# Patient Record
Sex: Female | Born: 1965 | Race: White | Hispanic: No | Marital: Married | Smoking: Former smoker
Health system: Southern US, Community
[De-identification: ages and names within clinical notes are randomized; demographics above are authoritative.]

## PROBLEM LIST (undated history)

## (undated) DIAGNOSIS — R0602 Shortness of breath: Secondary | ICD-10-CM

## (undated) DIAGNOSIS — T7840XA Allergy, unspecified, initial encounter: Secondary | ICD-10-CM

## (undated) DIAGNOSIS — K859 Acute pancreatitis without necrosis or infection, unspecified: Secondary | ICD-10-CM

## (undated) DIAGNOSIS — F329 Major depressive disorder, single episode, unspecified: Secondary | ICD-10-CM

## (undated) DIAGNOSIS — M549 Dorsalgia, unspecified: Secondary | ICD-10-CM

## (undated) DIAGNOSIS — R000 Tachycardia, unspecified: Secondary | ICD-10-CM

## (undated) DIAGNOSIS — F419 Anxiety disorder, unspecified: Secondary | ICD-10-CM

## (undated) DIAGNOSIS — J45909 Unspecified asthma, uncomplicated: Secondary | ICD-10-CM

## (undated) DIAGNOSIS — M1990 Unspecified osteoarthritis, unspecified site: Secondary | ICD-10-CM

## (undated) DIAGNOSIS — M2550 Pain in unspecified joint: Secondary | ICD-10-CM

## (undated) DIAGNOSIS — R0600 Dyspnea, unspecified: Secondary | ICD-10-CM

## (undated) DIAGNOSIS — E785 Hyperlipidemia, unspecified: Secondary | ICD-10-CM

## (undated) DIAGNOSIS — N83209 Unspecified ovarian cyst, unspecified side: Secondary | ICD-10-CM

## (undated) DIAGNOSIS — H269 Unspecified cataract: Secondary | ICD-10-CM

## (undated) DIAGNOSIS — Z87442 Personal history of urinary calculi: Secondary | ICD-10-CM

## (undated) DIAGNOSIS — R002 Palpitations: Secondary | ICD-10-CM

## (undated) DIAGNOSIS — Z9889 Other specified postprocedural states: Secondary | ICD-10-CM

## (undated) DIAGNOSIS — L409 Psoriasis, unspecified: Secondary | ICD-10-CM

## (undated) DIAGNOSIS — I10 Essential (primary) hypertension: Secondary | ICD-10-CM

## (undated) DIAGNOSIS — K589 Irritable bowel syndrome without diarrhea: Secondary | ICD-10-CM

## (undated) DIAGNOSIS — N6019 Diffuse cystic mastopathy of unspecified breast: Secondary | ICD-10-CM

## (undated) DIAGNOSIS — K219 Gastro-esophageal reflux disease without esophagitis: Secondary | ICD-10-CM

## (undated) DIAGNOSIS — J449 Chronic obstructive pulmonary disease, unspecified: Secondary | ICD-10-CM

## (undated) DIAGNOSIS — R112 Nausea with vomiting, unspecified: Secondary | ICD-10-CM

## (undated) DIAGNOSIS — Z8719 Personal history of other diseases of the digestive system: Secondary | ICD-10-CM

## (undated) DIAGNOSIS — L4050 Arthropathic psoriasis, unspecified: Principal | ICD-10-CM

## (undated) DIAGNOSIS — E039 Hypothyroidism, unspecified: Secondary | ICD-10-CM

## (undated) DIAGNOSIS — F32A Depression, unspecified: Secondary | ICD-10-CM

## (undated) HISTORY — DX: Chronic obstructive pulmonary disease, unspecified: J44.9

## (undated) HISTORY — DX: Unspecified osteoarthritis, unspecified site: M19.90

## (undated) HISTORY — DX: Hypothyroidism, unspecified: E03.9

## (undated) HISTORY — PX: TUBAL LIGATION: SHX77

## (undated) HISTORY — PX: EYE SURGERY: SHX253

## (undated) HISTORY — DX: Allergy, unspecified, initial encounter: T78.40XA

## (undated) HISTORY — DX: Essential (primary) hypertension: I10

## (undated) HISTORY — DX: Shortness of breath: R06.02

## (undated) HISTORY — PX: PARTIAL HYSTERECTOMY: SHX80

## (undated) HISTORY — DX: Gastro-esophageal reflux disease without esophagitis: K21.9

## (undated) HISTORY — DX: Tachycardia, unspecified: R00.0

## (undated) HISTORY — DX: Diffuse cystic mastopathy of unspecified breast: N60.19

## (undated) HISTORY — PX: ANKLE RECONSTRUCTION: SHX1151

## (undated) HISTORY — DX: Palpitations: R00.2

## (undated) HISTORY — DX: Unspecified ovarian cyst, unspecified side: N83.209

## (undated) HISTORY — PX: COLONOSCOPY: SHX174

## (undated) HISTORY — DX: Dorsalgia, unspecified: M54.9

## (undated) HISTORY — PX: POLYPECTOMY: SHX149

## (undated) HISTORY — DX: Acute pancreatitis without necrosis or infection, unspecified: K85.90

## (undated) HISTORY — DX: Hyperlipidemia, unspecified: E78.5

## (undated) HISTORY — DX: Anxiety disorder, unspecified: F41.9

## (undated) HISTORY — PX: KNEE SURGERY: SHX244

## (undated) HISTORY — DX: Arthropathic psoriasis, unspecified: L40.50

## (undated) HISTORY — PX: NASAL SEPTUM SURGERY: SHX37

## (undated) HISTORY — DX: Pain in unspecified joint: M25.50

## (undated) HISTORY — PX: ABDOMINAL HYSTERECTOMY: SHX81

## (undated) HISTORY — PX: FRACTURE SURGERY: SHX138

## (undated) HISTORY — DX: Psoriasis, unspecified: L40.9

## (undated) HISTORY — PX: NISSEN FUNDOPLICATION: SHX2091

## (undated) HISTORY — PX: CARPAL TUNNEL RELEASE: SHX101

## (undated) HISTORY — PX: OTHER SURGICAL HISTORY: SHX169

## (undated) HISTORY — PX: UPPER GASTROINTESTINAL ENDOSCOPY: SHX188

## (undated) HISTORY — DX: Unspecified cataract: H26.9

## (undated) HISTORY — DX: Irritable bowel syndrome, unspecified: K58.9

---

## 1998-01-25 ENCOUNTER — Encounter: Admission: RE | Admit: 1998-01-25 | Discharge: 1998-04-25 | Payer: Self-pay | Admitting: General Surgery

## 2000-05-30 ENCOUNTER — Observation Stay (HOSPITAL_COMMUNITY): Admission: EM | Admit: 2000-05-30 | Discharge: 2000-05-31 | Payer: Self-pay | Admitting: Emergency Medicine

## 2000-05-30 ENCOUNTER — Encounter: Payer: Self-pay | Admitting: Family Medicine

## 2000-05-31 ENCOUNTER — Encounter: Payer: Self-pay | Admitting: *Deleted

## 2000-10-11 ENCOUNTER — Encounter: Payer: Self-pay | Admitting: Gastroenterology

## 2000-10-11 ENCOUNTER — Encounter: Admission: RE | Admit: 2000-10-11 | Discharge: 2000-10-11 | Payer: Self-pay | Admitting: Gastroenterology

## 2001-03-04 ENCOUNTER — Encounter: Admission: RE | Admit: 2001-03-04 | Discharge: 2001-03-04 | Payer: Self-pay | Admitting: Family Medicine

## 2001-03-04 ENCOUNTER — Encounter: Payer: Self-pay | Admitting: Family Medicine

## 2001-04-24 ENCOUNTER — Encounter (INDEPENDENT_AMBULATORY_CARE_PROVIDER_SITE_OTHER): Payer: Self-pay | Admitting: *Deleted

## 2001-04-24 ENCOUNTER — Ambulatory Visit (HOSPITAL_COMMUNITY): Admission: RE | Admit: 2001-04-24 | Discharge: 2001-04-24 | Payer: Self-pay | Admitting: Gastroenterology

## 2001-04-29 ENCOUNTER — Encounter: Admission: RE | Admit: 2001-04-29 | Discharge: 2001-04-29 | Payer: Self-pay | Admitting: Gastroenterology

## 2001-04-29 ENCOUNTER — Encounter: Payer: Self-pay | Admitting: Gastroenterology

## 2001-05-08 ENCOUNTER — Encounter: Payer: Self-pay | Admitting: Family Medicine

## 2001-05-08 ENCOUNTER — Encounter: Admission: RE | Admit: 2001-05-08 | Discharge: 2001-05-08 | Payer: Self-pay | Admitting: Family Medicine

## 2001-05-13 ENCOUNTER — Encounter: Admission: RE | Admit: 2001-05-13 | Discharge: 2001-05-13 | Payer: Self-pay | Admitting: Family Medicine

## 2001-05-13 ENCOUNTER — Encounter: Payer: Self-pay | Admitting: Family Medicine

## 2001-06-10 ENCOUNTER — Encounter (INDEPENDENT_AMBULATORY_CARE_PROVIDER_SITE_OTHER): Payer: Self-pay | Admitting: *Deleted

## 2001-06-10 ENCOUNTER — Ambulatory Visit (HOSPITAL_COMMUNITY): Admission: RE | Admit: 2001-06-10 | Discharge: 2001-06-10 | Payer: Self-pay | Admitting: Gastroenterology

## 2001-07-24 ENCOUNTER — Encounter (INDEPENDENT_AMBULATORY_CARE_PROVIDER_SITE_OTHER): Payer: Self-pay | Admitting: *Deleted

## 2001-07-24 ENCOUNTER — Inpatient Hospital Stay (HOSPITAL_COMMUNITY): Admission: RE | Admit: 2001-07-24 | Discharge: 2001-07-26 | Payer: Self-pay | Admitting: *Deleted

## 2001-10-30 ENCOUNTER — Encounter: Admission: RE | Admit: 2001-10-30 | Discharge: 2001-10-30 | Payer: Self-pay | Admitting: Cardiology

## 2001-11-05 ENCOUNTER — Encounter: Admission: RE | Admit: 2001-11-05 | Discharge: 2001-11-05 | Payer: Self-pay

## 2002-03-28 ENCOUNTER — Encounter: Admission: RE | Admit: 2002-03-28 | Discharge: 2002-03-28 | Payer: Self-pay

## 2002-05-20 ENCOUNTER — Encounter: Admission: RE | Admit: 2002-05-20 | Discharge: 2002-05-20 | Payer: Self-pay | Admitting: Nephrology

## 2002-05-20 ENCOUNTER — Encounter: Payer: Self-pay | Admitting: Nephrology

## 2002-06-11 ENCOUNTER — Inpatient Hospital Stay (HOSPITAL_COMMUNITY): Admission: AD | Admit: 2002-06-11 | Discharge: 2002-06-12 | Payer: Self-pay | Admitting: Gastroenterology

## 2002-06-12 ENCOUNTER — Encounter (INDEPENDENT_AMBULATORY_CARE_PROVIDER_SITE_OTHER): Payer: Self-pay | Admitting: *Deleted

## 2002-06-12 ENCOUNTER — Encounter: Payer: Self-pay | Admitting: Gastroenterology

## 2002-06-23 ENCOUNTER — Inpatient Hospital Stay (HOSPITAL_COMMUNITY): Admission: AD | Admit: 2002-06-23 | Discharge: 2002-06-26 | Payer: Self-pay | Admitting: Gastroenterology

## 2002-06-24 ENCOUNTER — Encounter: Payer: Self-pay | Admitting: Gastroenterology

## 2002-06-26 ENCOUNTER — Encounter (INDEPENDENT_AMBULATORY_CARE_PROVIDER_SITE_OTHER): Payer: Self-pay | Admitting: *Deleted

## 2002-07-03 ENCOUNTER — Inpatient Hospital Stay (HOSPITAL_COMMUNITY): Admission: AD | Admit: 2002-07-03 | Discharge: 2002-07-08 | Payer: Self-pay | Admitting: Gastroenterology

## 2002-07-04 ENCOUNTER — Encounter: Payer: Self-pay | Admitting: Gastroenterology

## 2002-08-08 ENCOUNTER — Encounter (INDEPENDENT_AMBULATORY_CARE_PROVIDER_SITE_OTHER): Payer: Self-pay | Admitting: *Deleted

## 2002-09-10 ENCOUNTER — Ambulatory Visit (HOSPITAL_COMMUNITY): Admission: RE | Admit: 2002-09-10 | Discharge: 2002-09-10 | Payer: Self-pay | Admitting: Internal Medicine

## 2002-09-10 ENCOUNTER — Encounter: Payer: Self-pay | Admitting: Internal Medicine

## 2002-10-29 ENCOUNTER — Encounter: Payer: Self-pay | Admitting: Nephrology

## 2002-10-29 ENCOUNTER — Encounter: Admission: RE | Admit: 2002-10-29 | Discharge: 2002-10-29 | Payer: Self-pay | Admitting: Nephrology

## 2003-01-23 ENCOUNTER — Encounter: Payer: Self-pay | Admitting: Internal Medicine

## 2003-01-23 ENCOUNTER — Ambulatory Visit (HOSPITAL_COMMUNITY): Admission: RE | Admit: 2003-01-23 | Discharge: 2003-01-23 | Payer: Self-pay | Admitting: Internal Medicine

## 2003-03-05 ENCOUNTER — Encounter: Admission: RE | Admit: 2003-03-05 | Discharge: 2003-03-05 | Payer: Self-pay | Admitting: Gastroenterology

## 2003-03-05 ENCOUNTER — Encounter: Payer: Self-pay | Admitting: Gastroenterology

## 2003-03-13 ENCOUNTER — Encounter: Payer: Self-pay | Admitting: Gastroenterology

## 2003-03-13 ENCOUNTER — Encounter: Admission: RE | Admit: 2003-03-13 | Discharge: 2003-03-13 | Payer: Self-pay | Admitting: Gastroenterology

## 2003-03-20 ENCOUNTER — Encounter: Admission: RE | Admit: 2003-03-20 | Discharge: 2003-03-20 | Payer: Self-pay | Admitting: Nephrology

## 2003-03-20 ENCOUNTER — Encounter: Payer: Self-pay | Admitting: Nephrology

## 2003-05-25 ENCOUNTER — Emergency Department (HOSPITAL_COMMUNITY): Admission: EM | Admit: 2003-05-25 | Discharge: 2003-05-25 | Payer: Self-pay

## 2003-06-03 ENCOUNTER — Other Ambulatory Visit: Admission: RE | Admit: 2003-06-03 | Discharge: 2003-06-03 | Payer: Self-pay | Admitting: Obstetrics and Gynecology

## 2003-07-16 ENCOUNTER — Encounter (INDEPENDENT_AMBULATORY_CARE_PROVIDER_SITE_OTHER): Payer: Self-pay | Admitting: Specialist

## 2003-07-16 ENCOUNTER — Observation Stay (HOSPITAL_COMMUNITY): Admission: RE | Admit: 2003-07-16 | Discharge: 2003-07-17 | Payer: Self-pay | Admitting: Obstetrics and Gynecology

## 2003-11-02 ENCOUNTER — Encounter: Admission: RE | Admit: 2003-11-02 | Discharge: 2003-11-02 | Payer: Self-pay | Admitting: Nephrology

## 2003-12-10 ENCOUNTER — Encounter: Admission: RE | Admit: 2003-12-10 | Discharge: 2003-12-10 | Payer: Self-pay | Admitting: Gastroenterology

## 2004-04-01 ENCOUNTER — Encounter (INDEPENDENT_AMBULATORY_CARE_PROVIDER_SITE_OTHER): Payer: Self-pay | Admitting: *Deleted

## 2004-04-01 ENCOUNTER — Encounter (INDEPENDENT_AMBULATORY_CARE_PROVIDER_SITE_OTHER): Payer: Self-pay | Admitting: Specialist

## 2004-04-01 ENCOUNTER — Inpatient Hospital Stay (HOSPITAL_COMMUNITY): Admission: EM | Admit: 2004-04-01 | Discharge: 2004-04-05 | Payer: Self-pay | Admitting: Emergency Medicine

## 2004-04-05 ENCOUNTER — Encounter (INDEPENDENT_AMBULATORY_CARE_PROVIDER_SITE_OTHER): Payer: Self-pay | Admitting: *Deleted

## 2004-04-06 ENCOUNTER — Ambulatory Visit (HOSPITAL_COMMUNITY): Admission: RE | Admit: 2004-04-06 | Discharge: 2004-04-06 | Payer: Self-pay | Admitting: Pediatrics

## 2004-04-06 ENCOUNTER — Encounter (INDEPENDENT_AMBULATORY_CARE_PROVIDER_SITE_OTHER): Payer: Self-pay | Admitting: *Deleted

## 2004-04-13 ENCOUNTER — Encounter (INDEPENDENT_AMBULATORY_CARE_PROVIDER_SITE_OTHER): Payer: Self-pay | Admitting: Specialist

## 2004-04-13 ENCOUNTER — Encounter (INDEPENDENT_AMBULATORY_CARE_PROVIDER_SITE_OTHER): Payer: Self-pay | Admitting: *Deleted

## 2004-04-13 ENCOUNTER — Ambulatory Visit (HOSPITAL_COMMUNITY): Admission: RE | Admit: 2004-04-13 | Discharge: 2004-04-13 | Payer: Self-pay | Admitting: Gastroenterology

## 2004-04-18 ENCOUNTER — Encounter (INDEPENDENT_AMBULATORY_CARE_PROVIDER_SITE_OTHER): Payer: Self-pay | Admitting: *Deleted

## 2004-04-18 ENCOUNTER — Ambulatory Visit (HOSPITAL_COMMUNITY): Admission: RE | Admit: 2004-04-18 | Discharge: 2004-04-18 | Payer: Self-pay | Admitting: Gastroenterology

## 2004-06-03 ENCOUNTER — Other Ambulatory Visit: Admission: RE | Admit: 2004-06-03 | Discharge: 2004-06-03 | Payer: Self-pay | Admitting: Obstetrics and Gynecology

## 2004-07-04 ENCOUNTER — Encounter: Admission: RE | Admit: 2004-07-04 | Discharge: 2004-07-04 | Payer: Self-pay | Admitting: Nephrology

## 2004-08-11 ENCOUNTER — Encounter: Admission: RE | Admit: 2004-08-11 | Discharge: 2004-08-11 | Payer: Self-pay | Admitting: Nephrology

## 2004-11-16 ENCOUNTER — Encounter: Admission: RE | Admit: 2004-11-16 | Discharge: 2004-11-16 | Payer: Self-pay | Admitting: Gastroenterology

## 2004-11-16 ENCOUNTER — Encounter (INDEPENDENT_AMBULATORY_CARE_PROVIDER_SITE_OTHER): Payer: Self-pay | Admitting: *Deleted

## 2004-12-16 ENCOUNTER — Encounter (INDEPENDENT_AMBULATORY_CARE_PROVIDER_SITE_OTHER): Payer: Self-pay | Admitting: *Deleted

## 2004-12-16 ENCOUNTER — Encounter: Admission: RE | Admit: 2004-12-16 | Discharge: 2004-12-16 | Payer: Self-pay | Admitting: Nephrology

## 2004-12-30 ENCOUNTER — Encounter: Admission: RE | Admit: 2004-12-30 | Discharge: 2004-12-30 | Payer: Self-pay

## 2005-02-13 ENCOUNTER — Encounter: Admission: RE | Admit: 2005-02-13 | Discharge: 2005-02-13 | Payer: Self-pay | Admitting: Nephrology

## 2005-05-06 ENCOUNTER — Emergency Department: Payer: Self-pay | Admitting: Emergency Medicine

## 2005-05-06 ENCOUNTER — Other Ambulatory Visit: Payer: Self-pay

## 2005-09-05 ENCOUNTER — Other Ambulatory Visit: Admission: RE | Admit: 2005-09-05 | Discharge: 2005-09-05 | Payer: Self-pay | Admitting: Obstetrics and Gynecology

## 2005-10-26 ENCOUNTER — Encounter: Admission: RE | Admit: 2005-10-26 | Discharge: 2005-10-26 | Payer: Self-pay | Admitting: Nephrology

## 2005-10-26 ENCOUNTER — Encounter (INDEPENDENT_AMBULATORY_CARE_PROVIDER_SITE_OTHER): Payer: Self-pay | Admitting: *Deleted

## 2005-10-30 ENCOUNTER — Encounter: Admission: RE | Admit: 2005-10-30 | Discharge: 2005-10-30 | Payer: Self-pay | Admitting: Gastroenterology

## 2005-10-30 ENCOUNTER — Encounter (INDEPENDENT_AMBULATORY_CARE_PROVIDER_SITE_OTHER): Payer: Self-pay | Admitting: *Deleted

## 2005-11-02 ENCOUNTER — Ambulatory Visit (HOSPITAL_COMMUNITY): Admission: RE | Admit: 2005-11-02 | Discharge: 2005-11-02 | Payer: Self-pay | Admitting: Gastroenterology

## 2005-11-02 ENCOUNTER — Encounter (INDEPENDENT_AMBULATORY_CARE_PROVIDER_SITE_OTHER): Payer: Self-pay | Admitting: *Deleted

## 2005-11-06 ENCOUNTER — Encounter: Admission: RE | Admit: 2005-11-06 | Discharge: 2005-11-06 | Payer: Self-pay | Admitting: Gastroenterology

## 2005-11-06 ENCOUNTER — Encounter (INDEPENDENT_AMBULATORY_CARE_PROVIDER_SITE_OTHER): Payer: Self-pay | Admitting: *Deleted

## 2005-11-09 ENCOUNTER — Encounter: Admission: RE | Admit: 2005-11-09 | Discharge: 2005-11-09 | Payer: Self-pay | Admitting: Nephrology

## 2006-07-05 ENCOUNTER — Encounter: Admission: RE | Admit: 2006-07-05 | Discharge: 2006-07-05 | Payer: Self-pay | Admitting: Orthopedic Surgery

## 2006-09-18 ENCOUNTER — Encounter: Admission: RE | Admit: 2006-09-18 | Discharge: 2006-09-18 | Payer: Self-pay | Admitting: Nephrology

## 2006-10-02 ENCOUNTER — Encounter: Admission: RE | Admit: 2006-10-02 | Discharge: 2006-10-02 | Payer: Self-pay | Admitting: Gastroenterology

## 2006-10-02 ENCOUNTER — Encounter (INDEPENDENT_AMBULATORY_CARE_PROVIDER_SITE_OTHER): Payer: Self-pay | Admitting: *Deleted

## 2006-10-10 ENCOUNTER — Other Ambulatory Visit: Admission: RE | Admit: 2006-10-10 | Discharge: 2006-10-10 | Payer: Self-pay | Admitting: Obstetrics and Gynecology

## 2006-11-12 ENCOUNTER — Encounter: Admission: RE | Admit: 2006-11-12 | Discharge: 2006-11-12 | Payer: Self-pay | Admitting: Nephrology

## 2007-01-08 ENCOUNTER — Inpatient Hospital Stay (HOSPITAL_COMMUNITY): Admission: AD | Admit: 2007-01-08 | Discharge: 2007-01-10 | Payer: Self-pay | Admitting: Gastroenterology

## 2007-01-08 ENCOUNTER — Encounter: Admission: RE | Admit: 2007-01-08 | Discharge: 2007-01-08 | Payer: Self-pay | Admitting: Nephrology

## 2007-01-08 ENCOUNTER — Encounter (INDEPENDENT_AMBULATORY_CARE_PROVIDER_SITE_OTHER): Payer: Self-pay | Admitting: *Deleted

## 2007-01-10 ENCOUNTER — Encounter (INDEPENDENT_AMBULATORY_CARE_PROVIDER_SITE_OTHER): Payer: Self-pay | Admitting: *Deleted

## 2007-10-14 ENCOUNTER — Other Ambulatory Visit: Admission: RE | Admit: 2007-10-14 | Discharge: 2007-10-14 | Payer: Self-pay | Admitting: Obstetrics and Gynecology

## 2007-11-13 ENCOUNTER — Encounter: Admission: RE | Admit: 2007-11-13 | Discharge: 2007-11-13 | Payer: Self-pay | Admitting: Obstetrics and Gynecology

## 2008-06-05 ENCOUNTER — Ambulatory Visit: Payer: Self-pay | Admitting: Women's Health

## 2008-06-10 ENCOUNTER — Encounter: Admission: RE | Admit: 2008-06-10 | Discharge: 2008-06-10 | Payer: Self-pay | Admitting: Obstetrics and Gynecology

## 2008-10-22 ENCOUNTER — Encounter: Payer: Self-pay | Admitting: Obstetrics and Gynecology

## 2008-10-22 ENCOUNTER — Other Ambulatory Visit: Admission: RE | Admit: 2008-10-22 | Discharge: 2008-10-22 | Payer: Self-pay | Admitting: Obstetrics and Gynecology

## 2008-10-22 ENCOUNTER — Ambulatory Visit: Payer: Self-pay | Admitting: Obstetrics and Gynecology

## 2008-11-16 ENCOUNTER — Encounter: Admission: RE | Admit: 2008-11-16 | Discharge: 2008-11-16 | Payer: Self-pay | Admitting: Obstetrics and Gynecology

## 2009-06-21 ENCOUNTER — Encounter (INDEPENDENT_AMBULATORY_CARE_PROVIDER_SITE_OTHER): Payer: Self-pay | Admitting: *Deleted

## 2009-06-21 ENCOUNTER — Ambulatory Visit (HOSPITAL_COMMUNITY): Admission: RE | Admit: 2009-06-21 | Discharge: 2009-06-21 | Payer: Self-pay | Admitting: Nephrology

## 2009-08-23 ENCOUNTER — Encounter (INDEPENDENT_AMBULATORY_CARE_PROVIDER_SITE_OTHER): Payer: Self-pay | Admitting: *Deleted

## 2009-08-24 DIAGNOSIS — E039 Hypothyroidism, unspecified: Secondary | ICD-10-CM | POA: Insufficient documentation

## 2009-08-24 DIAGNOSIS — Z8719 Personal history of other diseases of the digestive system: Secondary | ICD-10-CM | POA: Insufficient documentation

## 2009-08-24 DIAGNOSIS — Z8679 Personal history of other diseases of the circulatory system: Secondary | ICD-10-CM | POA: Insufficient documentation

## 2009-08-24 DIAGNOSIS — E785 Hyperlipidemia, unspecified: Secondary | ICD-10-CM | POA: Insufficient documentation

## 2009-08-24 DIAGNOSIS — I10 Essential (primary) hypertension: Secondary | ICD-10-CM | POA: Insufficient documentation

## 2009-08-24 DIAGNOSIS — K219 Gastro-esophageal reflux disease without esophagitis: Secondary | ICD-10-CM | POA: Insufficient documentation

## 2009-08-24 DIAGNOSIS — N83209 Unspecified ovarian cyst, unspecified side: Secondary | ICD-10-CM | POA: Insufficient documentation

## 2009-08-24 DIAGNOSIS — K589 Irritable bowel syndrome without diarrhea: Secondary | ICD-10-CM | POA: Insufficient documentation

## 2009-08-24 DIAGNOSIS — M129 Arthropathy, unspecified: Secondary | ICD-10-CM | POA: Insufficient documentation

## 2009-08-24 DIAGNOSIS — K5190 Ulcerative colitis, unspecified, without complications: Secondary | ICD-10-CM | POA: Insufficient documentation

## 2009-08-24 DIAGNOSIS — Z87898 Personal history of other specified conditions: Secondary | ICD-10-CM | POA: Insufficient documentation

## 2009-08-24 DIAGNOSIS — E7849 Other hyperlipidemia: Secondary | ICD-10-CM | POA: Insufficient documentation

## 2009-08-24 DIAGNOSIS — L408 Other psoriasis: Secondary | ICD-10-CM | POA: Insufficient documentation

## 2009-08-30 ENCOUNTER — Ambulatory Visit: Payer: Self-pay | Admitting: Internal Medicine

## 2009-09-09 ENCOUNTER — Ambulatory Visit: Payer: Self-pay | Admitting: Internal Medicine

## 2009-09-14 ENCOUNTER — Encounter: Payer: Self-pay | Admitting: Internal Medicine

## 2009-09-15 ENCOUNTER — Telehealth: Payer: Self-pay | Admitting: Internal Medicine

## 2009-09-23 ENCOUNTER — Encounter: Payer: Self-pay | Admitting: Internal Medicine

## 2009-10-14 ENCOUNTER — Ambulatory Visit: Payer: Self-pay | Admitting: Internal Medicine

## 2009-10-21 ENCOUNTER — Encounter: Payer: Self-pay | Admitting: Internal Medicine

## 2009-10-27 ENCOUNTER — Other Ambulatory Visit: Admission: RE | Admit: 2009-10-27 | Discharge: 2009-10-27 | Payer: Self-pay | Admitting: Obstetrics and Gynecology

## 2009-10-27 ENCOUNTER — Ambulatory Visit: Payer: Self-pay | Admitting: Obstetrics and Gynecology

## 2009-11-03 ENCOUNTER — Encounter: Admission: RE | Admit: 2009-11-03 | Discharge: 2009-11-03 | Payer: Self-pay | Admitting: Rheumatology

## 2009-11-25 ENCOUNTER — Ambulatory Visit: Payer: Self-pay | Admitting: Internal Medicine

## 2009-11-29 ENCOUNTER — Encounter: Payer: Self-pay | Admitting: Internal Medicine

## 2009-12-06 ENCOUNTER — Encounter (HOSPITAL_COMMUNITY): Admission: RE | Admit: 2009-12-06 | Discharge: 2010-03-06 | Payer: Self-pay | Admitting: Rheumatology

## 2009-12-21 ENCOUNTER — Encounter: Payer: Self-pay | Admitting: Internal Medicine

## 2009-12-27 ENCOUNTER — Encounter: Admission: RE | Admit: 2009-12-27 | Discharge: 2009-12-27 | Payer: Self-pay | Admitting: Obstetrics and Gynecology

## 2010-01-24 ENCOUNTER — Telehealth: Payer: Self-pay | Admitting: Internal Medicine

## 2010-02-28 ENCOUNTER — Ambulatory Visit: Payer: Self-pay | Admitting: Internal Medicine

## 2010-04-06 ENCOUNTER — Encounter: Payer: Self-pay | Admitting: Internal Medicine

## 2010-04-12 ENCOUNTER — Encounter: Payer: Self-pay | Admitting: Internal Medicine

## 2010-04-14 ENCOUNTER — Encounter (HOSPITAL_COMMUNITY): Admission: RE | Admit: 2010-04-14 | Discharge: 2010-06-16 | Payer: Self-pay | Admitting: Rheumatology

## 2010-05-22 ENCOUNTER — Observation Stay (HOSPITAL_COMMUNITY): Admission: EM | Admit: 2010-05-22 | Discharge: 2010-05-22 | Payer: Self-pay | Admitting: Emergency Medicine

## 2010-06-17 ENCOUNTER — Encounter (HOSPITAL_COMMUNITY)
Admission: RE | Admit: 2010-06-17 | Discharge: 2010-08-16 | Payer: Self-pay | Source: Home / Self Care | Attending: Rheumatology | Admitting: Rheumatology

## 2010-07-07 ENCOUNTER — Encounter: Payer: Self-pay | Admitting: Internal Medicine

## 2010-08-03 ENCOUNTER — Ambulatory Visit: Admit: 2010-08-03 | Payer: Self-pay | Admitting: Internal Medicine

## 2010-08-06 ENCOUNTER — Encounter: Payer: Self-pay | Admitting: Nephrology

## 2010-08-16 NOTE — Discharge Summary (Signed)
Summary: Relapsing Pancreatitis     NAME:  [name], [name]                      ACCOUNT NO.:  [id number]   MEDICAL RECORD NO.:  [id number]                   PATIENT TYPE:  INP   LOCATION:  5522                                 FACILITY:  [facility]   PHYSICIAN:  James L. Rolla Flatten., M.D.          DATE OF BIRTH:  January 16, 1966   DATE OF ADMISSION:  07/03/2002  DATE OF DISCHARGE:  07/08/2002                                 DISCHARGE SUMMARY   ADMISSION DIAGNOSES:  1. Acute relapsing pancreatitis secondary to Imuran.  2. Ulcerative colitis with multiple extracolonic manifestations.   DISCHARGE DIAGNOSES:  1. Acute relapsing pancreatitis secondary to Imuran.  2. Ulcerative colitis with multiple extracolonic manifestations.   PERTINENT HISTORY:  A 45 year old with ulcerative colitis diagnosed in 2003.  Had been on steroids and was started on Imuran after she failed to improve  with steroid taper.  Within seven to eight days of starting the  Imuran, she  developed acute pancreatitis.  Workup including CT and ultrasound failed to  show any other obvious cause.  She was hospitalized for this and got better  and went home on clear liquids with slow increase of her diet.  She took  some bouillon and became sick and had abdominal  pain and vomiting.  Amylase  and lipase bumped back up.   MEDICATIONS ON ADMISSION:  1. Colazal.  2. Vicodin.  3. Ultram.  4. Prednisone 5 mg alternating with 7.5.  5. Levothyroxine 25 mcg daily.   PHYSICAL EXAMINATION:  Remarkable for normal heart and lungs.  Abdomen was  nondistended.  Bowel sounds were active.  There  was marked epigastric  tenderness.  For more details, please see dictated admission history and  physical.   HOSPITAL COURSE:  The patient was admitted to medical floor and kept n.p.o.  Started on IV fluids.  We went ahead and made arrangements to place a PIC  line which was placed in radiology.  She was seen by pharmacy and  arrangements were  made to start TNA.  She was followed by pharmacy and the  formula was adjusted.  TNA began to be cycled at night with the patient  allowed to have sips of clear liquids with no bouillon or any pat  whatsoever.  Arrangements were made with home health care service of the  case manager to have the TNA delivered to the house.  It would be cycled at  night.  The patient was marked improved.   DISPOSITION:  The patient will be discharged home on TNA arranged by  [facility].  She will be on clear liquids, fat free.   MEDICATIONS:  1. Prednisone daily 5 mg.  On 12/29, she will decrease to 2.5 mg on odd days     and 5 mg on even days.  2. She will take Vicodin for pain.  3.     Phenergan for nausea.  4. Protonix 40 mg daily.   See will see Dr. Oletta Lamas in the office on 1/6.  Condition is improved.                                               James L. Rolla Flatten., M.D.    [name]  D:  08/12/2002  T:  08/12/2002  Job:  [id number]   cc:   Jeneen Rinks L. Deterding, M.D.  [place] [address]  Fax: [phone]

## 2010-08-16 NOTE — Miscellaneous (Signed)
Summary: Asacol Refills  Clinical Lists Changes  Medications: Changed medication from ASACOL 400 MG TBEC (MESALAMINE) Take 2 tablets by mouth three times a day x 4 days, then increase to 3 tablets by mouth three times a day to ASACOL 400 MG TBEC (MESALAMINE) Take 3 tablets by mouth three times a day - Signed Rx of ASACOL 400 MG TBEC (MESALAMINE) Take 3 tablets by mouth three times a day;  #270 x 3;  Signed;  Entered by: [name] CMA (AAMA);  Authorized by: Lafayette Dragon MD;  Method used: Electronically to [facility] [address] [place], [place], [place]  [address], Ph: [phone], Fax: [phone]    Prescriptions: ASACOL 400 MG TBEC (MESALAMINE) Take 3 tablets by mouth three times a day  #270 x 3   Entered by:   [name] CMA ([place])   Authorized by:   Lafayette Dragon MD   Signed by:   [name] CMA ([place]) on 09/23/2009   Method used:   Electronically to        [facility] (retail)       [address]       [place], [place]  [address]       Ph: [phone]       Fax: [phone]   RxID:   [id number]

## 2010-08-16 NOTE — Assessment & Plan Note (Signed)
Summary: ulcerative colitis flare...as.   History of Present Illness Visit Type: consult Primary GI MD: [name] MD Primary Provider: n/a Requesting Provider: Mauricia Area, MD Chief Complaint: abdominal pain, intermittent diarrhea, blood and mucus in BM History of Present Illness:   [name] with ulcerative colitis diagnosed 2002 with a colonoscopy showing left-sided ulcerative colitis. She has since then had several hospitalizations and was in a remission for several years until recently when she developed crampy abdominal pain and almost daily rectal bleeding as well as frequent stools. Her last colonoscopy in September 2005 was essentially normal. Her sister has had Crohn's disease since age 86 and her mother has a history of colon polyps. This is a significant history of gastroesophageal reflux and esophageal ulcers necessitating a Nissen fundoplication in January 5456. Patient is unable to vomit as a result of the procedure. This became quite difficult when she was admitted with pancreatitis in November 2003. She had 3 subsequent hospitalizations for pancreatitis secondary to Imuran. In November 2003, a CT scan showed inflammatory changes in the tail of the pancreas. She was hospitalized for ulcerative colitis in September 2005 and started to cyclosporin for 2 years but because of renal insufficiency, the medication was discontinued. She was also tried on Humira 40 mg weekly, primarily for psoriasis, without much effect. The small bowel follow through in September 2005 was normal. Numerous CT scans of the abdomen for abdominal pain in May 2006, April 2007 and in June 2008 were all essentially negative. An MRCP in April 2007 was also negative. Patient denies using alcohol. Her gallbladder ultrasounds have been normal and her HIDA scan in December 2010 shows a 91% ejection fraction. She is currently on no medication for ulcerative colitis. She used to be on prednisone for  several years and was briefly on Imuran until she developed pancreatitis. Her weight has been stable. 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   GI Review of Systems    Reports abdominal pain, acid reflux, belching, bloating, and  heartburn.     Location of  Abdominal pain: generalized.    Denies chest pain, dysphagia with liquids, dysphagia with solids, loss of appetite, nausea, vomiting, vomiting blood, weight loss, and  weight gain.      Reports constipation, diarrhea, heme positive stool, hemorrhoids, irritable bowel syndrome, and  rectal bleeding.     Denies anal fissure, black tarry stools, change in bowel habit, diverticulosis, fecal incontinence, jaundice, light color stool, liver problems, and  rectal pain.    Current Medications (verified): 1)  Levothroid 100 Mcg Tabs (Levothyroxine Sodium) .... Take 1 Tablet By Mouth Once A Day 2)  Pravachol 40 Mg Tabs (Pravastatin Sodium) .... Take 1 Tablet By Mouth Once A Day 3)  Prozac 20 Mg Caps (Fluoxetine Hcl) .... Take 1 Tablet By Mouth Once A Day  Allergies: 1)  ! Erythromycin 2)  ! Demerol 3)  ! * Avelox 4)  ! * Imuran 5)  ! * Latex 6)  Doxycycline  Past History:  Past Medical History: Reviewed history from 08/24/2009 and no changes required. Current Problems:  IRRITABLE BOWEL SYNDROME (ICD-564.1) ARTHRITIS (ICD-716.90) TACHYCARDIA, HX OF (ICD-V12.50) FIBROCYSTIC BREAST DISEASE, HX OF (ICD-V13.9) GERD (ICD-530.81) PANCREATITIS, HX OF (ICD-V12.70) Hx of OVARIAN CYST (ICD-620.2) UNSPECIFIED HYPOTHYROIDISM (ICD-244.9) HYPERLIPIDEMIA (ICD-272.4) HYPERTENSION (ICD-401.9) PSORIASIS (ICD-696.1) ULCERATIVE COLITIS (ICD-556.9)    Past Surgical History: Reviewed history from 08/24/2009 and no changes required. 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 -quit in 2007 Alcohol Use - yes-rare Illicit Drug Use - no Daily Caffeine Use 3/day  Review of Systems       The patient complains of fatigue, night sweats, and skin rash.  The patient denies allergy/sinus, anemia, anxiety-new, arthritis/joint pain, back pain, blood in urine, breast changes/lumps, confusion, cough, coughing up blood, depression-new, fainting, fever, headaches-new, hearing problems, heart murmur, heart rhythm changes, itching, menstrual pain, muscle pains/cramps, nosebleeds, pregnancy symptoms, shortness of breath, sleeping problems, sore throat, swelling of feet/legs, swollen lymph glands, thirst - excessive, urination - excessive, urination changes/pain, urine leakage, vision changes, and voice change.         Pertinent positive and negative review of systems were noted in the above HPI. All other ROS was otherwise negative.   Vital Signs:  Patient profile:   45 year old female Height:      66 inches Weight:      182 pounds BMI:     29.48 Pulse rate:   84 / minute Pulse rhythm:   regular BP sitting:   120 / 76  (left arm) Cuff size:   regular  Vitals Entered By: Abelino Derrick CMA Deborra Medina) (August 30, 2009 1:35 PM)  Physical Exam  General:  alert, oriented and in no distress, appeared healthy and slightly cushingoid. Eyes:  PERRLA, no icterus. Mouth:  normal oral mucosa. Neck:  normal thyroid. Chest Wall:  Symmetrical;  no deformities or tenderness. Lungs:  Clear throughout to auscultation. Heart:  Regular rate and rhythm; no murmurs, rubs,  or bruits. Abdomen:  soft abdomen with some mild tenderness in the left lower quadrant and right lower  quadrant. There is no distention and there are normal bowel sounds. There is no fullness or rebound. There is no tympany. Liver edge at costal margin. Rectal:  rectal and anoscopic exam reveals friable bleeding mucosa of the rectum. There was no visible blood but stool was strongly Hemoccult positive. There were no significant hemorrhoids. Extremities:  No clubbing, cyanosis, edema or deformities noted. Skin:  psoriasis of the palms of her hands as well as of her feet. Psych:  Alert and cooperative. Normal mood and affect.   Impression & Recommendations:  Problem # 1:  ULCERATIVE COLITIS (ICD-556.9) Patient has ulcerative colitis of almost 10 years duration and is currently on no medications. She is having a  flareup presenting with bleeding, crampy abdominal pain and diarrhea. We will start her on Asacol 400 mg and slowly increase her to 3.6 g a day. We will also give her dicyclomine 20 mg 3 times a day for cramps. We may need to consider biologicals or possibly methotrexate or a short course of prednisone. We will proceed with a colonoscopy to assess the activity and the extent of the disease. Orders: Colon/Endo (Colon/Endo)  Problem # 2:  GERD (ICD-530.81) Patient is status post Nissen fundoplication with a  wrap around 50 [place]  unable to vomit.R/o gas/bloar syndrome. She is to continue on antireflux measures. Depending on the endoscopy, she may need PPIs. Orders: Colon/Endo (Colon/Endo)  Problem # 3:  ARTHRITIS (ICD-716.90) Patient has mild arthralgias possibly  due to psoriasis or due to IBD related arthropathy. She is on topical medications but currently is on no systemic treatment.  Problem # 4:  PANCREATITIS, HX OF (ICD-V12.70) Patient has a  history of pancreatitis due to Imuran requiring 3 separate hospitalizations in 2005. This is not currently active.  Problem # 5:  UNSPECIFIED HYPOTHYROIDISM (ICD-244.9) history of autoimmune thyroiditis.  Patient Instructions: 1)  low  residue diet. 2)  Dicyclomine 20 mg p.o. t.i.d. 3)  Asacol 400 mg increased to 9 tablets a day. 4)  Schedule colonoscopy with biopsies. 5)  Consider alternative treatment for ulcerative colitis such as methotrexate, Remicade or prednisone. 6)  Copy sent to : Dr Deterding 7)  The medication list was reviewed and reconciled.  All changed / newly prescribed medications were explained.  A complete medication list was provided to the patient / caregiver. Prescriptions: ASACOL 400 MG TBEC (MESALAMINE) Take 2 tablets by mouth three times a day x 4 days, then increase to 3 tablets by mouth three times a day  #120 x 1   Entered by:   [name] CMA ([place])   Authorized by:   Lafayette Dragon MD   Signed by:   [name] CMA ([place]) on 08/30/2009   Method used:   Electronically to        [facility] ([facility]ail)       [place], [place]  [address]       Ph: [phone]       Fax: [phone]   RxID:   [id number] DICYCLOMINE HCL 20 MG TABS (DICYCLOMINE HCL) Take 1 tablet by mouth three times a day  #90 x 3   Entered by:   [name] CMA ([place])   Authorized by:   Lafayette Dragon MD   Signed by:   [name] CMA ([place]) on 08/30/2009   Method used:   Electronically to        [facility] ([facility]ail)       [place], [place]  [address]       Ph: [phone]       Fax: [phone]   RxID:   [id number] DULCOLAX 5 MG  TBEC (BISACODYL) Day before procedure take 2 at 3pm and 2 at 8pm.  #4 x 0   Entered by:   [name] CMA ([place])   Authorized by:   Lafayette Dragon MD   Signed by:   [name] CMA ([place]) on 08/30/2009   Method used:   Electronically to        [facility] ([facility])       [place], [place]  [address]       Ph: [phone]       Fax: [phone]   RxID:   [id number] REGLAN 10 MG   TABS (METOCLOPRAMIDE HCL) As per prep instructions.  #2 x 0   Entered by:   [name] CMA (AAMA)   Authorized by:   Lafayette Dragon MD   Signed by:   [name] CMA ([place]) on 08/30/2009   Method used:   Electronically to        [facility] ([facility]ail)       [phone]  [place], [place]  [address]       Ph: [phone]       Fax: [phone]   RxID:   [id number] MIRALAX   POWD (POLYETHYLENE GLYCOL 3350) As per prep  instructions.  #255gm x 0   Entered by:   [name] CMA ([place])   Authorized by:   Lafayette Dragon MD   Signed by:   [name] CMA ([place]) on 08/30/2009   Method used:   Electronically to        [facility] ([facility])       [place], [place]  [address]       Ph: [phone]       Fax: [phone]   RxID:   [id number]

## 2010-08-16 NOTE — Op Note (Signed)
Summary: EGD (Dr Oletta Lamas)  NAME:  [name], [name]               ACCOUNT NO.:  [id number]   MEDICAL RECORD NO.:  [id number]          PATIENT TYPE:  AMB   LOCATION:  ENDO                         FACILITY:  [facility]   PHYSICIAN:  James L. Rolla Flatten., M.D.DATE OF BIRTH:  01-28-1966   DATE OF PROCEDURE:  DATE OF DISCHARGE:                                 OPERATIVE REPORT   PROCEDURE PERFORMED:  Esophagogastroduodenoscopy.   MEDICATIONS:  Fentanyl 100 mcg, Versed 10 mg IV, Cetacaine spray.   ENDOSCOPIST:  Joyice Faster. Oletta Lamas, M.D.   INDICATIONS FOR PROCEDURE:  Abdominal pain, mild pancreatitis, negative CT,  ultrasound.  The patient has had previous Nissen, has a multitude of  extracolonic manifestations of her ulcerative colitis.  This is done to rule  out any type of ulceration, slippage of Nissen, etc.   DESCRIPTION OF PROCEDURE:  The procedure had been explained to the patient  and consent obtained.  With the patient in left lateral decubitus position,  the Olympus scope was inserted and advanced.  The stomach was entered.  The  pylorus identified and passed.  The duodenum including the bulb and second  portion were seen well and were unremarkable.  No ulceration or  inflammation.  The pyloric channel, antrum and body were normal.  Fundus and  cardia seen well in the retroflex view.  The Nissen was seen and appeared to  be intact.  The distal proximal esophagus was seen well and was normal.  The  scope was withdrawn.  The patient tolerated the procedure well.   ASSESSMENT:  Epigastric pain with essentially negative endoscopy.  No  evidence of perforating duodenal ulcer etc.   PLAN:  Will continue supportive treatment for pancreatitis.  She will follow-  up with labs in Dr. Deterding's office next week.  We will arrange an MRCP  to look for signs of sclerosing cholangitis.           ______________________________  Joyice Faster. Rolla Flatten., M.D.     [name]  D:  11/02/2005  T:   11/03/2005  Job:  [id number]   cc:   Jeneen Rinks L. Deterding, M.D.  Fax: [phone]

## 2010-08-16 NOTE — Assessment & Plan Note (Signed)
Summary: ECL F-UP/YF   History of Present Illness Visit Type: Follow-up Visit Primary GI MD: [name] MD Primary Provider: Mauricia Area, MD Requesting Provider: n/a Chief Complaint: f/u ECL, pt still having occasional rectal bleeding, bloating. Pt has a increase in weight but pt is on prednisone.  History of Present Illness:   [name] with long-standing ulcerative colitis who is status post upper endoscopy and colonoscopy on 09/10/09 confirming the presence of moderately active ulcerative colitis throughout the entire colon. Random biopsies showed active inflammation. There were no granulomas. She has been on Asacol 400 mg 12 tablets per day and a prednisone taper starting at 30 mg daily and decreasing by 5 mg every 2 weeks. She is about 75% improved. Her upper endoscopy showed reflux esophagitis.   GI Review of Systems    Reports abdominal pain and  bloating.      Denies acid reflux, belching, chest pain, dysphagia with liquids, dysphagia with solids, heartburn, loss of appetite, nausea, vomiting, vomiting blood, weight loss, and  weight gain.        Denies anal fissure, black tarry stools, change in bowel habit, constipation, diarrhea, diverticulosis, fecal incontinence, heme positive stool, hemorrhoids, irritable bowel syndrome, jaundice, light color stool, liver problems, rectal bleeding, and  rectal pain.    Current Medications (verified): 1)  Levothroid 100 Mcg Tabs (Levothyroxine Sodium) .... Take 1 Tablet By Mouth Once A Day 2)  Pravachol 40 Mg Tabs (Pravastatin Sodium) .... Take 1 Tablet By Mouth Once A Day 3)  Prozac 20 Mg Caps (Fluoxetine Hcl) .... Take 1 Tablet By Mouth Once A Day 4)  Dicyclomine Hcl 20 Mg Tabs (Dicyclomine Hcl) .... Take 1 Tablet By Mouth Three Times A Day 5)  Asacol 400 Mg Tbec (Mesalamine) .... Take 3 Tablets By Mouth Three Times A Day 6)  Prednisone 10 Mg  Tabs (Prednisone) .... 2 Tablets By Mouth Once Daily 7)  Alprazolam  0.25 Mg Tabs (Alprazolam) .... As Needed 8)  Vivelle-Dot 0.0375 Mg/24hr Pttw (Estradiol) .... 2 X Week  Allergies (verified): 1)  ! Erythromycin 2)  ! Demerol 3)  ! * Avelox 4)  ! * Imuran 5)  ! * Latex 6)  Doxycycline  Past History:  Past Medical History: Reviewed history from 08/24/2009 and no changes required. Current Problems:  IRRITABLE BOWEL SYNDROME (ICD-564.1) ARTHRITIS (ICD-716.90) TACHYCARDIA, HX OF (ICD-V12.50) FIBROCYSTIC BREAST DISEASE, HX OF (ICD-V13.9) GERD (ICD-530.81) PANCREATITIS, HX OF (ICD-V12.70) Hx of OVARIAN CYST (ICD-620.2) UNSPECIFIED HYPOTHYROIDISM (ICD-244.9) HYPERLIPIDEMIA (ICD-272.4) HYPERTENSION (ICD-401.9) PSORIASIS (ICD-696.1) ULCERATIVE COLITIS (ICD-556.9)    Past Surgical History: Reviewed history from 08/24/2009 and no changes required. Tubal Ligation Hysterectomy Repair of cystocele and rectocele Nissen Fundoplication Thyroid Ablation Left Ankle Reconstruction Bladder Tuck Back  Family History: Reviewed history from 08/30/2009 and no changes required. Family History of Crohn's: Sister Family History of Breast Cancer: Mother Family History of Heart Disease: Father (MI in his 29's), Mother Family History of Colon Polyps: Mother Family History of Irritable Bowel Syndrome: No FH of Colon Cancer:  Social History: Reviewed history from 08/30/2009 and no changes required. 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 -quit in 2007 Alcohol Use - yes-rare Illicit Drug Use - no Daily Caffeine Use 3/day  Review of Systems  The patient denies allergy/sinus, anemia, anxiety-new, arthritis/joint pain, back pain, blood in urine, breast changes/lumps, change in vision, confusion, cough, coughing up blood, depression-new, fainting, fatigue, fever, headaches-new, hearing problems, heart murmur, heart rhythm changes, itching,  menstrual pain, muscle pains/cramps, night sweats, nosebleeds, pregnancy symptoms,  shortness of breath, skin rash, sleeping problems, sore throat, swelling of feet/legs, swollen lymph glands, thirst - excessive , urination - excessive , urination changes/pain, urine leakage, vision changes, and voice change.         Pertinent positive and negative review of systems were noted in the above HPI. All other ROS was otherwise negative.   Vital Signs:  Patient profile:   45 year old female Height:      66 inches Weight:      198 pounds BMI:     32.07 Pulse rate:   100 / minute Pulse rhythm:   regular BP sitting:   128 / 68  (right arm) Cuff size:   regular  Vitals Entered By: Marlon Pel CMA Deborra Medina) (October 14, 2009 3:40 PM)  Physical Exam  General:  cushingoid, overweight, alert and oriented. Eyes:  PERRLA, no icterus. Mouth:  No deformity or lesions, dentition normal. Neck:  Supple; no masses or thyromegaly. Lungs:  Clear throughout to auscultation. Heart:  Regular rate and rhythm; no murmurs, rubs,  or bruits. Abdomen:  protuberant abdomen with normoactive bowel sounds. No focal tenderness. Liver edge at costal margin with the left lobe of the liver or topical in the epigastrium. Splenic tip palpable. Extremities:  No clubbing, cyanosis, edema or deformities noted. Skin:  Intact without significant lesions or rashes. Psych:  Alert and cooperative. Normal mood and affect.   Impression & Recommendations:  Problem # 1:  ULCERATIVE COLITIS (ICD-556.9) Patient has ulcerative pan colitis documented on a recent colonoscopy. She has responded to a prednisone taper and mesalamine total of 4.8 g daily. She will continue the taper by 5 mg every 2 weeks and by 2.5 mg every 2 weeks after she gets to 10 mg. We have discussed the possibility of using biologicals. She was unable to tolerate Humira because her psoriasis flared up. So, we could consider Remicade infusions. She is intolerant to 6-MP because she developed pancreatitis. I would also consider methotrexate which would  cross cover with her psoriasis. She is to see her rheumatologist in the next couple weeks then we may communicate as to the use of Remicade for both psoriasis and ulcerative colitis.  Problem # 2:  PANCREATITIS, HX OF (ICD-V12.70) Assessment: Comment Only  Patient Instructions: 1)  continue Asacol at 4.8 g daily. 2)  Continue prednisone taper as delineated for the patient by 5 mg every 2 weeks.  3)  Office visit in 6 weeks.  4)  Consider Remicade infusions as per Dr.Deveshwar. 5)  Copy sent to : Dr Deterding. Dr Estanislado Pandy Prescriptions: ASACOL 400 MG TBEC (MESALAMINE) Take 4 tablets by mouth three times a day (please d/c prescription for 3 tabs three times a day)  #360 x 3   Entered by:   [name] CMA ([place])   Authorized by:   Lafayette Dragon MD   Signed by:   [name] CMA ([place]) on 10/14/2009   Method used:   Electronically to        [facility] (retail)       [place], [place]  [address]       Ph: [phone]       Fax: [phone]   RxID:   [id number]

## 2010-08-16 NOTE — Letter (Signed)
Summary: Patient Notice- Colon Biospy Results  [facility]  [address] [place], [place] [address]   Phone: [phone]  Fax: [phone]        September 14, 2009 MRN: [id number]    [name] [place], [place]  [address]    Dear Ms. Sanabia,  I am pleased to inform you that the biopsies taken during your recent colonoscopy did not show any evidence of cancer upon pathologic examination.The colon biopsies confirm moderately active ulcerative colitis,I have talked to You about it on the phone and recommended prednisone.  Additional information/recommendations:  __No further action is needed at this time.  Please follow-up with      your primary care physician for your other healthcare needs.  _x_Please call [phone] to schedule a return visit to review      your condition.  _x_Continue with the treatment plan as outlined on the day of your      exam.Please pick up Prednisone in Your Pharmacy, and, also, increase Your Asaca to 12 tablets/day.  __You should have a repeat colonoscopy examination for this problem           in _ years.  Please call us if you are having persistent problems or have questions about your condition that have not been fully answered at this time.  Sincerely,  Lafayette Dragon MD   This letter has been electronically signed by your physician.  Appended Document: Patient Notice- Colon Biospy Results letter mailed 3.3.11

## 2010-08-16 NOTE — Op Note (Signed)
Summary: Esophageal Manometry                    [facility]  Patient:    [name], [name] Visit Number: [id number] MRN: [id number]          Service Type: END Location: ENDO Attending Physician:  Sherrin Daisy Dictated by:   Joyice Faster. Oletta Lamas, M.D. Proc. Date: 06/10/01 Admit Date:  06/10/2001 Discharge Date: 06/10/2001   CC:         Darrelyn Hillock, M.D.  [name], M.D.   Procedure Report  PROCEDURE:  Esophageal manometry.  INDICATION:  This is being done preoperatively in consideration of an antireflux procedure.  DESCRIPTION OF PROCEDURE:  The procedure was performed in the usual manner. No provocative studies were performed, and there was no 24-hour pH probe performed.  Results were as follows:  1. Upper esophageal sphincter relaxed completely with swallowing.  Pharyngeal    spikes were present. 2. Esophageal body:  Normal peristalsis in all wet swallows seen.  Mean    pressure 98, mean duration 3.9 seconds. 3. Lower esophageal sphincter mean pressure 35 mm, within the normal range.    Appears to relax with swallowing.  ASSESSMENT:  Essentially normal manometry.  PLAN:  The patient will see Dr. Truitt Leep for consideration for antireflux surgery. Dictated by:   Joyice Faster. Oletta Lamas, M.D. Attending Physician:  Sherrin Daisy DD:  06/22/01 TD:  06/22/01 Job: [id number]

## 2010-08-16 NOTE — Discharge Summary (Signed)
Summary: Pancreatitis    NAME:  [name], [name]                      ACCOUNT NO.:  [id number]   MEDICAL RECORD NO.:  [id number]                   PATIENT TYPE:  INP   LOCATION:  5533                                 FACILITY:  [facility]   PHYSICIAN:  James L. Rolla Flatten., M.D.          DATE OF BIRTH:  10-31-1965   DATE OF ADMISSION:  06/23/2002  DATE OF DISCHARGE:  06/26/2002                                 DISCHARGE SUMMARY   ADMISSION DIAGNOSIS:  Recurrent pancreatitis.   FINAL DIAGNOSIS:  Recurrent pancreatitis improved felt due to Imuran.   HISTORY:  The patient had previously been admitted with pancreatitis, which  started after she began Imuran. She does have ulcerative colitis and had  been on prednisone, Asacol, and Imuran, but the Imuran was the only  medication that was new. She had also been on Lipitor. All of these  medications have been shown to cause drug-induced pancreatitis. She was  hospitalized briefly in November, was improved, started eating and became  sick again with nausea and vomiting.   PHYSICAL EXAMINATION:  VITAL SIGNS:  She was afebrile. Vital signs were  normal.  ABDOMEN:  Soft with localized epigastric tenderness. For more details please  see the dictated admission history and physical.   HOSPITAL COURSE:  The patient was admitted to the medical floor, placed on  IV fluids and made NPO. Liver tests were normal. White count was 12, amylase  was 257 and lipase was 497. She has remained NPO. Felt better the next day.  CT scan was obtained, which showed mild pancreatitis particularly of the  tail. It was thought that she was having a relapse of her drug-induced  pancreatitis. She still had some epigastric pain, but was tolerating clear  liquids. By the 11th she was tolerating clear liquids without pain and we  felt that she could be discharged.    DISPOSITION:  The patient is discharged home on prednisone and Colazal with  a tapering dose of  prednisone. We will hold the Imuran, Asacol, and Lipitor.  She will be on a fat-free clear liquid diet and slowly add potatoes and  pasta. Will see Dr. Oletta Lamas back in the office in early January. If her  pancreatitis relapses again it may well be that she will need to be on home  T&A.                                               James L. Rolla Flatten., M.D.    [name]  D:  07/29/2002  T:  07/29/2002  Job:  [id number]   cc:   Jeneen Rinks L. Deterding, M.D.  [place]  [place] [address]  Fax: [phone]   Osvaldo Human, M.D.  [address]  [place] [address]  Fax: [phone]

## 2010-08-16 NOTE — Discharge Summary (Signed)
Summary: Abdominal Pain   NAME:  [name], [name]               ACCOUNT NO.:  [id number]   MEDICAL RECORD NO.:  [id number]          PATIENT TYPE:  INP   LOCATION:  5511                         FACILITY:  [facility]   PHYSICIAN:  James L. Rolla Flatten., M.D.DATE OF BIRTH:  [birth year]   DATE OF ADMISSION:  04/01/2004  DATE OF DISCHARGE:  04/05/2004                                 DISCHARGE SUMMARY   ADMITTING DIAGNOSES:  Abdominal pain, cramps, and diarrhea in a woman with  history of ulcerative colitis.   FINAL DIAGNOSES:  1.  Abdominal pain, cramps, and diarrhea, improved.  Etiology undetermined.  2.  History of severe pan colitis carrying a diagnosis of ulcerative colitis      with sigmoidoscopy on this admission showing the disease to be in      remission in the left colon.  3.  Multiple arthralgias presented to be extraintestinal manifestations of      ulcerative colitis.  4.  History of pancreatitis secondary to Imuran.  5.  History of gastroesophageal reflux disease with esophagitis by endoscopy      despite previous Nissen fundoplication.  6.  Status post hysterectomy, right oophorectomy, left ovarian cystectomy      with rectocele/cystocele repair in the past.  7.  Cholecystectomy in 2004 due to adenomatosis of the gallbladder.  8.  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  9.  History of recent increased creatinine secondary to cyclosporin use.  10. History of autoimmune disorder with autoimmune thyroiditis followed by      Dr. Justine Null.  11. History of hyperlipidemia.  12. Hypertension.  13. History of eczema presumed secondary to inflammatory bowel disease.   MEDICATIONS ON ADMISSION:  1.  Cipro.  2.  Synthroid.  3.  Toprol.  4.  Flagyl.  5.  Protonix.  6.  Prednisone.  7.  Hydrocodone.  8.  Lasix every other day.  9.  She had been on cyclosporin but that had been on hold due to a rising      creatinine.   ALLERGIES:  IMURAN, DOXYCYCLINE, ERYTHROMYCIN,  DEMEROL.   PROCEDURE:  1.  Flexible sigmoidoscopy.  2.  CT scan of the abdomen and pelvis.   CONSULTS:  1.  Dr. Jeneen Rinks Deterding of nephrology  2.  Dr. Thomos Lemons of rheumatology   HISTORY:  A 45 year old female with ulcerative colitis with a colonoscopy  about a year and a half ago showing the disease to be in remission who has  been bothered with rashes and extraintestinal manifestations, primarily  arthralgias.  She had been treated with Imuran, developed Imuran-induced  pancreatitis actually requiring home TNA.  This finally resolved.  She was  placed on cyclosporin.  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  Her creatinine began to rise and her  cyclosporin dose had to be lowered from 200 mg to 50 mg a day and she came  in with abdominal cramps  and diarrhea with associated increased hip and  joint pain which began several days prior to her admission.  She also had  low grade fevers and had been started on antibiotics.  She had had no blood  in her stool, but had been on some Augmentin a month earlier for a  respiratory tract infection.   PHYSICAL EXAMINATION:  VITAL SIGNS:  Temperature 98.9, respirations 18,  heart rate 110, blood pressure 136/70.  GENERAL:  She is a well-developed, well-nourished female in mild distress.  HEENT:  Normal.  NECK:  Normal.  HEART:  Normal.  LUNGS:  Normal.  ABDOMEN:  Some diminished bowel sounds.  Mildly tender.  No peritoneal  signs.   For more details, please see dictated admission history and physical.   HOSPITAL COURSE:  Patient was admitted to medical floor.  Routine  laboratories were obtained.  IV fluids were started.  She was maintained on  her outpatient medications.  Her initial laboratories revealed a white count  of 8.8, hemoglobin of 12.2, sedimentation rate of 25, normal electrolytes  with an elevated  glucose of 116, albumin of 3.9.  Phosphorous was slightly  low at 1.7.  Amylase and lipase were normal.  TSH was in the normal range at  0.38.  Urinalysis was normal.  Blood/urine cultures were obtained and are  negative at the time of this dictation.  The patient underwent a flexible  sigmoidoscopy by Dr. Cristina Gong the splenic flexure with a normal mucosa, no  signs of active colitis.  Biopsies subsequently returned showing normal  mucosa with no evidence of ulcerative colitis.  Patient continued to  improve, still had somewhat loose stools and joint pains.  Had been started  on Solu-Medrol initially.  This was changed to oral prednisone.  Felt much  better.  She continued to intermittently have fevers and arthralgias but  these gradually improved with the steroids.  She was tolerating a bland diet  without problems.  We went ahead with a CT of the abdomen and pelvis which  showed no thickening of the bowel at all, no evidence of colitis or Crohn's  disease.  She was changed over to completely oral medications.  She was seen  in consultation by Dr. Kristen Loader who had been following her for some time.  It was not clear what was causing her severe arthralgias and weakness, most  likely some sort of autoimmune condition probably connected with  inflammatory bowel disease.  It was felt she could be treated with  prednisone, followed up in the office with considerations for other  medications.  The patient had been on __________ preparations but these were  stopped at some point in the past.  Have discussed with her the need to stay  on these since her disease was in remission in hopes of maintaining at least  a colonic remission.  Her condition was improved.  She was feeling better.  It was felt she could continue to be followed closely as an outpatient.   DISPOSITION:  The patient is discharged home on her regular diet.   DISCHARGE MEDICATIONS:  1.  Prednisone 20 mg daily.  2.  Asacol 400 mg  two t.i.d. 3.  Toprol 100 mg daily.  4.  Synthroid 100 mcg daily.  5.  Protonix 40 mg daily.  6.  Ambien 5 mg at night q.h.s.  7.  She will remain off the cyclosporin or any other antimetabolites for      now.   A small-bowel  series will be arranged as an outpatient to rule out Crohn's  disease.  We will continue to pursue an evaluation at the [facility] at [facility].  She will see Dr. Oletta Lamas in the office in  two weeks, will arrange an appointment, and Dr. Justine Null in one to two weeks.       JLE/MEDQ  D:  04/05/2004  T:  04/05/2004  Job:  [id number]   cc:   Michael Litter, M.D.  [place]  [place] [address]  Fax: Aiea Deterding, M.D.  [place]  [place] [address]  Fax: [phone]

## 2010-08-16 NOTE — Procedures (Signed)
Summary: Upper Endoscopy  Patient: [name] Note: All result statuses are Final unless otherwise noted.  Tests: (1) Upper Endoscopy (EGD)   EGD Upper Endoscopy       [facility]     [place], [place]  [address]           ENDOSCOPY PROCEDURE REPORT           PATIENT:  [name], [name]  MR#:  [id number]     BIRTHDATE:  1966-05-01, 44 yrs. old  GENDER:  female           ENDOSCOPIST:  Lowella Bandy. Olevia Perches, MD     Referred by:  Mauricia Area, M.D.           PROCEDURE DATE:  09/09/2009     PROCEDURE:  EGD with biopsy, Maloney Dilation of Esophagus     ASA CLASS:  Class I     INDICATIONS:  GERD hx of esophagitis, s/p Nissen fundoplication in     2248     hx of Imuran induced pancreatitis, normal HIDA scan 91%EF           MEDICATIONS:   Versed 10 mg, Fentanyl 75 mcg     TOPICAL ANESTHETIC:  Exactacain Spray           DESCRIPTION OF PROCEDURE:   After the risks benefits and     alternatives of the procedure were thoroughly explained, informed     consent was obtained.  The [facility] GIF-H180 [id number] endoscope was     introduced through the mouth and advanced to the second portion of     the duodenum, without limitations.  The instrument was slowly     withdrawn as the mucosa was fully examined.     <<PROCEDUREIMAGES>>           Duodenitis was found. patchy erythema of the bulb and of the     descending duodenum With standard forceps, a biopsy was obtained     and sent to pathology (see image5).  Post-operative change was     noted (see image7 and image6). s/p Nissen fundoplication maloney     dilator 18F maloney passed without difficulty  irregular Z-line.     With standard forceps, a biopsy was obtained and sent to pathology     (see image8, image2, and image1).    Retroflexed views revealed no     abnormalities.    The scope was then withdrawn from the patient     and the procedure completed.           COMPLICATIONS:  None           ENDOSCOPIC  IMPRESSION:     1) Duodenitis     2) Post-operative change     3) Irregular Z-line     s/p Biopsies to r/o H.pylori     s/p Nissen Fundoplication, s/p passage of 18F maloney dil., r/o     barrett's esophagus     RECOMMENDATIONS:     colonoscopy           REPEAT EXAM:  In 0 year(s) for.           ______________________________     Lowella Bandy. Olevia Perches, MD           CC:           n.     eSIGNED:   Lowella Bandy. Ronnika Collett at 09/09/2009 04:18 PM  [place], [place], [phone]  Note: An exclamation mark (!) indicates a result that was not dispersed into the flowsheet. Document Creation Date: 09/09/2009 4:19 PM _______________________________________________________________________  (1) Order result status: Final Collection or observation date-time: 09/09/2009 15:44 Requested date-time:  Receipt date-time:  Reported date-time:  Referring Physician:   Ordering Physician: [name] [phone]) Specimen Source:  Source: [name] Cooler Order Number: [id number] Lab site:

## 2010-08-16 NOTE — Progress Notes (Signed)
Summary: Asacol refill  Phone Note Call from Patient   Caller: Patient Call For: Dr. Olevia Perches Reason for Call: Refill Medication Summary of Call: Asacol refill send to [facility] Initial call taken by: Webb Laws,  January 24, 2010 11:55 AM  Follow-up for Phone Call        Left message that I am unable to send a 3 month supply of medication to [facility] as patient is due for a follow up office visit. She was scheduled to see [place] today but rescheduled to 02/23/10. I have, however, sent a 1 month supply of Asacol until her next office visit to her local pharmacy. Patient to call back with any questiosns. Follow-up by: Madlyn Frankel CMA ([place]),  January 24, 2010 12:22 PM    New/Updated Medications: ASACOL 400 MG TBEC (MESALAMINE) Take 4 tablets by mouth three times a day MUST HAVE OFFICE VISIT FOR FURTHER REFILLS! Prescriptions: ASACOL 400 MG TBEC (MESALAMINE) Take 4 tablets by mouth three times a day MUST HAVE OFFICE VISIT FOR FURTHER REFILLS!  #360 x 0   Entered by:   Madlyn Frankel CMA (AAMA)   Authorized by:   Lafayette Dragon MD   Signed by:   Madlyn Frankel CMA ([place]) on 01/24/2010   Method used:   Electronically to        [facility] (retail)       [address]       [place], [place]  [address]       Ph: [phone]       Fax: [phone]   RxID:   [id number]

## 2010-08-16 NOTE — Letter (Signed)
Summary: [facility]   Imported By: Phillis Knack 11/11/2009 13:31:55  _____________________________________________________________________  External Attachment:    Type:   Image     Comment:   External Document

## 2010-08-16 NOTE — Discharge Summary (Signed)
Summary: Ulcerative Colitis  [name], [name]               ACCOUNT NO.:  [id number]      MEDICAL RECORD NO.:  [id number]          PATIENT TYPE:  INP      LOCATION:  5734                         FACILITY:  [facility]      PHYSICIAN:  Lear Ng, MDDATE OF BIRTH:  Jul 22, 1965      DATE OF ADMISSION:  01/08/2007   DATE OF DISCHARGE:                                  DISCHARGE SUMMARY      DISCHARGE DIAGNOSES:   1. Ileus.   2. Ulcerative colitis.   3. History of chronic pancreatitis.   4. History of psoriasis.      DISCHARGE MEDICINES:   1. Metoprolol 50 mg b.i.d.   2. Synthroid 100 mcg daily.   3. Protonix 40 mg daily.   4. Cyclosporin 125 mg p.o. b.i.d.   5. Vytorin 10/40 mg p.o. daily.      DISCHARGE CONDITION:  Improved.      HISTORY OF PRESENT ILLNESS:  Ms. Swetz was directly admitted from Dr.   Oletta Lamas' office on January 08, 2007 due to abdominal pain and having a   leukocytosis with white blood count of 13; that white blood count was   drawn at her nephrologist's office, Dr. Jimmy Footman, and due to her severe   epigastric and left upper quadrant abdominal pain, she was admitted to   the hospital for further management.  She did well in the hospital with   clear liquid diet and began moving her bowels on January 10, 2007.  During   her hospitalization, she initially had abdominal x-ray done which showed   mild jejunal ileus, but no evidence of obstruction.  A CAT scan was then   done which showed no acute abnormality.  It did show mild sigmoid   diverticulosis, 2 left ovarian cysts and evidence of a previous Nissen   fundoplication.  Ms. Vath did well with a quick recovery from her   abdominal pain, which resolved.  Her elevated white blood count also   resolved within 24 hours of hospitalization.  She was able to tolerate   clear liquids and this was advanced to a regular diet, which she   tolerated as well.  She was moving her bowels well and the pain and   resolved.   She was stable for discharge on January 10, 2007.  On day of   discharge, her cyclosporin level, which was ordered, is pending and she   will follow up with Dr. Jimmy Footman on this as an outpatient.      DISCHARGE FOLLOWUP:  Follow up with Dr. Laurence Spates on July 24 at   10:45 a.m.  Follow up with Dr. Jimmy Footman in 1 week and she should call   his office to arrange that.      DISCHARGE ACTIVITY:  No restrictions.      DISCHARGE DIET:  No restrictions.               Lear Ng, MD   Electronically Signed  VCS/MEDQ  D:  01/10/2007  T:  01/11/2007  Job:  [id number]      cc:   Jeneen Rinks L. Deterding, M.D.   James L. Rolla Flatten., M.D.   Michael Litter, M.D.

## 2010-08-16 NOTE — Letter (Signed)
Summary: [facility]   Imported By: Bubba Hales 01/20/2010 09:42:45  _____________________________________________________________________  External Attachment:    Type:   Image     Comment:   External Document

## 2010-08-16 NOTE — Op Note (Signed)
Summary: EGD                    [place]  Patient:    Carla, Chavez Visit Number: [id number] MRN: [id number]          Service Type: END Location: ENDO Attending Physician:  Sherrin Daisy Proc. Date: 04/24/01 Admit Date:  04/24/2001   CC:         [name], M.D.   Procedure Report  PROCEDURE PERFORMED:  Esophagogastroduodenoscopy.  ENDOSCOPIST:  Joyice Faster. Edwards, M.D.  MEDICATIONS:  Fentanyl 50 mcg and Versed 7 mg IV.  INDICATION:  Persistent epigastric pain and worsening reflux symptoms despite the use of aggressive therapy.  DESCRIPTION OF PROCEDURE:  The patient has been explained to the patient and consent obtained.  With the patient in the left lateral decubitus position, the Olympus video endoscope was inserted blindly in the esophagus under direct visualization.  The stomach was entered and the scope passed.  The duodenum including the bulb and second portion were seen well and was unremarkable. The scope was withdrawn back in the stomach and the pyloric channel, antrum, and body were normal.  The fundus and cardia were seen well under direct visualization and were normal.  There was a 2-3 cm hiatal hernia with a widely patent GE junction with free reflux with the stomach bulging up into the chest.  The distal esophagus was red and there were several areas that appeared to be healing ulcerations.  They were linear right at the GE junction.  There was no active bleeding.  The scope was withdrawn and the proximal esophagus was normal.  ASSESSMENT:  Small esophageal ulcers, almost certainly secondary to gastroesophageal reflux disease with free reflux seen at the time of the procedure.  PLAN: 1. Will continue Aciphex b.i.d. for reflux. 2. Will check the gallbladder ultrasound and see her back in the    office in 6-8 weeks. Attending Physician:  Sherrin Daisy DD:  04/24/01 TD:  04/24/01 Job: [id number]

## 2010-08-16 NOTE — Discharge Summary (Signed)
Summary: Pancreatitis    NAME:  [name], [name]                      ACCOUNT NO.:  [id number]   MEDICAL RECORD NO.:  [id number]                   PATIENT TYPE:  INP   LOCATION:  3036                                 FACILITY:  [facility]   PHYSICIAN:  James L. Rolla Flatten., M.D.          DATE OF BIRTH:  28-Oct-1965   DATE OF ADMISSION:  06/11/2002  DATE OF DISCHARGE:  06/12/2002                                 DISCHARGE SUMMARY   FINAL DIAGNOSIS:  Pancreatitis, felt due to Imuran.   PERTINENT HISTORY:  The patient, a 45 year old with ulcerative colitis was  started on prednisone in the past.  As soon as she tapered off it, her  colitis flared.  Recently we saw her, and Imuran was started around the 1st  of November, and she developed abdominal pain suddenly.  Amylase and lipase  were elevated.  Ultrasound in the past had shown no gallstones.  The patient  had epigastric pain, radiating to the back and on exam was remarkable for  mild tenderness in the left upper quadrant.   HOSPITAL COURSE:  The patient was admitted to a medical floor with the  diagnosis of pancreatitis.  A  gallbladder ultrasound was obtained which  showed the pancreatic tail was obscured; the body and head were within  normal limits.  There were no gallstones in the gallbladder, and common bile  duct was normal.  The day following her admission, her amylase and lipase  had improved, amylase down to 65.  She was tolerating clear liquids.  It was  felt this was drug-induced pancreatitis, probably due to the initiation of  Imuran.   DISPOSITION:  The patient was discharged home to continue all of her home  medications, withholding Imuran.  Will see Jeneen Rinks L. Oletta Lamas, M.D. in the  office next week.  She will call if she has any further problems.                                               James L. Rolla Flatten., M.D.    [name]  D:  07/03/2002  T:  07/05/2002  Job:  [id number]   cc:   Osvaldo Human, M.D.  [place]  [place]  [place] [address]  Fax: Meadow Deterding, M.D.  [place]  [place] [address]  Fax: [phone]

## 2010-08-16 NOTE — Procedures (Signed)
Summary: Colonoscopy  Patient: [name] Note: All result statuses are Final unless otherwise noted.  Tests: (1) Colonoscopy (COL)   COL Colonoscopy           [facility]     [place], [place]  [address]           COLONOSCOPY PROCEDURE REPORT           PATIENT:  [name], [name]  MR#:  [id number]     BIRTHDATE:  1966-04-12, 44 yrs. old  GENDER:  female           ENDOSCOPIST:  Lowella Bandy. Olevia Perches, MD     Referred by:  Mauricia Area, M.D.           PROCEDURE DATE:  09/09/2009     PROCEDURE:  Colonoscopy 55374     ASA CLASS:  Class I     INDICATIONS:  unexplained diarrhea, hematochezia hx U.C 2002,,     sister with Crohn's disease     was on Imuran, Prednisone, Cyclosporine           MEDICATIONS:   Fentanyl 25 mcg           DESCRIPTION OF PROCEDURE:   After the risks benefits and     alternatives of the procedure were thoroughly explained, informed     consent was obtained.  Digital rectal exam was performed and     revealed no rectal masses.   The LB PCF-[id number] endoscope     was introduced through the anus and advanced to the cecum, which     was identified by both the appendix and ileocecal valve, without     limitations.  The quality of the prep was good, using MoviPrep.     The instrument was then slowly withdrawn as the colon was fully     examined.     <<PROCEDUREIMAGES>>           FINDINGS:  Abnormal appearing mucosa in the rectum. fine     nodularity of the rectum, early polyp vs chronic colitis With     standard forceps, biopsy was obtained and sent to pathology (see     image7, image6, and image5).  This was otherwise a normal     examination of the colon. r/o microscopic colitis Random biopsies     were obtained and sent to pathology (see image4, image3, and     image1).   Retroflexed views in the rectum revealed no     abnormalities.    The scope was then withdrawn from the patient     and the procedure completed.       COMPLICATIONS:  None           ENDOSCOPIC IMPRESSION:     1) Abnormal mucosa in the rectum     2) Otherwise normal examination     fine nodularity of the rectum, s/p biopsies, r/o chronic colitis     vs adenomatous transformation     RECOMMENDATIONS:     1) Await biopsy results     continue Asacal and Bentyl till biopsies are available           REPEAT EXAM:  In 3 year(s) for.           ______________________________     Lowella Bandy. Olevia Perches, MD           CC:  n.     eSIGNED:   Lowella Bandy. Nahuel Wilbert at 09/09/2009 04:26 PM           Myer Haff, [id number]  Note: An exclamation mark (!) indicates a result that was not dispersed into the flowsheet. Document Creation Date: 09/09/2009 4:27 PM _______________________________________________________________________  (1) Order result status: Final Collection or observation date-time: 09/09/2009 16:06 Requested date-time:  Receipt date-time:  Reported date-time:  Referring Physician:   Ordering Physician: [name] [phone]) Specimen Source:  Source: [name] Cooler Order Number: [id number] Lab site:   Appended Document: Colonoscopy     Procedures Next Due Date:    Colonoscopy: 08/2014

## 2010-08-16 NOTE — Letter (Signed)
Summary: New Patient letter  [facility]  [address] [place], [place] [address]   Phone: [phone]  Fax: [phone]       08/23/2009 MRN: [id number]  [address] [place], [place]  [address]  Dear [name],  Welcome to the Gastroenterology Division at [facility].    You are scheduled to see Dr. Olevia Perches on 08/30/2009 at 1:30PM on the 3rd floor at [facility], [place] [employer].  We ask that you try to arrive at our office 15 minutes prior to your appointment time to allow for check-in.  We would like you to complete the enclosed self-administered evaluation form prior to your visit and bring it with you on the day of your appointment.  We will review it with you.  Also, please bring a complete list of all your medications or, if you prefer, bring the medication bottles and we will list them.  Please bring your insurance card so that we may make a copy of it.  If your insurance requires a referral to see a specialist, please bring your referral form from your primary care physician.  Co-payments are due at the time of your visit and may be paid by cash, check or credit card.     Your office visit will consist of a consult with your physician (includes a physical exam), any laboratory testing he/she may order, scheduling of any necessary diagnostic testing (e.g. x-ray, ultrasound, CT-scan), and scheduling of a procedure (e.g. Endoscopy, Colonoscopy) if required.  Please allow enough time on your schedule to allow for any/all of these possibilities.    If you cannot keep your appointment, please call [phone] to cancel or reschedule prior to your appointment date.  This allows [place] the opportunity to schedule an appointment for another patient in need of care.  If you do not cancel or reschedule by 5 p.m. the business day prior to your appointment date, you will be charged a $50.00 late cancellation/no-show fee.    Thank you for choosing [facility] for your medical needs.  We appreciate the opportunity to care for you.  Please visit [place] at our website  to learn more about our practice.                     Sincerely,                                                             The Gastroenterology Division

## 2010-08-16 NOTE — Op Note (Signed)
Summary: Nissen Fundoplication                         [facility]  Patient:    [name], [name] Visit Number: [id number] MRN: [id number]          Service Type: SUR Location: 3W Trainer 01 Attending Physician:  Darrelyn Hillock Dictated by:   Darrelyn Hillock, M.D. Proc. Date: 07/24/01 Admit Date:  07/24/2001   CC:         Jeneen Rinks L. Oletta Lamas, M.D.   Operative Report  PREOPERATIVE DIAGNOSIS:  Gastroesophageal reflux disease and hiatal hernia.  POSTOPERATIVE DIAGNOSIS:  Gastroesophageal reflux disease and hiatal hernia.  PROCEDURE:  Laparoscopic Nissen fundoplication.  SURGEON:  Darrelyn Hillock, M.D.  ASSISTANT:  Marland Kitchen T. Hoxworth, M.D.  ANESTHESIA:  General.  DESCRIPTION OF PROCEDURE:  The patient was taken to the operating room, placed in the supine position. After adequate general anesthesia was induced using endotracheal tube, the abdomen was prepped and draped in the normal sterile fashion. A small supraumbilical vertical incision was made. I dissected down to the fascia, which was opened vertically. The peritoneum was entered. Hasson trocar was placed in the abdomen and I had difficulty insufflating but finally obtained pneumoperitoneum to a pressure of 15 mmHg. However, on delivering the camera through the Hasson, it appeared that it was through the falciform ligament. Under direct visualization, a 10 mm port was placed in the left upper quadrant. The camera was then placed through that port and indeed the Hasson trocar was through a very broad, thick and long falciform ligament. There was no evidence of bleeding. Under direct visualization, two additional 10 mm ports were placed in the right upper quadrant. Again, this was difficulty because of the large size of the falciform. This was accomplished under direct visualization. A small 5 mm port was placed in the left upper quadrant. A left lateral segment in the liver was retracted  anteriorly. I began dissection in the right paraesophageal space until the right crus was identified. There was significant inflammation around the right crus of the diaphragm. It _______ pretty low on the esophagus as well. It was dissected off the esophagus and the peritoneum was incised anterior to the esophagus. Left crus was identified. The esophagus was then retracted anteriorly and a window was created posterior to the esophagus. The hiatal hernia defect was not exceptionally large.  I then took down a number of short gastrics on the greater curvature of the stomach proximally toward the spleen. This mobilized an adequate amount of fundus which was then passed behind the esophagus and laid without any tension. The hiatal defect was closed with two 0 Tycron sutures and the fundoplication was accomplished with three 0 Tycron sutures approximating the fundus, esophagus, and stomach. The wrap measured about 3 cm and it was accomplished over 48 bougie. I was very satisfied with wrap, the bougie was removed, and adequate hemostasis was insured. The trocars were all removed. The abdomen was allowed to deflate. The fascial defect was closed with the 0 Vicryl pursestring suture. The skin incisions were closed with staples. All incisions were injected using 0.5% Marcaine. The patient tolerated the procedure well and went to PACU in good condition. Dictated by:   Darrelyn Hillock, M.D. Attending Physician:  Janeece Agee R. DD:  07/24/01 TD:  07/24/01 Job: [id number]

## 2010-08-16 NOTE — Progress Notes (Signed)
Summary: Biopsy report  Phone Note Call from Patient Call back at Home Phone [phone]   Call For: Dr Olevia Perches Summary of Call: Wonders if you can fax her a copy of her biopsy to fax# [phone] Initial call taken by: Irwin Brakeman [facility],  September 15, 2009 9:36 AM  Follow-up for Phone Call        done. Follow-up by: [name] CMA Deborra Medina),  September 15, 2009 9:43 AM

## 2010-08-16 NOTE — Letter (Signed)
Summary: [facility]  [facility]   Imported By: Phillis Knack 12/30/2009 07:47:56  _____________________________________________________________________  External Attachment:    Type:   Image     Comment:   External Document

## 2010-08-16 NOTE — Op Note (Signed)
Summary: FLEXIBLE SIGMOIDOSCOPY (Dr Cristina Gong)   NAME:  [name], [name]                         ACCOUNT NO.:  [id number]   MEDICAL RECORD NO.:  [id number]                   PATIENT TYPE:  INP   LOCATION:  1828                                 FACILITY:  [facility]   PHYSICIAN:  Ronald Lobo, M.D.                DATE OF BIRTH:  [birth year]   DATE OF PROCEDURE:  04/01/2004  DATE OF DISCHARGE:                                 OPERATIVE REPORT   PROCEDURE PERFORMED:  Flexible sigmoidoscopy with biopsies.   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   FINDINGS:  Normal exam to the splenic flexure.   DESCRIPTION OF PROCEDURE:  The nature, purpose and risks of the procedure  have been reviewed with the patient and she provided written consent.  She  was brought from the emergency room up to the endoscopy unit and given light  sedation with Versed 10 mg IV to a level of mild sedation.  No fentanyl was  used (history of hives to Demerol and I just wanted to do a limited exam, so  I did not feel that I wanted to take the risk of a cross reaction with  another opiate).  The Olympus adjustable tension pediatric video colonoscope  was advanced to the splenic flexure without much difficulty and pullback was  then performed.  At the maximal insertion of the scope, it appeared that I  was looking down the tunnel of the transverse colon with typical triangular  haustrations.  There was a moderate amount of formed stool in the more  proximal regions of the exam and scant formed stool more distally.  No  liquid stool was seen.  This exam, incidentally, was done unprepped.   This was a remarkably normal examination.  There was a  good vascular mucosal  pattern throughout the exam, including in the rectum.  There was smooth  glistening mucosa.  I did not appreciate any erythema, friability,  granularity, exudate, loss of vascularity, pseudopolyps, diverticular  disease, polyps, cancer or vascular ectasia.  Retroflexion in the rectum was  normal.  There was actually a tiny diminutive sessile hyperplastic-appearing  polyp which I excised with the biopsy forceps.  No larger polyps were seen,  however.  Random mucosal biopsies were taken in the sigmoid and rectum.   IMPRESSION:  Essentially normal examination in a patient with a several year  history of ulcerative colitis, and current symptoms of diarrhea and cramps  (5560).  The observed findings do not suggest active colitis.  Nor do they  explain the patient's abdominal pain or diarrhea.  There was no evidence of  pseudomembranes, incidentally, which  is pertinent since the patient had been  exposed to Augmentin about a month ago.   PLAN:  Await pathology results.  Supportive care while in the hospital  including empiric steroids because of the severe arthralgias and history of  autoimmune disorder.      RB/MEDQ  D:  04/01/2004  T:  04/04/2004  Job:  [id number]   cc:   Jeneen Rinks L. Rolla Flatten., M.D.  [address], [place]  [place]  [place] [address]  Fax: [place] Deterding, M.D.  [address]  [place]  [place] [address]  Fax: [phone]   Jeanann Lewandowsky, M.D.  [address] [place] [address]  Fax: [place]. Cherylann Banas, M.D.  [address], [place]  [place] [address]  Fax: [phone]   Fabio Asa, M.D.  [address]  [place]  [place] [address]  Fax: [phone]   Osvaldo Human, M.D.  [place]. [place]  [place] [address]  Fax: [phone]   Michael Litter, M.D.  [address]  [place]  [place] [address]  Fax: [phone]

## 2010-08-16 NOTE — Assessment & Plan Note (Signed)
Summary: F/U APPT...LSW.   History of Present Illness Visit Type: Follow-up Visit Primary GI MD: [name] MD Primary Provider: Mauricia Area, MD Requesting Provider: n/a Chief Complaint: F/u for rectal bleeding and bloating. Pt states that she is better and denies any GI complaints  History of Present Illness:   [name] with ulcerative colitis who has failed Humira and Enbrel as well as methotrexate. She is now awaiting Remicade approval from Dr Estanislado Pandy. She has been on a prednisone taper since her flareup in 2010. She is currently on prednisone 10 mg a day and is about 70% improved. The crampy abdominal pain, diarrhea and rectal bleeding has markedly improved. She has been able to taper down her prednisone from 30 mg down by 5 mg every 2 weeks. She had a positive PPD, however her chest x-rays were normal. She has a history of a Nissen fundoplication for which she takes Prilosec p.r.n. Patient's sister has Crohn's disease. 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 She is tolerating Asacol 4.8 g daily. Her last endoscopy and colonoscopy in February 2011 showed pancolitis which was moderately severe and reflux esophagitis.   GI Review of Systems      Denies abdominal pain, acid reflux, belching, bloating, chest pain, dysphagia with liquids, dysphagia with solids, heartburn, loss of appetite, nausea, vomiting, vomiting blood, weight loss, and  weight gain.        Denies anal fissure, black tarry stools, change in bowel habit, constipation, diarrhea, diverticulosis, fecal incontinence, heme positive stool, hemorrhoids, irritable bowel syndrome, jaundice, light color stool, liver problems, rectal bleeding, and  rectal pain.    Current Medications (verified): 1)  Levothroid 100 Mcg Tabs (Levothyroxine Sodium) .... Take 1 Tablet By Mouth Once A Day 2)  Pravachol 40 Mg Tabs (Pravastatin Sodium) .... Take 1 Tablet By Mouth Once A Day 3)  Prozac 20 Mg Caps  (Fluoxetine Hcl) .... Take 1 Tablet By Mouth Once A Day 4)  Dicyclomine Hcl 20 Mg Tabs (Dicyclomine Hcl) .... Take 1 Tablet By Mouth Three Times A Day As Needed 5)  Asacol 400 Mg Tbec (Mesalamine) .... Take 4 Tablets By Mouth Three Times A Day (Please D/c Prescription For 3 Tabs Three Times A Day) 6)  Prednisone 10 Mg  Tabs (Prednisone) .... 2 Tablets By Mouth Once Daily 7)  Alprazolam 0.25 Mg Tabs (Alprazolam) .... As Needed 8)  Vivelle-Dot 0.075 Mg/24hr Pttw (Estradiol) .... 2 X A Week  Allergies (verified): 1)  ! Erythromycin 2)  ! Demerol 3)  ! * Avelox 4)  ! * Imuran 5)  ! * Latex 6)  Doxycycline  Past History:  Past Medical History: Reviewed history from 08/24/2009 and no changes required. Current Problems:  IRRITABLE BOWEL SYNDROME (ICD-564.1) ARTHRITIS (ICD-716.90) TACHYCARDIA, HX OF (ICD-V12.50) FIBROCYSTIC BREAST DISEASE, HX OF (ICD-V13.9) GERD (ICD-530.81) PANCREATITIS, HX OF (ICD-V12.70) Hx of OVARIAN CYST (ICD-620.2) UNSPECIFIED HYPOTHYROIDISM (ICD-244.9) HYPERLIPIDEMIA (ICD-272.4) HYPERTENSION (ICD-401.9) PSORIASIS (ICD-696.1) ULCERATIVE COLITIS (ICD-556.9)    Past Surgical History: Reviewed history from 08/24/2009 and no changes required. Tubal Ligation Hysterectomy Repair of cystocele and rectocele Nissen Fundoplication Thyroid Ablation Left Ankle Reconstruction Bladder Tuck Back  Family History: Reviewed history from 08/30/2009 and no changes required. Family History of Crohn's: Sister Family History of Breast Cancer: Mother Family History of Heart Disease: Father (MI in his 54's), Mother Family History of Colon Polyps: Mother Family History of Irritable Bowel Syndrome: No FH of Colon Cancer:  Social History: Reviewed history from 08/30/2009 and no changes  required. 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 -quit in 2007 Alcohol Use - yes-rare Illicit Drug Use - no Daily Caffeine Use 3/day  Review of  Systems  The patient denies allergy/sinus, anemia, anxiety-new, arthritis/joint pain, back pain, blood in urine, breast changes/lumps, change in vision, confusion, cough, coughing up blood, depression-new, fainting, fatigue, fever, headaches-new, hearing problems, heart murmur, heart rhythm changes, itching, menstrual pain, muscle pains/cramps, night sweats, nosebleeds, pregnancy symptoms, shortness of breath, skin rash, sleeping problems, sore throat, swelling of feet/legs, swollen lymph glands, thirst - excessive , urination - excessive , urination changes/pain, urine leakage, vision changes, and voice change.         Pertinent positive and negative review of systems were noted in the above HPI. All other ROS was otherwise negative.   Vital Signs:  Patient profile:   45 year old female Height:      66 inches Weight:      199 pounds BMI:     32.24 BSA:     2.00 Pulse rate:   96 / minute Pulse rhythm:   regular BP sitting:   124 / 72  (left arm) Cuff size:   regular  Vitals Entered By: Hope Pigeon CMA (Nov 25, 2009 1:40 PM)   Impression & Recommendations:  Problem # 1:  ULCERATIVE COLITIS (ICD-556.9) Patient has ulcerative pancolitis as per her colonoscopy completed in February of this year. She has been  much improved on a prednisone taper, and Asacol 4.8 g a day. She is awaiting approval for Remicade. We will apply for Remicade as well since I am reluctant to cut back on her prednisone until she starts her Remicade. She will stay on 7.5 mg for 2 weeks and decrease by 2.5 mg every 2 weeks.  Problem # 2:  ARTHRITIS (ICD-716.90) Patient has psoriasis and is awaiting Remicade approval by Dr. Estanislado Pandy.  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 2)  Continue prednisone taper by 2.5 mg every 2 weeks. She is currently at 10 mg. 3)  Remicade request 5 mg/kg every 8 weeks with induction  regimen. 4)  Dicyclomine 20 mg p.r.n. 5)  Copy sent to : Dr Deterding 6)  OV 8 weeks 7)  The medication list was reviewed and reconciled.  All changed / newly prescribed medications were explained.  A complete medication list was provided to the patient / caregiver. Prescriptions: PREDNISONE 5 MG TABS (PREDNISONE) Take as directed  #100 x 0   Entered by:   Madlyn Frankel CMA (AAMA)   Authorized by:   Lafayette Dragon MD   Signed by:   [place] ([place]) on 11/25/2009   Method used:   Electronically to        [facility]       [address]       [place], [place]  [address]       Ph: [phone]       Fax: [phone]   RxID:   [id number]

## 2010-08-16 NOTE — Letter (Signed)
Summary: [facility] Instructions  [facility]  [place], [place] [address]   Phone: [phone]  Fax: [phone]       [name]    11/16/1965    MRN: [id number]       Procedure Day /Date: 09/09/09 Thursday     Arrival Time: 2:00 pm     Procedure Time: 3:00 pm     Location of Procedure:                    _ x_  [facility] (4th Floor)  [place]  Starting 5 days prior to your procedure 09/04/09 do not eat nuts, seeds, popcorn, corn, beans, peas,  salads, or any raw vegetables.  Do not take any fiber supplements (e.g. Metamucil, Citrucel, and Benefiber). ____________________________________________________________________________________________________   THE DAY BEFORE YOUR PROCEDURE         DATE: 09/08/09 DAY: Wednesday  1   Drink clear liquids the entire day-NO SOLID FOOD  2   Do not drink anything colored red or purple.  Avoid juices with pulp.  No orange juice.  3   Drink at least 64 oz. (8 glasses) of fluid/clear liquids during the day to prevent dehydration and help the prep work efficiently.  CLEAR LIQUIDS INCLUDE: Water Jello Ice Popsicles Tea (sugar ok, no milk/cream) Powdered fruit flavored drinks Coffee (sugar ok, no milk/cream) Gatorade Juice: apple, white grape, white cranberry  Lemonade Clear bullion, consomm, broth Carbonated beverages (any kind) Strained chicken noodle soup Hard Candy  4   Mix the entire bottle of Miralax with 64 oz. of Gatorade/Powerade in the morning and put in the refrigerator to chill.  5   At 3:00 pm take 2 Dulcolax/Bisacodyl tablets.  6   At 4:30 pm take one Reglan/Metoclopramide tablet.  7  Starting at 5:00 pm drink one 8 oz glass of the Miralax mixture every 15-20 minutes until you have finished drinking the entire 64 oz.  You should finish drinking prep around 7:30 or 8:00 pm.  8   If you are nauseated, you may take the 2nd Reglan/Metoclopramide  tablet at 6:30 pm.        9    At 8:00 pm take 2 more DULCOLAX/Bisacodyl tablets.        THE DAY OF YOUR PROCEDURE      DATE:  09/09/09 DAY: Thursday  You may drink clear liquids until 1:00 pm (2 HOURS BEFORE PROCEDURE).   MEDICATION INSTRUCTIONS  Unless otherwise instructed, you should take regular prescription medications with a small sip of water as early as possible the morning of your procedure.         OTHER INSTRUCTIONS  You will need a responsible adult at least 45 years of age to accompany you and drive you home.   This person must remain in the waiting room during your procedure.  Wear loose fitting clothing that is easily removed.  Leave jewelry and other valuables at home.  However, you may wish to bring a book to read or an iPod/MP3 player to listen to music as you wait for your procedure to start.  Remove all body piercing jewelry and leave at home.  Total time from sign-in until discharge is approximately 2-3 hours.  You should go home directly after your procedure and rest.  You can resume normal activities the day after your procedure.  The day of your procedure you should not:   Drive   Make  legal decisions   Operate machinery   Drink alcohol   Return to work  You will receive specific instructions about eating, activities and medications before you leave.   The above instructions have been reviewed and explained to me by   [name], CMA    I fully understand and can verbalize these instructions _____________________________ Date 08/30/09

## 2010-08-16 NOTE — Letter (Signed)
Summary: [facility]   Imported By: Bubba Hales 05/16/2010 09:05:00  _____________________________________________________________________  External Attachment:    Type:   Image     Comment:   External Document

## 2010-08-16 NOTE — Op Note (Signed)
Summary: COLON (Dr Oletta Lamas)   NAME:  ROSSELYN, MARTHA               ACCOUNT NO.:  [id number]   MEDICAL RECORD NO.:  [id number]          PATIENT TYPE:  AMB   LOCATION:  ENDO                         FACILITY:  [facility]   PHYSICIAN:  James L. Rolla Flatten., M.D.DATE OF BIRTH:  13-Feb-1966   DATE OF PROCEDURE:  04/13/2004  DATE OF DISCHARGE:                                 OPERATIVE REPORT   PROCEDURE:  Colonoscopy and biopsy.   MEDICATIONS:  Fentanyl 100 mcg, Versed 10 mg IV.   SCOPE:  Olympus pediatric colonoscope.   INDICATIONS:  An unfortunate 45 year old woman with documented ulcerative  colitis with multiple extra-intestinal manifestations complicated by some  sort of autoimmune disorder including autoimmune thyroiditis. She was in the  hospital recently with abdominal pain, weakness, tremendous aching.  Sigmoidoscopy showed no active colitis. CT scan of the abdomen and pelvis  was negative. Subsequent upper GI and small-bowel follow through was  negative for ulceration, Crohn's disease, or any signs of bowel obstruction.  The patient has continued to have vague right sided pain, has been on  steroids for her extra-intestinal manifestations of inflammatory bowel  disease versus rheumatologic symptoms, and has had a slightly elevated white  count and low grade fever in spite of this. Due to the fact that she is  continuing to have right sided abdominal pain and the fact that her right  colon has not been evaluated recently, and the fact that she has had  increased bloating, a colonoscopy is performed.   DESCRIPTION OF PROCEDURE:  Procedure had been explained to the patient and  consent obtained. Placed in the left lateral decubitus position, and Olympus  scope was inserted and advanced. Prep was adequate. She had a fairly quick  prep due to her bloated abdomen yesterday and had not been on extensive  clear liquids, so there was some solid pieces of stool but really 95% of the  mucosa  was seen well. The mucosa throughout the colon was completely  endoscopically normal and easily reached the cecum. I, in particle, examined  the appendiceal orifice. There was concern over right sided abdominal pain  and possible appendicitis. The appendiceal orifice was totally normal, and  no inflammation or any signs of acute appendicitis. The ileocecal valve was  identified and entered. I entered the terminal ileum for 15 cm. This was  endoscopically normal. Several random biopsies were taken for microscopic  signs of inflammatory bowel disease _______________. These were placed in  jar #1. The scope was withdrawn, and the mucosa of the ascending, transverse  colon, descending, and sigmoid colon was completely endoscopically normal as  in the rectum, with a good vascular pattern, no edema, no signs of active  ulcerative colitis or Crohn's. Multiple biopsies were taken upon removal  throughout the colon and placed in jar #2. Scope was withdrawn, and the  patient tolerated the procedure well.   ASSESSMENT:  1.  History of ulcerative colitis, currently in remission by all      radiographic and endoscopic criteria, 556.9.  2.  Right lower quadrant pain of unclear cause, 789.03.  PLAN:  Will check pathology. Will start on MiraLax 1 glass daily. See back  in the office in 3 to 4 weeks. [facility] will be [facility] an  opinion from the GI department there regarding her inflammatory bowel  disease, and I will go ahead and decrease her prednisone 10 mg a day.      JLE/MEDQ  D:  04/13/2004  T:  04/13/2004  Job:  [id number]   cc:   Osvaldo Human, M.D.  [place] [place]  [place] [address]  Fax: Sterling Deterding, M.D.  [address]  [place]  [place] [address]  Fax: [phone]   Michael Litter, M.D.  [place]  [place] [address]  Fax: [phone]   Jeanann Lewandowsky, M.D.  [address], [place] [address]  Fax:  [place]. Cherylann Banas, M.D.  [address] [place]  [place] [address]  Fax: [phone]   Fabio Asa, M.D.  [address] [place]  [place] [address]  Fax: [phone]

## 2010-08-16 NOTE — Miscellaneous (Signed)
Summary: rx for canesa  Clinical Lists Changes  Medications: Added new medication of CANASA 1000 MG  SUPP (MESALAMINE) daily at bedtime - Signed Rx of CANASA 1000 MG  SUPP (MESALAMINE) daily at bedtime;  #30 x 1;  Signed;  Entered by: Hanley Seamen;  Authorized by: Lafayette Dragon MD;  Method used: Electronically to [facility] [address] [place], [place], [place]  [address], Ph: [phone], Fax: [phone]    Prescriptions: CANASA 1000 MG  SUPP (MESALAMINE) daily at bedtime  #30 x 1   Entered by:   Hanley Seamen   Authorized by:   Lafayette Dragon MD   Signed by:   Hanley Seamen on 09/09/2009   Method used:   Electronically to        [facility]       [address]       [place], [place]  [address]       Ph: [phone]       Fax: [phone]   RxID:   [id number]

## 2010-08-16 NOTE — Assessment & Plan Note (Signed)
Summary: F/U APPT...LSW.   History of Present Illness Visit Type: Follow-up Visit Primary GI MD: [name] MD Primary Provider: Mauricia Area, MD Requesting Provider: n/a Chief Complaint: F/u for ulcerative colitis. Pt c/o pain on left side upper and lower History of Present Illness:   [name] with ulcerative colitis who has failed Humira, Enbrel and methotrexate. Her last office visit was on 11/25/09. 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 An upper endoscopy and colonoscopy in February 2011 showed pancolitis and mild reflux esophagitis. She has tapered her prednisone from 10 mg to no Prednisone.. She has been off steroids for the past 4 weeks. She is doing well. She denies diarrhea or rectal bleeding. She is having some left lower and middle quadrant abdominal pain.   GI Review of Systems    Reports abdominal pain.     Location of  Abdominal pain: left side.    Denies acid reflux, belching, bloating, chest pain, dysphagia with liquids, dysphagia with solids, heartburn, loss of appetite, nausea, vomiting, vomiting blood, weight loss, and  weight gain.        Denies anal fissure, black tarry stools, change in bowel habit, constipation, diarrhea, diverticulosis, fecal incontinence, heme positive stool, hemorrhoids, irritable bowel syndrome, jaundice, light color stool, liver problems, rectal bleeding, and  rectal pain.    Current Medications (verified): 1)  Levothroid 100 Mcg Tabs (Levothyroxine Sodium) .... Take 1 Tablet By Mouth Once A Day 2)  Pravachol 40 Mg Tabs (Pravastatin Sodium) .... Take 1 Tablet By Mouth Once A Day 3)  Asacol 400 Mg Tbec (Mesalamine) .... Take 4 Tablets By Mouth Three Times A Day Must Have Office Visit For Further Refills! 4)  Alprazolam 0.25 Mg  Tabs (Alprazolam) .... As Needed 5)  Vivelle-Dot 0.075 Mg/24hr Pttw (Estradiol) .... 2 X A Week  Allergies (verified): 1)  ! Erythromycin 2)  ! Demerol 3)  ! * Avelox 4)  ! * Imuran 5)  ! * Latex 6)  Doxycycline  Past History:  Past Medical History: Reviewed history from 08/24/2009 and no changes required. Current Problems:  IRRITABLE BOWEL SYNDROME (ICD-564.1) ARTHRITIS (ICD-716.90) TACHYCARDIA, HX OF (ICD-V12.50) FIBROCYSTIC BREAST DISEASE, HX OF (ICD-V13.9) GERD (ICD-530.81) PANCREATITIS, HX OF (ICD-V12.70) Hx of OVARIAN CYST (ICD-620.2) UNSPECIFIED HYPOTHYROIDISM (ICD-244.9) HYPERLIPIDEMIA (ICD-272.4) HYPERTENSION (ICD-401.9) PSORIASIS (ICD-696.1) ULCERATIVE COLITIS (ICD-556.9)    Past Surgical History: Reviewed history from 08/24/2009 and no changes required. Tubal Ligation Hysterectomy Repair of cystocele and rectocele Nissen Fundoplication Thyroid Ablation Left Ankle Reconstruction Bladder Tuck Back  Family History: Reviewed history from 08/30/2009 and no changes required. Family History of Crohn's: Sister Family History of Breast Cancer: Mother Family History of Heart Disease: Father (MI in his 371's, Mother Family History of Colon Polyps: Mother Family History of Irritable Bowel Syndrome: No FH of Colon Cancer:  Social History: Reviewed history from 08/30/2009 and no changes required. 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 -quit in 2007 Alcohol Use - yes-rare Illicit Drug Use - no Daily Caffeine Use 3/day  Review of Systems       The patient complains of allergy/sinus.  The patient denies anemia, anxiety-new, arthritis/joint pain, back pain, blood in urine, breast changes/lumps, change in vision, confusion, cough, coughing up blood, depression-new, fainting, fatigue, fever, headaches-new, hearing problems,  heart murmur, heart rhythm changes, itching, menstrual pain, muscle pains/cramps, night sweats, nosebleeds,  pregnancy symptoms, shortness of breath, skin rash, sleeping problems, sore throat, swelling of feet/legs, swollen lymph glands, thirst - excessive , urination - excessive , urination changes/pain, urine leakage, vision changes, and voice change.         Pertinent positive and negative review of systems were noted in the above HPI. All other ROS was otherwise negative.   Vital Signs:  Patient profile:   45 year old female Height:      66 inches Weight:      201 pounds BMI:     32.56 BSA:     2.01 Pulse rate:   88 / minute Pulse rhythm:   regular BP sitting:   126 / 74  (left arm) Cuff size:   regular  Vitals Entered By: Hope Pigeon CMA (February 28, 2010 8:12 AM)  Physical Exam  General:  appears depressed. Eyes:  PERRLA, no icterus. Mouth:  No deformity or lesions, dentition normal. Neck:  Supple; no masses or thyromegaly. Lungs:  Clear throughout to auscultation. Heart:  Regular rate and rhythm; no murmurs, rubs,  or bruits. Abdomen:  soft abdomen mildly obese. Normoactive bowel sounds. Diffusely tender moreso in the left lower and left middle quadrant. No palpable mass. Rectal:  normal rectal tone with formed Hemoccult-negative stool. Extremities:  no edema. Skin:  scaly psoriatic rash on the palms of her hands and feet. Psych:  appears depressed.   Impression & Recommendations:  Problem # 1:  ULCERATIVE COLITIS (ICD-556.9) Patient has ulcerative colitis in remission and off prednisone. She is on Remicade and Asacol. She may decrease her Asacol to 9 tablets a day to a total of 3.6 g daily. I will see her in 3 months.  Problem # 2:  IRRITABLE BOWEL SYNDROME (ICD-564.1) Patient takes Bentyl 20 mg p.r.n.  Problem # 3:  GERD (ICD-530.81) Patient is status post Nissen fundoplication. No symptoms.  Patient Instructions: 1)  reduce Asacol to 3.6 g daily. 2)  Continue Remicade 5 mg/kg every 6 weeks as per Dr.Deveshwar. 3)  Office visit 3 months. 4)  Copy sent to : Mauricia Area, MD 5)  The medication list was reviewed and reconciled.  All changed / newly prescribed medications were explained.  A complete medication list was provided to the patient / caregiver.

## 2010-08-16 NOTE — Progress Notes (Signed)
Summary: Prednisone  ---- Converted from flag ---- ---- 09/14/2009 8:19 PM, Lafayette Dragon MD wrote: Hilda Blades, please  call in Prednisone 38m, #100, 3 by mouth once daily (30 mg) x 2 weeks then 25 mg by mouth once daily, I told her to make an appointment ------------------------------  Phone Note Outgoing Call   Call placed by: DVivia EwingLPN,  March  2, 251838:13 AM Call placed to: Patient Summary of Call: Message left for pt. to let her know the script for Prednisone was sent to her pharmacy and I asked her to call to schedule an OV w/Dr.Josimar Corning. Pt. instructed to call back as needed.  Initial call taken by: DVivia EwingLPN,  March  2, 235828:16 AM    New/Updated Medications: PREDNISONE 10 MG  TABS (PREDNISONE) Take 3 (324m by mouth once daily for 2 weeks, then decrease to 2578maily Prescriptions: PREDNISONE 10 MG  TABS (PREDNISONE) Take 3 (58m42my mouth once daily for 2 weeks, then decrease to 25mg54mly  #100 x 1   Entered by:   DeborVivia Ewing  Authorized by:   Jessica Checketts Lafayette Dragon Signed by:   DeborVivia Ewingon 09/249/89/8421thod used:   Electronically to        [facility]       [address]  [place] [id number]  Ph: [phone]  Fax: [phone]:   [id number]

## 2010-08-18 NOTE — Letter (Signed)
Summary: Joyice Faster Deterding MD/[facility]  Joyice Faster Deterding MD/[facility]   Imported By: Bubba Hales 08/02/2010 07:34:20  _____________________________________________________________________  External Attachment:    Type:   Image     Comment:   External Document

## 2010-11-29 NOTE — Discharge Summary (Signed)
NAMESEHAM, GARDENHIRE               ACCOUNT NO.:  [id number]   MEDICAL RECORD NO.:  [id number]          PATIENT TYPE:  INP   LOCATION:  5734                         FACILITY:  [facility]   PHYSICIAN:  Lear Ng, MDDATE OF BIRTH:  06/14/1966   DATE OF ADMISSION:  01/08/2007  DATE OF DISCHARGE:                               DISCHARGE SUMMARY   DISCHARGE DIAGNOSES:  1. Ileus.  2. Ulcerative colitis.  3. History of chronic pancreatitis.  4. History of psoriasis.   DISCHARGE MEDICINES:  1. Metoprolol 50 mg b.i.d.  2. Synthroid 100 mcg daily.  3. Protonix 40 mg daily.  4. Cyclosporin 125 mg p.o. b.i.d.  5. Vytorin 10/40 mg p.o. daily.   DISCHARGE CONDITION:  Improved.   HISTORY OF PRESENT ILLNESS:  Ms. Burgen was directly admitted from Dr.  Oletta Lamas' office on January 08, 2007 due to abdominal pain and having a  leukocytosis with white blood count of 13; that white blood count was  drawn at her nephrologist's office, Dr. Jimmy Footman, and due to her severe  epigastric and left upper quadrant abdominal pain, she was admitted to  the hospital for further management.  She did well in the hospital with  clear liquid diet and began moving her bowels on January 10, 2007.  During  her hospitalization, she initially had abdominal x-ray done which showed  mild jejunal ileus, but no evidence of obstruction.  A CAT scan was then  done which showed no acute abnormality.  It did show mild sigmoid  diverticulosis, 2 left ovarian cysts and evidence of a previous Nissen  fundoplication.  Ms. Larsen did well with a quick recovery from her  abdominal pain, which resolved.  Her elevated white blood count also  resolved within 24 hours of hospitalization.  She was able to tolerate  clear liquids and this was advanced to a regular diet, which she  tolerated as well.  She was moving her bowels well and the pain and  resolved.  She was stable for discharge on January 10, 2007.  On day of  discharge, her  cyclosporin level, which was ordered, is pending and she  will follow up with Dr. Jimmy Footman on this as an outpatient.   DISCHARGE FOLLOWUP:  Follow up with Dr. Laurence Spates on July 24 at  10:45 a.m.  Follow up with Dr. Jimmy Footman in 1 week and she should call  his office to arrange that.   DISCHARGE ACTIVITY:  No restrictions.   DISCHARGE DIET:  No restrictions.      Lear Ng, MD  Electronically Signed     VCS/MEDQ  D:  01/10/2007  T:  01/11/2007  Job:  [id number]   cc:   Jeneen Rinks L. Deterding, M.D.  James L. Rolla Flatten., M.D.  Michael Litter, M.D.

## 2010-11-29 NOTE — H&P (Signed)
[name], [name]               ACCOUNT NO.:  [id number]   MEDICAL RECORD NO.:  [id number]          PATIENT TYPE:  INP   LOCATION:  5734                         FACILITY:  [facility]   PHYSICIAN:  James L. Rolla Flatten., M.D.DATE OF BIRTH:  12/15/1965   DATE OF ADMISSION:  01/08/2007  DATE OF DISCHARGE:                              HISTORY & PHYSICAL   REASON FOR ADMISSION:  Abdominal pain.   HISTORY:  A 45 year old woman with a long history of ulcerative colitis.  She has been on chronic cyclosporin and her disease has been fairly well-  controlled on that.  She comes in with 1 day of severe upper abdominal  pain; has not passed air, had only a small bowel movement yesterday.  She has felt nauseous and crampy, and feels like she is going to throw  up.  She had an acute abdominal series today, which showed a possible  slight ileus, no clear obstruction, no free air.  Her white count on  labs done from the nephrology office revealed a white count of 13.  The  patient feels that she is unable to keep down medications and feels  quite sick   Other pertinent labs reveal a normal urinalysis.  Normal amylase and  lipase, and a normal CMET.   CURRENT MEDICATIONS:  1. Metoprolol 50 mg b.i.d.  2. Synthroid 100 mcg q.a.m.  3. Protonix 40 mg q.a.m.  4. Cyclosporin 125 mg b.i.d.  5. Vytorin 10/40 one q.a.m.   ALLERGIES:  DOXYCYCLINE, E-MYCIN, DEMEROL, AVELOX., LATEX, IMURAN.   MEDICAL HISTORY:  History of ulcerative colitis.  This has been  relatively asymptomatic since she has been on Imuran.  She has chronic  psoriasis joint pains and sees Dr. Oval Linsey, the question is if  the joint pains are due to inflammatory bowel disease, psoriasis, lupus  or some other combination of all these things.  She has hypertension;  has reflux and has undergone previous Nissen fundoplication.  She has a  history of chronic pancreatitis, with multiple relapses; initially were  felt to be due to  Imuran, but she has not been on Imuran for a number of  years and still had mild bouts of pancreatitis.   Workup has included multiple gallbladder tests, which were negative.  She has had a workup for autoimmune pancreatitis, which was negative.   PAST SURGICAL HISTORY:  The only previous surgeries include Nissen  fundoplication, hysterectomy, cystocele and rectocele repair.   FAMILY HISTORY:  Negative for gallstones.  Positive for colon polyps.   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  She used to smoke; quite in 2007.  Drinks rarely, if at all.  She is  married.   REVIEW OF SYSTEMS:  Lack of diarrhea, bleeding, etc.   PHYSICAL EXAMINATION:  VITAL SIGNS:  Temperature 98, pulse 104, blood  pressure 106/70, weight 176.  GENERAL: Pleasant white female, nonicteric.  She is in obvious abdominal  pain.  HEENT:  Eyes: Sclerae nonicteric.  Extraocular movements intact.  Throat: Mucous membranes were dry.  HEART:  Regular rate  and rhythm without murmurs or gallops.  LUNGS: Clear.  ABDOMEN:  Soft and quiet, with tenderness in the epigastrium and left  upper quadrant.  The stool was brown and heme-negative.   ASSESSMENT:  Vague abdominal pain, low-grade fever, elevated white count  -- questionable cause.   PLAN:  Will place the patient on an evaluation status.  Check labs again  with possible cyclosporin level.  Will go ahead with a CT scan of the  abdomen, with an oral and IV contrast.           ______________________________  Joyice Faster. Rolla Flatten., M.D.     [name]  D:  01/08/2007  T:  01/09/2007  Job:  [id number]   cc:   Jeneen Rinks L. Deterding, M.D.

## 2010-12-02 NOTE — H&P (Signed)
NAME:  [name], [name]                      ACCOUNT NO.:  [id number]   MEDICAL RECORD NO.:  [id number]                   PATIENT TYPE:  INP   LOCATION:  5533                                 FACILITY:  [facility]   PHYSICIAN:  James L. Rolla Flatten., M.D.          DATE OF BIRTH:  1965/08/27   DATE OF ADMISSION:  06/23/2002  DATE OF DISCHARGE:                                HISTORY & PHYSICAL   REASON FOR ADMISSION:  Recurrent pancreatitis.   HISTORY OF PRESENT ILLNESS:  A 45 year old white female who has ulcerative  colitis diagnosed in August 2003 by colonoscopy with fairly severe  inflammation of the left colon.  The right colon was endoscopically normal.  Pathology, however, showed active inflammation.  This was treated initially  with prednisone 5 and ASA.  As soon as we tapered the prednisone, she had a  flare.  She has been on Asacol since approximately August.  We had to place  her back on a higher dose of prednisone after it became obvious we would be  unable to easily taper her.  She was started on Imuran on November 20.  Six  days later, she had left upper quadrant pain, nausea that got progressively  worse, was worsened with eating.  Labs obtained in Dr. Deterding's office  revealed elevated amylase and lipase.  The patient was diagnosed with  pancreatitis and was admitted to the hospital.  She rapidly improved.  Ultrasound was done showing no gallstones and no gross abnormalities of the  pancreas.  She got better, was tolerating clear liquids, and went home for  Thanksgiving.  I saw her back in the office on Thursday, three days ago.  She was tolerating liquids well and was doing well without any further pain.  She has been off Imuran since her discharge.  Amylase and lipase had been  check and were normal, and we, therefore, started her back on a complex  carbohydrate diet which she just started Saturday.  Within several hours  after eating pasta and pasta alone, she began  to experience epigastric pain  and nausea.  It went through to her back, and she did not really feel like  eating.  She has continued to be nauseated, had severe pain yesterday, but  it did get a bit better as long as she did not eat.  She came into the renal  office today, had labs drawn at the renal office with finding of white count  slightly elevated at 12.0, totally normal liver function tests with normal  alkaline phosphatase, SGOT, SGPT.  Her amylase was 257 and lipase 497  whereas these had previously been normal since her last hospitalization.  The patient is still continuing to feel terrible and is admitted with a  diagnosis of recurrent pancreatitis.   CURRENT MEDICATIONS:  1. Lipitor 20 mg q.d.  2. Levothyroxine 25 mcg q.d.  3. Prednisone 15 mg q.d.  4. Valium 5 mg  q.h.s.  5. Asacol 800 mg t.i.d.  6. Tramadol 50 mg p.r.n.  7. Tetracycline and mycins.   PAST MEDICAL HISTORY:  1. Ulcerative colitis diagnosed by biopsy August 2003.  2. Chronic esophageal reflux status post laparoscopic Nissen fundoplication     in January 2003.  3. History of cardiac chest pain felt to be due to reflux.  This has been     worked up in the past including catheterization by Dr. Jaci Standard.  4. History of chronic joint pains and aches which were felt to have been     secondary to inflammatory bowel disease.  5. Status post bilateral tubal ligation, hysterectomy, oophorectomy.   FAMILY HISTORY:  The patient has a sister in her 35s who has Crohn's  disease.  There is heart disease in the family, hypertension, breast cancer.   SOCIAL HISTORY:  She has two children who are healthy .  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  She is married, does not smoke except  occasionally and does not drink.   REVIEW OF SYSTEMS:  Primarily as mentioned above.  She has been cushingoid,  gaining weight.  She had been doing better until recently when she started  on the pasta, and that caused her to develop  symptoms again.   PHYSICAL EXAMINATION:  VITAL SIGNS:  Not currently available.  GENERAL:  Obese, cushingoid white female in no acute distress.  HEENT:  Eyes: Sclerae nonicteric.  NECK:  Supple with no lymphadenopathy.  LUNGS:  Clear.  ABDOMEN:  Nondistended with few bowel sounds present.  Generally soft with  localized tenderness in the epigastrium.  EXTREMITIES:  Without edema.   ASSESSMENT:  Recurrent pancreatitis.  She has been off Imuran for 10 days  now.  I am concerned that she may have developed a pseudocyst, initial  pancreatitis, or that one or another drug she is taking could be  contributing.  Steroids are well known to be associated with a drug-induced  pancreatitis.  We had pharmacy do a check, and other mediations including  Asacol and Lipitor have both been associated with a drug-induced  pancreatitis.   PLAN:  Will admit her to the hospital and keep her off all of these drugs.  We will need to give her some IV steroids.  We will obtain a CT of the  abdomen and pelvis  and go from there.                                               James L. Rolla Flatten., M.D.    [name]  D:  06/23/2002  T:  06/23/2002  Job:  [id number]   cc:   Jeneen Rinks L. Deterding, M.D.  [place] [address]  Fax: [phone]   Osvaldo Human, M.D.  [place] [place]  [place] [address]  Fax: [phone]

## 2010-12-02 NOTE — Discharge Summary (Signed)
[name], [name]               ACCOUNT NO.:  [id number]   MEDICAL RECORD NO.:  [id number]          PATIENT TYPE:  INP   LOCATION:  5511                         FACILITY:  [facility]   PHYSICIAN:  James L. Rolla Flatten., M.D.DATE OF BIRTH:  30-Aug-1965   DATE OF ADMISSION:  04/01/2004  DATE OF DISCHARGE:  04/05/2004                                 DISCHARGE SUMMARY   ADMITTING DIAGNOSES:  Abdominal pain, cramps, and diarrhea in a woman with  history of ulcerative colitis.   FINAL DIAGNOSES:  1.  Abdominal pain, cramps, and diarrhea, improved.  Etiology undetermined.  2.  History of severe pan colitis carrying a diagnosis of ulcerative colitis      with sigmoidoscopy on this admission showing the disease to be in      remission in the left colon.  3.  Multiple arthralgias presented to be extraintestinal manifestations of      ulcerative colitis.  4.  History of pancreatitis secondary to Imuran.  5.  History of gastroesophageal reflux disease with esophagitis by endoscopy      despite previous Nissen fundoplication.  6.  Status post hysterectomy, right oophorectomy, left ovarian cystectomy      with rectocele/cystocele repair in the past.  7.  Cholecystectomy in 2004 due to adenomatosis of the gallbladder.  8.  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  9.  History of recent increased creatinine secondary to cyclosporin use.  10. History of autoimmune disorder with autoimmune thyroiditis followed by      Dr. Justine Null.  11. History of hyperlipidemia.  12. Hypertension.  13. History of eczema presumed secondary to inflammatory bowel disease.   MEDICATIONS ON ADMISSION:  1.  Cipro.  2.  Synthroid.  3.  Toprol.  4.  Flagyl.  5.  Protonix.  6.  Prednisone.  7.  Hydrocodone.  8.  Lasix every other day.  9.  She had been on cyclosporin but that had been on hold due to a rising      creatinine.   ALLERGIES:  IMURAN, DOXYCYCLINE, ERYTHROMYCIN, DEMEROL.   PROCEDURE:  1.  Flexible sigmoidoscopy.  2.  CT scan of the abdomen and pelvis.   CONSULTS:  1.  Dr. Jeneen Rinks Deterding of nephrology  2.  Dr. Thomos Lemons of rheumatology   HISTORY:  A 45 year old female with ulcerative colitis with a colonoscopy  about a year and a half ago showing the disease to be in remission who has  been bothered with rashes and extraintestinal manifestations, primarily  arthralgias.  She had been treated with Imuran, developed Imuran-induced  pancreatitis actually requiring home TNA.  This finally resolved.  She was  placed on cyclosporin.  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  Her creatinine began to rise and her  cyclosporin dose had to be lowered from 200 mg to 50 mg a day and she came  in with abdominal cramps and diarrhea with associated increased hip  and  joint pain which began several days prior to her admission.  She also had  low grade fevers and had been started on antibiotics.  She had had no blood  in her stool, but had been on some Augmentin a month earlier for a  respiratory tract infection.   PHYSICAL EXAMINATION:  VITAL SIGNS:  Temperature 98.9, respirations 18,  heart rate 110, blood pressure 136/70.  GENERAL:  She is a well-developed, well-nourished female in mild distress.  HEENT:  Normal.  NECK:  Normal.  HEART:  Normal.  LUNGS:  Normal.  ABDOMEN:  Some diminished bowel sounds.  Mildly tender.  No peritoneal  signs.   For more details, please see dictated admission history and physical.   HOSPITAL COURSE:  Patient was admitted to medical floor.  Routine  laboratories were obtained.  IV fluids were started.  She was maintained on  her outpatient medications.  Her initial laboratories revealed a white count  of 8.8, hemoglobin of 12.2, sedimentation rate of 25, normal electrolytes  with an elevated glucose of 116, albumin of  3.9.  Phosphorous was slightly  low at 1.7.  Amylase and lipase were normal.  TSH was in the normal range at  0.38.  Urinalysis was normal.  Blood/urine cultures were obtained and are  negative at the time of this dictation.  The patient underwent a flexible  sigmoidoscopy by Dr. Cristina Gong the splenic flexure with a normal mucosa, no  signs of active colitis.  Biopsies subsequently returned showing normal  mucosa with no evidence of ulcerative colitis.  Patient continued to  improve, still had somewhat loose stools and joint pains.  Had been started  on Solu-Medrol initially.  This was changed to oral prednisone.  Felt much  better.  She continued to intermittently have fevers and arthralgias but  these gradually improved with the steroids.  She was tolerating a bland diet  without problems.  We went ahead with a CT of the abdomen and pelvis which  showed no thickening of the bowel at all, no evidence of colitis or Crohn's  disease.  She was changed over to completely oral medications.  She was seen  in consultation by Dr. Kristen Loader who had been following her for some time.  It was not clear what was causing her severe arthralgias and weakness, most  likely some sort of autoimmune condition probably connected with  inflammatory bowel disease.  It was felt she could be treated with  prednisone, followed up in the office with considerations for other  medications.  The patient had been on __________ preparations but these were  stopped at some point in the past.  Have discussed with her the need to stay  on these since her disease was in remission in hopes of maintaining at least  a colonic remission.  Her condition was improved.  She was feeling better.  It was felt she could continue to be followed closely as an outpatient.   DISPOSITION:  The patient is discharged home on her regular diet.   DISCHARGE MEDICATIONS:  1.  Prednisone 20 mg daily.  2.  Asacol 400 mg two t.i.d. 3.  Toprol 100  mg daily.  4.  Synthroid 100 mcg daily.  5.  Protonix 40 mg daily.  6.  Ambien 5 mg at night q.h.s.  7.  She will remain off the cyclosporin or any other antimetabolites for      now.   A small-bowel series will be arranged as an  outpatient to rule out Crohn's  disease.  We will continue to pursue an evaluation at the [facility] at [facility].  She will see Dr. Oletta Lamas in the office in  two weeks, will arrange an appointment, and Dr. Justine Null in one to two weeks.       JLE/MEDQ  D:  04/05/2004  T:  04/05/2004  Job:  [id number]   cc:   Michael Litter, M.D.  [place]  [place] [address]  Fax: Slovan Deterding, M.D.  [place]  [place] [address]  Fax: [phone]

## 2010-12-02 NOTE — Discharge Summary (Signed)
[facility]  Patient:    [name], [name] Visit Number: [id number] MRN: [id number]          Service Type: SUR Location: [address] Attending Physician:  Darrelyn Hillock Dictated by:   Darrelyn Hillock, M.D. Admit Date:  07/24/2001 Discharge Date: 07/26/2001                             Discharge Summary  ADMISSION DIAGNOSIS:  Gastroesophageal reflux disease.  DISCHARGE DIAGNOSIS:  Gastroesophageal reflux disease.  CONDITION ON DISCHARGE:  Good and improved.  CONSULTATION:  Dr. Leonie Douglas.  PROCEDURE:  Laparoscopic Nissen fundoplication.  HISTORY OF PRESENT ILLNESS:  For H&P, please see the chart.  HOSPITAL COURSE:  The patient was admitted and underwent laparoscopic Nissen fundoplication.  She did very well.  She was left NPO the day of surgery. Postoperatively, she underwent Gastrografin swallow which showed no leaks. She was advanced to a clear liquid diet.  She tolerated this well.  She was ambulating and anxious to go home on postop day #2.  She was discharged to home.  DISCHARGE MEDICATIONS: 1. Vicodin. 2. Aciphex. 3. Synthroid. 4. Cardizem, as she was on preoperatively.  SPECIAL INSTRUCTIONS:  I have stopped the Reglan.  FOLLOWUP:  Follow up with Dr. Truitt Leep in seven days for staple removal. Dictated by:   Darrelyn Hillock, M.D. Attending Physician:  Janeece Agee R. DD:  07/26/01 TD:  07/27/01 Job: [id number]

## 2010-12-02 NOTE — H&P (Signed)
NAME:  [name], [name]                      ACCOUNT NO.:  [id number]   MEDICAL RECORD NO.:  [id number]                   PATIENT TYPE:  INP   LOCATION:  3036                                 FACILITY:  [facility]   PHYSICIAN:  James L. Deterding, M.D.            DATE OF BIRTH:  [birth year]   DATE OF ADMISSION:  06/11/2002  DATE OF DISCHARGE:                                HISTORY & PHYSICAL   REASON FOR ADMISSION:  Acute pancreatitis.   HISTORY:  This is a 45 year old woman who has ulcerative colitis.  This was  diagnosed in August of 2003. Se had a colonoscopy at that time, had obvious,  fairly severe inflammation in the left colon, but the right colon appeared  endoscopically normal.  However, path of the right and transverse colon did  show active inflammation in the right transverse colon.  She was initially  treated with Prednisone and 5ASA preparation, and we tapered this, and  unfortunately, as soon as we tapered it she had a flare and had to be placed  back on higher dose Prednisone.  It was obvious we would not be able to  taper her from the Predinsone easily so I started her on Imuran 50 mg daily.  This was started on October 30.  She had routine labs for screening purposes  for follow up for any reaction to Imuran and on November 20, it revealed a  normal CBC and CMET.  I saw her in the office four days ago and at that time  she was feeling fine, having no diarrhea, no bleeding, no abdominal pain.  We tapered her Prednisone down from 20 to 15 mg daily.  The patient felt  fine following that visit until yesterday morning when she was awakened with  vague abdominal queasiness that got progressively worse.  She developed  epigastric and left upper quadrant pain, and Dr. Jimmy Footman in the office  yesterday.  Labs were obtained which revealed an amylase of 201 and a lipase  of 177.  Pain has gotten progressively worse.  She has only been taking  small amounts of liquids.  She has  not had any vomiting, but really cannot  vomit due to her previous laparoscopic Nissen.  She has not had any diarrhea  or bleeding, no fever or chills.  She really has not been able to eat  anything.  She presented to the office with this history of worsening pain.  It is notable that she had a gallbladder ultrasound in 2002 showing no  gallstones or any other abnormalities.   CURRENT MEDICATIONS:  1. Lipitor 2 mg q. day.  2. Levothyroxine 25 mcg q. day.  3. Prednisone 50 mg q. day.  4. Valium 5 mg q. h.s.  5. Asacol 800 mg t.i.d.  6. Imuran 50 mg q.d.  7. Tramadol 50 mg p.r.n.   ALLERGIES:  She is allergic to tetracycline and mycins.   PAST MEDICAL  HISTORY:  1. Chronic esophageal reflux, had a laparoscopic Nissen in January of 2003.  2. Had a history of chest pain that apparently was non-cardiac although she     did undergo a catheterization in the past by Dr. Jaci Standard.  3. History of chronic joint pains and rash which at this point in time have     been felt to be due to inflammatory bowel disease and has improved with     treatment of the inflammatory bowel disease.  4. Status post bilateral tubal ligation, hysterectomy and oophorectomy.   FAMILY HISTORY:  Patient has a sister in her 63s who has Crohn's disease.  Heart disease in the family.  Hypertension.  Breast cancer in the family but  no GI cancer.  Her two children are healthy.   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  She does not drink, she smokes occasionally.   REVIEW OF SYSTEMS:  Remarkable primarily for issues regarding her  Prednisone, weight gain, cushingoid features.  Her chest pain has not  returned.  She is not having any further reflux.  No other specific  symptoms.   PHYSICAL EXAMINATION:  VITAL SIGNS:  Temperature 98.1, pulse 100, blood  pressure 124/78.  GENERAL:  Non-icteric white female.  The patient is obviously cushingoid.  EYES:  Sclerae  non-icteric, extraocular movements intact.  EARS/NOSE/THROAT:  Grossly normal.  LUNGS:  Clear.  HEART:  Regular rate and rhythm, without murmurs or gallops.  ABDOMEN:  Not distended and soft, with bowel sounds present.  Mild  tenderness in the epigastrium and more so in the left upper quadrant.   ASSESSMENT:  Epigastric and left upper quadrant pain with elevated amylase  and lipase, most likely due to pancreatitis.  Etiology is unclear, it could  be gallstones, but I think in this scenario, the more likely cause is Imuran-  induced pancreatitis.  Plan; will admit to the hospital and give IV Solu-  Medrol 20 mg daily; hold all of her other medications; control her pain with  Demerol and Phenergan and obtain a gallbladder ultrasound. Will obtain 6MP  metabolites, will definitely needed to hold her Imuran for now.                                               James L. Deterding, M.D.    JLD/MEDQ  D:  06/11/2002  T:  06/11/2002  Job:  [id number]   cc:   Osvaldo Human, M.D.  [place] [place]  [place] [address]  Fax: Rosalie Deterding, M.D.  [place]  [place] [address]  Fax: [phone]   Michael Litter, M.D.  [place]  [place] [address]  Fax: Wanamie Ronnald Ramp, M.D.  [address] [place]  [place] [address]  Fax: [phone]

## 2010-12-02 NOTE — Discharge Summary (Signed)
   NAME:  [name], [name]                      ACCOUNT NO.:  [id number]   MEDICAL RECORD NO.:  [id number]                   PATIENT TYPE:  INP   LOCATION:  5533                                 FACILITY:  [facility]   PHYSICIAN:  James L. Rolla Flatten., M.D.          DATE OF BIRTH:  Nov 29, 1965   DATE OF ADMISSION:  06/23/2002  DATE OF DISCHARGE:  06/26/2002                                 DISCHARGE SUMMARY   ADMISSION DIAGNOSIS:  Recurrent pancreatitis.   FINAL DIAGNOSIS:  Recurrent pancreatitis improved felt due to Imuran.   HISTORY:  The patient had previously been admitted with pancreatitis, which  started after she began Imuran. She does have ulcerative colitis and had  been on prednisone, Asacol, and Imuran, but the Imuran was the only  medication that was new. She had also been on Lipitor. All of these  medications have been shown to cause drug-induced pancreatitis. She was  hospitalized briefly in November, was improved, started eating and became  sick again with nausea and vomiting.   PHYSICAL EXAMINATION:  VITAL SIGNS:  She was afebrile. Vital signs were  normal.  ABDOMEN:  Soft with localized epigastric tenderness. For more details please  see the dictated admission history and physical.   HOSPITAL COURSE:  The patient was admitted to the medical floor, placed on  IV fluids and made NPO. Liver tests were normal. White count was 12, amylase  was 257 and lipase was 497. She has remained NPO. Felt better the next day.  CT scan was obtained, which showed mild pancreatitis particularly of the  tail. It was thought that she was having a relapse of her drug-induced  pancreatitis. She still had some epigastric pain, but was tolerating clear  liquids. By the 11th she was tolerating clear liquids without pain and we  felt that she could be discharged.    DISPOSITION:  The patient is discharged home on prednisone and Colazal with  a tapering dose of prednisone. We will hold the  Imuran, Asacol, and Lipitor.  She will be on a fat-free clear liquid diet and slowly add potatoes and  pasta. Will see Dr. Oletta Lamas back in the office in early January. If her  pancreatitis relapses again it may well be that she will need to be on home  T&A.                                               James L. Rolla Flatten., M.D.    [name]  D:  07/29/2002  T:  07/29/2002  Job:  [id number]   cc:   Jeneen Rinks L. Deterding, M.D.  [place]  [place] [address]  Fax: [phone]   Osvaldo Human, M.D.  [address]  [place] [address]  Fax: [phone]

## 2010-12-02 NOTE — Op Note (Signed)
[name], [name]               ACCOUNT NO.:  [id number]   MEDICAL RECORD NO.:  [id number]          PATIENT TYPE:  AMB   LOCATION:  ENDO                         FACILITY:  [facility]   PHYSICIAN:  James L. Rolla Flatten., M.D.DATE OF BIRTH:  1965/08/30   DATE OF PROCEDURE:  DATE OF DISCHARGE:                                 OPERATIVE REPORT   PROCEDURE PERFORMED:  Esophagogastroduodenoscopy.   MEDICATIONS:  Fentanyl 100 mcg, Versed 10 mg IV, Cetacaine spray.   ENDOSCOPIST:  Joyice Faster. Oletta Lamas, M.D.   INDICATIONS FOR PROCEDURE:  Abdominal pain, mild pancreatitis, negative CT,  ultrasound.  The patient has had previous Nissen, has a multitude of  extracolonic manifestations of her ulcerative colitis.  This is done to rule  out any type of ulceration, slippage of Nissen, etc.   DESCRIPTION OF PROCEDURE:  The procedure had been explained to the patient  and consent obtained.  With the patient in left lateral decubitus position,  the Olympus scope was inserted and advanced.  The stomach was entered.  The  pylorus identified and passed.  The duodenum including the bulb and second  portion were seen well and were unremarkable.  No ulceration or  inflammation.  The pyloric channel, antrum and body were normal.  Fundus and  cardia seen well in the retroflex view.  The Nissen was seen and appeared to  be intact.  The distal proximal esophagus was seen well and was normal.  The  scope was withdrawn.  The patient tolerated the procedure well.   ASSESSMENT:  Epigastric pain with essentially negative endoscopy.  No  evidence of perforating duodenal ulcer etc.   PLAN:  Will continue supportive treatment for pancreatitis.  She will follow-  up with labs in Dr. Deterding's office next week.  We will arrange an MRCP  to look for signs of sclerosing cholangitis.           ______________________________  Joyice Faster. Rolla Flatten., M.D.     [name]  D:  11/02/2005  T:  11/03/2005  Job:  [id number]   cc:    Jeneen Rinks L. Deterding, M.D.  Fax: [phone]

## 2010-12-02 NOTE — H&P (Signed)
NAME:  [name], [name]                      ACCOUNT NO.:  [id number]   MEDICAL RECORD NO.:  [id number]                   PATIENT TYPE:  INP   LOCATION:  5522                                 FACILITY:  [facility]   PHYSICIAN:  James L. Rolla Flatten., M.D.          DATE OF BIRTH:  October 30, 1965   DATE OF ADMISSION:  07/03/2002  DATE OF DISCHARGE:                                HISTORY & PHYSICAL   REASON FOR ADMISSION:  Recurrent episodic pancreatitis.   HISTORY:  This is a very nice 45 year old, white female who had ulcerative  colitis diagnosed in August of this year by colonoscopy with very severe  inflammation of the left colon with the right colon being endoscopically  normal but pathologically involved in ulcerative colitis.  There were no  signs of Crohn's ad appeared to clear of the ulcerative colitis.  She was  treated with an ASA preparation and Prednisone, and got much better.  As  soon as we tapered her down from the Prednisone on two occasions, she flared  right up back up and we had to bump her Prednisone back up again.  She had  become cushingoid and had become quite symptomatic from the Prednisone.  After her last flare and bump up, we elected to begin her on Imuran.  She  started on Imuran on November 20, this is the first time she had been on  Imuran.  Within approximately seven to eight days, she was admitted with  acute pancreatitis, ultrasound showed no gallstones and the feeling was that  this was most likely due to Imuran-induced pancreatitis.  She was sent back  home on clear liquids with plans to slowly advance her diet.  As soon as she  advanced back to complex carbohydrates, pancreatitis promptly returned and  she had to be readmitted on December 8.  At that time, in conjunction with  pharmacy, we studied all of her medications and in addition to Imuran which  is known to cause pancreatitis, Asacol, Lipitor and the steroids had all  been noted to cause drug-induced  pancreatitis.  She had been on all of these  above medicines for some time, and it was still felt that the Imuran was the  culprit since the pancreatitis began temporally related to the initiation of  Imuran therapy.  The patient was discharged back home again and has been  seen in the office, clinically felt better as long as she is on clear  liquids, but was losing weight and feeling fatigued.  She apparently, last  week, took some chicken bouillon and promptly became sick again.  We saw her  in the office and wanted to try her on some pancreatic enzyme supplements  and Resource, however, she has continued to have diarrhea and pain and had  labs checked today with amylase rise to 218 and lipase to 358, much  increased from previous values.  During her previous admission, a CT scan  of  the abdomen was obtained with the pancreas showing distal pancreatitis with  no obvious pseudo cyst and the head of the pancreas did not seem to be  actively inflamed.   CURRENT MEDICATIONS:  Colazal 750 mg, one tablet t.i.d.; Vicodin and Ultram  p.r.n. for pain; Prednisone 5 mg alternating with 7.5 mg daily;  levothyroxine 25 mcg daily.   ALLERGIES:  The patient is allergic to Imuran, doxycycline and mycins.   PAST MEDICAL HISTORY:  1. Ulcerative colitis diagnosed in August of 2003.  2. Hypothyroidism.  3. Chronic esophageal reflux, status post laparoscopic fundoplication.  4. Chronic joint pain and skin lesions which have been thought to be due to     inflammatory bowel disease.  5. Status post tubal ligation, hysterectomy and oophorectomy.   FAMILY HISTORY:  The patient has a sister who was diagnosed with Crohn's  disease in her 78s.  There is heart disease, hypertension, and breast cancer  in the family.   SOCIAL HISTORY:  She has two children who are healthy.  She is married.  She  does not smoke.  Does not drink.  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   REVIEW OF SYSTEMS:   Primarily as mentioned above, she has gained weight and  become increasingly cushingoid, tolerated the Prednisone poorly.   PHYSICAL EXAMINATION:  VITAL SIGNS:  No currently available.  GENERAL:  Obese, cushingoid, white female in no acute distress.  EYES:  Sclerae non-icteric.  Extraocular movements intact.  LUNGS:  Clear.  HEART:  Regular rate and rhythm, without murmurs or gallops.  ABDOMEN:  Not distended.  Bowel sounds are present and active.  There is  marked tenderness in the epigastrium to deep palpation.   ASSESSMENT:  1. Acute, relapsing pancreatitis, etiology of this is not entirely clear;     however, I suspect it is due to the Imuran.  This is suggested by the     temporal relationship to the Imuran and the fact that she has been on     steroids, Lipitor and Asacol for some time without pancreatitis although     each of these drugs has been reported to cause pancreatitis and may be     contributing.  I think it is best that we try to get her off of all of     them.  It is not clear why the pancreatitis is not resolved with the     Imuran being discontinued.  The patient does have a distal pancreatitis     rather than proximal pancreatitis by CT and it would certainly make one     wonder if she has some sort of stricture or congenital abnormality of the     pancreas that might need to be addressed.  2. Ulcerative colitis seems to be reasonably quiescent currently due to the     patient basically being on a liquid diet.  This will ultimately be a     problem we will have to deal with.  Currently, she seems to be having     more difficulty from the side effects of the medicine to treat the     ulcerative colitis than the colitis itself.   I have had a long time with she and her mother, we have talked about the  fact that ulcerative colitis can likely be cured with surgery.  We have talked about Remicade as option for therapy and some of the complications  that can go  along  with that.  I have told them that this is more of a long  term thing and right now we need to gt the pancreatitis under control.  At  this point in time, the T&A to place the pancreas at rest for extended  period of time I think will be the best choice, and therefore, will place a  PICC line.  I have discussed this with the patient and she is agreeable.   Will go ahead and obtain an MRCP.  I have talked with Dr. Ardeen Garland, they can  safely do this with a PICC line so we will go ahead and place PICC line  today and obtain an MRCP tomorrow.                                               James L. Rolla Flatten., M.D.    [name]  D:  07/03/2002  T:  07/04/2002  Job:  [id number]   cc:   Jeneen Rinks L. Deterding, M.D.  [place] [address]  Fax: [phone]   Osvaldo Human, M.D.  [address]  [place] [address]  Fax: [phone]

## 2010-12-02 NOTE — Op Note (Signed)
[name]               ACCOUNT NO.:  [id number]   MEDICAL RECORD NO.:  [id number]          PATIENT TYPE:  AMB   LOCATION:  ENDO                         FACILITY:  [facility]   PHYSICIAN:  James L. Rolla Flatten., M.D.DATE OF BIRTH:  [birth year]   DATE OF PROCEDURE:  04/13/2004  DATE OF DISCHARGE:                                 OPERATIVE REPORT   PROCEDURE:  Colonoscopy and biopsy.   MEDICATIONS:  Fentanyl 100 mcg, Versed 10 mg IV.   SCOPE:  Olympus pediatric colonoscope.   INDICATIONS:  An unfortunate 45 year old woman with documented ulcerative  colitis with multiple extra-intestinal manifestations complicated by some  sort of autoimmune disorder including autoimmune thyroiditis. She was in the  hospital recently with abdominal pain, weakness, tremendous aching.  Sigmoidoscopy showed no active colitis. CT scan of the abdomen and pelvis  was negative. Subsequent upper GI and small-bowel follow through was  negative for ulceration, Crohn's disease, or any signs of bowel obstruction.  The patient has continued to have vague right sided pain, has been on  steroids for her extra-intestinal manifestations of inflammatory bowel  disease versus rheumatologic symptoms, and has had a slightly elevated white  count and low grade fever in spite of this. Due to the fact that she is  continuing to have right sided abdominal pain and the fact that her right  colon has not been evaluated recently, and the fact that she has had  increased bloating, a colonoscopy is performed.   DESCRIPTION OF PROCEDURE:  Procedure had been explained to the patient and  consent obtained. Placed in the left lateral decubitus position, and Olympus  scope was inserted and advanced. Prep was adequate. She had a fairly quick  prep due to her bloated abdomen yesterday and had not been on extensive  clear liquids, so there was some solid pieces of stool but really 95% of the  mucosa was seen well. The mucosa  throughout the colon was completely  endoscopically normal and easily reached the cecum. I, in particle, examined  the appendiceal orifice. There was concern over right sided abdominal pain  and possible appendicitis. The appendiceal orifice was totally normal, and  no inflammation or any signs of acute appendicitis. The ileocecal valve was  identified and entered. I entered the terminal ileum for 15 cm. This was  endoscopically normal. Several random biopsies were taken for microscopic  signs of inflammatory bowel disease _______________. These were placed in  jar #1. The scope was withdrawn, and the mucosa of the ascending, transverse  colon, descending, and sigmoid colon was completely endoscopically normal as  in the rectum, with a good vascular pattern, no edema, no signs of active  ulcerative colitis or Crohn's. Multiple biopsies were taken upon removal  throughout the colon and placed in jar #2. Scope was withdrawn, and the  patient tolerated the procedure well.   ASSESSMENT:  1.  History of ulcerative colitis, currently in remission by all      radiographic and endoscopic criteria, 556.9.  2.  Right lower quadrant pain of unclear cause, 789.03.   PLAN:  Will check  pathology. Will start on MiraLax 1 glass daily. See back  in the office in 3 to 4 weeks. She will be going to [facility] for an  opinion from the GI department there regarding her inflammatory bowel  disease, and I will go ahead and decrease her prednisone 10 mg a day.      JLE/MEDQ  D:  04/13/2004  T:  04/13/2004  Job:  [id number]   cc:   Osvaldo Human, M.D.  [place] [place]  [place] [address]  Fax: Wiseman Deterding, M.D.  [address]  [place]  [place] [address]  Fax: [phone]   Michael Litter, M.D.  [place]  [place] [address]  Fax: [phone]   Jeanann Lewandowsky, M.D.  [address] [place] [address]  Fax: [place]. Cherylann Banas,  M.D.  [address], [place]  [place] [address]  Fax: [phone]   Fabio Asa, M.D.  [address]  [place] [address]  Fax: [phone]

## 2010-12-02 NOTE — Discharge Summary (Signed)
[name]. [facility]  Patient:    [name], [name]                      MRN: [id number] Adm. Date:  [id number] Disc. Date: [id number] Attending:  Fabio Asa A Dictator:   Julianne Handler, N.P. CC:         [name], M.D.   Discharge Summary  DATE OF BIRTH:  [birth year]  DISCHARGE DIAGNOSIS: 1. Chest pain:  [name] is a pleasant 45 year old female with a positive    family history of coronary artery disease, long history of tobacco use and    recently diagnosed hypertension. She presented after noting early on the    morning of admission, left anterior chest discomfort with radiation to her    left shoulder. She was evaluated at her primary care doctors office with    total relief of discomfort after one sublingual nitrate. She was triaged to    the emergency room. She had a resting Cardiolite image that afternoon with    a stress Cardiolite on the first hospital day, which was significant for    recurrence of the left anterior and epigastric chest pressure in the    recovery phase. Her stress was significant for marked shortness of breath    and fatigue after a total of 3 minutes and 15 seconds into stage 1 of the    Bruce protocol reflecting poor physical deconditioning, consistent with    tobacco use and obesity. The Cardiolite was significant for a fixed    anterior wall defect, although the left ventricular function was normal.    Because of the Cardiolite effects and ongoing chest discomfort the patient    was counseled to undergo, and accepted, plans for coronary angiography with    the following results:    A. Left ventricular gram:  Ejection fraction 65% with normal wall motion.       No mitral regurgitation.    B. Coronary angiography:       a. Left vein:  Bifurcates. No significant disease.       b. Left anterior descending:  Small diagonal 1, small diagonal 2,          moderate size diagonal 3. Left anterior descending tapers  quickly. No          significant disease.       c. Circumflex:  Small obtuse marginal, large obtuse marginal #2 ends          as AVG vessel.       d. Right circumflex coronary artery:  Dominant.  At present the patient had no significant CAD to account for her symptoms. However, in view of the mid and distal LAD tapering quickly it was the opinion of Dr. Jaci Standard that this may account for the chest discomfort, if there had been evidence of significant spasm. With that in mind she will be continued on her Toprol 50 mg (in view of relative tachycardia upon admission) with the initiation of Cardizem CD 120 mg to the medical regimen. 2. Hypothyroidism by TSH:  TSH decreased at 0.284 with evidence of sinus    tachycardia on admission with a resting heart rate of 111. The patient will    continue on her beta blocker and will leave follow up of further    hyperthyroidism to the primary doctor. 3. Admission hyperglycemia:  Admission glucose elevated at 138. Follow up    fasting glucose  okay at 59. Hemoglobin A1C was okay at 5.1. 4. Ongoing tobacco use. The patient was strongly counseled for smoking    cessation. Methods of quitting, importance of quitting as relative to her    health status were discussed in detail. She seems committed to total    smoking cessation, at this time. 5. Hypokalemia:  Admission serum potassium 3.2; supplemented with 40 mEq with    repeat potassium of 3.4. She was given an additional 40 mEq. Followup BMET    will be done by her primary doctor. 6. History of hypertension:  The patient is on Toprol by her primary doctor.    She did have a hypertensive response to exercise with peak blood pressure    of 198/90. 7. Deconditioning:  Initiation of walking/exercise regimen was discussed.  CONDITION ON DISCHARGE:  The patient was discharged home in stable condition.  DISCHARGE MEDICATIONS: 1. Toprol 50 mg one p.o. q.d. 2. (New) Cardizem CD 120 mg p.o. q.d.  DISCHARGE  DIET:  Diet; low fat, low cholesterol recommended. Low salt diet.  WOUND CARE:  SPECIAL INSTRUCTIONS: The patient is to call our clinic if she develops a large amount of swelling or bruising in the groin area.  FOLLOWUP:  The patient will make an appointment to see Dr. Osvaldo Human when next available. It is anticipated that he will followup on her low TSH, monitoring her blood pressure with addition of Cardizem to her medical regimen, and following up on a BMET to ensure the serum potassium remains in good range.  LABORATORY DATA:  The first CK 80 with an MB fraction 0.4 seconds, CK 65 with MB fraction of 0.3. Current CK 59 with an MB fraction of 0.5. Troponin I was less than 0.01 x 3. TSH was decreased at 0.284 with reference range of 0.350 to 5.1. Hemoglobin A1C within normal range at 5.1. Hemoglobin 15, hematocrit 43.5, WBC 10.6, platelets 430. Serum sodium 141, potassium 3.2 on admission; supplemented with subsequent potassium of 3.4. Chloride 108, CO2 24, BUN 11, creatinine 0.9, glucose 87. LFTs within normal range.  EKG revealed normal sinus rhythm without ischemic changes.  Chest x-ray revealed no active disease. DD:  05/31/00 TD:  05/31/00 Job: [id number]

## 2010-12-02 NOTE — Procedures (Signed)
[place]. [facility]  Patient:    [name], [name] Visit Number: [id number] MRN: [id number]          Service Type: END Location: ENDO Attending Physician:  Sherrin Daisy Dictated by:   Joyice Faster. Oletta Lamas, M.D. Proc. Date: 06/10/01 Admit Date:  06/10/2001 Discharge Date: 06/10/2001   CC:         Darrelyn Hillock, M.D.  [name], M.D.   Procedure Report  PROCEDURE:  Esophageal manometry.  INDICATION:  This is being done preoperatively in consideration of an antireflux procedure.  DESCRIPTION OF PROCEDURE:  The procedure was performed in the usual manner. No provocative studies were performed, and there was no 24-hour pH probe performed.  Results were as follows:  1. Upper esophageal sphincter relaxed completely with swallowing.  Pharyngeal    spikes were present. 2. Esophageal body:  Normal peristalsis in all wet swallows seen.  Mean    pressure 98, mean duration 3.9 seconds. 3. Lower esophageal sphincter mean pressure 35 mm, within the normal range.    Appears to relax with swallowing.  ASSESSMENT:  Essentially normal manometry.  PLAN:  The patient will see Dr. Truitt Leep for consideration for antireflux surgery. Dictated by:   Joyice Faster. Oletta Lamas, M.D. Attending Physician:  Sherrin Daisy DD:  06/22/01 TD:  06/22/01 Job: [id number]

## 2010-12-02 NOTE — Discharge Summary (Signed)
NAME:  [name], [name]                      ACCOUNT NO.:  [id number]   MEDICAL RECORD NO.:  [id number]                   PATIENT TYPE:  INP   LOCATION:  5522                                 FACILITY:  [facility]   PHYSICIAN:  James L. Rolla Flatten., M.D.          DATE OF BIRTH:  [birth year]   DATE OF ADMISSION:  07/03/2002  DATE OF DISCHARGE:  07/08/2002                                 DISCHARGE SUMMARY   ADMISSION DIAGNOSES:  1. Acute relapsing pancreatitis secondary to Imuran.  2. Ulcerative colitis with multiple extracolonic manifestations.   DISCHARGE DIAGNOSES:  1. Acute relapsing pancreatitis secondary to Imuran.  2. Ulcerative colitis with multiple extracolonic manifestations.   PERTINENT HISTORY:  A 45 year old with ulcerative colitis diagnosed in 2003.  Had been on steroids and was started on Imuran after she failed to improve  with steroid taper.  Within seven to eight days of starting the  Imuran, she  developed acute pancreatitis.  Workup including CT and ultrasound failed to  show any other obvious cause.  She was hospitalized for this and got better  and went home on clear liquids with slow increase of her diet.  She took  some bouillon and became sick and had abdominal  pain and vomiting.  Amylase  and lipase bumped back up.   MEDICATIONS ON ADMISSION:  1. Colazal.  2. Vicodin.  3. Ultram.  4. Prednisone 5 mg alternating with 7.5.  5. Levothyroxine 25 mcg daily.   PHYSICAL EXAMINATION:  Remarkable for normal heart and lungs.  Abdomen was  nondistended.  Bowel sounds were active.  There  was marked epigastric  tenderness.  For more details, please see dictated admission history and  physical.   HOSPITAL COURSE:  The patient was admitted to medical floor and kept n.p.o.  Started on IV fluids.  We went ahead and made arrangements to place a PIC  line which was placed in radiology.  She was seen by pharmacy and  arrangements were made to start TNA.  She was followed  by pharmacy and the  formula was adjusted.  TNA began to be cycled at night with the patient  allowed to have sips of clear liquids with no bouillon or any pat  whatsoever.  Arrangements were made with home health care service of the  case manager to have the TNA delivered to the house.  It would be cycled at  night.  The patient was marked improved.   DISPOSITION:  The patient will be discharged home on TNA arranged by  [place].  She will be on clear liquids, fat free.   MEDICATIONS:  1. Prednisone daily 5 mg.  On 12/29, she will decrease to 2.5 mg on odd days     and 5 mg on even days.  2. She will take Vicodin for pain.  3.     Phenergan for nausea.  4. Protonix 40 mg daily.  See will see Dr. Oletta Lamas in the office on 1/6.  Condition is improved.                                               James L. Rolla Flatten., M.D.    [name]  D:  08/12/2002  T:  08/12/2002  Job:  [id number]   cc:   Jeneen Rinks L. Deterding, M.D.  [place]  [place] [address]  Fax: [phone]

## 2010-12-02 NOTE — Procedures (Signed)
[place]  Patient:    [name], [name] Visit Number: [id number] MRN: [id number]          Service Type: END Location: ENDO Attending Physician:  Sherrin Daisy Proc. Date: 04/24/01 Admit Date:  04/24/2001   CC:         [name], M.D.   Procedure Report  PROCEDURE PERFORMED:  Esophagogastroduodenoscopy.  ENDOSCOPIST:  Joyice Faster. Edwards, M.D.  MEDICATIONS:  Fentanyl 50 mcg and Versed 7 mg IV.  INDICATION:  Persistent epigastric pain and worsening reflux symptoms despite the use of aggressive therapy.  DESCRIPTION OF PROCEDURE:  The patient has been explained to the patient and consent obtained.  With the patient in the left lateral decubitus position, the Olympus video endoscope was inserted blindly in the esophagus under direct visualization.  The stomach was entered and the scope passed.  The duodenum including the bulb and second portion were seen well and was unremarkable. The scope was withdrawn back in the stomach and the pyloric channel, antrum, and body were normal.  The fundus and cardia were seen well under direct visualization and were normal.  There was a 2-3 cm hiatal hernia with a widely patent GE junction with free reflux with the stomach bulging up into the chest.  The distal esophagus was red and there were several areas that appeared to be healing ulcerations.  They were linear right at the GE junction.  There was no active bleeding.  The scope was withdrawn and the proximal esophagus was normal.  ASSESSMENT:  Small esophageal ulcers, almost certainly secondary to gastroesophageal reflux disease with free reflux seen at the time of the procedure.  PLAN: 1. Will continue Aciphex b.i.d. for reflux. 2. Will check the gallbladder ultrasound and see her back in the    office in 6-8 weeks. Attending Physician:  Sherrin Daisy DD:  04/24/01 TD:  04/24/01 Job: [id number]

## 2010-12-02 NOTE — Op Note (Signed)
NAME:  [name], [name]                         ACCOUNT NO.:  [id number]   MEDICAL RECORD NO.:  [id number]                   PATIENT TYPE:  AMB   LOCATION:  DAY                                  FACILITY:  [facility]   PHYSICIAN:  Daniel L. Cherylann Banas, M.D.           DATE OF BIRTH:  [birth year]   DATE OF PROCEDURE:  07/16/2003  DATE OF DISCHARGE:                                 OPERATIVE REPORT   PREOPERATIVE DIAGNOSES:  Left lower quadrant pain, left adnexal masses,  symptomatic rectocele.   POSTOPERATIVE DIAGNOSES:  Left lower quadrant pain, multiple left ovarian  cysts, symptomatic rectocele.   OPERATION:  Diagnostic laparoscopy with multiple left ovarian cystectomies.   SURGEON:  Daniel L. Cherylann Banas, M.D.   FIRST ASSISTANT:  Timothy P. Fontaine, M.D.   ANESTHESIA:  General endotracheal.   FINDINGS:  At the time of laparoscopy there was no evidence of any  endometriosis in her pelvis, her left adnexa was completely free without any  adhesive disease.  There were three to four enlargements of the ovary, one  appeared to be consistent with a fibroma, one appeared to be more consistent  with a hemorrhagic cyst, and two appeared to be lesions that could easily  have been nonpigmented endometriosis, total areas involved were two  2-cm  lesions and two 1.5-cm lesions.  At the time of posterior repair, the  patient had a well-supported vault, a well-supported anterior wall, but had  a second degree rectocele.   DESCRIPTION OF PROCEDURE:  After adequate general endotracheal anesthesia,  the patient was placed in the dorsal lithotomy position and prepped and  draped in the usual sterile manner.  A Foley catheter was inserted into the  patient's bladder.  A pneumoperitoneum was created subumbilically with the  Veress needle and 3.5 liters of carbon dioxide was utilized and then a 10-mm  trocar was placed.  The operating laparoscope was placed and it was obvious  that the peritoneal cavity  had been entered without trauma.  Two other  probes were placed in the right and left lower quadrants, these were 5-mm  ports and through these a variety of instrumentation was placed.  The whole  pelvis could be adequately evaluated, there was no endometriosis, there was  no adhesive disease but she had these multiple ovarian cysts that possibly  could have been the source of her pain.  As a result of this, they were all  removed and the laser was utilized to remove them, it was used with a G6 tip  over a bare fiber, 12 watts of power were utilized, all cysts were removed  intact without rupturing.  There was a little bit of oozing with the cyst at  the ovarian capsule and this was controlled with bipolar coagulation.  All  the cysts were removed, labelled separately and sent to pathology for tissue  diagnosis.  Copious irrigation was done with Ringer's lactate, all cul-de-  sac fluid was removed, pneumoperitoneum was evacuated and the subumbilical  fascial incision was closed with 0 Vicryl and the other incisions were  closed with Steri-Strips.   The patient was repositioned, the surgeons regloved, a posterior repair was  done.  Next, the vaginal mucosa was undermined to the top of the cuff, the  perirectal fascia and the rectocele was dissected free.  At one point the  surgeon put his hand in the rectum to be sure that the rectum was not  injured and the rectocele was completely dissected free.  Once this had been  done, the rectocele was reduced with nine interrupted sutures of 2-0 Vicryl.  Redundant vaginal mucosa was trimmed away, perirectal fascia as well as  mucosa was brought together in the midline with a running, locking 0 Vicryl,  reinforcing the above sutures and also incorporating the fascia that had  already been  reapproximated when the rectocele was reduced.  The vagina was then packed  with 1-inch Iodoform pack.  Estimated blood loss was 150 cc with none  replaced.  The  patient tolerated the procedure well and left the operating  room in satisfactory condition, draining clear urine from the Foley  catheter.                                               Daniel L. Cherylann Banas, M.D.    Lind Guest  D:  07/16/2003  T:  07/16/2003  Job:  [id number]

## 2010-12-02 NOTE — Cardiovascular Report (Signed)
[place]  Patient:    [name], [name]                      MRN: [id number] Adm. Date:  [id number] Attending:  Fabio Asa A CC:         [name], M.D.                        Cardiac Catheterization  PROCEDURE:  Left heart catheterization, coronary angiography, single plane ventriculogram.  IMPRESSION:  Chest pain with typical symptoms and fixed defect noted on the anterior wall.  DESCRIPTION OF PROCEDURE:  After obtaining written informed consent, the patient was brought to the cardiac catheterization in the postabsorptive state.  Preoperative sedation was achieved using IV Versed.  The right femoral head was identified using radiographic technique.  Selective coronary angiography was performed using a JL4 and JR4 Judkins catheter.  All catheter exchanges were made over a guide wire.  The hemostasis sheath was flushed following each injection.  Multiple views were obtained.  Single plane ventriculogram was performed in an RAO position using a 6 French pigtail curved catheter.  Following the procedure, there was no identifiable lesion amenable to angioplasty.  The patient was transferred to the holding area. The hemostasis sheath was removed.  Hemostasis was achieved using digital pressure.  FINDINGS:  Aortic pressure was 133/87, LV pressure 134/19.  Single plane ventriculogram revealed normal wall motion and ejection fraction of 65%. No mitral regurgitation was noted.  CORONARY ANGIOGRAPHY:  The left main coronary artery bifurcated into the left anterior descending and circumflex vessel.  There was no significant disease in the left man coronary artery.  Left anterior descending.  The left anterior descending gave rise to a small D1, small D2, and moderate size D3, and ended as a very small LAD.  The left anterior descending had a rapid tapering at the midportion.  Circumflex.  The circumflex vessel gave rise to a small OM1,  large OM2, and ended as an AV groove vessel.  There was no significant disease in the circumflex or its branches.  Right coronary artery.  The right coronary artery was a large dominant artery and gave rise to a large PDA and large PL branch.  IMPRESSION:  No significant lesions identified, however, the mid to distal LAD tapered quickly to less than 1 mm in size.  It is possible that if the patient had any spasm within this part of the vessel it could account for chest pain and definite could attribute to the fixed defect noted on the Cardiolite.  RECOMMENDATIONS:  Continue with Toprol 50 mg over relative tachycardia noted throughout this hospitalization.  In addition the patient should be started on Cardizem CD 120 mg p.o. q.d. for possible spasm.  A repeat potassium should be obtained in view of the patients relative hypokalemia.  The patient has been strongly urged to discontinue all tobacco products. DD:  05/31/00 TD:  05/31/00 Job: [id number]

## 2010-12-02 NOTE — Op Note (Signed)
[name], [name]                         ACCOUNT NO.:  [id number]   MEDICAL RECORD NO.:  [id number]                   PATIENT TYPE:  INP   LOCATION:  1828                                 FACILITY:  [facility]   PHYSICIAN:  Ronald Lobo, M.D.                DATE OF BIRTH:  1965/08/18   DATE OF PROCEDURE:  04/01/2004  DATE OF DISCHARGE:                                 OPERATIVE REPORT   PROCEDURE PERFORMED:  Flexible sigmoidoscopy with biopsies.   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   FINDINGS:  Normal exam to the splenic flexure.   DESCRIPTION OF PROCEDURE:  The nature, purpose and risks of the procedure  have been reviewed with the patient and she provided written consent.  She  was brought from the emergency room up to the endoscopy unit and given light  sedation with Versed 10 mg IV to a level of mild sedation.  No fentanyl was  used (history of hives to Demerol and I just wanted to do a limited exam, so  I did not feel that I wanted to take the risk of a cross reaction with  another opiate).  The Olympus adjustable tension pediatric video colonoscope  was advanced to the splenic flexure without much difficulty and pullback was  then performed.  At the maximal insertion of the scope, it appeared that I  was looking down the tunnel of the transverse colon with typical triangular  haustrations.  There was a moderate amount of formed stool in the more  proximal regions of the exam and scant formed stool more distally.  No  liquid stool was seen.  This exam, incidentally, was done unprepped.   This was a remarkably normal examination.  There was a good vascular mucosal  pattern throughout the  exam, including in the rectum.  There was smooth  glistening mucosa.  I did not appreciate any erythema, friability,  granularity, exudate, loss of vascularity, pseudopolyps, diverticular  disease, polyps, cancer or vascular ectasia.  Retroflexion in the rectum was  normal.  There was actually a tiny diminutive sessile hyperplastic-appearing  polyp which I excised with the biopsy forceps.  No larger polyps were seen,  however.  Random mucosal biopsies were taken in the sigmoid and rectum.   IMPRESSION:  Essentially normal examination in a patient with a several year  history of ulcerative colitis, and current symptoms of diarrhea and cramps  (5560).  The observed findings do not suggest active colitis.  Nor do they  explain the patient's abdominal pain or diarrhea.  There was no evidence of  pseudomembranes, incidentally, which is pertinent since the patient had been  exposed to Augmentin about a month ago.   PLAN:  Await pathology results.  Supportive care while in the hospital  including empiric steroids because of the severe arthralgias and history of  autoimmune disorder.      RB/MEDQ  D:  04/01/2004  T:  04/04/2004  Job:  [id number]   cc:   Jeneen Rinks L. Rolla Flatten., M.D.  [address], [place]  [place]  [place] [address]  Fax: [place] Deterding, M.D.  [address]  [place]  [place] [address]  Fax: [phone]   Jeanann Lewandowsky, M.D.  [address], [place] [address]  Fax: [place]. Cherylann Banas, M.D.  [address] [place]  [place] [address]  Fax: [phone]   Fabio Asa, M.D.  [address]  [place]  [place] [address]  Fax: [phone]   Osvaldo Human, M.D.  [place]. [place]  [place] [address]  Fax: [phone]   Michael Litter, M.D.  [address]  [place]  [place] [address]  Fax: [phone]

## 2010-12-02 NOTE — Discharge Summary (Signed)
   NAME:  [name], [name]                      ACCOUNT NO.:  [id number]   MEDICAL RECORD NO.:  [id number]                   PATIENT TYPE:  INP   LOCATION:  3036                                 FACILITY:  [facility]   PHYSICIAN:  James L. Rolla Flatten., M.D.          DATE OF BIRTH:  02-Sep-1965   DATE OF ADMISSION:  06/11/2002  DATE OF DISCHARGE:  06/12/2002                                 DISCHARGE SUMMARY   FINAL DIAGNOSIS:  Pancreatitis, felt due to Imuran.   PERTINENT HISTORY:  The patient, a 45 year old with ulcerative colitis was  started on prednisone in the past.  As soon as she tapered off it, her  colitis flared.  Recently we saw her, and Imuran was started around the 1st  of November, and she developed abdominal pain suddenly.  Amylase and lipase  were elevated.  Ultrasound in the past had shown no gallstones.  The patient  had epigastric pain, radiating to the back and on exam was remarkable for  mild tenderness in the left upper quadrant.   HOSPITAL COURSE:  The patient was admitted to a medical floor with the  diagnosis of pancreatitis.  A  gallbladder ultrasound was obtained which  showed the pancreatic tail was obscured; the body and head were within  normal limits.  There were no gallstones in the gallbladder, and common bile  duct was normal.  The day following her admission, her amylase and lipase  had improved, amylase down to 65.  She was tolerating clear liquids.  It was  felt this was drug-induced pancreatitis, probably due to the initiation of  Imuran.   DISPOSITION:  The patient was discharged home to continue all of her home  medications, withholding Imuran.  Will see Jeneen Rinks L. Oletta Lamas, M.D. in the  office next week.  She will call if she has any further problems.                                               James L. Rolla Flatten., M.D.    [name]  D:  07/03/2002  T:  07/05/2002  Job:  [id number]   cc:   Osvaldo Human, M.D.  [place]. [place]  [place] [address]  Fax: [place] Deterding, M.D.  [place]  [place] [address]  Fax: [phone]

## 2010-12-02 NOTE — H&P (Signed)
NAME:  [name], [name]                         ACCOUNT NO.:  [id number]   MEDICAL RECORD NO.:  [id number]                   PATIENT TYPE:  INP   LOCATION:  1828                                 FACILITY:  [facility]   PHYSICIAN:  Ronald Lobo, M.D.                DATE OF BIRTH:  28-May-1966   DATE OF ADMISSION:  04/01/2004  DATE OF DISCHARGE:                                HISTORY & PHYSICAL   This 45 year old female with ulcerative colitis is being admitted to the  hospital because of increased symptoms of abdominal pain, diarrhea, and  arthralgias.   The patient is well-known to my partner, Dr. Laurence Spates, for ulcerative  colitis which was diagnosed several years ago.  Apparently it is pancolitis.  The main clinical manifestation, however, has been extraintestinal  manifestations, primarily severe arthralgias.  The patient was previously  treated with Imuran, but this led to pancreatitis of moderate severity  approximately a year and a half ago, even to the point of requiring  hospitalization and subsequent home TNA.  More recently, therefore, the  patient has been managed with cyclosporin under the care of Dr. Jeneen Rinks  Deterding due to his familiarity with the dosing of that medication.  Unfortunately, the patient developed renal insufficiency and a couple of  weeks ago her cyclosporin dose had to be decreased from 200 mg to 50 mg  daily, and she went off it entirely two days ago.   The current chief complaint is low abdominal cramps with associated  diarrhea, preceded by hip and other joint pains, which began several days  ago, roughly the time the cyclosporin was stopped.  She had fever to 101  degrees and was started empirically on Cipro 500 mg b.i.d. by Dr. Jimmy Footman.  She has not had blood in her stool.  She has had nausea but is unable to  vomit due to prior antireflux surgery.  Note that the patient was on  Augmentin 1000 mg b.i.d. for an upper respiratory infection about a  month  ago, through Dr. Jamal Maes.   Because of the accelerated symptoms and the inability to use cyclosporin,  the patient was directed to the emergency room and inpatient care was felt  to be needed.   PAST MEDICAL HISTORY:  Allergy to multiple medications, including IMURAN  (severe pancreatitis), DOXYCYCLINE AND ERYTHROMYCIN (rash), and DEMEROL  (hives).   Operations include a previous hysterectomy with right oophorectomy, a left  ovarian cystectomy with rectocele repair, and a Nissen fundoplication (Dr.  Truitt Leep) approximately 2000.  She has also had a left ankle  reconstruction and left knee arthroscopy.   Medical illnesses include nonspecific autoimmune disease followed by Dr.  Justine Null, a history of autoimmune thyroiditis, status post radioactive iodine  therapy through Dr. 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  Oletta Lamas showed erosive esophagitis despite an intact Nissen fundoplication),  and a history of hyperlipidemia.  Of note, the patient also has a history of  a positive PPD, treated with INH about seven years ago.   OUTPATIENT MEDICATIONS:  Certain medications are currently being held.  These include cyclosporin, Lasix, Toprol, and Sular.  The previous doses  were:   1.  Lasix 40 mg every other day.  2.  Cyclosporin 200 mg daily and then 50 mg daily.  3.  Toprol XL 50 mg daily.  4.  Sular 10 mg q.h.s.   Prednisone 40 mg was started about 10 days ago, and the dose was dropped to  20 mg three days ago.  The patient also has been on amitriptyline at h.s.  and more recently on Cipro 500 mg b.i.d. for the past two days for an  elevated white count and fever.   HABITS:  The patient smokes up to a pack a day and is a nondrinker.   FAMILY HISTORY:  Crohn's disease in her sister.  Negative for colon cancer,  ulcers,  or gallbladder trouble.   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   REVIEW OF SYSTEMS:  Various arthralgias especially in the hip, nausea as  stated above, a sense of her heart racing recently since the Toprol XL was  stopped, skin rash on her hands (suggestive to me of dyshidrotic eczema but  previously biopsied apparently without specific diagnosis).  The skin rash  gets better on steroids.  No dysuria.  I believe no problem with chest pain  or trouble breathing.   PHYSICAL EXAMINATION:  VITAL SIGNS:  Vital signs in the emergency room  included blood pressure 136/78, heart rate approximately 110, temperature  98.9, respirations 18 and unlabored.  GENERAL:  The patient is a well-developed, well-nourished Caucasian female  curled up in the bed on her side, not quite in a fetal position, in mild  acute distress.  HEENT:  Anicteric.  No overt facial pallor.  Oropharynx moist and benign  without obvious ulcerations or glossitis.  NECK:  No thyromegaly appreciated.  CHEST:  Clear to auscultation anteriorly.  CARDIAC:  Slightly rapid rate.  No gallops, rubs, murmurs, or clicks  appreciated.  ABDOMEN:  Diminished bowel sounds, somewhat adipose.  Diffusely subjectively  somewhat tender but nonrigid, no peritoneal findings, no mass effect, no  exquisite tenderness.   LABORATORY DATA:  These have been ordered but are pending at this time.  X-  rays:  These have been ordered but are pending at this time.  EKG:  Sinus  tachycardia with a heart rate of 134 and nonspecific ST-T wave changes.   IMPRESSION:  Probable exacerbation of ulcerative colitis characterized by  cramps and diarrhea, although Clostridium difficile could conceivably  present in this fashion and the patient has recently been on antibiotics.   PROBLEM LIST:  1.  Abdominal cramps and diarrhea.  2.  Several-year history of ulcerative pancolitis with predominantly     extraintestinal manifestations  (arthralgias).  3.  History of pancreatitis secondary to Imuran.  4.  Recent antibiotic exposure (risk of Clostridium difficile colitis).  5.  History of gastroesophageal reflux disease, with recent erosive      esophagus by endoscopy even despite Nissen fundoplication surgery      several years ago.  6.  Status post hysterectomy, right oophorectomy, and left ovarian      cystectomy with rectocele repair.  7.  History of positive pure protein derivative, status post INH therapy.      This might relatively contraindicate Remicade therapy.  8.  Renal insufficiency secondary to cyclosporin (recent creatinine 1.5).  9.  History of autoimmune thyroiditis, status post radioactive iodine.  10. History of nonspecific autoimmune disorder, followed by Dr. Justine Null.  11. History of tachycardia, hypertension, and hyperlipidemia.   PLAN:  The patient will be admitted for bowel rest, symptom control, and  diagnostic evaluation to include sigmoidoscopic evaluation to help rule out  C. difficile.  For now, her antihypertensive and cardiac medications will be  held per Dr. Jimmy Footman.  We will use intravenous steroids to try to control  presumptive ulcerative colitis inflammatory changes.  Further management  will depend on the patient's clinical evolution.                                                Ronald Lobo, M.D.    RB/MEDQ  D:  04/01/2004  T:  04/02/2004  Job:  [id number]   cc:   Jeneen Rinks L. Rolla Flatten., M.D.  [address] [place]  [place]  [place] [address]  Fax: [place] Deterding, M.D.  [place]  [place] [address]  Fax: [phone]   Fabio Asa, M.D.  [address]  [place]  [place] [address]  Fax: [phone]   Jeanann Lewandowsky, M.D.  [address], [place] [address]  Fax: [phone]   Osvaldo Human, M.D.  [address] [place]  [place] [address]  Fax: Jal Cherylann Banas, M.D.  [address]  [place]  [place] [address]  Fax: [phone]   Michael Litter, M.D.  [address]  [place]  [place] [address]  Fax: [phone]

## 2010-12-02 NOTE — H&P (Signed)
[place]  Patient:    [name], HELDT                      MRN: [id number] Proc. Date: 05/30/00 Adm. Date:  [id number] Attending:  Molpus, John L Dictator:   Julianne Handler, N.P. CC:         [name], M.D.   History and Physical  DATE OF BIRTH:  Apr 10, 1966  SUBJECTIVE:  Ms. Garima Chronis is a pleasant, obese 45 year old female with recently diagnosed hypertension.  No prior history of coronary artery disease known.  The patient noted at approximately 6:30 a.m. onset of left anterior chest discomfort with radiation into her left shoulder.  The pain crescendoed so that at its most severe it was rated a 5/10 and lasted about 45 minutes. It gradually eased off until, when she was evaluated at Dr. Colvin Caroli office, it was relieved totally after one sublingual nitroglycerin.  Her EKG at primary doctors office was nonischemic.  She had had no associated shortness of breath, nausea, no diaphoresis.  She had noted slight exacerbation of discomfort with inspiration.  Currently she is pain free and is in no distress.  CARDIAC RISK FACTORS:  Positive family history for CAD, long history of tobacco use, recently diagnosed hypertension.  PAST MEDICAL HISTORY: 1. Hypertension diagnosed one month earlier for which she is on Toprol. 2. Small goiter by exam but a prior TSH was okay per patient report. 3. History of cystitis; history of bladder tuck pack. 4. History of left ankle reconstruction secondary to weak ligaments. 5. History of of tubal ligation. 6. History of hysterectomy. 7. History of BSO secondary to endometriosis.  The patient denies history of diabetes mellitus, cancer, nor asthma.  Does have episodic bronchitis.  ALLERGIES:  DOXYCYCLINE, ERYTHROMYCIN causing rash.  MEDICATIONS:  Toprol XL 50 mg p.o. q.a.m.  Took two baby aspirin this morning but usually does not.  PERSONAL HISTORY AND HABITS:  Tobacco use:  3/4  pack-per-day for 20 years. ETOH:  Negative.  CAFFEINE:  Four caffeinated beverages a day.  The patient has been married for 15 years and has two daughters aged 35 and 60, alive and well.  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  FAMILY HISTORY:  Her dad is age 20 and had a heart attack in his late 27s.  He has had a cardiac valve repair and is on systemic anticoagulation.  He also has a history of hypertension and has a pacemaker in place.  Her mother is age 38 and by EKG has had prior myocardial infarction.  She has problems with tachyarrhythmias.  The patient has two brothers and one sister who are alive and well.  REVIEW OF SYSTEMS:  As in HPI and past medical history.  Otherwise has episodic complaint of dizziness but not frequent.  She denies problems with her hearing or vision.  Negative dysphagia to food or fluid.  Episodic headaches which may be a tension type and can last as long as two weeks.  She also has a tendency towards constipation.  Negative melena or bright red blood per rectum.  History of cystitis but has been "cured" with interventions by Dr. Gaynelle Arabian as in previous medical history.  Negative current dysuria.  No arthritic type complaints.  Denies pedal edema, orthopnea, nor PND.  Has not had any recent long trips and denies problems with shortness of breath nor DOE.  PHYSICAL  EXAMINATION:  VITAL SIGNS:  Blood pressure is 113/69, temperature 99.2, pulse 114, respiratory rate 14, room air O2 saturations 100%.  GENERAL:  She is an overweight young female in no acute distress.  HEENT:  Paraspinal carotid upstrokes, no bruit.  NECK:  No JVD nor thyromegaly.  CARDIAC:  Regular rate and rhythm without murmur, rub or gallop.  Normal S1, S2.  CHEST:  Lung sounds clear with equal ______ excursion  ABDOMEN:  Soft, obese, normal bowel sounds.  Negative abdomen aortic, no femoral bruit.  Nontender to applied pressure.  No  masses nor organomegaly appreciated though difficult exam secondary to obesity.  EXTREMITIES:  Distal pulses intact; Negative pedal edema.  NEUROLOGIC:  Cranial 2/12 grossly intact; alert and oriented x 3.  GENITORECTAL:  Deferred.  X-RAYS/TESTS/DATA:  CK 80, CK MB fraction pending.  Troponin I pending. Sodium 137, potassium 3.2, chloride 105. CO2 25, glucose 138, BUN 10, creatinine 0.8, calcium 8.9.  LFTs within normal range.  WBC slightly elevated at 10.6 with hemoglobin of 15, hematocrit 43.5, platelets 430.  EKG reveals sinus tachycardia at 11 beats per minute without ischemic changes.  Chest x-ray revealed no active disease.  IMPRESSION: 1. Chest discomfort in this 45 year old female with long history of tobacco    use, positive family history for coronary artery disease and recently    diagnosed hypertension.  Currently pain free after sublingual nitroglycerin    in primary M.D.s office.  EKG nonischemic and a first CK is okay though MB    fraction and troponin I are pending. 2. Elevated blood glucose on random check; 138. 3. Hypokalemia with serum potassium 3.2. 4. Resting sinus tachycardia of uncertain etiology.  This despite her being on    chronic beta-blocker therapy. 5. History of palpitations. 6. Ongoing tobacco use.  PLAN: 1. Will admit to 23 hour observation to rule out MI.  Protocol for serial    cardiac enzymes daily, EKG.  Will obtain two day stress Cardiolite with    resting images today and stress images tomorrow morning. 2. Supplement potassium. 3. Will increase beta-blocker to 100 mg at time of discharge.  Will give    additional 50 mg a day, as she already had a 50 mg dose this morning.  Will    initiate proton pump inhibitor for possible gastritis. 4. Initiated discussion of benefits of smoking cessation with increase    predisposing risk to CAD and lung cancer.  Ongoing smoking cessation    counseling will continue during the course of this  admission. 5. Will obtain a hemoglobin A1C as well as TSH.  DD:  05/30/00 TD:  05/30/00 Job: [id number]

## 2010-12-02 NOTE — Op Note (Signed)
[facility]  Patient:    [name], [name] Visit Number: [id number] MRN: [id number]          Service Type: SUR Location: [address] [place] 01 Attending Physician:  Darrelyn Hillock Dictated by:   Darrelyn Hillock, M.D. Proc. Date: 07/24/01 Admit Date:  07/24/2001   CC:         Jeneen Rinks L. Oletta Lamas, M.D.   Operative Report  PREOPERATIVE DIAGNOSIS:  Gastroesophageal reflux disease and hiatal hernia.  POSTOPERATIVE DIAGNOSIS:  Gastroesophageal reflux disease and hiatal hernia.  PROCEDURE:  Laparoscopic Nissen fundoplication.  SURGEON:  Darrelyn Hillock, M.D.  ASSISTANT:  Marland Kitchen T. Hoxworth, M.D.  ANESTHESIA:  General.  DESCRIPTION OF PROCEDURE:  The patient was taken to the operating room, placed in the supine position. After adequate general anesthesia was induced using endotracheal tube, the abdomen was prepped and draped in the normal sterile fashion. A small supraumbilical vertical incision was made. I dissected down to the fascia, which was opened vertically. The peritoneum was entered. Hasson trocar was placed in the abdomen and I had difficulty insufflating but finally obtained pneumoperitoneum to a pressure of 15 mmHg. However, on delivering the camera through the Hasson, it appeared that it was through the falciform ligament. Under direct visualization, a 10 mm port was placed in the left upper quadrant. The camera was then placed through that port and indeed the Hasson trocar was through a very broad, thick and long falciform ligament. There was no evidence of bleeding. Under direct visualization, two additional 10 mm ports were placed in the right upper quadrant. Again, this was difficulty because of the large size of the falciform. This was accomplished under direct visualization. A small 5 mm port was placed in the left upper quadrant. A left lateral segment in the liver was retracted anteriorly. I began dissection in the right  paraesophageal space until the right crus was identified. There was significant inflammation around the right crus of the diaphragm. It _______ pretty low on the esophagus as well. It was dissected off the esophagus and the peritoneum was incised anterior to the esophagus. Left crus was identified. The esophagus was then retracted anteriorly and a window was created posterior to the esophagus. The hiatal hernia defect was not exceptionally large.  I then took down a number of short gastrics on the greater curvature of the stomach proximally toward the spleen. This mobilized an adequate amount of fundus which was then passed behind the esophagus and laid without any tension. The hiatal defect was closed with two 0 Tycron sutures and the fundoplication was accomplished with three 0 Tycron sutures approximating the fundus, esophagus, and stomach. The wrap measured about 3 cm and it was accomplished over 48 bougie. I was very satisfied with wrap, the bougie was removed, and adequate hemostasis was insured. The trocars were all removed. The abdomen was allowed to deflate. The fascial defect was closed with the 0 Vicryl pursestring suture. The skin incisions were closed with staples. All incisions were injected using 0.5% Marcaine. The patient tolerated the procedure well and went to PACU in good condition. Dictated by:   Darrelyn Hillock, M.D. Attending Physician:  Janeece Agee R. DD:  07/24/01 TD:  07/24/01 Job: [id number]

## 2011-01-10 ENCOUNTER — Other Ambulatory Visit: Payer: Self-pay | Admitting: Family Medicine

## 2011-01-10 DIAGNOSIS — Z1231 Encounter for screening mammogram for malignant neoplasm of breast: Secondary | ICD-10-CM

## 2011-01-25 ENCOUNTER — Encounter: Payer: Self-pay | Admitting: *Deleted

## 2011-01-25 ENCOUNTER — Telehealth: Payer: Self-pay | Admitting: Internal Medicine

## 2011-01-25 NOTE — Telephone Encounter (Signed)
Patient calling to report she is having an ulcerative colitis flare. Stools with blood and mucous x 1 week. Sometimes the stool is diarrhea. Urgency with stools.  Denies pain or fever. Scheduled patient to see Dr. Olevia Perches on 01/26/11 at 2:30 PM.

## 2011-01-26 ENCOUNTER — Ambulatory Visit (INDEPENDENT_AMBULATORY_CARE_PROVIDER_SITE_OTHER): Payer: 59 | Admitting: Internal Medicine

## 2011-01-26 ENCOUNTER — Other Ambulatory Visit (INDEPENDENT_AMBULATORY_CARE_PROVIDER_SITE_OTHER): Payer: 59

## 2011-01-26 ENCOUNTER — Encounter: Payer: Self-pay | Admitting: Internal Medicine

## 2011-01-26 DIAGNOSIS — K5190 Ulcerative colitis, unspecified, without complications: Secondary | ICD-10-CM

## 2011-01-26 DIAGNOSIS — K625 Hemorrhage of anus and rectum: Secondary | ICD-10-CM

## 2011-01-26 DIAGNOSIS — K5100 Ulcerative (chronic) pancolitis without complications: Secondary | ICD-10-CM

## 2011-01-26 LAB — CBC WITH DIFFERENTIAL/PLATELET
Basophils Absolute: 0 10*3/uL (ref 0.0–0.1)
Basophils Relative: 0.5 % (ref 0.0–3.0)
Eosinophils Absolute: 0.3 10*3/uL (ref 0.0–0.7)
Eosinophils Relative: 4 % (ref 0.0–5.0)
HCT: 40 % (ref 36.0–46.0)
Hemoglobin: 13.8 g/dL (ref 12.0–15.0)
Lymphocytes Relative: 30.2 % (ref 12.0–46.0)
Lymphs Abs: 2.2 10*3/uL (ref 0.7–4.0)
MCHC: 34.5 g/dL (ref 30.0–36.0)
MCV: 96.9 fl (ref 78.0–100.0)
Monocytes Absolute: 0.7 10*3/uL (ref 0.1–1.0)
Monocytes Relative: 9.8 % (ref 3.0–12.0)
Neutro Abs: 4 10*3/uL (ref 1.4–7.7)
Neutrophils Relative %: 55.5 % (ref 43.0–77.0)
Platelets: 309 10*3/uL (ref 150.0–400.0)
RBC: 4.13 Mil/uL (ref 3.87–5.11)
RDW: 13.6 % (ref 11.5–14.6)
WBC: 7.2 10*3/uL (ref 4.5–10.5)

## 2011-01-26 LAB — SEDIMENTATION RATE: Sed Rate: 31 mm/hr — ABNORMAL HIGH (ref 0–22)

## 2011-01-26 MED ORDER — MESALAMINE 400 MG PO TBEC: DELAYED_RELEASE_TABLET | ORAL | Status: DC

## 2011-01-26 MED ORDER — DICYCLOMINE HCL 10 MG PO CAPS: 10.0000 mg | ORAL_CAPSULE | Freq: Three times a day (TID) | ORAL | Status: DC

## 2011-01-26 MED ORDER — HYDROCORTISONE ACETATE 25 MG RE SUPP: 25.0000 mg | Freq: Every day | RECTAL | Status: AC

## 2011-01-26 NOTE — Progress Notes (Signed)
[name] [birth year] MRN [id number]    History of Present Illness:  [name] with left-sided ulcerative colitis diagnosed in 2002. Her most recent colonoscopy in February 2011 showed pancolitis. Her last office visit was in August 2011. She was in remission at the time prednisone was tapered off in June 2011. She discontinued all her medications in the Fall. She is having a recurrence of bleeding, abdominal pain, diarrhea and mucus in her stool which started for 3-4 weeks. She in the past took cyclosporin, Humira and Enbrel. She was also on methotrexate at one time. An upper endoscopy in February 2011 showed a functioning Nissan fundoplication which was dilated with a 52 [place] Maloney dilator. A HIDA scan in December 2010 showed a 91% ejection fraction. There is a history of pancreatitis in 2003 possibly related to Imuran. Her sister has Crohn's disease.    Past Medical History  Diagnosis Date  . Irritable bowel syndrome   . Arthritis   . Tachycardia   . Fibrocystic breast disease   . GERD (gastroesophageal reflux disease)   . Pancreatitis   . Ovarian cyst   . Hypothyroidism   . Hyperlipidemia   . Hypertension   . Psoriasis   . Ulcerative colitis    Past Surgical History  Procedure Date  . Tubal ligation   . Repair cystocele and rectocele   . Nissen fundoplication   . Thyroid ablation   . Ankle reconstruction     left  . Bladder tuck     reports that she quit smoking about 5 years ago. She has never used smokeless tobacco. She reports that she drinks alcohol. She reports that she does not use illicit drugs. family history includes Breast cancer in her mother; Colon polyps in her mother; Crohn's disease in her sister; Heart disease in her father and mother; and Irritable bowel syndrome in an unspecified family member.  There is no history of Colon cancer. Allergies  Allergen Reactions  . Azathioprine     REACTION: pancreatitis  . Doxycycline     REACTION: rash  . Erythromycin   . Latex   . Meperidine Hcl   . Moxifloxacin   . Remicade (Infliximab)     Hives and redness         Review of Systems: Denies dysphagia or odynophagia. Complains of not being able to belch  The remainder of the 10  point ROS is negative except as outlined in H&P   Physical Exam: General appearance  Well developed, in no distress, appears depressed. Eyes- non icteric. HEENT nontraumatic, normocephalic. Mouth no lesions, tongue papillated, no cheilosis. Neck supple without adenopathy, thyroid not enlarged, no carotid bruits, no JVD. Lungs Clear to auscultation bilaterally. Cor normal S1 normal S2, regular rhythm , no murmur,  quiet precordium. Abdomen soft but diffusely tender, mostly in the left lower quadrant. Normoactive bowel sounds. No distention. Rectal: Normal perianal area. Normal rectal sphincter tone. No stool in the rectal ampulla mucus is heme-negative. Extremities no pedal edema. Skin no lesions. Neurological alert and oriented x 3. Psychological normal mood and affect.  Assessment and Plan:  Problem #1 Exacerbation of ulcerative colitis. It was initially left-sided but on the last colonoscopy, random biopsies showed involvement of the entire colon. She discontinued her medications thinking that her disease went away. We will put her back on Asacol 4.8 g daily, Bentyl 10 mg by mouth 3 times a day and hydrocortisone suppositories. If symptoms don't improve in the next 2  weeks, we will have to restart steroids although she is very reluctant to do that. We will check her CBC and sed rate today as well.   01/26/2011 [name]

## 2011-01-26 NOTE — Patient Instructions (Signed)
Your physician has requested that you go to the basement for the following lab work before leaving today: CBC, Sedimentation Rate We have sent the following medications to your pharmacy for you to pick up at your convenience: Asacol 400 mg. Take 3 tablets 3 times daily Bentyl 10 mg. Take 1 tablet by mouth three times daily. Hydrocortisone at night. Please follow up in 6 weeks. CC: Dr Mayra Neer

## 2011-01-27 ENCOUNTER — Telehealth: Payer: Self-pay | Admitting: *Deleted

## 2011-01-27 NOTE — Telephone Encounter (Signed)
Message copied by Hulan Saas on Fri Jan 27, 2011  9:46 AM ------      Message from: [name]      Created: Thu Jan 26, 2011  5:49 PM       Please call pt with normal CBC but elevated sed.rate indicating colitis flare up. Continue meds as discussed.

## 2011-01-27 NOTE — Telephone Encounter (Signed)
Patient given results and recommendations by Dr. Olevia Perches.

## 2011-02-03 ENCOUNTER — Ambulatory Visit: Payer: Self-pay

## 2011-02-06 ENCOUNTER — Ambulatory Visit
Admission: RE | Admit: 2011-02-06 | Discharge: 2011-02-06 | Disposition: A | Discharge status: Home / Self Care | Payer: 59 | Source: Ambulatory Visit | Attending: Family Medicine | Admitting: Family Medicine

## 2011-02-06 DIAGNOSIS — Z1231 Encounter for screening mammogram for malignant neoplasm of breast: Secondary | ICD-10-CM

## 2011-02-27 ENCOUNTER — Encounter: Payer: Self-pay | Admitting: Internal Medicine

## 2011-02-27 ENCOUNTER — Ambulatory Visit (INDEPENDENT_AMBULATORY_CARE_PROVIDER_SITE_OTHER): Payer: 59 | Admitting: Internal Medicine

## 2011-02-27 VITALS — BP 124/62 | HR 80 | Ht 66.0 in | Wt 184.0 lb

## 2011-02-27 DIAGNOSIS — K625 Hemorrhage of anus and rectum: Secondary | ICD-10-CM

## 2011-02-27 DIAGNOSIS — K5100 Ulcerative (chronic) pancolitis without complications: Secondary | ICD-10-CM

## 2011-02-27 MED ORDER — PREDNISONE 10 MG PO TABS: ORAL_TABLET | ORAL | Status: DC

## 2011-02-27 MED ORDER — MESALAMINE 800 MG PO TBEC: DELAYED_RELEASE_TABLET | ORAL | Status: DC

## 2011-02-27 NOTE — Patient Instructions (Addendum)
We have sent the following medications to your pharmacy for you to pick up at your convenience: Prednisone 10 mg. Take as follows: 4 tablets (40 mg) by mouth once daily x 2 weeks 3 tablets (30 mg) by mouth once daily x 2 weeks 2.5 tablets (25 mg) by mouth once daily x 2 weeks 2 tablets (20 mg) by mouth once daily x 2 weeks 1.5 tablet (15 mg) by mouth once daily x 2 weeks 1 tablet (10 mg) by mouth once daily x 2 weeks 0.5 tablet (5 mg) by mouth once daily x 2 weeks Then, Discontinue. Asacol HD 800 mg. Takes 3 tablet by mouth twice daily. Dr Derrill Memo

## 2011-02-27 NOTE — Progress Notes (Signed)
[name] [birth year] MRN [id number]     History of Present Illness:  [name] with ulcerative colitis since 2002. Her last colonoscopy in February 2011 showed pancolitis. Her last office visit was on 01/27/2011. Her sedimentation rate was elevated at 31. Her hemoglobin 13.8. She was in remission in August 2011 until a flareup in January of 2012. She has bleeding, mucousy stools, diarrhea and cramping abdominal pain. She has tried various medications including cyclosporin, Humira, Remicade, Enbrel and methotrexate. She had Imuran-induced pancreatitis in 2003. An upper endoscopy in February 2011 showed a functioning Nissen fundoplication. Her HIDA scan in December 2010 showed a 91% ejection fraction. Her sister has Crohn's disease. She has been on Asacol 3.6 g a day with only 20-30% improvement. She is still having 10-15 bloody bowel movements a day.   Past Medical History  Diagnosis Date  . Irritable bowel syndrome   . Arthritis   . Tachycardia   . Fibrocystic breast disease   . GERD (gastroesophageal reflux disease)   . Pancreatitis   . Ovarian cyst   . Hypothyroidism   . Hyperlipidemia   . Hypertension   . Psoriasis   . Ulcerative colitis    Past Surgical History  Procedure Date  . Tubal ligation   . Repair cystocele and rectocele   . Nissen fundoplication   . Thyroid ablation   . Ankle reconstruction     left  . Bladder tuck     reports that she quit smoking about 5 years ago. She has never used smokeless tobacco. She reports that she drinks alcohol. She reports that she does not use illicit drugs. family history includes Breast cancer in her mother; Colon polyps in her mother; Crohn's disease in her sister; Heart disease in her father and mother; and Irritable bowel syndrome in an unspecified family member.  There is no history of Colon cancer. Allergies  Allergen Reactions  . Azathioprine     REACTION: pancreatitis  . Doxycycline    REACTION: rash  . Erythromycin   . Latex   . Meperidine Hcl   . Moxifloxacin   . Remicade (Infliximab)     Hives and redness         Review of Systems: Denies dysphagia, odynophagia. Shortness of breath or chest pain  The remainder of the 10  point ROS is negative except as outlined in H&P   Physical Exam: General appearance  Well developed in no distress. Eyes- non icteric. HEENT nontraumatic, normocephalic. Mouth no lesions, tongue papillated, no cheilosis. Neck supple without adenopathy, thyroid not enlarged, no carotid bruits, no JVD. Lungs Clear to auscultation bilaterally. Cor normal S1 normal S2, regular rhythm , no murmur,  quiet precordium. Abdomen soft diffusely tender abdomen with normal active bowel sounds. No distention. Liver edge at costal margin. Rectal: Not done. Extremities no pedal edema. Skin no lesions. Neurological alert and oriented x 3. Psychological normal mood and affect.  Assessment and Plan:  Problem #1 Flareup of ulcerative colitis and pancolitis by biopsies during colonoscopy. She has had the disease now for 10 years. She is intolerant to a lot of medications. She was not initially willing to start on prednisone but she is now agreeable to start a prednisone taper. She will take 40 mg for 2 weeks then down by 10 mg in 2 weeks then down by 5 mg every 2 weeks. I will see her in 8 weeks. She will also increase her Asacol to 4.8 g daily. We  have discussed possibly adding methotrexate or Entocort. We have also discussed her having  increased risks for colon cancer after having the disease for 10 years. She will be due for a recall colonoscopy in 2014.   02/27/2011 [name]

## 2011-05-03 LAB — CBC
HCT: 35.7 — ABNORMAL LOW
HCT: 37.4
Hemoglobin: 12.1
Hemoglobin: 12.6
MCHC: 33.7
MCHC: 34
MCV: 98.6
MCV: 98.6
Platelets: 322
Platelets: 346
RBC: 3.62 — ABNORMAL LOW
RBC: 3.8 — ABNORMAL LOW
RDW: 13
RDW: 13.2
WBC: 8
WBC: 8.4

## 2011-05-03 LAB — BASIC METABOLIC PANEL
BUN: 7
CO2: 26
Calcium: 8.6
Chloride: 109
Creatinine, Ser: 0.77
GFR calc Af Amer: >60
GFR calc non Af Amer: >60
Glucose, Bld: 99
Potassium: 3.7
Sodium: 139

## 2011-05-03 LAB — DIFFERENTIAL
Basophils Absolute: 0
Basophils Relative: 1
Eosinophils Absolute: 0.1
Eosinophils Relative: 1
Lymphocytes Relative: 33
Lymphs Abs: 2.8
Monocytes Absolute: 0.8 — ABNORMAL HIGH
Monocytes Relative: 9
Neutro Abs: 4.7
Neutrophils Relative %: 56

## 2011-05-03 LAB — COMPREHENSIVE METABOLIC PANEL
ALT: 14
AST: 17
Albumin: 3.3 — ABNORMAL LOW
Alkaline Phosphatase: 71
BUN: 11
CO2: 27
Calcium: 8.6
Chloride: 104
Creatinine, Ser: 0.81
GFR calc Af Amer: >60
GFR calc non Af Amer: >60
Glucose, Bld: 115 — ABNORMAL HIGH
Potassium: 3.9
Sodium: 137
Total Bilirubin: 1
Total Protein: 6.3

## 2011-05-03 LAB — CYCLOSPORINE LEVEL: Cyclosporine: 49 — ABNORMAL LOW

## 2011-05-03 LAB — TSH: TSH: 1.811

## 2011-05-08 ENCOUNTER — Ambulatory Visit (INDEPENDENT_AMBULATORY_CARE_PROVIDER_SITE_OTHER): Payer: 59 | Admitting: Internal Medicine

## 2011-05-08 ENCOUNTER — Encounter: Payer: Self-pay | Admitting: Internal Medicine

## 2011-05-08 VITALS — BP 124/72 | HR 80 | Ht 66.0 in | Wt 187.0 lb

## 2011-05-08 DIAGNOSIS — K219 Gastro-esophageal reflux disease without esophagitis: Secondary | ICD-10-CM

## 2011-05-08 DIAGNOSIS — L409 Psoriasis, unspecified: Secondary | ICD-10-CM

## 2011-05-08 DIAGNOSIS — K5100 Ulcerative (chronic) pancolitis without complications: Secondary | ICD-10-CM

## 2011-05-08 DIAGNOSIS — L408 Other psoriasis: Secondary | ICD-10-CM

## 2011-05-08 NOTE — Progress Notes (Signed)
[name] [birth year] MRN [id number]   History of Present Illness:  [name] with ulcerative colitis since 2002. Her last office visit was 02/27/2011. Her last colonoscopy in February 2011 showed pancolitis. Recently, her sedimentation rate was elevated at 31. She had a flareup of colitis starting in January 2012, having diarrhea and bloody bowel movements. She responded to a prednisone taper which has been completed for 1 week and she is down to 2 bowel movements a day. She is symptomatically improved. Her medications include Asacol 4.8 g daily and a biological given by Dr. Estanislado Pandy on a monthly basis for psoriasis. She has a history of gastroesophageal reflux and Nissen fundoplication. Her last upper endoscopy in February 2011 showed a functioning Nissen fundoplication. Her HIDA scan in December 2010 showed a 91% ejection fraction. There is a family history of Crohn's disease in her sister. Patient has a history of Imuran-induced pancreatitis in 2003. She has in the past tried Humira, Remicade, Enbrel, and methotrexate.   Past Medical History  Diagnosis Date  . Irritable bowel syndrome   . Arthritis   . Tachycardia   . Fibrocystic breast disease   . GERD (gastroesophageal reflux disease)   . Pancreatitis   . Ovarian cyst   . Hypothyroidism   . Hyperlipidemia   . Hypertension   . Psoriasis   . Ulcerative colitis    Past Surgical History  Procedure Date  . Tubal ligation   . Repair cystocele and rectocele   . Nissen fundoplication   . Thyroid ablation   . Ankle reconstruction     left  . Bladder tuck     reports that she quit smoking about 5 years ago. She has never used smokeless tobacco. She reports that she drinks alcohol. She reports that she does not use illicit drugs. family history includes Breast cancer in her mother; Colon polyps in her mother; Crohn's disease in her sister; Heart disease in her father and mother; and Irritable bowel syndrome  in an unspecified family member.  There is no history of Colon cancer. Allergies  Allergen Reactions  . Azathioprine     REACTION: pancreatitis  . Doxycycline     REACTION: rash  . Erythromycin   . Latex   . Meperidine Hcl   . Moxifloxacin   . Remicade (Infliximab)     Hives and redness         Review of Systems: Occasional dysphagia. Denies chest pain shortness of breath. Decreased level of energy.  The remainder of the 10 point ROS is negative except as outlined in H&P   Physical Exam: General appearance: Well developed, in no distress. She appears cushingoid Skin: psoriatic lesions on her hands. Psychological: alert and oriented.  Assessment and Plan:  Problem #1 ulcerative colitis. Patient has difficult to manage ulcerative colitis with intolerance to many medications which have been tried in the past. She is currently doing reasonably well on Asacol. She just finished her prednisone taper. If her disease flares again we may consider adding methotrexate. She has lab work done by Dr Estanislado Pandy. If she continues to do well, we will see her in 6 months. I support her application for disability because of difficulty managing her disease and side effects from medications.  Problem #2 gastroesophageal reflux. Patient is status post Nissan fundoplication in the past which is now functioning well. She does still have occasional dysphagia.  Problem #3 psoriasis. This is managed by Dr. Estanislado Pandy.   05/08/2011 [name]

## 2011-05-08 NOTE — Patient Instructions (Addendum)
Have blood tests from Dr Estanislado Pandy' office mailed to me. OV 6 months if all well. Recall colonoscopy 08/2012. CC: Dr Estanislado Pandy, Patient

## 2011-07-31 ENCOUNTER — Telehealth: Payer: Self-pay | Admitting: Internal Medicine

## 2011-07-31 NOTE — Telephone Encounter (Signed)
Please schedule anOV

## 2011-07-31 NOTE — Telephone Encounter (Signed)
Spoke with patient and she states she has been having problems since before Christmas. She just wanted to get through the holidays. She is having left side abdominal pain that is always sore to touch but intensity comes and goes. Bowels are different. She states sometimes she has diarrhea, sometimes mucous/blood, sometimes stool with purple color blood and sometimes stool with bright, red blood. Stools are pencil shaped. Stomach gets "full and hard" and she has gas that sounds like a fog horn. She is taking Asacol HD 3 tabs TID. Please, advise.

## 2011-08-01 NOTE — Telephone Encounter (Signed)
Scheduled patient on 08/04/11 at 8:45 AM with Dr. Olevia Perches.

## 2011-08-04 ENCOUNTER — Other Ambulatory Visit (INDEPENDENT_AMBULATORY_CARE_PROVIDER_SITE_OTHER): Payer: 59

## 2011-08-04 ENCOUNTER — Ambulatory Visit (INDEPENDENT_AMBULATORY_CARE_PROVIDER_SITE_OTHER): Payer: 59 | Admitting: Internal Medicine

## 2011-08-04 ENCOUNTER — Encounter: Payer: Self-pay | Admitting: Internal Medicine

## 2011-08-04 DIAGNOSIS — K5190 Ulcerative colitis, unspecified, without complications: Secondary | ICD-10-CM

## 2011-08-04 DIAGNOSIS — R195 Other fecal abnormalities: Secondary | ICD-10-CM

## 2011-08-04 DIAGNOSIS — R1013 Epigastric pain: Secondary | ICD-10-CM

## 2011-08-04 LAB — COMPREHENSIVE METABOLIC PANEL
ALT: 18 U/L (ref 0–35)
AST: 20 U/L (ref 0–37)
Albumin: 3.9 g/dL (ref 3.5–5.2)
Alkaline Phosphatase: 77 U/L (ref 39–117)
BUN: 22 mg/dL (ref 6–23)
CO2: 26 mEq/L (ref 19–32)
Calcium: 9 mg/dL (ref 8.4–10.5)
Chloride: 104 mEq/L (ref 96–112)
Creatinine, Ser: 1.3 mg/dL — ABNORMAL HIGH (ref 0.4–1.2)
GFR: 48.17 mL/min — ABNORMAL LOW (ref >60.00)
Glucose, Bld: 103 mg/dL — ABNORMAL HIGH (ref 70–99)
Potassium: 4.4 mEq/L (ref 3.5–5.1)
Sodium: 142 mEq/L (ref 135–145)
Total Bilirubin: 0.5 mg/dL (ref 0.3–1.2)
Total Protein: 7.2 g/dL (ref 6.0–8.3)

## 2011-08-04 LAB — CBC WITH DIFFERENTIAL/PLATELET
Basophils Absolute: 0 10*3/uL (ref 0.0–0.1)
Basophils Relative: 0.6 % (ref 0.0–3.0)
Eosinophils Absolute: 0.3 10*3/uL (ref 0.0–0.7)
Eosinophils Relative: 4.7 % (ref 0.0–5.0)
HCT: 37.7 % (ref 36.0–46.0)
Hemoglobin: 12.8 g/dL (ref 12.0–15.0)
Lymphocytes Relative: 35 % (ref 12.0–46.0)
Lymphs Abs: 2.5 10*3/uL (ref 0.7–4.0)
MCHC: 34.1 g/dL (ref 30.0–36.0)
MCV: 96.7 fl (ref 78.0–100.0)
Monocytes Absolute: 0.6 10*3/uL (ref 0.1–1.0)
Monocytes Relative: 8.5 % (ref 3.0–12.0)
Neutro Abs: 3.6 10*3/uL (ref 1.4–7.7)
Neutrophils Relative %: 51.2 % (ref 43.0–77.0)
Platelets: 329 10*3/uL (ref 150.0–400.0)
RBC: 3.9 Mil/uL (ref 3.87–5.11)
RDW: 13.1 % (ref 11.5–14.6)
WBC: 7 10*3/uL (ref 4.5–10.5)

## 2011-08-04 LAB — SEDIMENTATION RATE: Sed Rate: 29 mm/hr — ABNORMAL HIGH (ref 0–22)

## 2011-08-04 MED ORDER — MESALAMINE 1.2 G PO TBEC: DELAYED_RELEASE_TABLET | ORAL | Status: DC

## 2011-08-04 MED ORDER — HYDROCORTISONE 100 MG/60ML RE ENEM: 100.0000 mg | ENEMA | RECTAL | Status: AC

## 2011-08-04 MED ORDER — ESOMEPRAZOLE MAGNESIUM 40 MG PO CPDR: 40.0000 mg | DELAYED_RELEASE_CAPSULE | Freq: Every day | ORAL | Status: DC

## 2011-08-04 MED ORDER — METHOTREXATE 2.5 MG PO TABS: ORAL_TABLET | ORAL | Status: DC

## 2011-08-04 MED ORDER — ONDANSETRON HCL 4 MG PO TABS: 4.0000 mg | ORAL_TABLET | Freq: Three times a day (TID) | ORAL | Status: DC | PRN

## 2011-08-04 NOTE — Patient Instructions (Addendum)
Your physician has requested that you go to the basement for the following lab work before leaving today: CBC, CMET, Sedimentation Rate. We have sent the following medications to your pharmacy for you to pick up at your convenience: Cort Enema (every other day) Nexium (1 capsule daily... Samples) Zofran (every 8 hours as needed for nausea) Methotrexate (4 tablets once weekly) Lialda (4 tablets once daily) Please schedule a follow up appointment with Dr Olevia Perches in 8-12 weeks. CC:Dr Mayra Neer, Dr Estanislado Pandy

## 2011-08-04 NOTE — Progress Notes (Signed)
[name] [birth year] MRN [id number]        History of Present Illness:  This is a 46 year old white female with severe ulcerative colitis refractory to medical therapy. Last  appointment was in  October 2012. She was doing well after a prednisone taper. Unfortunately her colitis has started to flare up again and she is passing blood and complaints  stool with incontinence and upper abdominal pain. Also for allergies and intolerance to various medications we have had limited choice of treatment. She developed pancreatitis in 2003 following treatment with Imuran. She also failed Remicade and Humira. Her last colonoscopy in February 2011 showed pancolitis by random biopsies. She has been on mesalamine 3.6 g a day but has had financial problems in obtaining the medication because she is unable to work and has been denied for disability twice. She is awaiting for a court date. She borrows  Asacol from her sister who has Crohn's disease. She is complaining of nausea and bloating. There is a history of Nissen fundoplication for chronic gastroesophageal reflux. Upper endoscopy in February 2011 confirmed presence of functioning fundoplication. She is having perimenopausal symptoms consisting of hot flashes. She is also followed by Dr. Estanislado Pandy for psoriasis and has been on Simponi 50 mg IM monthly.   Past Medical History  Diagnosis Date  . Irritable bowel syndrome   . Arthritis   . Tachycardia   . Fibrocystic breast disease   . GERD (gastroesophageal reflux disease)   . Pancreatitis   . Ovarian cyst   . Hypothyroidism   . Hyperlipidemia   . Hypertension   . Psoriasis   . Ulcerative colitis    Past Surgical History  Procedure Date  . Tubal ligation   . Repair cystocele and rectocele   . Nissen fundoplication   . Thyroid ablation   . Ankle reconstruction     left  . Bladder tuck     reports that she quit smoking about 6 years ago. She has never used smokeless tobacco. She reports  that she drinks alcohol. She reports that she does not use illicit drugs. family history includes Breast cancer in her mother; Colon polyps in her mother; Crohn's disease in her sister; Heart disease in her father and mother; and Irritable bowel syndrome in an unspecified family member.  There is no history of Colon cancer. Allergies  Allergen Reactions  . Azathioprine     REACTION: pancreatitis  . Doxycycline     REACTION: rash  . Erythromycin   . Latex   . Meperidine Hcl   . Moxifloxacin   . Remicade (Infliximab)     Hives and redness   . Sulfa Drugs Cross Reactors     hives        Review of Systems: Denies dysphagia. Positive for bloating diarrhea hematochezia  The remainder of the 10 point ROS is negative except as outlined in H&P   Physical Exam: General appearance  Well developed, in no distress. Somewhat depressed Eyes- non icteric. HEENT nontraumatic, normocephalic. Mouth no lesions, tongue papillated, no cheilosis. Neck supple without adenopathy, thyroid not enlarged, no carotid bruits, no JVD. Lungs Clear to auscultation bilaterally. Cor normal S1, normal S2, regular rhythm, no murmur,  quiet precordium. Abdomen: Soft protuberant abdomen very tender in epigastrium left upper quadrant and along the left colon. Bowel sounds are normoactive. There is no fluid wave Rectal: Maroon colored Hemoccult positive stool Extremities no pedal edema. Skin no lesions. Neurological alert and oriented x 3. Psychological normal mood and  affect.  Assessment and Plan:  Chronic ulcerative colitis of 10 year duration difficult to control medically. Starting to flare up again after short period of remission. We are  limited in medications she can tolerate. We will increase her mesalamine to 4.8 g a day m, I gave her samples. She will also start methotrexate 2 and half milligrams 4 tablets weekly. We will start cort enemas every other night and she will try to retain it, We will also send  her Zofran 4 mg to take when necessary nausea. I gave her samples of Nexium to take for dyspepsia and what I think may be gastritis as reflected by epigastric tenderness. The will obtain lab tests today including amylase and lipase   08/04/2011 [name]

## 2011-08-07 ENCOUNTER — Telehealth: Payer: Self-pay | Admitting: *Deleted

## 2011-08-07 NOTE — Telephone Encounter (Signed)
Spoke with patient and gave her results as per Dr. Olevia Perches.

## 2011-08-07 NOTE — Telephone Encounter (Signed)
Message copied by Hulan Saas on Mon Aug 07, 2011  4:40 PM ------      Message from: [place], [place] M      Created: Fri Aug 04, 2011  4:50 PM       Please call pt with normal results except for the sed. Rate which is 29 due to her colitis. Liver tests are normal.

## 2011-10-03 ENCOUNTER — Ambulatory Visit (INDEPENDENT_AMBULATORY_CARE_PROVIDER_SITE_OTHER): Payer: 59 | Admitting: Internal Medicine

## 2011-10-03 ENCOUNTER — Encounter: Payer: Self-pay | Admitting: Internal Medicine

## 2011-10-03 VITALS — BP 126/78 | HR 93 | Ht 66.0 in | Wt 189.0 lb

## 2011-10-03 DIAGNOSIS — Z79899 Other long term (current) drug therapy: Secondary | ICD-10-CM

## 2011-10-03 DIAGNOSIS — K5100 Ulcerative (chronic) pancolitis without complications: Secondary | ICD-10-CM

## 2011-10-03 DIAGNOSIS — D849 Immunodeficiency, unspecified: Secondary | ICD-10-CM

## 2011-10-03 MED ORDER — HYDROCORTISONE 100 MG/60ML RE ENEM: 100.0000 mg | ENEMA | RECTAL | Status: AC

## 2011-10-03 MED ORDER — ONDANSETRON HCL 4 MG PO TABS: 4.0000 mg | ORAL_TABLET | Freq: Three times a day (TID) | ORAL | Status: AC | PRN

## 2011-10-03 NOTE — Progress Notes (Signed)
[name] Sep 28, 1965 MRN [id number]    History of Present Illness:  This is a 46 year old white female with severe ulcerative colitis of greater than 10 years duration, refractory to medical treatment. She responded to a prednisone taper in the fall of 2012 but flared up again in January 2013 passing bloody stool, having tenesmus and abdominal pain. We started her on methotrexate 10 mg per week and cort enemas with excellent results. She has already been on Simponi 50 mg IM monthly for psoriasis from Dr [name]. She is on Asacol HD 800 mg 6 tablets a day. The stools have solid consistency. She has not seen any blood. She is averaging 2-5 bowel movements per 24 hours.   Past Medical History  Diagnosis Date  . Irritable bowel syndrome   . Arthritis   . Tachycardia   . Fibrocystic breast disease   . GERD (gastroesophageal reflux disease)   . Pancreatitis   . Ovarian cyst   . Hypothyroidism   . Hyperlipidemia   . Hypertension   . Psoriasis   . Ulcerative colitis    Past Surgical History  Procedure Date  . Tubal ligation   . Repair cystocele and rectocele   . Nissen fundoplication   . Thyroid ablation   . Ankle reconstruction     left  . Bladder tuck     reports that she quit smoking about 6 years ago. She has never used smokeless tobacco. She reports that she drinks alcohol. She reports that she does not use illicit drugs. family history includes Breast cancer in her mother; Colon polyps in her mother; Crohn's disease in her sister; Heart disease in her father and mother; and Irritable bowel syndrome in an unspecified family member.  There is no history of Colon cancer. Allergies  Allergen Reactions  . Azathioprine     REACTION: pancreatitis  . Doxycycline     REACTION: rash  . Erythromycin   . Latex   . Meperidine Hcl   . Moxifloxacin   . Remicade (Infliximab)     Hives and redness   . Sulfa Drugs Cross Reactors     hives        Review of Systems: He denies  abdominal pain. Dysphagia chest pain shortness of breath  The remainder of the 10 point ROS is negative except as outlined in H&P   Physical Exam: General appearance  Well developed, in no distress mildly cushingoid. Eyes- non icteric. HEENT nontraumatic, normocephalic. Mouth no lesions, tongue papillated, no cheilosis. Neck supple without adenopathy, thyroid not enlarged, no carotid bruits, no JVD. Lungs Clear to auscultation bilaterally. Cor normal S1, normal S2, regular rhythm, no murmur,  quiet precordium. Abdomen: Obese. Soft. Diffuse mild tenderness. Normoactive bowel sounds, psoriasis like dermatitis around the umbilicus. Rectal: Not done Extremities no pedal edema. Skin no lesions. Neurological alert and oriented x 3. Psychological normal mood and affect.  Assessment and Plan:  Problem #1 Ulcerative colitis improved on a complex regimen of methotrexate, mesalamine, biologicals and topical steroids. She is still awaiting a disability hearing. She will continue the same regimen. I will see her in 3 months. She has recently had lab work which included normal LFTs, a normal blood count and slightly elevated TSH. She is up to date on her colonoscopy.  Problem #2 Gastroesophageal reflux. She is status post remote Nissen fundoplication. She is taking Nexium with no symptoms. His last endoscopy was in February 2011.  Problem #3 Psoriasis. She has follow up by Dr.Deveshwar.  Problem #4 -induced pancreatitis  Problem #5 Perimenopausal.   10/03/2011 [name]

## 2011-10-03 NOTE — Patient Instructions (Addendum)
Decrease your Cort Enema's to twice weekly. We will continue to give you a larger quantity than you currently need just in case we have to increase it again. We have sent the following medications to your pharmacy for you to pick up at your convenience: Zofran Please follow up with Dr Olevia Perches in 3 months. CC: Dr Nathen May Shaw,Dr Deterding, Dr Estanislado Pandy

## 2011-11-29 ENCOUNTER — Encounter: Payer: Self-pay | Admitting: Internal Medicine

## 2011-12-14 ENCOUNTER — Other Ambulatory Visit: Payer: Self-pay | Admitting: Internal Medicine

## 2012-01-23 ENCOUNTER — Ambulatory Visit: Payer: 59 | Admitting: Internal Medicine

## 2012-01-30 ENCOUNTER — Ambulatory Visit: Payer: 59 | Admitting: Internal Medicine

## 2012-03-05 ENCOUNTER — Other Ambulatory Visit (INDEPENDENT_AMBULATORY_CARE_PROVIDER_SITE_OTHER): Payer: 59

## 2012-03-05 ENCOUNTER — Encounter: Payer: Self-pay | Admitting: Internal Medicine

## 2012-03-05 ENCOUNTER — Ambulatory Visit (INDEPENDENT_AMBULATORY_CARE_PROVIDER_SITE_OTHER): Payer: 59 | Admitting: Internal Medicine

## 2012-03-05 VITALS — BP 104/78 | HR 76 | Ht 66.0 in | Wt 187.4 lb

## 2012-03-05 DIAGNOSIS — K5190 Ulcerative colitis, unspecified, without complications: Secondary | ICD-10-CM

## 2012-03-05 DIAGNOSIS — Z79899 Other long term (current) drug therapy: Secondary | ICD-10-CM

## 2012-03-05 DIAGNOSIS — D849 Immunodeficiency, unspecified: Secondary | ICD-10-CM

## 2012-03-05 LAB — SEDIMENTATION RATE: Sed Rate: 21 mm/hr (ref 0–22)

## 2012-03-05 MED ORDER — MESALAMINE 1.2 G PO TBEC: DELAYED_RELEASE_TABLET | ORAL | Status: DC

## 2012-03-05 NOTE — Patient Instructions (Addendum)
We have sent the following medications to your pharmacy for you to pick up at your convenience: [facility] has requested that you go to the basement for the following lab work before leaving today: Sed Rate CC: Dr Mayra Neer

## 2012-03-05 NOTE — Progress Notes (Signed)
[name] [birth year] MRN [id number]   History of Present Illness:  This is a 46 year old white female with ulcerative colitis since 2002 refractory to medical treatment. She has been intolerant to Imuran ( pancreatitis)  and biologicals ( Remicade ). She was able to taper off her prednisone several months ago and remains on methotrexate 10 mg daily and mesalamine 4.8 g daily. She is  doing well having 5-6 bowel movements a day without seeing blood. She denies abdominal pain. She has been granted disability and will start getting her benefits. She will be able to afford her medications now. She is being treated for psoriasis by Dr. Estanislado Pandy and is on Simponi monthly. Her last colonoscopy in February 2011 showed moderately active pancolitis. An upper endoscopy at that time showed her to be status post Nissen fundoplication which was done in 2003. Biopsies were negative for Barrett's esophagus. She has occasional dysphagia.  Past Medical History  Diagnosis Date  . Irritable bowel syndrome   . Arthritis   . Tachycardia   . Fibrocystic breast disease   . GERD (gastroesophageal reflux disease)   . Pancreatitis   . Ovarian cyst   . Hypothyroidism   . Hyperlipidemia   . Hypertension   . Psoriasis   . Ulcerative colitis    Past Surgical History  Procedure Date  . Tubal ligation   . Repair cystocele and rectocele   . Nissen fundoplication   . Thyroid ablation   . Ankle reconstruction     left  . Bladder tuck     reports that she quit smoking about 6 years ago. She has never used smokeless tobacco. She reports that she drinks alcohol. She reports that she does not use illicit drugs. family history includes Breast cancer in her mother; Colon polyps in her mother; Crohn's disease in her sister; Heart disease in her father and mother; and Irritable bowel syndrome in an unspecified family member.  There is no history of Colon cancer. Allergies  Allergen Reactions  . Azathioprine    REACTION: pancreatitis  . Doxycycline     REACTION: rash  . Erythromycin   . Latex   . Meperidine Hcl   . Moxifloxacin   . Remicade (Infliximab)     Hives and redness   . Sulfa Drugs Cross Reactors     hives        Review of Systems: Positive for occasional dysphagia. No odynophagia shortness of breath or chest pain The remainder of the 10 point ROS is negative except as outlined in H&P   Physical Exam: General appearance  Well developed, in no distress. Eyes- non icteric. HEENT nontraumatic, normocephalic. Mouth no lesions, tongue papillated, no cheilosis. Neck supple without adenopathy, thyroid not enlarged, no carotid bruits, no JVD. Lungs Clear to auscultation bilaterally. Cor normal S1, normal S2, regular rhythm, no murmur,  quiet precordium. Abdomen: Diffusely tender. More so right upper quadrant and epigastrium. Liver edge at costal margin. Normoactive bowel sounds. Rectal: Soft Hemoccult negative stool. Extremities no pedal edema. Skin extensive psoriatic lesions on her feet and hands Neurological alert and oriented x 3. Psychological normal mood and affect.  Assessment and Plan:  Problem #1 Ulcerative colitis of greater than 10 years duration since 2002. She is currently doing reasonably well on a combination of methotrexate and mesalamine. We are planning to continue the same regimen. Her next colonoscopy will be due around 2015. Her next appointment with me should be in 6 months. We will recheck her sedimentation rate today  as it has been mildly elevated. Problem#2 immunosupressive therapy- cont Mtx and Simponi Problem #3 Psoriasis- followed by Dr Estanislado Pandy   03/05/2012 [name]

## 2012-04-16 ENCOUNTER — Other Ambulatory Visit: Payer: Self-pay | Admitting: Family Medicine

## 2012-04-16 DIAGNOSIS — Z1231 Encounter for screening mammogram for malignant neoplasm of breast: Secondary | ICD-10-CM

## 2012-04-18 ENCOUNTER — Other Ambulatory Visit: Payer: Self-pay | Admitting: Orthopaedic Surgery

## 2012-04-18 DIAGNOSIS — M542 Cervicalgia: Secondary | ICD-10-CM

## 2012-04-18 DIAGNOSIS — M5410 Radiculopathy, site unspecified: Secondary | ICD-10-CM

## 2012-04-18 DIAGNOSIS — M25512 Pain in left shoulder: Secondary | ICD-10-CM

## 2012-04-18 DIAGNOSIS — R531 Weakness: Secondary | ICD-10-CM

## 2012-04-25 ENCOUNTER — Ambulatory Visit
Admission: RE | Admit: 2012-04-25 | Discharge: 2012-04-25 | Disposition: A | Discharge status: Home / Self Care | Payer: 59 | Source: Ambulatory Visit | Attending: Orthopaedic Surgery | Admitting: Orthopaedic Surgery

## 2012-04-25 DIAGNOSIS — M25512 Pain in left shoulder: Secondary | ICD-10-CM

## 2012-04-25 DIAGNOSIS — R531 Weakness: Secondary | ICD-10-CM

## 2012-04-25 DIAGNOSIS — M5410 Radiculopathy, site unspecified: Secondary | ICD-10-CM

## 2012-04-25 DIAGNOSIS — M542 Cervicalgia: Secondary | ICD-10-CM

## 2012-05-02 ENCOUNTER — Other Ambulatory Visit: Payer: Self-pay | Admitting: Internal Medicine

## 2012-05-06 ENCOUNTER — Ambulatory Visit
Admission: RE | Admit: 2012-05-06 | Discharge: 2012-05-06 | Disposition: A | Discharge status: Home / Self Care | Payer: 59 | Source: Ambulatory Visit | Attending: Family Medicine | Admitting: Family Medicine

## 2012-05-06 DIAGNOSIS — Z1231 Encounter for screening mammogram for malignant neoplasm of breast: Secondary | ICD-10-CM

## 2012-05-13 ENCOUNTER — Other Ambulatory Visit: Payer: Self-pay | Admitting: Family Medicine

## 2012-05-13 DIAGNOSIS — R928 Other abnormal and inconclusive findings on diagnostic imaging of breast: Secondary | ICD-10-CM

## 2012-05-20 ENCOUNTER — Ambulatory Visit
Admission: RE | Admit: 2012-05-20 | Discharge: 2012-05-20 | Disposition: A | Discharge status: Home / Self Care | Payer: 59 | Source: Ambulatory Visit | Attending: Family Medicine | Admitting: Family Medicine

## 2012-05-20 DIAGNOSIS — R928 Other abnormal and inconclusive findings on diagnostic imaging of breast: Secondary | ICD-10-CM

## 2012-06-05 ENCOUNTER — Other Ambulatory Visit: Payer: Self-pay | Admitting: *Deleted

## 2012-06-05 MED ORDER — MESALAMINE 1.2 G PO TBEC: DELAYED_RELEASE_TABLET | ORAL | Status: DC

## 2012-06-05 MED ORDER — METHOTREXATE 2.5 MG PO TABS: ORAL_TABLET | ORAL | Status: DC

## 2012-06-05 NOTE — Telephone Encounter (Signed)
Rx's were sent to [facility] several months ago for patient. Apparently, she now wants rx to go to [facility]. I have spoken to [place] at [facility] pharmacy to d/c any remaining prescription for both Lialda and Methotrexate and I will send new rx's to [facility].

## 2012-07-22 ENCOUNTER — Other Ambulatory Visit: Payer: Self-pay | Admitting: Rheumatology

## 2012-07-22 ENCOUNTER — Ambulatory Visit
Admission: RE | Admit: 2012-07-22 | Discharge: 2012-07-22 | Disposition: A | Discharge status: Home / Self Care | Payer: 59 | Source: Ambulatory Visit | Attending: Rheumatology | Admitting: Rheumatology

## 2012-07-22 DIAGNOSIS — M1990 Unspecified osteoarthritis, unspecified site: Secondary | ICD-10-CM

## 2012-12-13 ENCOUNTER — Ambulatory Visit: Payer: 59 | Admitting: Internal Medicine

## 2013-01-28 ENCOUNTER — Encounter: Payer: Self-pay | Admitting: Internal Medicine

## 2013-01-28 ENCOUNTER — Ambulatory Visit (INDEPENDENT_AMBULATORY_CARE_PROVIDER_SITE_OTHER): Payer: Medicare Other | Admitting: Internal Medicine

## 2013-01-28 VITALS — BP 120/76 | HR 70 | Ht 66.0 in | Wt 196.8 lb

## 2013-01-28 DIAGNOSIS — Z79899 Other long term (current) drug therapy: Secondary | ICD-10-CM

## 2013-01-28 DIAGNOSIS — R1013 Epigastric pain: Secondary | ICD-10-CM

## 2013-01-28 DIAGNOSIS — L409 Psoriasis, unspecified: Secondary | ICD-10-CM

## 2013-01-28 DIAGNOSIS — L408 Other psoriasis: Secondary | ICD-10-CM

## 2013-01-28 DIAGNOSIS — IMO0002 Reserved for concepts with insufficient information to code with codable children: Secondary | ICD-10-CM

## 2013-01-28 NOTE — Progress Notes (Signed)
[name] Sep 08, 1965 MRN [id number]   History of Present Illness:  This is a 47 year old white female with ulcerative colitis since 2002 refractory to medical treatment. She is now in symptomatic remission for a year since she has been granted medical disability. She is intolerant to Imuran and biologicals such as Remicade. Her last appointment was one year ago. She at that time was still on methotrexate for psoriasis and on Simponi from Dr Estanislado Pandy. Both medications have been discontinued. She is only on Lialda 2.4 g daily. She averages 4-5 bowel movements a day. She denies rectal bleeding. Her last colonoscopy was in February 2011. She has occasional crampy abdominal pain. She has a history of prolonged exposure to steroids. Her last bone density was in April 2009. She was also tried on cyclosporin and Humira in 2005-2006.   Past Medical History  Diagnosis Date  . Irritable bowel syndrome   . Arthritis   . Tachycardia   . Fibrocystic breast disease   . GERD (gastroesophageal reflux disease)   . Pancreatitis   . Ovarian cyst   . Hypothyroidism   . Hyperlipidemia   . Hypertension   . Psoriasis   . Ulcerative colitis    Past Surgical History  Procedure Laterality Date  . Tubal ligation    . Repair cystocele and rectocele    . Nissen fundoplication    . Thyroid ablation    . Ankle reconstruction      left  . Bladder tuck      reports that she quit smoking about 7 years ago. She has never used smokeless tobacco. She reports that  drinks alcohol. She reports that she does not use illicit drugs. family history includes Breast cancer in her mother; Colon polyps in her mother; Crohn's disease in her sister; Heart disease in her father and mother; Irritable bowel syndrome in an unspecified family member; and Other in her mother.  There is no history of Colon cancer. Allergies  Allergen Reactions  . Azathioprine     REACTION: pancreatitis  . Doxycycline     REACTION: rash  .  Erythromycin   . Latex   . Meperidine Hcl   . Moxifloxacin   . Remicade (Infliximab)     Hives and redness   . Sulfa Drugs Cross Reactors     hives        Review of Systems: Occasional abdominal pain. Denies diarrhea or rectal bleeding  The remainder of the 10 point ROS is negative except as outlined in H&P   Physical Exam: General appearance  Well developed, in no distress. Cushingoid features Eyes- non icteric. HEENT nontraumatic, normocephalic. Mouth no lesions, tongue papillated, no cheilosis. Neck supple without adenopathy, thyroid not enlarged, no carotid bruits, no JVD. Lungs Clear to auscultation bilaterally. Cor normal S1, normal S2, regular rhythm, no murmur,  quiet precordium. Abdomen: Protuberant and soft with minimal tenderness in epigastrium. Normoactive bowel sounds. No ascites. No palpable mass. Rectal: Soft Hemoccult negative stool. Extremities no pedal edema. Skin no lesions. Neurological alert and oriented x 3. Psychological normal mood and affect.  Assessment and Plan:  Problem #74 47 year old white female with severe inflammatory bowel disease which was in the past refractory to standard medications. She is currently on medical treatment using mesalamine 2.4 g daily with normal bowel movements. She is due for a repeat colonoscopy in the next 6 months. She will continue on Creon at 2.4 g daily.  Problem #2 Gastroesophageal reflux. Patient has a history of a Nissen  fundoplication and is complaining of epigastric distress. She has not been taking her Nexium or Prilosec. I have given her samples. She will take it daily. If symptoms continue, we will consider an upper endoscopy. Her last endoscopy was in February 2011. Biopsies were negative for Barrett's esophagus.  Problem #3 Prolonged exposure to steroids. Patient's last bone density was in April 2009. We will schedule an updated bone density.   01/28/2013 [name]

## 2013-01-28 NOTE — Patient Instructions (Addendum)
You have been scheduled for a bone density test on Friday, 01/31/13 @ 10:30 am on the basement floor of [facility] (Radiology).  You will be due for a recall colonoscopy in 06/2013. We will send you a reminder in the mail when it gets closer to that time.  CC: Dr Mayra Neer

## 2013-01-31 ENCOUNTER — Ambulatory Visit (INDEPENDENT_AMBULATORY_CARE_PROVIDER_SITE_OTHER)
Admission: RE | Admit: 2013-01-31 | Discharge: 2013-01-31 | Disposition: A | Discharge status: Home / Self Care | Payer: Medicare Other | Source: Ambulatory Visit | Attending: Internal Medicine | Admitting: Internal Medicine

## 2013-01-31 DIAGNOSIS — IMO0002 Reserved for concepts with insufficient information to code with codable children: Secondary | ICD-10-CM

## 2013-04-07 ENCOUNTER — Encounter: Payer: Self-pay | Admitting: Internal Medicine

## 2013-05-01 ENCOUNTER — Other Ambulatory Visit: Payer: Self-pay

## 2013-05-01 ENCOUNTER — Encounter: Payer: Self-pay | Admitting: Internal Medicine

## 2013-05-01 DIAGNOSIS — Z1231 Encounter for screening mammogram for malignant neoplasm of breast: Secondary | ICD-10-CM

## 2013-05-22 ENCOUNTER — Other Ambulatory Visit: Payer: Self-pay

## 2013-06-06 ENCOUNTER — Ambulatory Visit
Admission: RE | Admit: 2013-06-06 | Discharge: 2013-06-06 | Disposition: A | Discharge status: Home / Self Care | Payer: Self-pay | Source: Ambulatory Visit

## 2013-06-06 DIAGNOSIS — Z1231 Encounter for screening mammogram for malignant neoplasm of breast: Secondary | ICD-10-CM

## 2013-06-23 ENCOUNTER — Ambulatory Visit (AMBULATORY_SURGERY_CENTER): Payer: Medicare Other | Admitting: *Deleted

## 2013-06-23 ENCOUNTER — Encounter: Payer: Self-pay | Admitting: Internal Medicine

## 2013-06-23 VITALS — Ht 66.0 in | Wt 192.0 lb

## 2013-06-23 DIAGNOSIS — K5190 Ulcerative colitis, unspecified, without complications: Secondary | ICD-10-CM

## 2013-06-23 MED ORDER — MOVIPREP 100 G PO SOLR: ORAL | Status: DC

## 2013-06-23 NOTE — Progress Notes (Signed)
Patient denies any allergies to eggs or soy. Patient denies any problems with anesthesia.

## 2013-07-07 ENCOUNTER — Ambulatory Visit (AMBULATORY_SURGERY_CENTER): Payer: Medicare Other | Admitting: Internal Medicine

## 2013-07-07 ENCOUNTER — Encounter: Payer: Self-pay | Admitting: Internal Medicine

## 2013-07-07 VITALS — BP 135/72 | HR 74 | Temp 98.1°F | Resp 16 | Ht 66.0 in | Wt 192.0 lb

## 2013-07-07 DIAGNOSIS — K5190 Ulcerative colitis, unspecified, without complications: Secondary | ICD-10-CM

## 2013-07-07 DIAGNOSIS — D126 Benign neoplasm of colon, unspecified: Secondary | ICD-10-CM

## 2013-07-07 DIAGNOSIS — K5180 Other ulcerative colitis without complications: Secondary | ICD-10-CM

## 2013-07-07 MED ORDER — SODIUM CHLORIDE 0.9 % IV SOLN: 500.0000 mL | INTRAVENOUS | Status: DC

## 2013-07-07 NOTE — Patient Instructions (Signed)
YOU HAD AN ENDOSCOPIC PROCEDURE TODAY AT [facility]: Refer to the procedure report that was given to you for any specific questions about what was found during the examination.  If the procedure report does not answer your questions, please call your gastroenterologist to clarify.  If you requested that your care partner not be given the details of your procedure findings, then the procedure report has been included in a sealed envelope for you to review at your convenience later.  YOU SHOULD EXPECT: Some feelings of bloating in the abdomen. Passage of more gas than usual.  Walking can help get rid of the air that was put into your GI tract during the procedure and reduce the bloating. If you had a lower endoscopy (such as a colonoscopy or flexible sigmoidoscopy) you may notice spotting of blood in your stool or on the toilet paper. If you underwent a bowel prep for your procedure, then you may not have a normal bowel movement for a few days.  DIET: Your first meal following the procedure should be a light meal and then it is ok to progress to your normal diet.  A half-sandwich or bowl of soup is an example of a good first meal.  Heavy or fried foods are harder to digest and may make you feel nauseous or bloated.  Likewise meals heavy in dairy and vegetables can cause extra gas to form and this can also increase the bloating.  Drink plenty of fluids but you should avoid alcoholic beverages for 24 hours.  ACTIVITY: Your care partner should take you home directly after the procedure.  You should plan to take it easy, moving slowly for the rest of the day.  You can resume normal activity the day after the procedure however you should NOT DRIVE or use heavy machinery for 24 hours (because of the sedation medicines used during the test).    SYMPTOMS TO REPORT IMMEDIATELY: A gastroenterologist can be reached at any hour.  During normal business hours, 8:30 AM to 5:00 PM Monday through Friday,  call [phone].  After hours and on weekends, please call the GI answering service at [phone] who will take a message and have the physician on call contact you.   Following lower endoscopy (colonoscopy or flexible sigmoidoscopy):  Excessive amounts of blood in the stool  Significant tenderness or worsening of abdominal pains  Swelling of the abdomen that is new, acute  Fever of 100F or higher FOLLOW UP: If any biopsies were taken you will be contacted by phone or by letter within the next 1-3 weeks.  Call your gastroenterologist if you have not heard about the biopsies in 3 weeks.  Our staff will call the home number listed on your records the next business day following your procedure to check on you and address any questions or concerns that you may have at that time regarding the information given to you following your procedure. This is a courtesy call and so if there is no answer at the home number and we have not heard from you through the emergency physician on call, we will assume that you have returned to your regular daily activities without incident.  SIGNATURES/CONFIDENTIALITY: You and/or your care partner have signed paperwork which will be entered into your electronic medical record.  These signatures attest to the fact that that the information above on your After Visit Summary has been reviewed and is understood.  Full responsibility of the confidentiality of this discharge  information lies with you and/or your care-partner.   Handouts was given on ulcerative colitis and a high fiber diet. You may resume your current medications today.  Continue taking methotrexate and mesalamine. Please call if any questions or concerns.

## 2013-07-07 NOTE — Progress Notes (Signed)
Procedure ends, to recovery, report given and VSS. 

## 2013-07-07 NOTE — Progress Notes (Signed)
Called to room to assist during endoscopic procedure.  Patient ID and intended procedure confirmed with present staff. Received instructions for my participation in the procedure from the performing physician.  

## 2013-07-07 NOTE — Progress Notes (Signed)
No problems noted in the recovery room. Maw

## 2013-07-07 NOTE — Op Note (Signed)
[facility]  [address] [place], [id number]   COLONOSCOPY PROCEDURE REPORT  PATIENT: [name], [name]  MR#: [id number] BIRTHDATE: [birth year] , 44  yrs. old GENDER: Female ENDOSCOPIST: Lafayette Dragon, MD REFERRED [id number] Shaw, M.D. PROCEDURE DATE:  07/07/2013 PROCEDURE:   Colonoscopy with biopsy First Screening Colonoscopy - Avg.  risk and is 50 yrs.  old or older - No.  Prior Negative Screening - Now for repeat screening. N/A  History of Adenoma - Now for follow-up colonoscopy & has been > or = to 3 yrs.  N/A  Polyps Removed Today? No.  Recommend repeat exam, <10 yrs? Yes.  High risk (family or personal hx). ASA CLASS:   Class II INDICATIONS:ulcerative colitis since 2002.  Sr.  with Crohn's disease.  Last colonoscopy February 2011 showed pancolitis.  The patient has been on methotrexate and mesalamine, doing well. MEDICATIONS: MAC sedation, administered by CRNA and propofol (Diprivan) 439m IV  DESCRIPTION OF PROCEDURE:   After the risks benefits and alternatives of the procedure were thoroughly explained, informed consent was obtained.  A digital rectal exam revealed no abnormalities of the rectum.   The LB [id number] endoscope was introduced through the anus and advanced to the cecum, which was identified by both the appendix and ileocecal valve. No adverse events experienced.   The quality of the prep was excellent, using MoviPrep  The instrument was then slowly withdrawn as the colon was fully examined.      COLON FINDINGS: A normal appearing cecum, ileocecal valve, and appendiceal orifice were identified.  The ascending, hepatic flexure, transverse, splenic flexure, descending, sigmoid colon and rectum appeared unremarkable. there was mild edema in the mid descending colon the between 30-60 cm. Biopsies were obtained from right colon, transverse colon between 80 and 100 cm, descending colon between 30 and 60 cm and from the sigmoid 0-30  cm No polyps or cancers were seen.  Multiple random biopsies of the area were performed.  Retroflexed views revealed no abnormalities. The time to cecum=8.26 minutes 0 seconds.  Withdrawal time=minutes 10: 46 The scope was withdrawn and the procedure completed. COMPLICATIONS: There were no complications.  ENDOSCOPIC IMPRESSION: Normal colon; multiple random biopsies of the area were performed no evidence of active colitis  RECOMMENDATIONS: await biopsy results High fiber diet Continue methotrexate and mesalamine Recall colonoscopy in 3 years  eSigned:  DLafayette Dragon MD 07/07/2013 8:45 AM   cc:   PATIENT NAME:  MAshira, KirstenMR#: [id number]

## 2013-07-08 ENCOUNTER — Telehealth: Payer: Self-pay | Admitting: *Deleted

## 2013-07-08 NOTE — Telephone Encounter (Signed)
No answer, left message to call if questions or concerns. 

## 2013-07-11 ENCOUNTER — Encounter: Payer: Self-pay | Admitting: Internal Medicine

## 2013-10-30 ENCOUNTER — Ambulatory Visit
Admission: RE | Admit: 2013-10-30 | Discharge: 2013-10-30 | Disposition: A | Discharge status: Home / Self Care | Payer: Medicare Other | Source: Ambulatory Visit | Attending: Rheumatology | Admitting: Rheumatology

## 2013-10-30 ENCOUNTER — Other Ambulatory Visit: Payer: Self-pay | Admitting: Rheumatology

## 2013-10-30 DIAGNOSIS — Z9289 Personal history of other medical treatment: Secondary | ICD-10-CM

## 2014-03-16 ENCOUNTER — Telehealth: Payer: Self-pay | Admitting: *Deleted

## 2014-03-16 NOTE — Telephone Encounter (Signed)
Dr Olevia Perches, I have gotten a refill request from [facility] to have [facility] filled for patient. It appears per your last office visit that she was to be on Lialda 2.4 g daily. However, the last script that we have on file as being sent was for #360 back on 06/05/2012! Do you want me to go ahead and fill for 2.4 g daily or does she need office visit?

## 2014-03-16 NOTE — Telephone Encounter (Signed)
Please refill and ask her to set up an appointment.

## 2014-03-17 MED ORDER — MESALAMINE 1.2 G PO TBEC: 1200.0000 mg | DELAYED_RELEASE_TABLET | Freq: Two times a day (BID) | ORAL | Status: DC

## 2014-03-17 NOTE — Telephone Encounter (Signed)
Rx sent. Patient has an appointment on 04/07/14 for follow up.

## 2014-04-07 ENCOUNTER — Encounter: Payer: Self-pay | Admitting: Internal Medicine

## 2014-04-07 ENCOUNTER — Other Ambulatory Visit (INDEPENDENT_AMBULATORY_CARE_PROVIDER_SITE_OTHER): Payer: Medicare Other

## 2014-04-07 ENCOUNTER — Ambulatory Visit (INDEPENDENT_AMBULATORY_CARE_PROVIDER_SITE_OTHER): Payer: Medicare Other | Admitting: Internal Medicine

## 2014-04-07 VITALS — BP 104/70 | HR 72 | Ht 66.25 in | Wt 195.1 lb

## 2014-04-07 DIAGNOSIS — D849 Immunodeficiency, unspecified: Secondary | ICD-10-CM

## 2014-04-07 DIAGNOSIS — Z79899 Other long term (current) drug therapy: Secondary | ICD-10-CM

## 2014-04-07 DIAGNOSIS — K5190 Ulcerative colitis, unspecified, without complications: Secondary | ICD-10-CM

## 2014-04-07 LAB — IBC PANEL
Iron: 120 ug/dL (ref 42–145)
Saturation Ratios: 28 % (ref 20.0–50.0)
Transferrin: 306.2 mg/dL (ref 212.0–360.0)

## 2014-04-07 LAB — VITAMIN B12: Vitamin B-12: 509 pg/mL (ref 211–911)

## 2014-04-07 LAB — SEDIMENTATION RATE: Sed Rate: 17 mm/hr (ref 0–22)

## 2014-04-07 MED ORDER — MESALAMINE 1.2 G PO TBEC: 2.4000 g | DELAYED_RELEASE_TABLET | Freq: Two times a day (BID) | ORAL | Status: DC

## 2014-04-07 NOTE — Patient Instructions (Addendum)
We have sent the following medications to your pharmacy for you to pick up at your convenience: [facility] has requested that you go to the basement for the following lab work before leaving today: IBC, B12, sed rate  Please follow up with Dr Olevia Perches in 1 year.  We have contacted Dr Arlean Hopping office to get a copy of your labwork.  CC:Dr Deveshwar,Dr Derrill Memo

## 2014-04-07 NOTE — Progress Notes (Signed)
[name] 04/27/1966 [id number]  Note: This dictation was prepared with Dragon digital system. Any transcriptional errors that result from this procedure are unintentional.   History of Present Illness:  This is a 48-year-old white female with ulcerative colitis since 2002, followed by me since 2011. Her colitis is in remission. She has a history of prolonged exposure to steroids. A prior colonoscopy in 2011 showed pancolitis and most recently, in December 2014 showed normal transverse and right colon as well as chronic active colitis from 0-30 cm. She denies rectal bleeding or abdominal pain but has had some dysphagia to solids and discomfort with eating which she attributes to prior Nissen fundoplication. Her last upper endoscopy in February 2011 showed no evidence of Barrett's esophagus. She takes omeprazole 20 mg daily. She denies heartburn. Her current medications include mesalamine 2.4-4.8 g daily depending on the activity of her disease.     Past Medical History  Diagnosis Date  . Irritable bowel syndrome   . Arthritis   . Tachycardia   . Fibrocystic breast disease   . GERD (gastroesophageal reflux disease)   . Pancreatitis   . Ovarian cyst   . Hypothyroidism   . Hyperlipidemia   . Hypertension   . Psoriasis   . Ulcerative colitis     Past Surgical History  Procedure Laterality Date  . Tubal ligation    . Repair cystocele and rectocele    . Nissen fundoplication    . Thyroid ablation    . Ankle reconstruction      left  . Bladder tuck    . Knee surgery      left  . Nasal septum surgery    . Colonoscopy    . Partial hysterectomy      Allergies  Allergen Reactions  . Doxycycline     REACTION: rash  . Imuran [Azathioprine] Other (See Comments)    REACTION: pancreatitis  . Meperidine Hcl Hives  . Remicade [Infliximab]     Hives and redness   . Sulfa Drugs Cross Reactors     hives  . Avelox [Moxifloxacin] Rash  . Erythromycin Rash  . Latex Rash     Family history and social history have been reviewed.  Review of Systems:   The remainder of the 10 point ROS is negative except as outlined in the H&P  Physical Exam: General Appearance Well developed, in no distress Eyes  Non icteric  HEENT  Non traumatic, normocephalic  Mouth No lesion, tongue papillated, no cheilosis Neck Supple without adenopathy, thyroid not enlarged, no carotid bruits, no JVD Lungs Clear to auscultation bilaterally COR Normal S1, normal S2, regular rhythm, no murmur, quiet precordium Abdomen  history a. Normal active bowel sounds. Diffuse mild tenderness in all quadrants. No tympany Rectal  soft Hemoccult negative stool  Extremities  No pedal edema Skin  psoriatic lesions  Neurological Alert and oriented x 3 Psychological Normal mood and affect  Assessment and Plan:   Problem #1 48-year-old white female with ulcerative colitis who is on mesalamine. She is also on biologicals and methotrexate for psoriasis which I am sure is helping her colitis to stay under control. We will refill her prescriptions today and I will see her again in one year. Today, we'll will obtain iron studies, B12 and sed rate and we will obtain lab results from August done with Dr. Deveshwar.( reviewed CBC and c-met- normal).      04/07/2014      

## 2014-05-05 ENCOUNTER — Other Ambulatory Visit: Payer: Self-pay

## 2014-05-05 DIAGNOSIS — Z1231 Encounter for screening mammogram for malignant neoplasm of breast: Secondary | ICD-10-CM

## 2014-06-08 ENCOUNTER — Ambulatory Visit
Admission: RE | Admit: 2014-06-08 | Discharge: 2014-06-08 | Disposition: A | Discharge status: Home / Self Care | Payer: Medicare Other | Source: Ambulatory Visit

## 2014-06-08 DIAGNOSIS — Z1231 Encounter for screening mammogram for malignant neoplasm of breast: Secondary | ICD-10-CM

## 2014-11-09 ENCOUNTER — Ambulatory Visit
Admission: RE | Admit: 2014-11-09 | Discharge: 2014-11-09 | Disposition: A | Discharge status: Home / Self Care | Payer: Medicare Other | Source: Ambulatory Visit | Attending: Rheumatology | Admitting: Rheumatology

## 2014-11-09 ENCOUNTER — Other Ambulatory Visit: Payer: Self-pay | Admitting: Rheumatology

## 2014-11-09 DIAGNOSIS — Z9289 Personal history of other medical treatment: Secondary | ICD-10-CM

## 2015-01-11 ENCOUNTER — Encounter: Payer: Self-pay | Admitting: Internal Medicine

## 2015-05-17 ENCOUNTER — Telehealth: Payer: Self-pay | Admitting: Internal Medicine

## 2015-05-17 ENCOUNTER — Other Ambulatory Visit: Payer: Self-pay

## 2015-05-17 DIAGNOSIS — Z1231 Encounter for screening mammogram for malignant neoplasm of breast: Secondary | ICD-10-CM

## 2015-05-17 NOTE — Telephone Encounter (Signed)
Patient states that she started having diarrhea, Abd pain and some blood in the stools which started a few days ago. She states it comes and goes. She denies fever, nausea and vomiting. Patient requested an appointment. Appointment made for 05-31-15 at 1:45 with Cecille Rubin. Patient was advised to go to the ED or urgent care if symptoms get worse before her appointment on 05-31-15. Patient verbalized understanding.

## 2015-05-27 ENCOUNTER — Ambulatory Visit: Payer: Medicare Other | Admitting: Physician Assistant

## 2015-05-31 ENCOUNTER — Other Ambulatory Visit (INDEPENDENT_AMBULATORY_CARE_PROVIDER_SITE_OTHER): Payer: Medicare Other

## 2015-05-31 ENCOUNTER — Other Ambulatory Visit: Payer: Self-pay

## 2015-05-31 ENCOUNTER — Ambulatory Visit (INDEPENDENT_AMBULATORY_CARE_PROVIDER_SITE_OTHER): Payer: Medicare Other | Admitting: Physician Assistant

## 2015-05-31 ENCOUNTER — Encounter: Payer: Self-pay | Admitting: Physician Assistant

## 2015-05-31 ENCOUNTER — Telehealth: Payer: Self-pay

## 2015-05-31 VITALS — BP 132/86 | HR 80 | Ht 66.25 in | Wt 198.4 lb

## 2015-05-31 DIAGNOSIS — R197 Diarrhea, unspecified: Secondary | ICD-10-CM

## 2015-05-31 DIAGNOSIS — K51918 Ulcerative colitis, unspecified with other complication: Secondary | ICD-10-CM | POA: Diagnosis not present

## 2015-05-31 LAB — CBC WITH DIFFERENTIAL/PLATELET
Basophils Absolute: 0 10*3/uL (ref 0.0–0.1)
Basophils Relative: 0.6 % (ref 0.0–3.0)
Eosinophils Absolute: 0.1 10*3/uL (ref 0.0–0.7)
Eosinophils Relative: 1 % (ref 0.0–5.0)
HCT: 41 % (ref 36.0–46.0)
Hemoglobin: 13.7 g/dL (ref 12.0–15.0)
Lymphocytes Relative: 34 % (ref 12.0–46.0)
Lymphs Abs: 2.6 10*3/uL (ref 0.7–4.0)
MCHC: 33.3 g/dL (ref 30.0–36.0)
MCV: 99 fl (ref 78.0–100.0)
Monocytes Absolute: 0.5 10*3/uL (ref 0.1–1.0)
Monocytes Relative: 7 % (ref 3.0–12.0)
Neutro Abs: 4.4 10*3/uL (ref 1.4–7.7)
Neutrophils Relative %: 57.4 % (ref 43.0–77.0)
Platelets: 302 10*3/uL (ref 150.0–400.0)
RBC: 4.14 Mil/uL (ref 3.87–5.11)
RDW: 13.8 % (ref 11.5–15.5)
WBC: 7.7 10*3/uL (ref 4.0–10.5)

## 2015-05-31 LAB — BASIC METABOLIC PANEL
BUN: 16 mg/dL (ref 6–23)
CO2: 30 mEq/L (ref 19–32)
Calcium: 9.9 mg/dL (ref 8.4–10.5)
Chloride: 100 mEq/L (ref 96–112)
Creatinine, Ser: 0.83 mg/dL (ref 0.40–1.20)
GFR: 77.42 mL/min (ref >60.00)
Glucose, Bld: 89 mg/dL (ref 70–99)
Potassium: 4.3 mEq/L (ref 3.5–5.1)
Sodium: 139 mEq/L (ref 135–145)

## 2015-05-31 LAB — IBC PANEL
Iron: 85 ug/dL (ref 42–145)
Saturation Ratios: 19.9 % — ABNORMAL LOW (ref 20.0–50.0)
Transferrin: 305 mg/dL (ref 212.0–360.0)

## 2015-05-31 LAB — HEPATIC FUNCTION PANEL
ALT: 17 U/L (ref 0–35)
AST: 18 U/L (ref 0–37)
Albumin: 4.1 g/dL (ref 3.5–5.2)
Alkaline Phosphatase: 54 U/L (ref 39–117)
Bilirubin, Direct: 0.1 mg/dL (ref 0.0–0.3)
Total Bilirubin: 0.5 mg/dL (ref 0.2–1.2)
Total Protein: 7.6 g/dL (ref 6.0–8.3)

## 2015-05-31 LAB — HIGH SENSITIVITY CRP: CRP, High Sensitivity: 4.09 mg/L (ref 0.000–5.000)

## 2015-05-31 LAB — FERRITIN: Ferritin: 49.7 ng/mL (ref 10.0–291.0)

## 2015-05-31 MED ORDER — MESALAMINE 4 G RE ENEM: 4.0000 g | ENEMA | Freq: Every day | RECTAL | Status: DC

## 2015-05-31 MED ORDER — PREDNISONE 10 MG PO TABS: 10.0000 mg | ORAL_TABLET | Freq: Every day | ORAL | Status: DC

## 2015-05-31 MED ORDER — MESALAMINE 1.2 G PO TBEC: 4.8000 g | DELAYED_RELEASE_TABLET | Freq: Every day | ORAL | Status: DC

## 2015-05-31 NOTE — Patient Instructions (Addendum)
Your physician has requested that you go to the basement for the lab work before leaving today.   We have sent the following medications to your pharmacy for you to pick up at your convenience.   Prednisone taper dose.  For the FIRST 7 days take 4 tables a day. For 7 days take 3 1/2 tablets a day. For 7 days take 3 tablets a day. For 7 days take 2 1/2 tablets a day. For 7 days take 2 tablets a day. For 7 days take 1 1/2 tabs a day. For 7 days take 1 tablet a day. For 7 days take 1/2 tablets a day then STOP.  Please call to make a follow up in 3 weeks. [phone]

## 2015-05-31 NOTE — Telephone Encounter (Signed)
Left message for pt. [name] added a C.Diff lab to her orders.

## 2015-05-31 NOTE — Progress Notes (Signed)
Patient ID: [name], female   DOB: 1965-12-07, 49 y.o.   MRN: [id number]     History of Present Illness: [name] is a pleasant 49 year old female who was previously followed by Dr. Olevia Perches. She was diagnosed with ulcerative colitis in 2002. A prior colonoscopy in 2011 showed pancolitis, and repeat colonoscopy in December 2014 showed a normal transverse and right colon but chronic active colitis from 0-30 cm. She has a history of GERD and had a Nissen fundoplication approximately 12 years ago. She has intermittent dysphagia to solids and has required dilation in the past. Her last upper endoscopy in February 2011 showed no evidence of Barrett's esophagus. She takes omeprazole 20 mg daily.  She is here today because she feels her ulcerative colitis is flaring. She has a history of psoriatic arthritis for which she is followed by Dr. Estil Daft. She had tried Remicade but developed antibodies. She has used Humira, Enbrel, and symponi all of which did not control her psoriasis. She is currently on Stellara every 8 weeks and feels it is controlling her psoriasis better than the other agents have. She developed diarrhea, and urgency about a month ago. She has been having crampy left-sided abdominal pain. She has bloody mucus with her bowel movements and tenesmus. She has had no fever or chills. She has been using rectally although one tablet twice daily. She has also been on methotrexate 2.5 mg daily.   Past Medical History  Diagnosis Date  . Irritable bowel syndrome   . Arthritis   . Tachycardia   . Fibrocystic breast disease   . GERD (gastroesophageal reflux disease)   . Pancreatitis   . Ovarian cyst   . Hypothyroidism   . Hyperlipidemia   . Hypertension   . Psoriasis   . Ulcerative colitis     Past Surgical History  Procedure Laterality Date  . Tubal ligation    . Repair cystocele and rectocele    . Nissen fundoplication    . Thyroid ablation    . Ankle reconstruction     left  . Bladder tuck    . Knee surgery      left  . Nasal septum surgery    . Colonoscopy    . Partial hysterectomy     Family History  Problem Relation Age of Onset  . Breast cancer Mother   . Heart disease Mother   . Colon polyps Mother   . Heart disease Father   . Crohn's disease Sister   . Irritable bowel syndrome    . Colon cancer Neg Hx   . Other Mother     pre-cancerous tumor removed   Social History  Substance Use Topics  . Smoking status: Former Smoker    Quit date: 07/27/2005  . Smokeless tobacco: Never Used  . Alcohol Use: Yes     Comment: occasionaly   Current Outpatient Prescriptions  Medication Sig Dispense Refill  . estradiol (ESTRACE) 1 MG tablet Take 1 mg by mouth daily.    . folic acid (FOLVITE) 1 MG tablet Take 2 mg by mouth daily.    Marland Kitchen levothyroxine (SYNTHROID, LEVOTHROID) 125 MCG tablet Take 125 mcg by mouth daily.    . mesalamine (LIALDA) 1.2 G EC tablet Take 4 tablets (4.8 g total) by mouth daily with breakfast. 120 tablet 6  . methotrexate (RHEUMATREX) 2.5 MG tablet Take 25 mg by mouth once a week. Take 8 tablets by mouth once weekly    . omeprazole (PRILOSEC) 20 MG  capsule Take 20 mg by mouth daily.    . pravastatin (PRAVACHOL) 40 MG tablet Take 80 mg by mouth daily.     . ustekinumab (STELARA) 45 MG/0.5ML SOSY injection Inject 45 mg into the skin every 3 (three) months.    . mesalamine (ROWASA) 4 G enema Place 60 mLs (4 g total) rectally at bedtime. 28 Bottle 0  . predniSONE (DELTASONE) 10 MG tablet Take 1 tablet (10 mg total) by mouth daily with breakfast. 100 tablet 0   No current facility-administered medications for this visit.   Allergies  Allergen Reactions  . Doxycycline     REACTION: rash  . Imuran [Azathioprine] Other (See Comments)    REACTION: pancreatitis  . Meperidine Hcl Hives  . Remicade [Infliximab]     Hives and redness   . Sulfa Drugs Cross Reactors     hives  . Avelox [Moxifloxacin] Rash  . Erythromycin Rash  . Latex  Rash     Review of Systems: Gen: Denies any fever, chills, sweats, anorexia, fatigue, weakness, malaise, weight loss, and sleep disorder CV: Denies chest pain, angina, palpitations, syncope, orthopnea, PND, peripheral edema, and claudication. Resp: Denies dyspnea at rest, dyspnea with exercise, cough, sputum, wheezing, coughing up blood, and pleurisy. GI: Denies vomiting blood, jaundice, and fecal incontinence.   Has intermittent dysphagia to solids GU : Denies urinary burning, blood in urine, urinary frequency, urinary hesitancy, nocturnal urination, and urinary incontinence. MS: Has intermittent joint pain Derm: Has patches of psoriasis Psych: Denies depression, anxiety, memory loss, suicidal ideation, hallucinations, paranoia, and confusion. Heme: Denies bruising, bleeding, and enlarged lymph nodes. Neuro:  Denies any headaches, dizziness, paresthesia Endo:  Denies any problems with DM, adrenal     Physical Exam: BP 132/86 mmHg  Pulse 80  Ht 5' 6.25" (1.683 m)  Wt 198 lb 6 oz (89.982 kg)  BMI 31.77 kg/m2 General: Pleasant, well developed ,  female in no acute distress Head: Normocephalic and atraumatic Eyes:  sclerae anicteric, conjunctiva pink  Ears: Normal auditory acuity Lungs: Clear throughout to auscultation Heart: Regular rate and rhythm Abdomen: Soft, non distended, non-tender. No masses, no hepatomegaly. Normal bowel sounds Musculoskeletal: Symmetrical with no gross deformities  Extremities: No edema  Neurological: Alert oriented x 4, grossly nonfocal Psychological:  Alert and cooperative. Normal mood and affect  Assessment and Recommendations:  49 year old female with a long-standing history of UC and psoriatic arthritis here with what appears to be a flare of UC. She has been instructed to increase her early Aldo to 4 tablets daily. She will use Rowasa enema one per rectum daily at bedtime. She will be started on a prednisone taper 10 mg tablets 4 tabs daily for 7  days then decrease to 3-1/2 tablets daily for 7 days, then decrease to 3 tabs daily for 7 days, then decrease to 2-1/2 tabs daily for 7 days and so forth to complete. A stool for C. difficile will be obtained. A CBC with differential, basic metabolic panel, hepatic function panel, high sensitivity CRP, and iron studies will be obtained. She will follow-up in 3 weeks, sooner if needed. She will be due for surveillance colonoscopy and would like to schedule this early in the new year. She has an appointment in early January with Dr. Silverio Decamp at which time she would like to discuss this.       Daysie Helf, Vita Barley PA-C 05/31/2015,

## 2015-06-01 NOTE — Progress Notes (Signed)
Reviewed and agree with documentation and assessment and plan. K. Veena Axelle Szwed , MD   

## 2015-06-03 ENCOUNTER — Ambulatory Visit (INDEPENDENT_AMBULATORY_CARE_PROVIDER_SITE_OTHER): Payer: 59 | Admitting: Psychology

## 2015-06-03 DIAGNOSIS — F4323 Adjustment disorder with mixed anxiety and depressed mood: Secondary | ICD-10-CM | POA: Diagnosis not present

## 2015-06-04 ENCOUNTER — Telehealth: Payer: Self-pay

## 2015-06-04 NOTE — Telephone Encounter (Signed)
Created in error

## 2015-06-21 ENCOUNTER — Ambulatory Visit
Admission: RE | Admit: 2015-06-21 | Discharge: 2015-06-21 | Disposition: A | Discharge status: Home / Self Care | Payer: Medicare Other | Source: Ambulatory Visit

## 2015-06-21 DIAGNOSIS — Z1231 Encounter for screening mammogram for malignant neoplasm of breast: Secondary | ICD-10-CM

## 2015-06-23 ENCOUNTER — Other Ambulatory Visit: Payer: Self-pay | Admitting: Family Medicine

## 2015-06-23 DIAGNOSIS — R928 Other abnormal and inconclusive findings on diagnostic imaging of breast: Secondary | ICD-10-CM

## 2015-07-01 ENCOUNTER — Ambulatory Visit (INDEPENDENT_AMBULATORY_CARE_PROVIDER_SITE_OTHER): Payer: 59 | Admitting: Psychology

## 2015-07-01 DIAGNOSIS — F4323 Adjustment disorder with mixed anxiety and depressed mood: Secondary | ICD-10-CM

## 2015-07-05 ENCOUNTER — Ambulatory Visit
Admission: RE | Admit: 2015-07-05 | Discharge: 2015-07-05 | Disposition: A | Discharge status: Home / Self Care | Payer: Medicare Other | Source: Ambulatory Visit | Attending: Family Medicine | Admitting: Family Medicine

## 2015-07-05 DIAGNOSIS — R928 Other abnormal and inconclusive findings on diagnostic imaging of breast: Secondary | ICD-10-CM

## 2015-07-22 ENCOUNTER — Other Ambulatory Visit (INDEPENDENT_AMBULATORY_CARE_PROVIDER_SITE_OTHER): Payer: Medicare Other

## 2015-07-22 ENCOUNTER — Ambulatory Visit (INDEPENDENT_AMBULATORY_CARE_PROVIDER_SITE_OTHER): Payer: Medicare Other | Admitting: Gastroenterology

## 2015-07-22 ENCOUNTER — Ambulatory Visit: Payer: Medicare Other | Admitting: Gastroenterology

## 2015-07-22 ENCOUNTER — Encounter: Payer: Self-pay | Admitting: Gastroenterology

## 2015-07-22 VITALS — BP 118/86 | HR 76 | Ht 66.25 in | Wt 197.1 lb

## 2015-07-22 DIAGNOSIS — K529 Noninfective gastroenteritis and colitis, unspecified: Secondary | ICD-10-CM

## 2015-07-22 DIAGNOSIS — K6389 Other specified diseases of intestine: Secondary | ICD-10-CM

## 2015-07-22 LAB — VITAMIN B12: Vitamin B-12: 493 pg/mL (ref 211–911)

## 2015-07-22 LAB — HEPATIC FUNCTION PANEL
ALT: 24 U/L (ref 0–35)
AST: 25 U/L (ref 0–37)
Albumin: 3.9 g/dL (ref 3.5–5.2)
Alkaline Phosphatase: 52 U/L (ref 39–117)
Bilirubin, Direct: 0.1 mg/dL (ref 0.0–0.3)
Total Bilirubin: 0.5 mg/dL (ref 0.2–1.2)
Total Protein: 6.9 g/dL (ref 6.0–8.3)

## 2015-07-22 LAB — CBC WITH DIFFERENTIAL/PLATELET
Basophils Absolute: 0 10*3/uL (ref 0.0–0.1)
Basophils Relative: 0.7 % (ref 0.0–3.0)
Eosinophils Absolute: 0.1 10*3/uL (ref 0.0–0.7)
Eosinophils Relative: 1.3 % (ref 0.0–5.0)
HCT: 40.4 % (ref 36.0–46.0)
Hemoglobin: 13.5 g/dL (ref 12.0–15.0)
Lymphocytes Relative: 32.2 % (ref 12.0–46.0)
Lymphs Abs: 2 10*3/uL (ref 0.7–4.0)
MCHC: 33.4 g/dL (ref 30.0–36.0)
MCV: 98.2 fl (ref 78.0–100.0)
Monocytes Absolute: 0.6 10*3/uL (ref 0.1–1.0)
Monocytes Relative: 8.9 % (ref 3.0–12.0)
Neutro Abs: 3.6 10*3/uL (ref 1.4–7.7)
Neutrophils Relative %: 56.9 % (ref 43.0–77.0)
Platelets: 272 10*3/uL (ref 150.0–400.0)
RBC: 4.11 Mil/uL (ref 3.87–5.11)
RDW: 12.6 % (ref 11.5–15.5)
WBC: 6.2 10*3/uL (ref 4.0–10.5)

## 2015-07-22 LAB — BASIC METABOLIC PANEL
BUN: 13 mg/dL (ref 6–23)
CO2: 27 mEq/L (ref 19–32)
Calcium: 9.2 mg/dL (ref 8.4–10.5)
Chloride: 103 mEq/L (ref 96–112)
Creatinine, Ser: 0.75 mg/dL (ref 0.40–1.20)
GFR: 86.98 mL/min (ref >60.00)
Glucose, Bld: 96 mg/dL (ref 70–99)
Potassium: 4.2 mEq/L (ref 3.5–5.1)
Sodium: 136 mEq/L (ref 135–145)

## 2015-07-22 LAB — FERRITIN: Ferritin: 58.2 ng/mL (ref 10.0–291.0)

## 2015-07-22 LAB — SEDIMENTATION RATE: Sed Rate: 16 mm/hr (ref 0–22)

## 2015-07-22 LAB — C-REACTIVE PROTEIN: CRP: 0.3 mg/dL — ABNORMAL LOW (ref 0.5–20.0)

## 2015-07-22 LAB — FOLATE: Folate: 19.8 ng/mL (ref >5.9)

## 2015-07-22 MED ORDER — NA SULFATE-K SULFATE-MG SULF 17.5-3.13-1.6 GM/177ML PO SOLN: 1.0000 | Freq: Once | ORAL | Status: DC

## 2015-07-22 NOTE — Progress Notes (Signed)
[name]    [id number]    Jun 13, 1966  Primary Care Physician:SHAW,KIMBERLEE, MD  Referring Physician: Mayra Neer, MD [facility] [place], [place] [address]  Chief complaint:  Ulcerative colitis  HPI: Past GI history: [name] is a pleasant 50 year old female who was previously followed by Dr. Olevia Perches. She was diagnosed with ulcerative colitis in 2002. A prior colonoscopy in 2011 showed pancolitis, and repeat colonoscopy in December 2014 showed a normal transverse and right colon but chronic active colitis from 0-30 cm. She has a history of psoriatic arthritis for which she is followed by Dr. Estil Daft. She had tried Remicade but developed antibodies. She has used Humira, Enbrel, and symponi all of which did not control her psoriasis. She is currently on Stellara every 8 weeks and feels it is controlling her psoriasis better than the other agents have. She is also on methotrexate 2.5 mg daily. She has a history of GERD and had a Nissen fundoplication approximately 12 years ago. She has intermittent dysphagia to solids and has required dilation in the past. Her last upper endoscopy in February 2011 showed no evidence of Barrett's esophagus. She takes omeprazole 20 mg daily  She was seen in November 2016 for a flare of her ulcerative colitis and was started on steroid taper which she completed about 2 weeks ago.she reports feeling better since she was on steroid taper, no increased bowel frequency abdominal pain or blood in stool . She continues to have intermittent dysphagia especially with solids but no food impactions . No odynophagia . Her weight has been stable      Outpatient Encounter Prescriptions as of 07/22/2015  Medication Sig  . clonazePAM (KLONOPIN) 1 MG tablet Take 1 mg by mouth as needed for anxiety.  Marland Kitchen estradiol (ESTRACE) 1 MG tablet Take 1 mg by mouth daily.  . folic acid (FOLVITE) 1 MG tablet Take 2 mg by mouth daily.  Marland Kitchen  levothyroxine (SYNTHROID, LEVOTHROID) 125 MCG tablet Take 125 mcg by mouth daily.  . mesalamine (LIALDA) 1.2 G EC tablet Take 4 tablets (4.8 g total) by mouth daily with breakfast.  . mesalamine (ROWASA) 4 G enema Place 60 mLs (4 g total) rectally at bedtime.  . methotrexate (RHEUMATREX) 2.5 MG tablet Take 25 mg by mouth once a week. Take 10 tablets by mouth once weekly  . omeprazole (PRILOSEC) 20 MG capsule Take 20 mg by mouth daily.  . pravastatin (PRAVACHOL) 40 MG tablet Take 80 mg by mouth daily.   . predniSONE (DELTASONE) 10 MG tablet Take 1 tablet (10 mg total) by mouth daily with breakfast.  . sertraline (ZOLOFT) 100 MG tablet Take 100 mg by mouth daily.  . ustekinumab (STELARA) 45 MG/0.5ML SOSY injection Inject 45 mg into the skin every 3 (three) months.   No facility-administered encounter medications on file as of 07/22/2015.    Allergies as of 07/22/2015 - Review Complete 07/22/2015  Allergen Reaction Noted  . Doxycycline    . Imuran [azathioprine] Other (See Comments) 08/24/2009  . Meperidine hcl Hives 08/24/2009  . Remicade [infliximab]  01/26/2011  . Sulfa drugs cross reactors  08/04/2011  . Avelox [moxifloxacin] Rash 08/24/2009  . Erythromycin Rash 08/24/2009  . Latex Rash 08/24/2009    Past Medical History  Diagnosis Date  . Irritable bowel syndrome   . Arthritis   . Tachycardia   . Fibrocystic breast disease   . GERD (gastroesophageal reflux disease)   .  Pancreatitis   . Ovarian cyst   . Hypothyroidism   . Hyperlipidemia   . Hypertension   . Psoriasis   . Ulcerative colitis     Past Surgical History  Procedure Laterality Date  . Tubal ligation    . Repair cystocele and rectocele    . Nissen fundoplication    . Thyroid ablation    . Ankle reconstruction      left  . Bladder tuck    . Knee surgery      left  . Nasal septum surgery    . Colonoscopy    . Partial hysterectomy      Family History  Problem Relation Age of Onset  . Breast cancer  Mother   . Heart disease Mother   . Colon polyps Mother   . Heart disease Father   . Crohn's disease Sister   . Irritable bowel syndrome    . Colon cancer Neg Hx   . Other Mother     pre-cancerous tumor removed    Social History   Social History  . Marital Status: Married    Spouse Name: N/A  . Number of Children: N/A  . Years of Education: N/A   Occupational History  . 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 Smoking status: Former Smoker    Quit date: 07/27/2005  . Smokeless tobacco: Never Used  . Alcohol Use: Yes     Comment: occasionaly  . Drug Use: No  . Sexual Activity: Not on file   Other Topics Concern  . Not on file   Social History Narrative      Review of systems: Review of Systems  Constitutional: Negative for fever and chills.  HENT: Negative.   Eyes: Negative for blurred vision.  Respiratory: Negative for cough, shortness of breath and wheezing.   Cardiovascular: Negative for chest pain and palpitations.  Gastrointestinal: as per HPI Genitourinary: Negative for dysuria, urgency, frequency and hematuria.  Musculoskeletal: Negative for myalgias, back pain and joint pain.  Skin: Negative for itching and rash.  Neurological: Negative for dizziness, tremors, focal weakness, seizures and loss of consciousness.  Endo/Heme/Allergies: Negative for environmental allergies.  Psychiatric/Behavioral: Negative for depression, suicidal ideas and hallucinations.  All other systems reviewed and are negative.   Physical Exam: Filed Vitals:   07/22/15 0847  BP: 118/86  Pulse: 76   Gen:      No acute distress HEENT:  EOMI, sclera anicteric Neck:     No masses; no thyromegaly Lungs:    Clear to auscultation bilaterally; normal respiratory effort CV:         Regular rate and rhythm; no murmurs Abd:      + bowel sounds; soft, non-tender; no palpable masses, no distension Ext:    No edema; adequate peripheral  perfusion Skin:      Warm and dry; no rash Neuro: alert and oriented x 3 Psych: normal mood and affect  Data Reviewed: as per history of present illness   Assessment and Plan/Recommendations: 50 year old female with history of psoriasis and ulcerative colitis on methotrexate and Stelara here for follow-up visit after recent flare in November 2016 which was managed with steroid taper We will check her CBC, BMP, LFT, ESR, CRP, B12/ folate and ferritin She is due for surveillance colonoscopy and we'll also assess disease activity in the colon Return in 3 months  K. Denzil Magnuson , MD [phone] Mon-Fri 8a-5p [phone] after 5p,  weekends, holidays

## 2015-07-22 NOTE — Patient Instructions (Signed)
You have been scheduled for a colonoscopy. Please follow written instructions given to you at your visit today.  Please pick up your prep supplies at the pharmacy within the next 1-3 days. If you use inhalers (even only as needed), please bring them with you on the day of your procedure. Your physician has requested that you go to www.startemmi.com and enter the access code given to you at your visit today. This web site gives a general overview about your procedure. However, you should still follow specific instructions given to you by our office regarding your preparation for the procedure.  Go to the basement for labs Follow up in 3 months

## 2015-07-27 ENCOUNTER — Ambulatory Visit (AMBULATORY_SURGERY_CENTER): Payer: Medicare Other | Admitting: Gastroenterology

## 2015-07-27 ENCOUNTER — Encounter: Payer: Self-pay | Admitting: Gastroenterology

## 2015-07-27 VITALS — BP 96/58 | HR 70 | Temp 97.6°F | Resp 30 | Ht 66.25 in | Wt 197.0 lb

## 2015-07-27 DIAGNOSIS — K529 Noninfective gastroenteritis and colitis, unspecified: Secondary | ICD-10-CM

## 2015-07-27 DIAGNOSIS — K6389 Other specified diseases of intestine: Secondary | ICD-10-CM

## 2015-07-27 DIAGNOSIS — D123 Benign neoplasm of transverse colon: Secondary | ICD-10-CM

## 2015-07-27 MED ORDER — SODIUM CHLORIDE 0.9 % IV SOLN: 500.0000 mL | INTRAVENOUS | Status: DC

## 2015-07-27 NOTE — Progress Notes (Signed)
Patient awakening,vss,report to rn 

## 2015-07-27 NOTE — Op Note (Signed)
[facility]  [address] [place], [id number]   COLONOSCOPY PROCEDURE REPORT  PATIENT: [name], [name]  MR#: [id number] BIRTHDATE: 12-28-1965 , 35  yrs. old GENDER: female ENDOSCOPIST: Harl Bowie, MD REFERRED XB:JYNWGNFAO Shaw, M.D. PROCEDURE DATE:  07/27/2015 PROCEDURE:   Colonoscopy, surveillance and Colonoscopy with cold biopsy polypectomy First Screening Colonoscopy - Avg.  risk and is 50 yrs.  old or older - No.  Prior Negative Screening - Now for repeat screening. N/A  History of Adenoma - Now for follow-up colonoscopy & has been > or = to 3 yrs.  N/A  Polyps removed today? Yes ASA CLASS:   Class II INDICATIONS:Inflammatory bowel disease of the intestine if more precise diagnosis or determination of the extent / severity of activity of disease will influence immediate / future management and Inflammatory Bowel Disease (at least 8 years pancolitis or 15 years left sided colitis). MEDICATIONS: Propofol 450 mg IV  DESCRIPTION OF PROCEDURE:   After the risks benefits and alternatives of the procedure were thoroughly explained, informed consent was obtained.  The digital rectal exam revealed no abnormalities of the rectum.   The LB [id number]  endoscope was introduced through the anus and advanced to the terminal ileum which was intubated for a short distance. No adverse events experienced.   The quality of the prep was good.  The instrument was then slowly withdrawn as the colon was fully examined. Estimated blood loss is zero unless otherwise noted in this procedure report.   COLON FINDINGS: The examined terminal ileum appeared to be normal. A sessile polyp ranging between 3-62m in size was found at the hepatic flexure.  A polypectomy was performed with cold forceps. The resection was complete, the polyp tissue was completely retrieved and sent to histology.   The colonic mucosa appeared normal in the descending colon, transverse colon, and  ascending colon.   Mild erythema with decreased vascularity in the sigmoid colon and rectum.  Retroflexed views revealed internal hemorrhoids. The time to cecum = 3.9 Withdrawal time = 16.1   The scope was withdrawn and the procedure completed. COMPLICATIONS: There were no immediate complications.  ENDOSCOPIC IMPRESSION: 1.   The examined terminal ileum appeared to be normal 2.   Sessile polyp ranging between 3-53min size was found at the hepatic flexure; polypectomy was performed with cold forceps 3.   The colonic mucosa appeared normal in the descending colon, transverse colon, and ascending colon 4.   Mild erythema with decreased vascularity in the sigmoid colon and rectum  RECOMMENDATIONS: Repeat colonoscopy in 2 years You will receive a letter within 1-2 weeks with the results of your biopsy as well as final recommendations.  Please call my office if you have not received a letter after 3 weeks.  eSigned:  KaHarl BowieMD 07/27/2015 3:38 PM

## 2015-07-27 NOTE — Patient Instructions (Signed)
YOU HAD AN ENDOSCOPIC PROCEDURE TODAY AT [facility]:   Refer to the procedure report that was given to you for any specific questions about what was found during the examination.  If the procedure report does not answer your questions, please call your gastroenterologist to clarify.  If you requested that your care partner not be given the details of your procedure findings, then the procedure report has been included in a sealed envelope for you to review at your convenience later.  YOU SHOULD EXPECT: Some feelings of bloating in the abdomen. Passage of more gas than usual.  Walking can help get rid of the air that was put into your GI tract during the procedure and reduce the bloating. If you had a lower endoscopy (such as a colonoscopy or flexible sigmoidoscopy) you may notice spotting of blood in your stool or on the toilet paper. If you underwent a bowel prep for your procedure, you may not have a normal bowel movement for a few days.  Please Note:  You might notice some irritation and congestion in your nose or some drainage.  This is from the oxygen used during your procedure.  There is no need for concern and it should clear up in a day or so.  SYMPTOMS TO REPORT IMMEDIATELY:   Following lower endoscopy (colonoscopy or flexible sigmoidoscopy):  Excessive amounts of blood in the stool  Significant tenderness or worsening of abdominal pains  Swelling of the abdomen that is new, acute  Fever of 100F or higher   For urgent or emergent issues, a gastroenterologist can be reached at any hour by calling [phone].   DIET: Your first meal following the procedure should be a small meal and then it is ok to progress to your normal diet. Heavy or fried foods are harder to digest and may make you feel nauseous or bloated.  Likewise, meals heavy in dairy and vegetables can increase bloating.  Drink plenty of fluids but you should avoid alcoholic beverages for 24  hours.  ACTIVITY:  You should plan to take it easy for the rest of today and you should NOT DRIVE or use heavy machinery until tomorrow (because of the sedation medicines used during the test).    FOLLOW UP: Our staff will call the number listed on your records the next business day following your procedure to check on you and address any questions or concerns that you may have regarding the information given to you following your procedure. If we do not reach you, we will leave a message.  However, if you are feeling well and you are not experiencing any problems, there is no need to return our call.  We will assume that you have returned to your regular daily activities without incident.  If any biopsies were taken you will be contacted by phone or by letter within the next 1-3 weeks.  Please call us at [phone] if you have not heard about the biopsies in 3 weeks.    SIGNATURES/CONFIDENTIALITY: You and/or your care partner have signed paperwork which will be entered into your electronic medical record.  These signatures attest to the fact that that the information above on your After Visit Summary has been reviewed and is understood.  Full responsibility of the confidentiality of this discharge information lies with you and/or your care-partner.  Polyp information given.

## 2015-07-27 NOTE — Progress Notes (Signed)
Called to room to assist during endoscopic procedure.  Patient ID and intended procedure confirmed with present staff. Received instructions for my participation in the procedure from the performing physician.  

## 2015-07-28 ENCOUNTER — Telehealth: Payer: Self-pay

## 2015-07-28 ENCOUNTER — Encounter: Payer: Self-pay | Admitting: Gastroenterology

## 2015-07-28 NOTE — Telephone Encounter (Signed)
  Follow up Call-  Call back number 07/27/2015 07/07/2013  Post procedure Call Back phone  # [phone]  Permission to leave phone message Yes Yes     Patient questions:  Do you have a fever, pain , or abdominal swelling? No. Pain Score  0 *  Have you tolerated food without any problems? Yes.    Have you been able to return to your normal activities? Yes.    Do you have any questions about your discharge instructions: Diet   No. Medications  No. Follow up visit  No.  Do you have questions or concerns about your Care? No.  Actions: * If pain score is 4 or above: No action needed, pain <4.

## 2015-08-09 ENCOUNTER — Encounter: Payer: Self-pay | Admitting: Gastroenterology

## 2015-08-12 ENCOUNTER — Ambulatory Visit: Payer: 59 | Admitting: Psychology

## 2015-09-16 ENCOUNTER — Ambulatory Visit: Payer: 59 | Admitting: Psychology

## 2015-10-07 ENCOUNTER — Encounter (INDEPENDENT_AMBULATORY_CARE_PROVIDER_SITE_OTHER): Payer: 59 | Admitting: Psychology

## 2015-10-07 DIAGNOSIS — F4323 Adjustment disorder with mixed anxiety and depressed mood: Secondary | ICD-10-CM | POA: Diagnosis not present

## 2016-01-06 ENCOUNTER — Ambulatory Visit: Payer: 59 | Admitting: Psychology

## 2016-02-14 ENCOUNTER — Other Ambulatory Visit: Payer: Self-pay | Admitting: Rheumatology

## 2016-02-14 ENCOUNTER — Ambulatory Visit
Admission: RE | Admit: 2016-02-14 | Discharge: 2016-02-14 | Disposition: A | Discharge status: Home / Self Care | Payer: Medicare Other | Source: Ambulatory Visit | Attending: Rheumatology | Admitting: Rheumatology

## 2016-02-14 DIAGNOSIS — R7611 Nonspecific reaction to tuberculin skin test without active tuberculosis: Secondary | ICD-10-CM

## 2016-02-14 DIAGNOSIS — Z9225 Personal history of immunosupression therapy: Secondary | ICD-10-CM

## 2016-02-14 LAB — HEPATIC FUNCTION PANEL
ALT: 19 U/L (ref 7–35)
AST: 21 U/L (ref 13–35)
Bilirubin, Total: 0.6 mg/dL

## 2016-02-14 LAB — BASIC METABOLIC PANEL
BUN: 16 mg/dL (ref 4–21)
Creatinine: 0.8 mg/dL (ref 0.5–1.1)
Glucose: 56 mg/dL
Potassium: 4.2 mmol/L (ref 3.4–5.3)
Sodium: 140 mmol/L (ref 137–147)

## 2016-02-14 LAB — CBC AND DIFFERENTIAL
HCT: 40 % (ref 36–46)
Hemoglobin: 13.5 g/dL (ref 12.0–16.0)
Platelets: 284 10*3/uL (ref 150–399)
WBC: 7.6 10^3/mL

## 2016-05-12 ENCOUNTER — Encounter: Payer: Self-pay | Admitting: *Deleted

## 2016-05-12 DIAGNOSIS — L4050 Arthropathic psoriasis, unspecified: Secondary | ICD-10-CM

## 2016-05-12 HISTORY — DX: Arthropathic psoriasis, unspecified: L40.50

## 2016-05-13 DIAGNOSIS — Z79899 Other long term (current) drug therapy: Secondary | ICD-10-CM | POA: Insufficient documentation

## 2016-05-13 DIAGNOSIS — M2242 Chondromalacia patellae, left knee: Secondary | ICD-10-CM | POA: Insufficient documentation

## 2016-05-13 DIAGNOSIS — M25561 Pain in right knee: Secondary | ICD-10-CM | POA: Insufficient documentation

## 2016-05-13 DIAGNOSIS — M25562 Pain in left knee: Secondary | ICD-10-CM

## 2016-05-13 DIAGNOSIS — M2241 Chondromalacia patellae, right knee: Secondary | ICD-10-CM | POA: Insufficient documentation

## 2016-05-13 DIAGNOSIS — M7050 Other bursitis of knee, unspecified knee: Secondary | ICD-10-CM | POA: Insufficient documentation

## 2016-05-13 NOTE — Progress Notes (Addendum)
*IMAGE* Office Visit Note  Patient: [name]             Date of Birth: [birth year]           MRN: [id number]             PCP: Mayra Neer, MD Referring: Mayra Neer, MD Visit Date: 05/15/2016    Subjective:  Follow-up Follow-up on PsA arthritis and psoriasis  History of Present Illness: [name] is a 50 y.o. female who was last seen in our office in July 2017.  Patient is on monotherapy of methotrexate which he takes 5 pills on Wednesday and 5 pills on Sunday.  She is doing adequate with this medication but not as good as she likes because she has about 2-3 hours of morning stiffness and she feels like she can do better. Her ongoing pain as daily basis is as low as before but it can flare higher. She has learned to live with her pain and she tries not to think about it. Please see below list for full details on medicines that she is oriented use and failed or had a reaction to or cannot afford.  Failed  enbrel humira simponi  Reaction to remicade  Too costly stelara cimzia (we'll try to apply for this medication once again to see if patient can get patient assistance for this.)  Can't use b/c of Ulcerative Colitis cosentyx  We have discussed this problem with Dr. Elisabeth Most, clinical pharmacist, and we will try to see if we can help the patient get cimzia. In the meanwhile, mesalamine, ROWASA, is working well for the patient's U.C.   Activities of Daily Living:  Patient reports morning stiffness for 2-3 hours.   Patient Reports nocturnal pain.  Difficulty dressing/grooming: Reports Difficulty climbing stairs: Reports Difficulty getting out of chair: Denies Difficulty using hands for taps, buttons, cutlery, and/or writing: Reports   Review of Systems  Constitutional: Positive for fatigue (possibly doe to low vit d levels).  HENT: Negative for mouth sores and mouth dryness.   Eyes: Negative for dryness.  Respiratory: Negative for shortness  of breath.   Gastrointestinal: Negative for constipation and diarrhea.  Musculoskeletal: Negative for myalgias and myalgias.  Skin: Negative for sensitivity to sunlight.  Psychiatric/Behavioral: Negative for decreased concentration and sleep disturbance.    PMFS History:  Patient Active Problem List   Diagnosis Date Noted  . High risk medications (not anticoagulants) long-term use 05/13/2016  . Knee pain, bilateral 05/13/2016  . Anserine bursitis 05/13/2016  . 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 Chondromalacia patellae, right knee 05/13/2016  . Psoriatic arthritis ([place]) 05/12/2016  . UNSPECIFIED HYPOTHYROIDISM 08/24/2009  . HYPERLIPIDEMIA 08/24/2009  . HYPERTENSION 08/24/2009  . GERD 08/24/2009  . Ulcerative colitis ([place]) 08/24/2009  . IRRITABLE BOWEL SYNDROME 08/24/2009  . OVARIAN CYST 08/24/2009  . PSORIASIS 08/24/2009  . ARTHRITIS 08/24/2009  . TACHYCARDIA, HX OF 08/24/2009  . PANCREATITIS, HX OF 08/24/2009  . FIBROCYSTIC BREAST DISEASE, HX OF 08/24/2009    Past Medical History:  Diagnosis Date  . Arthritis   . Fibrocystic breast disease   . GERD (gastroesophageal reflux disease)   . Hyperlipidemia   . Hypertension   . Hypothyroidism   . Irritable bowel syndrome   . Ovarian cyst   . Pancreatitis   . Psoriasis   . Psoriatic arthritis ([place]) 05/12/2016   Poor response to Humira Inadequate response to Enbrel Inadequate response to Simponi  . Tachycardia   . Ulcerative colitis  Family History  Problem Relation Age of Onset  . Breast cancer Mother   . Heart disease Mother   . Colon polyps Mother   . Other Mother     pre-cancerous tumor removed  . Heart disease Father   . Crohn's disease Sister   . Irritable bowel syndrome    . Colon cancer Neg Hx    Past Surgical History:  Procedure Laterality Date  . ANKLE RECONSTRUCTION     left  . bladder tuck    . COLONOSCOPY    . KNEE SURGERY     left  . NASAL SEPTUM SURGERY    . NISSEN  FUNDOPLICATION    . PARTIAL HYSTERECTOMY    . repair cystocele and rectocele    . thyroid ablation    . TUBAL LIGATION     Social History   Social History Narrative  . No narrative on file     Objective: Vital Signs: BP 123/75 (BP Location: Left Arm, Patient Position: Sitting, Cuff Size: Large)   Pulse 84   Resp 12   Ht 5' 6"  (1.676 m)   Wt 206 lb (93.4 kg)   BMI 33.25 kg/m    Physical Exam  Constitutional: She is oriented to person, place, and time. She appears well-developed and well-nourished.  HENT:  Head: Normocephalic and atraumatic.  Eyes: EOM are normal. Pupils are equal, round, and reactive to light.  Cardiovascular: Normal rate, regular rhythm and normal heart sounds.  Exam reveals no gallop and no friction rub.   No murmur heard. Pulmonary/Chest: Effort normal and breath sounds normal. She has no wheezes. She has no rales.  Abdominal: Soft. Bowel sounds are normal. She exhibits no distension. There is no tenderness. There is no guarding. No hernia.  Musculoskeletal: Normal range of motion. She exhibits no edema, tenderness or deformity.  Lymphadenopathy:    She has no cervical adenopathy.  Neurological: She is alert and oriented to person, place, and time. Coordination normal.  Skin: Skin is warm and dry. Capillary refill takes less than 2 seconds. No rash noted.  Intensive psoriasis to the soles of the feet bilaterally. No relief with clobetasol cream  1 small lesion to the outside of the umbilical area consistent with psoriasis  Psychiatric: She has a normal mood and affect. Her behavior is normal.  Nursing note and vitals reviewed.    Musculoskeletal Exam:  full rom of all joints Grip strength is equal and strong bilaterally Fibromyalgia tender points are all absent  CDAI Exam: CDAI Homunculus Exam:   Tenderness:  RUE: wrist LUE: wrist Right hand: 1st MCP, 2nd MCP, 3rd MCP, 4th MCP, 5th MCP and 1st PIP Left hand: 1st MCP, 2nd MCP, 3rd MCP, 4th  MCP, 5th MCP and 1st PIP RLE: tibiofemoral LLE: tibiofemoral  Swelling:  Right hand: 2nd MCP Left hand: 2nd MCP and 3rd MCP  Joint Counts:  CDAI Tender Joint count: 16 CDAI Swollen Joint count: 3  Global Assessments:  Patient Global Assessment: 4 Provider Global Assessment: 4  CDAI Calculated Score: 27    Investigation: Findings:     Standing Labs reviewed from July 2017 -- wnl  Patient is on methotrexate 5 on Wednesday and 5 pills on Sunday Tolerating it well Patient has 2-3 hours of stiffness and desires to restart Biologics if possible. Please see above list for details.    Imaging: Xr Foot 2 Views Left  Result Date: 05/15/2016 Left foot DIP PIP joint space narrowing All MCPs are intact and normal  Mild intertarsal joint space narrowing No calcaneal spurs No erosions  Xr Foot 2 Views Right  Result Date: 05/15/2016 Right foot DIP PIP joint space narrowing All MCPs are intact and normal Mild intertarsal joint space narrowing No calcaneal spurs No erosions  Xr Hand 2 View Left  Result Date: 05/15/2016 X-ray left hand DIP PIP prominence bilaterally to all joints Mild MCP narrowing of first second third fourth and fifth digit Moderate radiocarpal joint space narrowing No ulnar styloid erosion Intercarpal joint space are intact No erosions  Xr Hand 2 View Right  Result Date: 05/15/2016 X-ray right hand DIP PIP prominence bilaterally to all joints Mild MCP narrowing of second third fourth and fifth digit Moderate MCP narrowing of first digit Moderate radiocarpal joint space narrowing No ulnar styloid erosion Intercarpal joint space are intact No erosions  We have ordered x-rays today of bilateral hands and bilateral feet. Last x-rays were done about October 2016 In July 2017 we did x-rays of bilateral knees. Speciality Comments: No specialty comments available.    Procedures:  No procedures performed Allergies: Doxycycline; Imuran [azathioprine]; Meperidine  hcl; Remicade [infliximab]; Sulfa antibiotics; Sulfa drugs cross reactors; Avelox [moxifloxacin]; Erythromycin; and Latex   Assessment / Plan: Visit Diagnoses: Psoriatic arthropathy ([place]) - Plan: XR Hand 2 View Right, XR Hand 2 View Left, XR Foot 2 Views Right, XR Foot 2 Views Left  High risk medications (not anticoagulants) long-term use - Plan: CBC with Differential/Platelet, COMPLETE METABOLIC PANEL WITH GFR, XR Chest 2 View, CBC with Differential/Platelet, COMPLETE METABOLIC PANEL WITH GFR  Other psoriasis  Pain in both knees, unspecified chronicity  Anserine bursitis  Chondromalacia patellae, left knee  Chondromalacia patellae, right knee  Other fatigue - Plan: VITAMIN D 25 Hydroxy (Vit-D Deficiency, Fractures)  Ulcerative colitis with complication, unspecified location [facility])   Patient is doing well with assorted arthropathy except she rates her discomfort on hemostasis for but he can flare higher.  The monotherapy of methotrexate 5 pills on Wednesday 5 pills on Sunday air working well for her but are not adequately controlling her symptoms. We feel that methotrexate injectable may work better for the patient and patient is agreeable to try the injectable. We will train her on how to do this and see if she can get some improvement while we wait for the approval of cimzia  Apply for  cimzia  Psoriasis is well controlled except she has a lesion to the umbilicus. She has extensive lesions to soles of bilateral feet. She is not getting any relief despite using clobetasol cream. A biologic for the psoriasis would be most helpful. We will make all attempts to get the biologic for the patient through patient assistance.    Ongoing knee joint pain.  Bilateral hands and MCP are painful Some pain to first PIP bilaterally Some discomfort to bilateral wrist off and on. In my estimation all of the above is related to her surgical arthritis being moderately controlled.   I advised  patient to decrease omeprazole or stop it altogether while on methotrexate. If her physician is agreeable to continue his Zantac instead of omeprazole. This will prevent the interaction of omeprazole and methotrexate (decrease elimination of methotrexate with omeprazole risk)  X-rays were reviewed of bilateral hands and feet and showed no erosions. Please see report for full details. Note that methotrexate is not adequately addressing her psoriatic arthritis but no erosions of habit despite being on monotherapy for the last 1 year   ===================================== Addendum 06/20/2016  We applied for Cimzia for this patient but it was denied.  Patient wishes to try Stelara. Overall the patient states to Dr. Koleen Nimrod did Stelara were pretty well and she is happy to try.  Dr. Koleen Nimrod suggested that perhaps Orencia O tesla might be an option for the patient in the future. I am agreeable.  Note the patient has failed other medications in the past or has found them to be too expensive. Please see the last office visit note for full details.            ====================================  Orders:   Orders Placed This Encounter  Procedures  . XR Hand 2 View Right  . XR Hand 2 View Left  . XR Foot 2 Views Right  . XR Foot 2 Views Left  . XR Chest 2 View  . CBC with Differential/Platelet  . COMPLETE METABOLIC PANEL WITH GFR  . VITAMIN D 25 Hydroxy (Vit-D Deficiency, Fractures)   Meds ordered this encounter  Medications  . methotrexate 250 MG/10ML injection    Sig: Pt will take 64m/ml of methotrexate subcutaneous every week.; Disp: 121mmultidose vial w/ preservatives.;    Dispense:  10 mL    Refill:  0    Face-to-face time spent with patient was 4026mtes. 50% of time was spent in counseling and coordination of care.  Follow-Up Instructions: Return in about 3 months (around 08/15/2016) for PsA, applied for cimzia oct '17 to add to mtx since pt only  moderately controlled.  I examined and evaluated the patient with NaiEliezer Lofts. The plan of care was discussed as noted above.  ShaBo MerinoD

## 2016-05-15 ENCOUNTER — Ambulatory Visit (INDEPENDENT_AMBULATORY_CARE_PROVIDER_SITE_OTHER): Payer: Medicare Other

## 2016-05-15 ENCOUNTER — Ambulatory Visit (INDEPENDENT_AMBULATORY_CARE_PROVIDER_SITE_OTHER): Payer: Medicare Other | Admitting: Rheumatology

## 2016-05-15 ENCOUNTER — Encounter: Payer: Self-pay | Admitting: Rheumatology

## 2016-05-15 VITALS — BP 123/75 | HR 84 | Resp 12 | Ht 66.0 in | Wt 206.0 lb

## 2016-05-15 DIAGNOSIS — K51919 Ulcerative colitis, unspecified with unspecified complications: Secondary | ICD-10-CM

## 2016-05-15 DIAGNOSIS — R5383 Other fatigue: Secondary | ICD-10-CM

## 2016-05-15 DIAGNOSIS — M7050 Other bursitis of knee, unspecified knee: Secondary | ICD-10-CM | POA: Diagnosis not present

## 2016-05-15 DIAGNOSIS — M25562 Pain in left knee: Secondary | ICD-10-CM | POA: Diagnosis not present

## 2016-05-15 DIAGNOSIS — M2241 Chondromalacia patellae, right knee: Secondary | ICD-10-CM

## 2016-05-15 DIAGNOSIS — M79671 Pain in right foot: Secondary | ICD-10-CM

## 2016-05-15 DIAGNOSIS — L4050 Arthropathic psoriasis, unspecified: Secondary | ICD-10-CM | POA: Diagnosis not present

## 2016-05-15 DIAGNOSIS — Z79899 Other long term (current) drug therapy: Secondary | ICD-10-CM | POA: Diagnosis not present

## 2016-05-15 DIAGNOSIS — M2242 Chondromalacia patellae, left knee: Secondary | ICD-10-CM | POA: Diagnosis not present

## 2016-05-15 DIAGNOSIS — L408 Other psoriasis: Secondary | ICD-10-CM

## 2016-05-15 DIAGNOSIS — M25542 Pain in joints of left hand: Secondary | ICD-10-CM | POA: Diagnosis not present

## 2016-05-15 DIAGNOSIS — M25561 Pain in right knee: Secondary | ICD-10-CM | POA: Diagnosis not present

## 2016-05-15 LAB — COMPLETE METABOLIC PANEL WITH GFR
ALT: 44 U/L — ABNORMAL HIGH (ref 6–29)
AST: 31 U/L (ref 10–35)
Albumin: 4 g/dL (ref 3.6–5.1)
Alkaline Phosphatase: 55 U/L (ref 33–130)
BUN: 16 mg/dL (ref 7–25)
CO2: 27 mmol/L (ref 20–31)
Calcium: 9.4 mg/dL (ref 8.6–10.4)
Chloride: 103 mmol/L (ref 98–110)
Creat: 0.89 mg/dL (ref 0.50–1.05)
GFR, Est African American: 87 mL/min (ref >=60)
GFR, Est Non African American: 76 mL/min (ref >=60)
Glucose, Bld: 94 mg/dL (ref 65–99)
Potassium: 4.6 mmol/L (ref 3.5–5.3)
Sodium: 139 mmol/L (ref 135–146)
Total Bilirubin: 0.3 mg/dL (ref 0.2–1.2)
Total Protein: 7.1 g/dL (ref 6.1–8.1)

## 2016-05-15 LAB — CBC WITH DIFFERENTIAL/PLATELET
Basophils Absolute: 0 cells/uL (ref 0–200)
Basophils Relative: 0 %
Eosinophils Absolute: 148 cells/uL (ref 15–500)
Eosinophils Relative: 2 %
HCT: 38.9 % (ref 35.0–45.0)
Hemoglobin: 13 g/dL (ref 11.7–15.5)
Lymphocytes Relative: 29 %
Lymphs Abs: 2146 cells/uL (ref 850–3900)
MCH: 33.4 pg — ABNORMAL HIGH (ref 27.0–33.0)
MCHC: 33.4 g/dL (ref 32.0–36.0)
MCV: 100 fL (ref 80.0–100.0)
MPV: 9.8 fL (ref 7.5–12.5)
Monocytes Absolute: 518 cells/uL (ref 200–950)
Monocytes Relative: 7 %
Neutro Abs: 4588 cells/uL (ref 1500–7800)
Neutrophils Relative %: 62 %
Platelets: 300 10*3/uL (ref 140–400)
RBC: 3.89 MIL/uL (ref 3.80–5.10)
RDW: 15.6 % — ABNORMAL HIGH (ref 11.0–15.0)
WBC: 7.4 10*3/uL (ref 3.8–10.8)

## 2016-05-15 MED ORDER — METHOTREXATE SODIUM CHEMO INJECTION 250 MG/10ML: INTRAMUSCULAR | 0 refills | Status: DC

## 2016-05-15 NOTE — Progress Notes (Signed)
Pharmacy Note  S:  Patient presents today to the [facility] to see Mr. [name].  Patient reports difficulty affording biologic DMARDs.  Patient seen by pharmacist for discussion of Cimzia patient assistance program and for counseling on injectable methotrexate.    O: CBC    Component Value Date/Time   WBC 7.6 02/14/2016   WBC 6.2 07/22/2015 0953   RBC 4.11 07/22/2015 0953   HGB 13.5 02/14/2016   HCT 40 02/14/2016   PLT 284 02/14/2016   MCV 98.2 07/22/2015 0953   MCHC 33.4 07/22/2015 0953   RDW 12.6 07/22/2015 0953   LYMPHSABS 2.0 07/22/2015 0953   MONOABS 0.6 07/22/2015 0953   EOSABS 0.1 07/22/2015 0953   BASOSABS 0.0 07/22/2015 0953   CMP     Component Value Date/Time   NA 140 02/14/2016   K 4.2 02/14/2016   CL 103 07/22/2015 0953   CO2 27 07/22/2015 0953   GLUCOSE 96 07/22/2015 0953   BUN 16 02/14/2016   CREATININE 0.8 02/14/2016   CREATININE 0.75 07/22/2015 0953   CALCIUM 9.2 07/22/2015 0953   PROT 6.9 07/22/2015 0953   ALBUMIN 3.9 07/22/2015 0953   AST 21 02/14/2016   ALT 19 02/14/2016   ALKPHOS 52 07/22/2015 0953   BILITOT 0.5 07/22/2015 0953   GFRNONAA >60 01/10/2007 0320   GFRAA  01/10/2007 0320    >60        The eGFR has been calculated using the MDRD equation. This calculation has not been validated in all clinical   TB test: Patient with history of positive PPD.  She has yearly chest X-ray.  Most recent chest X-ray completed 02/14/16 and was normal.    Hepatitis: negative (10/2009)  A/P: 1.  Cimzia:  Assisted patient in completing Cimzia patient assistance program application.  Both patient and provider portion were completed.  Patient must obtain financial information for submission with application.  Will plan to submit application as soon as it is complete.    Counseled patient that Cimzia is a TNF blocking agent.  Counseled patient on purpose, proper use, and adverse effects of Cimzia including the most common adverse effects of  infections, headache, and injection site reactions.  Discussed that there is the possibility of an increased risk of malignancy.  Advised patient to see a dermatologist yearly to monitor for skin cancer.  Reviewed importance of laboratory monitoring while on Cimzia.  Counseled patient to avoid live vaccinations while on Cimzia.  Provided patient with medication education material and answered all questions.  Patient has previously provided written consent on Cimzia, and again provided verbal consent today.  Advised patient that she will need to schedule a nursing visit for the initial injection if the medication is approved through the patient assistance program.  2.  Methotrexate:  Patient is currently taking methotrexate 10 tablet/week and is being changed to methotrexate injections at this time by Mr. [name].  Patient was counseled on the purpose, proper use, and adverse effects of methotrexate.  Reviewed instructions with patient to inject methotrexate once a week with a dose of 1 mL.  Educated patient on how to use a vial and syringe and reviewed injection technique with patient.  Patient was able to demonstrate proper technique for injections using vial and syringe.  Reviewed importance of weekly dosing of methotrexate and daily dosing of folic acid with patient.  Provided patient on educational material regarding injection technique and storage of methotrexate.  Advised patient she will need standing labs (CMP, CBC)  two weeks after starting injectable methotrexate, and advised patient standing lab orders have been placed by Mr. [name].  Patient voiced understanding.    Elisabeth Most, Pharm.D., BCPS Clinical Pharmacist Pager: [phone] Phone: [phone] 05/15/2016 10:06 AM

## 2016-05-15 NOTE — Patient Instructions (Signed)
Standing Labs We placed an order today for your standing lab work.    Please come back and get your standing labs in 2 weeks then every 2 months  We have open lab Monday through Friday from 8:30-11:30 AM and 1-4 PM at the office of Dr. Tresa Moore, PA.   The office is located at [address] [place], [place], [place] [address] No appointment is necessary.   Labs are drawn by [facility].  You may receive a bill from [facility] for your lab work.     Certolizumab pegol injection What is this medicine? CERTOLIZUMAB (SER toe LIZ oo mab) is used to treat Crohn's disease, psoriatic arthritis, or rheumatoid arthritis. This medicine may be used for other purposes; ask your health care provider or pharmacist if you have questions. What should I tell my health care provider before I take this medicine? They need to know if you have any of these conditions: -diabetes -heart disease -hepatitis B or history of hepatitis B infection -immune system problems -infection or history of infections -low blood counts, like low white cell, platelet, or red cell counts -multiple sclerosis -recently received or scheduled to receive a vaccine -scheduled to have surgery -tuberculosis, a positive skin test for tuberculosis or have recently been in close contact with someone who has tuberculosis -an unusual or allergic reaction to certolizumab, other medicines, foods, dyes, or preservatives -pregnant or trying to get pregnant -breast-feeding How should I use this medicine? This medicine is for injection under the skin. It is usually given by a health care professional in a hospital or clinic setting. If you get this medicine at home, you will be taught how to prepare and give this medicine. Use exactly as directed. Take your medicine at regular intervals. Do not take your medicine more often than directed. It is important that you put your used needles and syringes in a special sharps  container. Do not put them in a trash can. If you do not have a sharps container, call your pharmacist or healthcare provider to get one. A special MedGuide will be given to you by the pharmacist with each prescription and refill. Be sure to read this information carefully each time. Talk to your pediatrician regarding the use of this medicine in children. Special care may be needed. Overdosage: If you think you have taken too much of this medicine contact a poison control center or emergency room at once. NOTE: This medicine is only for you. Do not share this medicine with others. What if I miss a dose? It is important not to miss your dose. Call your doctor or health care professional if you are unable to keep an appointment. If you give yourself the medicine and you miss a dose, take it as soon as you can. If it is almost time for your next dose, take only that dose. Do not take double or extra doses. What may interact with this medicine? Do not take this medicine with any of the following medications: -abatacept -adalimumab -anakinra -etanercept -infliximab -live virus vaccines -rilonacept This medicine may also interact with the following medications: -vaccines This list may not describe all possible interactions. Give your health care provider a list of all the medicines, herbs, non-prescription drugs, or dietary supplements you use. Also tell them if you smoke, drink alcohol, or use illegal drugs. Some items may interact with your medicine. What should I watch for while using this medicine? Visit your doctor or health care professional for regular checks  on your progress. Tell your doctor or healthcare professional if your symptoms do not start to get better or if they get worse. Your condition will be monitored carefully while you are receiving this medicine. You will be tested for tuberculosis (TB) before you start this medicine. If your doctor prescribes any medicine for TB, you should  start taking the TB medicine before starting this medicine. Make sure to finish the full course of TB medicine. Call your doctor or health care professional for advice if you get a fever, chills, sore throat, or other symptoms of an infection. Do not treat yourself. This medicine may decrease your body's ability to fight infection. Try to avoid being around people who are sick. Talk to your doctor about your risk of cancer. You may be more at risk for certain types of cancers if you take this medicine. What side effects may I notice from receiving this medicine? Side effects that you should report to your doctor or health care professional as soon as possible: -allergic reactions like skin rash, itching or hives, swelling of the face, lips, or tongue -breathing problems -changes in vision -chest pain or palpitations -fever or chills, sore throat -pain, tingling, numbness in the hands or feet -red, scaly patches or raised bumps on the skin -seizures -swelling of the ankles, feet, hands -swollen lymph nodes in the neck, underarm, or groin areas -unexplained weight loss -unusual bleeding or bruising -unusually weak or tired Side effects that usually do not require medical attention (report to your doctor or health care professional if they continue or are bothersome): -irritation at site where injected This list may not describe all possible side effects. Call your doctor for medical advice about side effects. You may report side effects to FDA at 1-800-FDA-1088. Where should I keep my medicine? Keep out of the reach of children. If you are using this medicine at home, you will be instructed on how to store this medicine. Throw away any unused medicine after the expiration date on the label. NOTE: This sheet is a summary. It may not cover all possible information. If you have questions about this medicine, talk to your doctor, pharmacist, or health care provider.    2016, Elsevier/Gold  Standard. (2012-04-17 10:31:30)

## 2016-05-16 ENCOUNTER — Telehealth: Payer: Self-pay | Admitting: Radiology

## 2016-05-16 DIAGNOSIS — E559 Vitamin D deficiency, unspecified: Secondary | ICD-10-CM

## 2016-05-16 LAB — VITAMIN D 25 HYDROXY (VIT D DEFICIENCY, FRACTURES): Vit D, 25-Hydroxy: 24 ng/mL — ABNORMAL LOW (ref 30–100)

## 2016-05-16 MED ORDER — VITAMIN D3 1.25 MG (50000 UT) PO CAPS: 50000.0000 [IU] | ORAL_CAPSULE | ORAL | 0 refills | Status: AC

## 2016-05-16 NOTE — Telephone Encounter (Signed)
I have advised patient of lab results / sent in vit D and I have put in order for the repeat lab

## 2016-05-16 NOTE — Progress Notes (Signed)
Tell pt: Cbc w/ diff is wnl  Cmp w/ gfr was canceled? Any idea why? If needs to be redone, pls schedule pt.

## 2016-05-16 NOTE — Telephone Encounter (Signed)
-----   Message from [place], [place] sent at 05/16/2016 12:41 AM EDT ----- Tell pt: Cbc w/ diff is wnl  Cmp w/ gfr was canceled? Any idea why? If needs to be redone, pls schedule pt.

## 2016-05-16 NOTE — Progress Notes (Signed)
I now see the labs. Cbc is wnl Cmp w/ gfr  shows elevated ALT at 44 and we can monitor on next labs.  Advise pt to avoid things that affect liver function (unnecessary use of tylenol, etoh, etc). No CHANGE IN TREATMENT AT THIS TIME.  Vit d is low and we need to tx w/ ==> Give this RX:  Vit d3, 50000 IU every week x 12 weeks;disp 12 w/ no refill.  Recheck vit d in 12 weeks.

## 2016-05-16 NOTE — Telephone Encounter (Signed)
-----   Message from Eliezer Lofts, [place] sent at 05/16/2016 11:16 AM EDT ----- I now see the labs. Cbc is wnl Cmp w/ gfr  shows elevated ALT at 44 and we can monitor on next labs.  Advise pt to avoid things that affect liver function (unnecessary use of tylenol, etoh, etc). No CHANGE IN TREATMENT AT THIS TIME.  Vit d is low and we need to tx w/ ==> Give this RX:  Vit d3, 50000 IU every week x 12 weeks;disp 12 w/ no refill.  Recheck vit d in 12 weeks.

## 2016-05-24 ENCOUNTER — Telehealth: Payer: Self-pay | Admitting: Pharmacist

## 2016-05-24 NOTE — Telephone Encounter (Signed)
Patient had appointment on 17/71/16 and application was started for the Cimzia patient assistance program.  Application is missing patient's financial information.  Patient was supposed to provide financial information in order for application to be submitted.  I called patient to follow up.  There was no answer.  I left a voicemail requesting patient return my phone call.

## 2016-05-25 NOTE — Telephone Encounter (Signed)
Received patent's income information and submitted the application for the Cimzia patient assistance program.  Will check on the status in a few weeks.

## 2016-06-01 ENCOUNTER — Other Ambulatory Visit: Payer: Self-pay | Admitting: Radiology

## 2016-06-01 DIAGNOSIS — Z79899 Other long term (current) drug therapy: Secondary | ICD-10-CM

## 2016-06-01 DIAGNOSIS — E559 Vitamin D deficiency, unspecified: Secondary | ICD-10-CM

## 2016-06-01 LAB — CBC WITH DIFFERENTIAL/PLATELET
Basophils Absolute: 0 cells/uL (ref 0–200)
Basophils Relative: 0 %
Eosinophils Absolute: 110 cells/uL (ref 15–500)
Eosinophils Relative: 2 %
HCT: 40.5 % (ref 35.0–45.0)
Hemoglobin: 13.2 g/dL (ref 11.7–15.5)
Lymphocytes Relative: 31 %
Lymphs Abs: 1705 cells/uL (ref 850–3900)
MCH: 32.9 pg (ref 27.0–33.0)
MCHC: 32.6 g/dL (ref 32.0–36.0)
MCV: 101 fL — ABNORMAL HIGH (ref 80.0–100.0)
MPV: 10 fL (ref 7.5–12.5)
Monocytes Absolute: 495 cells/uL (ref 200–950)
Monocytes Relative: 9 %
Neutro Abs: 3190 cells/uL (ref 1500–7800)
Neutrophils Relative %: 58 %
Platelets: 284 10*3/uL (ref 140–400)
RBC: 4.01 MIL/uL (ref 3.80–5.10)
RDW: 14.8 % (ref 11.0–15.0)
WBC: 5.5 10*3/uL (ref 3.8–10.8)

## 2016-06-01 LAB — COMPLETE METABOLIC PANEL WITH GFR
ALT: 18 U/L (ref 6–29)
AST: 21 U/L (ref 10–35)
Albumin: 4.1 g/dL (ref 3.6–5.1)
Alkaline Phosphatase: 54 U/L (ref 33–130)
BUN: 12 mg/dL (ref 7–25)
CO2: 28 mmol/L (ref 20–31)
Calcium: 9.6 mg/dL (ref 8.6–10.4)
Chloride: 101 mmol/L (ref 98–110)
Creat: 0.88 mg/dL (ref 0.50–1.05)
GFR, Est African American: 89 mL/min (ref >=60)
GFR, Est Non African American: 77 mL/min (ref >=60)
Glucose, Bld: 116 mg/dL — ABNORMAL HIGH (ref 65–99)
Potassium: 4.5 mmol/L (ref 3.5–5.3)
Sodium: 137 mmol/L (ref 135–146)
Total Bilirubin: 0.7 mg/dL (ref 0.2–1.2)
Total Protein: 7.3 g/dL (ref 6.1–8.1)

## 2016-06-01 NOTE — Addendum Note (Signed)
Addended byCandice Camp on: 06/01/2016 09:02 AM   Modules accepted: Orders

## 2016-06-02 ENCOUNTER — Other Ambulatory Visit: Payer: Self-pay | Admitting: Family Medicine

## 2016-06-02 DIAGNOSIS — Z1231 Encounter for screening mammogram for malignant neoplasm of breast: Secondary | ICD-10-CM

## 2016-06-02 NOTE — Progress Notes (Signed)
#  1) CMP with GFR is normal except for nonfasting glucose at 116.#2) CBC with differential is normal except for slight elevation of MCV at 101  These values are within normal limits and we can monitor. No change in treatment. Please send copy of this note to her PCP.

## 2016-06-11 ENCOUNTER — Ambulatory Visit (INDEPENDENT_AMBULATORY_CARE_PROVIDER_SITE_OTHER): Payer: Medicare Other

## 2016-06-11 ENCOUNTER — Ambulatory Visit (HOSPITAL_COMMUNITY)
Admission: EM | Admit: 2016-06-11 | Discharge: 2016-06-11 | Disposition: A | Discharge status: Home / Self Care | Payer: Medicare Other | Attending: Family Medicine | Admitting: Family Medicine

## 2016-06-11 ENCOUNTER — Encounter (HOSPITAL_COMMUNITY): Payer: Self-pay | Admitting: *Deleted

## 2016-06-11 DIAGNOSIS — W19XXXA Unspecified fall, initial encounter: Secondary | ICD-10-CM

## 2016-06-11 DIAGNOSIS — Y92009 Unspecified place in unspecified non-institutional (private) residence as the place of occurrence of the external cause: Secondary | ICD-10-CM

## 2016-06-11 DIAGNOSIS — M25552 Pain in left hip: Secondary | ICD-10-CM

## 2016-06-11 DIAGNOSIS — Y92099 Unspecified place in other non-institutional residence as the place of occurrence of the external cause: Secondary | ICD-10-CM | POA: Diagnosis not present

## 2016-06-11 DIAGNOSIS — S7002XA Contusion of left hip, initial encounter: Secondary | ICD-10-CM

## 2016-06-11 MED ORDER — HYDROCODONE-ACETAMINOPHEN 5-325 MG PO TABS: 1.0000 | ORAL_TABLET | Freq: Four times a day (QID) | ORAL | 0 refills | Status: DC | PRN

## 2016-06-11 NOTE — ED Triage Notes (Signed)
Pt  Fell  yest     Going  Down  Some  Stairs         She  Has  Pain on  Movement  And  On  Weight  Bearing        She     Ambulated  To room    She has  Some  Bruising on her l  Arm     From the  Fall

## 2016-06-11 NOTE — Discharge Instructions (Signed)
X-rays are negative for fracture.  Apply ice to the hip tonight.  Expect gradual resolution over a week.

## 2016-06-11 NOTE — ED Provider Notes (Signed)
[facility]    CSN: [id number] Arrival date & time: 06/11/16  1427     History   Chief Complaint Chief Complaint  Patient presents with  . Fall    HPI [name] is a 50 y.o. female.   This a 50 year old woman who fell on the steps yesterday and now complains of left buttock pain and left groin pain. The pain is worse with weightbearing.  Patient also bruised her left forearm but that is not hurting terribly.  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      Past Medical History:  Diagnosis Date  . Arthritis   . Fibrocystic breast disease   . GERD (gastroesophageal reflux disease)   . Hyperlipidemia   . Hypertension   . Hypothyroidism   . Irritable bowel syndrome   . Ovarian cyst   . Pancreatitis   . Psoriasis   . Psoriatic arthritis ([place]) 05/12/2016   Poor response to Humira Inadequate response to Enbrel Inadequate response to Simponi  . Tachycardia   . Ulcerative colitis     Patient Active Problem List   Diagnosis Date Noted  . High risk medications (not anticoagulants) long-term use 05/13/2016  . Knee pain, bilateral 05/13/2016  . Anserine bursitis 05/13/2016  . 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 Chondromalacia patellae, right knee 05/13/2016  . Psoriatic arthritis ([place]) 05/12/2016  . UNSPECIFIED HYPOTHYROIDISM 08/24/2009  . HYPERLIPIDEMIA 08/24/2009  . HYPERTENSION 08/24/2009  . GERD 08/24/2009  . Ulcerative colitis ([place]) 08/24/2009  . IRRITABLE BOWEL SYNDROME 08/24/2009  . OVARIAN CYST 08/24/2009  . PSORIASIS 08/24/2009  . ARTHRITIS 08/24/2009  . TACHYCARDIA, HX OF 08/24/2009  . PANCREATITIS, HX OF 08/24/2009  . FIBROCYSTIC BREAST DISEASE, HX OF 08/24/2009    Past Surgical History:  Procedure Laterality Date  . ANKLE RECONSTRUCTION     left  . bladder tuck    . COLONOSCOPY    . KNEE SURGERY     left  . NASAL SEPTUM SURGERY    . NISSEN FUNDOPLICATION    . PARTIAL HYSTERECTOMY      . repair cystocele and rectocele    . thyroid ablation    . TUBAL LIGATION      OB History    No data available       Home Medications    Prior to Admission medications   Medication Sig Start Date End Date Taking? Authorizing Provider  Calcium Carbonate-Vit D-Min (CALCIUM 1200 PO) Take 1 tablet by mouth daily.    Historical Provider, MD  Cholecalciferol (VITAMIN D3) 50000 units CAPS Take 50,000 Units by mouth once a week. 05/16/16 08/14/16  Naitik Panwala, PA-C  clonazePAM (KLONOPIN) 1 MG tablet Take 1 mg by mouth as needed for anxiety.    Historical Provider, MD  estradiol (ESTRACE) 1 MG tablet Take 1 mg by mouth daily.    Historical Provider, MD  folic acid (FOLVITE) 1 MG tablet Take 2 mg by mouth daily.    Historical Provider, MD  HYDROcodone-acetaminophen (NORCO) 5-325 MG tablet Take 1 tablet by mouth every 6 (six) hours as needed for moderate pain. 06/11/16   Robyn Haber, MD  ibuprofen (ADVIL,MOTRIN) 200 MG tablet Take 200 mg by mouth every 6 (six) hours as needed.    Historical Provider, MD  levothyroxine (SYNTHROID, LEVOTHROID) 125 MCG tablet Take 125 mcg by mouth daily.    Historical Provider, MD  methotrexate 250 MG/10ML injection Pt will take 74m/ml of methotrexate subcutaneous every week.; Disp:  22m multidose vial w/ preservatives.; 05/15/16   Naitik Panwala, PA-C  Multiple Vitamins-Minerals (CENTRUM SILVER ULTRA WOMENS PO) Take 1 tablet by mouth daily.    Historical Provider, MD  omeprazole (PRILOSEC) 20 MG capsule Take 20 mg by mouth daily. Reported on 07/27/2015    Historical Provider, MD  pravastatin (PRAVACHOL) 40 MG tablet Take 80 mg by mouth daily.     Historical Provider, MD  sertraline (ZOLOFT) 100 MG tablet Take 100 mg by mouth daily.    Historical Provider, MD  ustekinumab (STELARA) 45 MG/0.5ML SOSY injection Inject 45 mg into the skin every 3 (three) months.    Historical Provider, MD    Family History Family History  Problem Relation Age of Onset  .  Breast cancer Mother   . Heart disease Mother   . Colon polyps Mother   . Other Mother     pre-cancerous tumor removed  . Heart disease Father   . Crohn's disease Sister   . Irritable bowel syndrome    . Colon cancer Neg Hx     Social History Social History  Substance Use Topics  . Smoking status: Former Smoker    Quit date: 07/27/2005  . Smokeless tobacco: Never Used  . Alcohol use 0.0 oz/week     Comment: occasionaly     Allergies   Doxycycline; Imuran [azathioprine]; Meperidine hcl; Remicade [infliximab]; Sulfa antibiotics; Sulfa drugs cross reactors; Avelox [moxifloxacin]; Erythromycin; and Latex   Review of Systems Review of Systems  Constitutional: Negative.   HENT: Negative.   Eyes: Negative.   Respiratory: Negative.   Cardiovascular: Negative.   Gastrointestinal: Negative.   Musculoskeletal: Positive for back pain and gait problem.     Physical Exam Triage Vital Signs ED Triage Vitals  Enc Vitals Group     BP 06/11/16 1556 155/96     Pulse Rate 06/11/16 1556 92     Resp 06/11/16 1556 14     Temp 06/11/16 1556 98 F (36.7 C)     Temp Source 06/11/16 1556 Oral     SpO2 06/11/16 1556 98 %     Weight --      Height --      Head Circumference --      Peak Flow --      Pain Score 06/11/16 1605 6     Pain Loc --      Pain Edu? --      [place] --    No data found.   Updated Vital Signs BP 155/96   Pulse 92   Temp 98 F (36.7 C) (Oral)   Resp 14   SpO2 98%      Physical Exam  Constitutional: She is oriented to person, place, and time. She appears well-developed and well-nourished.  HENT:  Head: Normocephalic.  Right Ear: External ear normal.  Left Ear: External ear normal.  Mouth/Throat: Oropharynx is clear and moist.  Eyes: Conjunctivae and EOM are normal.  Neck: Normal range of motion. Neck supple.  Pulmonary/Chest: Effort normal.  Musculoskeletal: She exhibits tenderness. She exhibits no deformity.  Tender left buttock area  without ecchymosis. Patient moving her hip normally while lying on her right side.  Neurological: She is alert and oriented to person, place, and time.  Skin: Skin is warm and dry.  Ecchymosis mid left forearm with no significant swelling No ecchymosis over her hip  Nursing note and vitals reviewed.    UC Treatments / Results  Labs (all labs ordered are listed,  but only abnormal results are displayed) Labs Reviewed - No data to display  EKG  EKG Interpretation None       Radiology Negative left hip and pelvis Procedures Procedures (including critical care time)  Medications Ordered in UC Medications - No data to display   Initial Impression / Assessment and Plan / UC Course  I have reviewed the triage vital signs and the nursing notes.  Pertinent labs & imaging results that were available during my care of the patient were reviewed by me and considered in my medical decision making (see chart for details).  Clinical Course      Final Clinical Impressions(s) / UC Diagnoses   Final diagnoses:  Contusion of left hip, initial encounter  Fall in home, initial encounter    New Prescriptions New Prescriptions   HYDROCODONE-ACETAMINOPHEN (NORCO) 5-325 MG TABLET    Take 1 tablet by mouth every 6 (six) hours as needed for moderate pain.     Robyn Haber, MD 06/11/16 [phone]

## 2016-06-16 ENCOUNTER — Telehealth: Payer: Self-pay | Admitting: Pharmacist

## 2016-06-16 NOTE — Telephone Encounter (Signed)
Called Cimzia patient assistance program to follow up on patient's application.  I was informed that patient was denied since patient has Medicare Part D insurance she does not qualify.  I called patient to let her know.  I left voicemail requesting patient return my call.    Elisabeth Most, Pharm.D., BCPS Clinical Pharmacist Pager: [phone] Phone: [phone] 06/16/2016 5:04 PM

## 2016-06-19 NOTE — Telephone Encounter (Signed)
Okay to give her Stelara patient assistance program

## 2016-06-19 NOTE — Telephone Encounter (Signed)
Called patient and advised her that the Cimzia patient assistance application was denied.  Patient voiced understanding and reports she may be interested in going back on Stelara and applying for the Stelara patient assistance program.    Mr. [name], do you want me to send her the application for Stelara patient assistance program?     Elisabeth Most, Pharm.D., BCPS Clinical Pharmacist Pager: [phone] Phone: [phone] 06/19/2016 12:33 PM

## 2016-06-20 NOTE — Telephone Encounter (Addendum)
I called patient and updated her that Mr. [name] is okay with her applying for Stelara patient assistance program.  Patient was previously on Stelara in the past and signed consent for Stelara in March 2015.  Will upload to her chart.  Upon reviewing patient's chart, I noted that patient applied for Stelara patient assistance in November 2016 and did not qualify.  I discussed this with patient and she said that since she has new insurance for 2018 she wants to try to apply again for 2018.  I will mail patient the patient assistance program application.  Patient does not plan to submit the Stelara application until 0938. Will follow up with patient regarding application in January 1829.     Elisabeth Most, Pharm.D., BCPS Clinical Pharmacist Pager: [phone] Phone: [phone] 06/20/2016 9:02 AM

## 2016-06-20 NOTE — Progress Notes (Signed)
  We applied for Cimzia for this patient but it was denied.  Patient wishes to try Stelara. Overall the patient states to Dr. Koleen Nimrod did Stelara were pretty well and she is happy to try.  Dr. Koleen Nimrod suggested that perhaps Orencia O tesla might be an option for the patient in the future. I am agreeable.  Note the patient has failed other medications in the past or has found them to be too expensive. Please see the last office visit note for full details.

## 2016-07-11 ENCOUNTER — Ambulatory Visit: Payer: Self-pay

## 2016-07-19 ENCOUNTER — Telehealth: Payer: Self-pay | Admitting: Pharmacist

## 2016-07-19 NOTE — Telephone Encounter (Signed)
Called patient to discuss Stelara patient assistance program.  Patient reports she is still interested in applying.  She confirms she received the application in the mail.  She has not completed it yet but plans to this week.  Advised patient to bring completed application along with required documentation to our office.  Will plan to submit patient and provider portion together.    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  Most recent chest X-ray was on 02/14/16: "IMPRESSION: Normal and stable chest x-ray.   Mr. [name], okay to apply for Stelara patient assistance program?  I placed application on your desk.

## 2016-07-19 NOTE — Telephone Encounter (Signed)
Received provider portion.  Will submit provider portion with patient portion once patient brings in her portion of the application.

## 2016-07-19 NOTE — Telephone Encounter (Signed)
I signed the form for patient's Stelara assistance program

## 2016-07-20 ENCOUNTER — Telehealth: Payer: Self-pay | Admitting: Pharmacist

## 2016-07-20 NOTE — Telephone Encounter (Signed)
Received a fax from patient with her portion of the patient assistance application.  Will submit full application to the Stelara patient assistance program.  Called patient to update her.     Elisabeth Most, Pharm.D., BCPS Clinical Pharmacist Pager: [phone] Phone: [phone] 07/20/2016 4:06 PM

## 2016-07-30 ENCOUNTER — Other Ambulatory Visit: Payer: Self-pay | Admitting: Rheumatology

## 2016-07-31 ENCOUNTER — Telehealth: Payer: Self-pay

## 2016-07-31 NOTE — Telephone Encounter (Addendum)
Entered in error  [place], [employer], CPhT

## 2016-07-31 NOTE — Telephone Encounter (Signed)
Called to check the status of patients application for assistance for Stelara. Becky Sax states that the patient has been denied because she has Medicare PartD. In order for her to be approved, patient needs to show that she as spent 4 percent of out-of-pocket expenses for the medication. This documentation can be faxed to [phone] with a cover letter with her name, record number and a statement about being re-evaluated for assistance.   Record Number: [id number]  Lyne Khurana, Sharyn Blitz, CPhT

## 2016-08-01 ENCOUNTER — Telehealth: Payer: Self-pay | Admitting: *Deleted

## 2016-08-01 NOTE — Telephone Encounter (Signed)
Patient called to speak to Dr. Koleen Nimrod about [name]. Please call patient back [phone]

## 2016-08-01 NOTE — Telephone Encounter (Signed)
Called patient to inform her that she did not qualify for the Stelara patient assistance program at this time.  Patient has appointment on 08/14/16 to follow up and discuss treatment options.   Dr. Estanislado Pandy, sending to you as FYI.  Patient has psoriasis and psoriatic arthritis.  She is currently on methotrexate monotherapy.  She has medicare and has difficulty affording biologics.  Patient has applied for and been denied Cimzia and Stelara patient assistance programs.  Patient has failed Enbrel, Humira, and Simponi.  She had a reaction to Remicade.  She is unable to use Cosentyx due to Ulcerative Colitis.  I am thinking we could consider applying for [facility] patient assistance program at patient's follow up visit if indicated.

## 2016-08-01 NOTE — Telephone Encounter (Signed)
Patient advised we had received a refill request for Vitamin D. Patient advised we would need to check her Vitamin D level before sending in her prescription. Patient states she has an appointment Monday and will have it done then.

## 2016-08-01 NOTE — Telephone Encounter (Signed)
Patient reports she spoke to her insurance company regarding Stelara and they are going to fax over a questionnaire to determine if patient would be able to get Stelara through her part B insurance.  Advised patient we will work on the fax once we receive it from [place].    Elisabeth Most, Pharm.D., BCPS, CPP Clinical Pharmacist Pager: [phone] Phone: [phone] 08/01/2016 4:05 PM

## 2016-08-04 ENCOUNTER — Telehealth: Payer: Self-pay | Admitting: Pharmacist

## 2016-08-04 NOTE — Telephone Encounter (Signed)
Received fax from [employer] regarding prior authorization request form for Stelara.  I completed form and faxed to [phone].  I called patient to inform her.   Elisabeth Most, Pharm.D., BCPS, CPP Clinical Pharmacist Pager: [phone] Phone: [phone] 08/04/2016 10:31 AM

## 2016-08-07 ENCOUNTER — Ambulatory Visit
Admission: RE | Admit: 2016-08-07 | Discharge: 2016-08-07 | Disposition: A | Discharge status: Home / Self Care | Payer: Medicare HMO | Source: Ambulatory Visit | Attending: Family Medicine | Admitting: Family Medicine

## 2016-08-07 DIAGNOSIS — Z1231 Encounter for screening mammogram for malignant neoplasm of breast: Secondary | ICD-10-CM

## 2016-08-11 DIAGNOSIS — Z8719 Personal history of other diseases of the digestive system: Secondary | ICD-10-CM | POA: Insufficient documentation

## 2016-08-11 DIAGNOSIS — M170 Bilateral primary osteoarthritis of knee: Secondary | ICD-10-CM | POA: Insufficient documentation

## 2016-08-11 DIAGNOSIS — Z8679 Personal history of other diseases of the circulatory system: Secondary | ICD-10-CM | POA: Insufficient documentation

## 2016-08-11 DIAGNOSIS — R7611 Nonspecific reaction to tuberculin skin test without active tuberculosis: Secondary | ICD-10-CM | POA: Insufficient documentation

## 2016-08-11 DIAGNOSIS — M47816 Spondylosis without myelopathy or radiculopathy, lumbar region: Secondary | ICD-10-CM | POA: Insufficient documentation

## 2016-08-11 NOTE — Progress Notes (Signed)
Office Visit Note  Patient: [name]             Date of Birth: [birth year]           MRN: [id number]             PCP: Mayra Neer, MD Referring: Mayra Neer, MD Visit Date: 08/14/2016 Occupation: @GUAROCC @    Subjective:  Pain hands   History of Present Illness: [name] is a 51 y.o. female  history of psoriasis and psoriatic arthritis. She states she's been having pain and discomfort in her hands feet shoulders and knees. She's been also noticing some intermittent swelling in her hands and left knee joint. Her psoriasis is improved some since she's been using subcutaneous methotrexate. She also complains of some pain in her left trochanteric bursa area which causes nocturnal pain as well.  Activities of Daily Living:  Patient reports morning stiffness for 90 minutes.   Patient Reports nocturnal pain.  Difficulty dressing/grooming: Denies Difficulty climbing stairs: Reports Difficulty getting out of chair: Denies Difficulty using hands for taps, buttons, cutlery, and/or writing: Denies   Review of Systems  Constitutional: Negative for fatigue, night sweats, weight gain, weight loss and weakness.  HENT: Negative for mouth sores, trouble swallowing, trouble swallowing, mouth dryness and nose dryness.   Eyes: Negative for pain, redness, visual disturbance and dryness.  Respiratory: Negative for cough, shortness of breath and difficulty breathing.   Cardiovascular: Negative for chest pain, palpitations, hypertension, irregular heartbeat and swelling in legs/feet.  Gastrointestinal: Negative for blood in stool, constipation and diarrhea.  Endocrine: Negative for increased urination.  Genitourinary: Negative for vaginal dryness.  Musculoskeletal: Positive for arthralgias, joint pain, joint swelling and morning stiffness. Negative for myalgias, muscle weakness, muscle tenderness and myalgias.  Skin: Positive for rash. Negative for color change, hair loss, skin  tightness, ulcers and sensitivity to sunlight.       Auriasis patches on feet, abdomen and behind knees  Allergic/Immunologic: Negative for susceptible to infections.  Neurological: Negative for dizziness, memory loss and night sweats.  Hematological: Negative for swollen glands.  Psychiatric/Behavioral: Positive for depressed mood and sleep disturbance. The patient is not nervous/anxious.     PMFS History:  Patient Active Problem List   Diagnosis Date Noted  . 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 Primary osteoarthritis of both knees 08/11/2016  . History of hypertension 08/11/2016  . History of ulcerative colitis 08/11/2016  . History of IBS 08/11/2016  . 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 High risk medications (not anticoagulants) long-term use 05/13/2016  . Knee pain, bilateral 05/13/2016  . Anserine bursitis 05/13/2016  . 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 Chondromalacia patellae, right knee 05/13/2016  . Psoriatic arthritis ([place]) 05/12/2016  . UNSPECIFIED HYPOTHYROIDISM 08/24/2009  . HYPERLIPIDEMIA 08/24/2009  . Essential hypertension 08/24/2009  . GERD 08/24/2009  . Ulcerative colitis ([place]) 08/24/2009  . IRRITABLE BOWEL SYNDROME 08/24/2009  . OVARIAN CYST 08/24/2009  . PSORIASIS 08/24/2009  . ARTHRITIS 08/24/2009  . TACHYCARDIA, HX OF 08/24/2009  . PANCREATITIS, HX OF 08/24/2009  . FIBROCYSTIC BREAST DISEASE, HX OF 08/24/2009    Past Medical History:  Diagnosis Date  . Arthritis   . Fibrocystic breast disease   . GERD (gastroesophageal reflux disease)   . Hyperlipidemia   . Hypertension   . Hypothyroidism   . Irritable bowel syndrome   . Ovarian cyst   . Pancreatitis   . Psoriasis   . Psoriatic arthritis ([place]) 05/12/2016  Poor response to Humira Inadequate response to Enbrel Inadequate response to Simponi  . Tachycardia   . Ulcerative colitis     Family History  Problem Relation Age of Onset  . Breast  cancer Mother   . Heart disease Mother   . Colon polyps Mother   . Other Mother     pre-cancerous tumor removed  . Heart disease Father   . Crohn's disease Sister   . Irritable bowel syndrome    . Colon cancer Neg Hx    Past Surgical History:  Procedure Laterality Date  . ANKLE RECONSTRUCTION     left  . bladder tuck    . COLONOSCOPY    . KNEE SURGERY     left  . NASAL SEPTUM SURGERY    . NISSEN FUNDOPLICATION    . PARTIAL HYSTERECTOMY    . repair cystocele and rectocele    . thyroid ablation    . TUBAL LIGATION     Social History   Social History Narrative  . No narrative on file     Objective: Vital Signs: BP 120/74   Pulse 68   Resp 14   Ht 5' 6"  (1.676 m)   Wt 201 lb (91.2 kg)   BMI 32.44 kg/m    Physical Exam  Constitutional: She is oriented to person, place, and time. She appears well-developed and well-nourished.  HENT:  Head: Normocephalic and atraumatic.  Eyes: Conjunctivae and EOM are normal.  Neck: Normal range of motion.  Cardiovascular: Normal rate, regular rhythm, normal heart sounds and intact distal pulses.   Pulmonary/Chest: Effort normal and breath sounds normal.  Abdominal: Soft. Bowel sounds are normal.  Lymphadenopathy:    She has no cervical adenopathy.  Neurological: She is alert and oriented to person, place, and time.  Skin: Skin is warm and dry. Capillary refill takes less than 2 seconds. Rash noted.  Psoriasis patches on bilateral lower extremities behind her knees and her umbilical area  Psychiatric: She has a normal mood and affect. Her behavior is normal.  Nursing note and vitals reviewed.    Musculoskeletal Exam: C-spine and thoracic lumbar spine good range of motion. Shoulder joints although joints wrist joints MTPs PIPs good range of motion. She has thickening of PIP/DIP joints bilaterally consistent with osteoarthritis. She also had tenderness on some of her MCPs and PIPs as depicted below with no synovitis. She had  tenderness on palpation over left fascial bursa consistent with bursitis. And some tenderness over left trochanteric bursa consistent with trochanteric bursitis. CDAI Exam: CDAI Homunculus Exam:   Tenderness:  Right hand: 3rd MCP and 3rd PIP Left hand: 3rd PIP and 4th PIP LLE: tibiofemoral  Joint Counts:  CDAI Tender Joint count: 5 CDAI Swollen Joint count: 0  Global Assessments:  Patient Global Assessment: 6 Provider Global Assessment: 4  CDAI Calculated Score: 15    Investigation: Findings:  02/14/2016 X-rays of bilateral knees, 3 views, show moderate medial compartment narrowing bilaterally. No CPPD.  Bilateral mild patellar joint space narrowing.   04/19/2015 X-rays  of bilateral hands, 2 views and compared to March 2013, show mild DIP and PIP joint space narrowing.  No intercarpal joint space narrowing.  No ulnar styloid joint space narrowing.  No erosions.  Hand x-rays are consistent with the ones done in 2013.   X-rays of bilateral feet, 2 views, versus March 2013, show DIP and PIP joint space narrowing bilaterally.  Bilateral MTPs are normal.  Bilateral intertarsal joint spaces normal.  No erosions.  No change versus 2013.  L-spine x-ray compared to 2013 show superior SI joint with joint space narrowing.  Otherwise, facet joint arthropathy.  No change versus March 2013.    Labs from April of 2011, sed rate, hepatitis panel, C3, C4, UA, anti-Cardiolipin, and ENA were negative.  Her ANA was positive at 1:640 nucleolar pattern.    REMICADE she had reaction, HUMIRA and SIMPONI she had inadequate response    No visits with results within 2 Month(s) from this visit.  Latest known visit with results is:  Orders Only on 06/01/2016  Component Date Value Ref Range Status  . WBC 06/01/2016 5.5  3.8 - 10.8 K/uL Final  . RBC 06/01/2016 4.01  3.80 - 5.10 MIL/uL Final  . Hemoglobin 06/01/2016 13.2  11.7 - 15.5 g/dL Final  . HCT 06/01/2016 40.5  35.0 - 45.0 % Final  . MCV 06/01/2016  101.0* 80.0 - 100.0 fL Final  . MCH 06/01/2016 32.9  27.0 - 33.0 pg Final  . MCHC 06/01/2016 32.6  32.0 - 36.0 g/dL Final  . RDW 06/01/2016 14.8  11.0 - 15.0 % Final  . Platelets 06/01/2016 284  140 - 400 K/uL Final  . MPV 06/01/2016 10.0  7.5 - 12.5 fL Final  . Neutro Abs 06/01/2016 3190  1,500 - 7,800 cells/uL Final  . Lymphs Abs 06/01/2016 1705  850 - 3,900 cells/uL Final  . Monocytes Absolute 06/01/2016 495  200 - 950 cells/uL Final  . Eosinophils Absolute 06/01/2016 110  15 - 500 cells/uL Final  . Basophils Absolute 06/01/2016 0  0 - 200 cells/uL Final  . Neutrophils Relative % 06/01/2016 58  % Final  . Lymphocytes Relative 06/01/2016 31  % Final  . Monocytes Relative 06/01/2016 9  % Final  . Eosinophils Relative 06/01/2016 2  % Final  . Basophils Relative 06/01/2016 0  % Final  . Smear Review 06/01/2016 Criteria for review not met   Final  . Sodium 06/01/2016 137  135 - 146 mmol/L Final  . Potassium 06/01/2016 4.5  3.5 - 5.3 mmol/L Final  . Chloride 06/01/2016 101  98 - 110 mmol/L Final  . CO2 06/01/2016 28  20 - 31 mmol/L Final  . Glucose, Bld 06/01/2016 116* 65 - 99 mg/dL Final  . BUN 06/01/2016 12  7 - 25 mg/dL Final  . Creat 06/01/2016 0.88  0.50 - 1.05 mg/dL Final   Comment:   For patients > or = 51 years of age: The upper reference limit for Creatinine is approximately 13% higher for people identified as African-American.     . Total Bilirubin 06/01/2016 0.7  0.2 - 1.2 mg/dL Final  . Alkaline Phosphatase 06/01/2016 54  33 - 130 U/L Final  . AST 06/01/2016 21  10 - 35 U/L Final  . ALT 06/01/2016 18  6 - 29 U/L Final  . Total Protein 06/01/2016 7.3  6.1 - 8.1 g/dL Final  . Albumin 06/01/2016 4.1  3.6 - 5.1 g/dL Final  . Calcium 06/01/2016 9.6  8.6 - 10.4 mg/dL Final  . GFR, Est African American 06/01/2016 89  >=60 mL/min Final  . GFR, Est Non African American 06/01/2016 77  >=60 mL/min Final    Imaging: Mm Screening Breast Tomo Bilateral  Result Date:  08/08/2016 CLINICAL DATA:  Screening. EXAM: 2D DIGITAL SCREENING BILATERAL MAMMOGRAM WITH CAD AND ADJUNCT TOMO COMPARISON:  Previous exam(s). ACR Breast Density Category b: There are scattered areas of fibroglandular density. FINDINGS: There are no findings suspicious for malignancy.  Images were processed with CAD. IMPRESSION: No mammographic evidence of malignancy. A result letter of this screening mammogram will be mailed directly to the patient. RECOMMENDATION: Screening mammogram in one year. (Code:SM-B-01Y) BI-RADS CATEGORY  1: Negative. Electronically Signed   By: Lajean Manes M.D.   On: 08/08/2016 11:42    Speciality Comments: No specialty comments available.    Procedures:  No procedures performed Allergies: Doxycycline; Imuran [azathioprine]; Meperidine hcl; Remicade [infliximab]; Sulfa antibiotics; Sulfa drugs cross reactors; Avelox [moxifloxacin]; Erythromycin; and Latex   Assessment / Plan:     Visit Diagnoses: Psoriatic arthritis (HCC)She continues to have some arthralgias but no active synovitis. She gives history of intermittent swelling in her hands and her left knee. She also complains of some SI joint pain. She isn't better on subcutaneous some methotrexate although I think she will improve by adding another treatment. We'll try to apply for [facility]. Indications, side effects were discussed at length.  PSORIASIS: She still have some extra active lesions on her extremities and her trunk.  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  Primary osteoarthritis of both knees - Bilateral moderate with chondromalacia patella  DDD lumbar spine: Weight loss exercise was discussed.  Issue bursitis left: I offered cortisone injection  but patient declined. His stretching exercises were discussed.  Her other medical problems are as follows:  History of hypertension  History of ulcerative colitis  History of IBS  Essential hypertension  Gastroesophageal reflux disease     Orders: Orders Placed This Encounter  Procedures  . CBC with Differential/Platelet  . COMPLETE METABOLIC PANEL WITH GFR   Meds ordered this encounter  Medications  . methotrexate 250 MG/10ML injection    Sig: Pt will take 29m/ml of methotrexate subcutaneous every week.    Dispense:  12 mL    Refill:  0    Disp: 21mvials w/ preservatives.;  . Tuberculin-Allergy Syringes 27G X 1/2" 1 ML KIT    Sig: Inject 1 Syringe into the skin once a week. To be used with weekly methotrexate injections    Dispense:  12 each    Refill:  1    Face-to-face time spent with patient was 30 minutes. 50% of time was spent in counseling and coordination of care.  Follow-Up Instructions: Return in about 5 months (around 01/12/2017) for Psoriatic arthritis. We will contact her once OtRutherford Nails approved.   ShBo MerinoMD  Note - This record has been created using DrEditor, commissioning Chart creation errors have been sought, but may not always  have been located. Such creation errors do not reflect on  the standard of medical care.

## 2016-08-14 ENCOUNTER — Ambulatory Visit (INDEPENDENT_AMBULATORY_CARE_PROVIDER_SITE_OTHER): Payer: Medicare HMO | Admitting: Rheumatology

## 2016-08-14 ENCOUNTER — Encounter: Payer: Self-pay | Admitting: Rheumatology

## 2016-08-14 VITALS — BP 120/74 | HR 68 | Resp 14 | Ht 66.0 in | Wt 201.0 lb

## 2016-08-14 DIAGNOSIS — M170 Bilateral primary osteoarthritis of knee: Secondary | ICD-10-CM

## 2016-08-14 DIAGNOSIS — M47816 Spondylosis without myelopathy or radiculopathy, lumbar region: Secondary | ICD-10-CM | POA: Diagnosis not present

## 2016-08-14 DIAGNOSIS — Z79899 Other long term (current) drug therapy: Secondary | ICD-10-CM

## 2016-08-14 DIAGNOSIS — L408 Other psoriasis: Secondary | ICD-10-CM

## 2016-08-14 DIAGNOSIS — R7611 Nonspecific reaction to tuberculin skin test without active tuberculosis: Secondary | ICD-10-CM

## 2016-08-14 DIAGNOSIS — L4050 Arthropathic psoriasis, unspecified: Secondary | ICD-10-CM | POA: Diagnosis not present

## 2016-08-14 DIAGNOSIS — Z8679 Personal history of other diseases of the circulatory system: Secondary | ICD-10-CM

## 2016-08-14 DIAGNOSIS — I10 Essential (primary) hypertension: Secondary | ICD-10-CM

## 2016-08-14 DIAGNOSIS — M7072 Other bursitis of hip, left hip: Secondary | ICD-10-CM

## 2016-08-14 DIAGNOSIS — K219 Gastro-esophageal reflux disease without esophagitis: Secondary | ICD-10-CM | POA: Diagnosis not present

## 2016-08-14 DIAGNOSIS — Z8719 Personal history of other diseases of the digestive system: Secondary | ICD-10-CM | POA: Diagnosis not present

## 2016-08-14 LAB — CBC WITH DIFFERENTIAL/PLATELET
Basophils Absolute: 64 cells/uL (ref 0–200)
Basophils Relative: 1 %
Eosinophils Absolute: 64 cells/uL (ref 15–500)
Eosinophils Relative: 1 %
HCT: 41.1 % (ref 35.0–45.0)
Hemoglobin: 13.5 g/dL (ref 11.7–15.5)
Lymphocytes Relative: 32 %
Lymphs Abs: 2048 cells/uL (ref 850–3900)
MCH: 32.1 pg (ref 27.0–33.0)
MCHC: 32.8 g/dL (ref 32.0–36.0)
MCV: 97.9 fL (ref 80.0–100.0)
MPV: 10.5 fL (ref 7.5–12.5)
Monocytes Absolute: 512 cells/uL (ref 200–950)
Monocytes Relative: 8 %
Neutro Abs: 3712 cells/uL (ref 1500–7800)
Neutrophils Relative %: 58 %
Platelets: 297 10*3/uL (ref 140–400)
RBC: 4.2 MIL/uL (ref 3.80–5.10)
RDW: 13.6 % (ref 11.0–15.0)
WBC: 6.4 10*3/uL (ref 3.8–10.8)

## 2016-08-14 LAB — COMPLETE METABOLIC PANEL WITH GFR
ALT: 15 U/L (ref 6–29)
AST: 18 U/L (ref 10–35)
Albumin: 4.1 g/dL (ref 3.6–5.1)
Alkaline Phosphatase: 50 U/L (ref 33–130)
BUN: 17 mg/dL (ref 7–25)
CO2: 27 mmol/L (ref 20–31)
Calcium: 9.4 mg/dL (ref 8.6–10.4)
Chloride: 104 mmol/L (ref 98–110)
Creat: 0.8 mg/dL (ref 0.50–1.05)
GFR, Est African American: >89 mL/min (ref >=60)
GFR, Est Non African American: 86 mL/min (ref >=60)
Glucose, Bld: 100 mg/dL — ABNORMAL HIGH (ref 65–99)
Potassium: 4.6 mmol/L (ref 3.5–5.3)
Sodium: 142 mmol/L (ref 135–146)
Total Bilirubin: 0.3 mg/dL (ref 0.2–1.2)
Total Protein: 7.3 g/dL (ref 6.1–8.1)

## 2016-08-14 MED ORDER — METHOTREXATE SODIUM CHEMO INJECTION 250 MG/10ML: INTRAMUSCULAR | 0 refills | Status: DC

## 2016-08-14 MED ORDER — "TUBERCULIN-ALLERGY SYRINGES 27G X 1/2"" 1 ML KIT": 1.0000 | PACK | 1 refills | Status: DC

## 2016-08-14 NOTE — Progress Notes (Addendum)
Rheumatology Medication Review by a Pharmacist Does the patient feel that his/her medications are working for him/her?  Yes - reports some improvement with injectable methotrexate but continues to have joint pain/psoriasis  Has the patient been experiencing any side effects to the medications prescribed?  No Does the patient have any problems obtaining medications?  No difficulty with methotrexate, patient is unable to afford biologics  Issues to address at subsequent visits: Access to biologic medications.     Pharmacist comments:  [name] is a pleasant 51 yo F who presents for follow up of her psoriatic arthritis.  She is currently taking methotrexate 1 mL weekly and folic acid 2 mg daily.  She is no longer able to take Stelara due to cost.  We have tried applying for [employer] and Stelara patient assistance program but patient did not qualify.  [employer] does not take medicare part D patients and for Stelara, patient must spend a certain amount out of pocket before she is eligible. We also tried applying for Stelara through patient's medical benefits.  I received the notice of approval for Stelara today (Approved EOC Number: [id number], Approval dates: 08/04/2016-08/05/2018).  I spoke to [facility] Solicitor [id number]) regarding approved.  She reports patient would still be responsible for 20% co-insurance for Stelara with maximum out of pocket of $5900.  I reviewed this with patient who continues to be concerned about the cost of the medication.     Patient's most recent standing labs were on 06/01/16.  CBC and CMP were normal.  Patient will be due for labs at this time.     Elisabeth Most, Pharm.D., BCPS, CPP Clinical Pharmacist Pager: [phone] Phone: [phone] 08/14/2016 10:08 AM

## 2016-08-14 NOTE — Progress Notes (Signed)
Pharmacy Note  Subjective:  Patient presents today to the [facility] to see Dr. Estanislado Pandy.  Decision was made to try to apply for [facility] patient assistance program.  Patient was seen by the pharmacist for counseling on Otezla.  Objective: SCr: 0.88 mg/dL BUN: 12 mg/dL GFR: 77 mL/min  Assessment/Plan:  Counseled patient that Rutherford Nail is a PDE 4 inhibitor that works to treat psoriasis and the joint pain and tenderness of psoriatic arthritis.  Counseled patient on purpose, proper use, and adverse effects of Otezla.  Reviewed the most common adverse effects of weight loss, depression, nausea/diarrhea/vomiting, headaches, and nasal congestion.  Provided patient with medication education material and answered all questions.  Patient consented to [place].  Otezla patient assistance application was completed while patient was in the office.  Will fax to [facility] program.  Will update patient once I know status of application.    Elisabeth Most, Pharm.D., BCPS, CPP Clinical Pharmacist Pager: [phone] Phone: [phone] 08/14/2016 11:13 AM

## 2016-08-14 NOTE — Patient Instructions (Signed)
Standing Labs We placed an order today for your standing lab work.    Please come back and get your standing labs today and every 3 months.    We have open lab Monday through Friday from 8:30-11:30 AM and 1:30-4 PM at the office of Dr. Tresa Moore, PA.   The office is located at [address], [place], [place], [place] [address] No appointment is necessary.   Labs are drawn by [facility].  You may receive a bill from [facility] for your lab work.     Apremilast oral tablets What is this medicine? APREMILAST (a PRE mil ast) is used to treat plaque psoriasis and psoriatic arthritis. COMMON BRAND NAME(S): Rutherford Nail What should I tell my health care provider before I take this medicine? They need to know if you have any of these conditions: -kidney disease -mental illness -an unusual or allergic reaction to apremilast, other medicines, foods, dyes, or preservatives -pregnant or trying to get pregnant -breast-feeding How should I use this medicine? Take this medicine by mouth with a glass of water. Follow the directions on the prescription label. Do not cut, crush or chew this medicine. You can take it with or without food. If it upsets your stomach, take it with food. Take your medicine at regular intervals. Do not take it more often than directed. Do not stop taking except on your doctor's advice. Talk to your pediatrician regarding the use of this medicine in children. Special care may be needed. What if I miss a dose? If you miss a dose, take it as soon as you can. If it is almost time for your next dose, take only that dose. Do not take double or extra doses. What may interact with this medicine? This medicine may interact with the following medications: -certain medicines for seizures like carbamazepine, phenobarbital, phenytoin -rifampin What should I watch for while using this medicine? Tell your doctor or healthcare professional if your symptoms do not start to get  better or if they get worse. Patients and their families should watch out for new or worsening depression or thoughts of suicide. Also watch out for sudden changes in feelings such as feeling anxious, agitated, panicky, irritable, hostile, aggressive, impulsive, severely restless, overly excited and hyperactive, or not being able to sleep. If this happens, call your health care professional. What side effects may I notice from receiving this medicine? Side effects that you should report to your doctor or health care professional as soon as possible: -depressed mood -weight loss Side effects that usually do not require medical attention (report to your doctor or health care professional if they continue or are bothersome): -diarrhea -headache -nausea, vomiting Where should I keep my medicine? Keep out of the reach of children. Store below 30 degrees C (86 degrees F). Throw away any unused medicine after the expiration date.  2017 Elsevier/Gold Standard (2015-08-04 18:01:15)

## 2016-08-15 NOTE — Progress Notes (Signed)
Labs normal.

## 2016-08-21 DIAGNOSIS — E7800 Pure hypercholesterolemia, unspecified: Secondary | ICD-10-CM | POA: Diagnosis not present

## 2016-08-21 DIAGNOSIS — F322 Major depressive disorder, single episode, severe without psychotic features: Secondary | ICD-10-CM | POA: Diagnosis not present

## 2016-08-21 DIAGNOSIS — G2581 Restless legs syndrome: Secondary | ICD-10-CM | POA: Diagnosis not present

## 2016-08-21 DIAGNOSIS — G4700 Insomnia, unspecified: Secondary | ICD-10-CM | POA: Diagnosis not present

## 2016-08-21 DIAGNOSIS — Z0000 Encounter for general adult medical examination without abnormal findings: Secondary | ICD-10-CM | POA: Diagnosis not present

## 2016-08-21 DIAGNOSIS — E669 Obesity, unspecified: Secondary | ICD-10-CM | POA: Diagnosis not present

## 2016-08-21 DIAGNOSIS — K5190 Ulcerative colitis, unspecified, without complications: Secondary | ICD-10-CM | POA: Diagnosis not present

## 2016-08-21 DIAGNOSIS — E039 Hypothyroidism, unspecified: Secondary | ICD-10-CM | POA: Diagnosis not present

## 2016-08-21 DIAGNOSIS — I10 Essential (primary) hypertension: Secondary | ICD-10-CM | POA: Diagnosis not present

## 2016-08-21 DIAGNOSIS — E559 Vitamin D deficiency, unspecified: Secondary | ICD-10-CM | POA: Diagnosis not present

## 2016-08-21 DIAGNOSIS — N951 Menopausal and female climacteric states: Secondary | ICD-10-CM | POA: Diagnosis not present

## 2016-08-22 NOTE — Progress Notes (Signed)
Called [employer] to get an update on [name]'s patient assistance application. Vince, the representative, states that the application was received on 08/14/16. He verified that patient had PartD insurance with a high copay and forwarded her application to the proof of income review. He could not confirm a time range as to when a decision would be made.We will be notified via fax or mail.   Reference; [id number] Phone: [phone]  Demetrios Loll, CPhT 10:37 AM

## 2016-08-28 ENCOUNTER — Encounter: Payer: Self-pay | Admitting: Pharmacist

## 2016-08-28 NOTE — Progress Notes (Signed)
Called [employer] to get an update on Ms. Mcdill's patient assistance application.  I spoke to [place] who reports the application is still pending.  She was unable to tell me how long it takes for the application to process.  Will continue to check on the status of patient's application.     Reference; [id number] Phone: [place], Pharm.D., BCPS, CPP Clinical Pharmacist Pager: [phone] Phone: [phone] 08/28/2016 3:59 PM

## 2016-09-05 NOTE — Progress Notes (Signed)
Called [employer] to check the status on patient's application. Spoke with Lonn [place] who states the application is still pending. She could not give me a time when a decision would be made. Will continue to check back and update the patient when we receive a response.  Reference number: [id number]  Phone number [phone]  Demetrios Loll, CPhT

## 2016-09-08 ENCOUNTER — Telehealth: Payer: Self-pay | Admitting: Pharmacist

## 2016-09-08 DIAGNOSIS — R300 Dysuria: Secondary | ICD-10-CM | POA: Diagnosis not present

## 2016-09-08 NOTE — Telephone Encounter (Signed)
I spoke to patient and informed her the Rutherford Nail was approved through the [place] patient assistance program.  I provided patient with the phone number of the [facility] patient assistance program.  Reviewed that Rutherford Nail will be mailed to her home.  I advised patient to be sure she starts with the Otezla starter pack.  I reviewed purpose, proper use, and adverse effects of Otezla with patient.  Patient denies any questions or concerns at this time.     Elisabeth Most, Pharm.D., BCPS, CPP Clinical Pharmacist Pager: [phone] Phone: [phone] 09/08/2016 5:20 PM

## 2016-09-08 NOTE — Telephone Encounter (Signed)
Patient returned Dr. Hans Eden phone call. Please call patient back.

## 2016-09-08 NOTE — Telephone Encounter (Signed)
Received fax that patient was approved for [facility] patient assistance program through 07/16/17.  I called patient to inform her.  Left message asking her to call me back.    Elisabeth Most, Pharm.D., BCPS, CPP Clinical Pharmacist Pager: [phone] Phone: [phone] 09/08/2016 3:18 PM

## 2016-09-11 ENCOUNTER — Emergency Department (HOSPITAL_COMMUNITY)
Admission: EM | Admit: 2016-09-11 | Discharge: 2016-09-11 | Disposition: A | Discharge status: Home / Self Care | Payer: Medicare HMO | Attending: Emergency Medicine | Admitting: Emergency Medicine

## 2016-09-11 ENCOUNTER — Emergency Department (HOSPITAL_COMMUNITY): Payer: Medicare HMO

## 2016-09-11 ENCOUNTER — Encounter (HOSPITAL_COMMUNITY): Payer: Self-pay | Admitting: Emergency Medicine

## 2016-09-11 DIAGNOSIS — Z87891 Personal history of nicotine dependence: Secondary | ICD-10-CM | POA: Diagnosis not present

## 2016-09-11 DIAGNOSIS — R319 Hematuria, unspecified: Secondary | ICD-10-CM | POA: Diagnosis not present

## 2016-09-11 DIAGNOSIS — Z91040 Latex allergy status: Secondary | ICD-10-CM | POA: Insufficient documentation

## 2016-09-11 DIAGNOSIS — Z79899 Other long term (current) drug therapy: Secondary | ICD-10-CM | POA: Insufficient documentation

## 2016-09-11 DIAGNOSIS — R109 Unspecified abdominal pain: Secondary | ICD-10-CM

## 2016-09-11 DIAGNOSIS — I10 Essential (primary) hypertension: Secondary | ICD-10-CM | POA: Diagnosis not present

## 2016-09-11 DIAGNOSIS — E039 Hypothyroidism, unspecified: Secondary | ICD-10-CM | POA: Diagnosis not present

## 2016-09-11 LAB — BASIC METABOLIC PANEL
Anion gap: 13 (ref 5–15)
BUN: 22 mg/dL — ABNORMAL HIGH (ref 6–20)
CO2: 27 mmol/L (ref 22–32)
Calcium: 9.9 mg/dL (ref 8.9–10.3)
Chloride: 98 mmol/L — ABNORMAL LOW (ref 101–111)
Creatinine, Ser: 0.97 mg/dL (ref 0.44–1.00)
GFR calc Af Amer: >60 mL/min (ref >60)
GFR calc non Af Amer: >60 mL/min (ref >60)
Glucose, Bld: 100 mg/dL — ABNORMAL HIGH (ref 65–99)
Potassium: 3.6 mmol/L (ref 3.5–5.1)
Sodium: 138 mmol/L (ref 135–145)

## 2016-09-11 LAB — URINALYSIS, ROUTINE W REFLEX MICROSCOPIC
Bacteria, UA: NONE SEEN
Bilirubin Urine: NEGATIVE
Glucose, UA: NEGATIVE mg/dL
Ketones, ur: 5 mg/dL — AB
Leukocytes, UA: NEGATIVE
Nitrite: NEGATIVE
Protein, ur: NEGATIVE mg/dL
Specific Gravity, Urine: 1.011 (ref 1.005–1.030)
pH: 6 (ref 5.0–8.0)

## 2016-09-11 LAB — CBC
HCT: 41.3 % (ref 36.0–46.0)
Hemoglobin: 13.8 g/dL (ref 12.0–15.0)
MCH: 32.2 pg (ref 26.0–34.0)
MCHC: 33.4 g/dL (ref 30.0–36.0)
MCV: 96.3 fL (ref 78.0–100.0)
Platelets: 314 10*3/uL (ref 150–400)
RBC: 4.29 MIL/uL (ref 3.87–5.11)
RDW: 12.7 % (ref 11.5–15.5)
WBC: 11.4 10*3/uL — ABNORMAL HIGH (ref 4.0–10.5)

## 2016-09-11 LAB — PREGNANCY, URINE: Preg Test, Ur: NEGATIVE

## 2016-09-11 MED ORDER — HYDROCODONE-ACETAMINOPHEN 5-325 MG PO TABS
1.0000 | ORAL_TABLET | Freq: Four times a day (QID) | ORAL | 0 refills | Status: DC | PRN
Start: 2016-09-11 — End: 2017-03-01

## 2016-09-11 MED ORDER — MORPHINE SULFATE (PF) 4 MG/ML IV SOLN
4.0000 mg | Freq: Once | INTRAVENOUS | Status: AC
  Administered 2016-09-11: 4 mg via INTRAVENOUS
  Filled 2016-09-11: qty 1

## 2016-09-11 MED ORDER — ONDANSETRON HCL 4 MG/2ML IJ SOLN
4.0000 mg | Freq: Once | INTRAMUSCULAR | Status: AC
  Administered 2016-09-11: 4 mg via INTRAVENOUS
  Filled 2016-09-11: qty 2

## 2016-09-11 MED ORDER — CIPROFLOXACIN HCL 500 MG PO TABS: ORAL_TABLET | ORAL | 0 refills | Status: DC

## 2016-09-11 MED ORDER — SODIUM CHLORIDE 0.9 % IV BOLUS (SEPSIS)
1000.0000 mL | Freq: Once | INTRAVENOUS | Status: AC
  Administered 2016-09-11: 1000 mL via INTRAVENOUS

## 2016-09-11 NOTE — ED Notes (Signed)
Patient transported to CT 

## 2016-09-11 NOTE — ED Provider Notes (Signed)
[facility] DEPT Provider Note   CSN: [id number] Arrival date & time: 09/11/16  0310  By signing my name below, I, Jaquelyn Bitter., attest that this documentation has been prepared under the direction and in the presence of No att. providers found. Electronically signed: Jaquelyn Bitter., ED Scribe. 09/11/16. 3:33 AM.   History   Chief Complaint Chief Complaint  Patient presents with  . Back Pain    HPI [name] is a 51 y.o. female with hx of ulcerative colitis who presents to the Emergency Department complaining of constant, worsening mild to moderate R flank pain with sudden onset x1 day. Pt states that the R flank pain has gotten progressively worse and radiates to the abdomen. Pt reports nausea. She denies any modifying factors. She denies fever, vomiting, dysuria, blood in stool, diarrhea. Pt denies hx of kidney stones. Of note, pt is has been treated with Cipro for a bladder infection the past x2 days.   The history is provided by the patient. No language interpreter was used.  Back Pain   This is a new problem. The current episode started yesterday. The problem occurs constantly. The problem has been gradually worsening. The pain is associated with no known injury. Pain location: R flank. Radiates to: abdomen. The pain is moderate. The pain is the same all the time. Pertinent negatives include no fever and no dysuria. She has tried nothing for the symptoms.    Past Medical History:  Diagnosis Date  . Arthritis   . Fibrocystic breast disease   . GERD (gastroesophageal reflux disease)   . Hyperlipidemia   . Hypertension   . Hypothyroidism   . Irritable bowel syndrome   . Ovarian cyst   . Pancreatitis   . Psoriasis   . Psoriatic arthritis ([place]) 05/12/2016   Poor response to Humira Inadequate response to Enbrel Inadequate response to Simponi  . Tachycardia   . Ulcerative colitis     Patient Active Problem List   Diagnosis Date Noted  .  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 Primary osteoarthritis of both knees 08/11/2016  . History of hypertension 08/11/2016  . History of ulcerative colitis 08/11/2016  . History of IBS 08/11/2016  . 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 High risk medications (not anticoagulants) long-term use 05/13/2016  . Knee pain, bilateral 05/13/2016  . Anserine bursitis 05/13/2016  . 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 Chondromalacia patellae, right knee 05/13/2016  . Psoriatic arthritis ([place]) 05/12/2016  . UNSPECIFIED HYPOTHYROIDISM 08/24/2009  . HYPERLIPIDEMIA 08/24/2009  . Essential hypertension 08/24/2009  . GERD 08/24/2009  . Ulcerative colitis ([place]) 08/24/2009  . IRRITABLE BOWEL SYNDROME 08/24/2009  . OVARIAN CYST 08/24/2009  . PSORIASIS 08/24/2009  . ARTHRITIS 08/24/2009  . TACHYCARDIA, HX OF 08/24/2009  . PANCREATITIS, HX OF 08/24/2009  . FIBROCYSTIC BREAST DISEASE, HX OF 08/24/2009    Past Surgical History:  Procedure Laterality Date  . ANKLE RECONSTRUCTION     left  . bladder tuck    . COLONOSCOPY    . KNEE SURGERY     left  . NASAL SEPTUM SURGERY    . NISSEN FUNDOPLICATION    . PARTIAL HYSTERECTOMY    . repair cystocele and rectocele    . thyroid ablation    . TUBAL LIGATION      OB History    No data available       Home Medications    Prior to Admission medications  Medication Sig Start Date End Date Taking? Authorizing Provider  Calcium Carbonate-Vit D-Min (CALCIUM 1200 PO) Take 1 tablet by mouth daily.    Historical Provider, MD  clonazePAM (KLONOPIN) 1 MG tablet Take 1 mg by mouth as needed for anxiety.    Historical Provider, MD  estradiol (ESTRACE) 1 MG tablet Take 1 mg by mouth daily.    Historical Provider, MD  folic acid (FOLVITE) 1 MG tablet Take 2 mg by mouth daily.    Historical Provider, MD  ibuprofen (ADVIL,MOTRIN) 200 MG tablet Take 200 mg by mouth every 6 (six) hours as needed.    Historical  Provider, MD  levothyroxine (SYNTHROID, LEVOTHROID) 125 MCG tablet Take 125 mcg by mouth daily.    Historical Provider, MD  methotrexate 250 MG/10ML injection Pt will take 85m/ml of methotrexate subcutaneous every week. 08/14/16   SBo Merino MD  Multiple Vitamins-Minerals (CENTRUM SILVER ULTRA WOMENS PO) Take 1 tablet by mouth daily.    Historical Provider, MD  omeprazole (PRILOSEC) 20 MG capsule Take 20 mg by mouth daily. Reported on 07/27/2015    Historical Provider, MD  pravastatin (PRAVACHOL) 40 MG tablet Take 80 mg by mouth daily.     Historical Provider, MD  sertraline (ZOLOFT) 100 MG tablet Take 100 mg by mouth daily.    Historical Provider, MD  Tuberculin-Allergy Syringes 27G X 1/2" 1 ML KIT Inject 1 Syringe into the skin once a week. To be used with weekly methotrexate injections 08/14/16   SBo Merino MD  ustekinumab (STELARA) 45 MG/0.5ML SOSY injection Inject 45 mg into the skin every 3 (three) months.    Historical Provider, MD    Family History Family History  Problem Relation Age of Onset  . Breast cancer Mother   . Heart disease Mother   . Colon polyps Mother   . Other Mother     pre-cancerous tumor removed  . Heart disease Father   . Crohn's disease Sister   . Irritable bowel syndrome    . Colon cancer Neg Hx     Social History Social History  Substance Use Topics  . Smoking status: Former Smoker    Quit date: 07/27/2005  . Smokeless tobacco: Never Used  . Alcohol use 0.0 oz/week     Comment: occasionaly     Allergies   Doxycycline; Imuran [azathioprine]; Meperidine hcl; Remicade [infliximab]; Sulfa antibiotics; Sulfa drugs cross reactors; Avelox [moxifloxacin]; Erythromycin; and Latex   Review of Systems Review of Systems  Constitutional: Negative for fever.  Gastrointestinal: Positive for nausea. Negative for blood in stool, diarrhea and vomiting.  Genitourinary: Negative for dysuria.  Musculoskeletal: Positive for back pain.  All other  systems reviewed and are negative.    Physical Exam Updated Vital Signs BP 176/94 (BP Location: Left Arm)   Pulse (!) 122   Temp 97.8 F (36.6 C) (Oral)   Resp 17   Ht 5' 6"  (1.676 m)   Wt 195 lb (88.5 kg)   SpO2 96%   BMI 31.47 kg/m   Physical Exam  CONSTITUTIONAL: Well developed/well nourished, uncomfortable appearing HEAD: Normocephalic/atraumatic ENMT: Mucous membranes moist NECK: supple no meningeal signs SPINE/BACK:entire spine nontender CV: S1/S2 noted, no murmurs/rubs/gallops noted LUNGS: Lungs are clear to auscultation bilaterally, no apparent distress ABDOMEN: soft, nontender, no rebound or guarding, bowel sounds noted throughout abdomen GU:R cva tenderness NEURO: Pt is awake/alert/appropriate, moves all extremitiesx4.  No facial droop.   EXTREMITIES: pulses normal/equal, full ROM SKIN: warm, color normal PSYCH: no abnormalities of mood noted, alert  and oriented to situation  ED Treatments / Results   DIAGNOSTIC STUDIES: Oxygen Saturation is 96% on RA, adequate by my interpretation.   COORDINATION OF CARE: 3:34 AM-Discussed next steps with pt. Pt verbalized understanding and is agreeable with the plan.    Labs (all labs ordered are listed, but only abnormal results are displayed) Labs Reviewed  URINALYSIS, ROUTINE W REFLEX MICROSCOPIC - Abnormal; Notable for the following:       Result Value   Color, Urine STRAW (*)    Hgb urine dipstick MODERATE (*)    Ketones, ur 5 (*)    Squamous Epithelial / LPF 0-5 (*)    All other components within normal limits  BASIC METABOLIC PANEL - Abnormal; Notable for the following:    Chloride 98 (*)    Glucose, Bld 100 (*)    BUN 22 (*)    All other components within normal limits  CBC - Abnormal; Notable for the following:    WBC 11.4 (*)    All other components within normal limits  PREGNANCY, URINE    EKG  EKG Interpretation None       Radiology Ct Renal Stone Study  Result Date:  09/11/2016 CLINICAL DATA:  Acute onset of severe right flank pain and nausea. Hematuria Initial encounter. EXAM: CT ABDOMEN AND PELVIS WITHOUT CONTRAST TECHNIQUE: Multidetector CT imaging of the abdomen and pelvis was performed following the standard protocol without IV contrast. COMPARISON:  CT of the abdomen pelvis performed 01/08/2007, and abdominal ultrasound performed 10/02/2006 FINDINGS: Lower chest: The visualized lung bases are grossly clear. The visualized portions of the mediastinum are unremarkable. Postoperative change is noted about the gastroesophageal junction. Hepatobiliary: The liver is unremarkable in appearance. The gallbladder is unremarkable in appearance. The common bile duct remains normal in caliber. Pancreas: The pancreas is within normal limits. Spleen: The spleen is unremarkable in appearance. Adrenals/Urinary Tract: The adrenal glands are unremarkable in appearance. Minimal stranding is noted about the renal pelves and proximal ureters bilaterally, raising concern for mild bilateral ureteritis. There is no evidence of hydronephrosis. No renal or ureteral stones are identified. No perinephric stranding is seen. Stomach/Bowel: The stomach is unremarkable in appearance. The small bowel is within normal limits. The patient is status post appendectomy. The colon is unremarkable in appearance. Vascular/Lymphatic: Scattered calcification is seen along the abdominal aorta and its branches. The abdominal aorta is otherwise grossly unremarkable. The inferior vena cava is grossly unremarkable. No retroperitoneal lymphadenopathy is seen. No pelvic sidewall lymphadenopathy is identified. Reproductive: The bladder is mildly distended and grossly unremarkable. The patient is status post hysterectomy. No suspicious adnexal masses are seen. The left ovary is unremarkable in appearance. Other: A tiny anterior abdominal wall hernia is noted superior to the umbilicus, containing only fat, with minimal soft  tissue inflammation. Musculoskeletal: No acute osseous abnormalities are identified. The visualized musculature is unremarkable in appearance. IMPRESSION: 1. Minimal stranding about the renal pelvis and proximal ureters bilaterally, raising concern for mild bilateral ureteritis. No evidence of hydronephrosis. 2. Tiny anterior abdominal wall hernia superior to the umbilicus, containing only fat, with minimal soft tissue inflammation. 3. Scattered aortic atherosclerosis. Electronically Signed   By: Garald Balding M.D.   On: 09/11/2016 06:17    Procedures Procedures (including critical care time)  Medications Ordered in ED Medications  ondansetron (ZOFRAN) injection 4 mg (4 mg Intravenous Given 09/11/16 0409)  morphine 4 MG/ML injection 4 mg (4 mg Intravenous Given 09/11/16 0409)  sodium chloride 0.9 % bolus 1,000 mL (  0 mLs Intravenous Stopped 09/11/16 0653)  morphine 4 MG/ML injection 4 mg (4 mg Intravenous Given 09/11/16 0502)     Initial Impression / Assessment and Plan / ED Course  I have reviewed the triage vital signs and the nursing notes.  Pertinent labs   results that were available during my care of the patient were reviewed by me and considered in my medical decision making (see chart for details).     Pt with continued pain concern for ureteral stone with hematuria Ct does not reveal stones but ?ureteritis She is now improved Otherwise clinically stable She only has cipro for 3 days, will add on for full 7 days coverage Short course of vicodin Narcotic database reviewed and considered in decision making Referred to urology   Final Clinical Impressions(s) / ED Diagnoses   Final diagnoses:  Hematuria, unspecified type  Right flank pain    New Prescriptions Discharge Medication List as of 09/11/2016  6:52 AM    START taking these medications   Details  ciprofloxacin (CIPRO) 500 MG tablet One po bid x 4 days, Print    HYDROcodone-acetaminophen (NORCO/VICODIN) 5-325 MG  tablet Take 1 tablet by mouth every 6 (six) hours as needed., Starting Mon 09/11/2016, Print       I personally performed the services described in this documentation, which was scribed in my presence. The recorded information has been reviewed and is accurate.       Ripley Fraise, MD 09/11/16 [phone]

## 2016-09-11 NOTE — ED Triage Notes (Signed)
Pt c/o B/L low back pain that radiates around to abdomen onset yesterday afternoon. Pt currently being treated for bladder infection with Cipro started Friday.

## 2016-09-25 ENCOUNTER — Other Ambulatory Visit (HOSPITAL_COMMUNITY): Payer: Self-pay | Admitting: Surgery

## 2016-09-25 DIAGNOSIS — R1110 Vomiting, unspecified: Secondary | ICD-10-CM

## 2016-09-25 DIAGNOSIS — R110 Nausea: Secondary | ICD-10-CM

## 2016-09-29 ENCOUNTER — Ambulatory Visit (HOSPITAL_COMMUNITY)
Admission: RE | Admit: 2016-09-29 | Discharge: 2016-09-29 | Disposition: A | Discharge status: Home / Self Care | Payer: Medicare HMO | Source: Ambulatory Visit | Attending: Surgery | Admitting: Surgery

## 2016-09-29 DIAGNOSIS — R1110 Vomiting, unspecified: Secondary | ICD-10-CM

## 2016-09-29 DIAGNOSIS — R110 Nausea: Secondary | ICD-10-CM | POA: Diagnosis not present

## 2016-09-29 DIAGNOSIS — R1310 Dysphagia, unspecified: Secondary | ICD-10-CM | POA: Diagnosis not present

## 2016-11-08 ENCOUNTER — Ambulatory Visit (INDEPENDENT_AMBULATORY_CARE_PROVIDER_SITE_OTHER): Payer: Medicare HMO | Admitting: Physical Medicine and Rehabilitation

## 2016-11-09 DIAGNOSIS — R1319 Other dysphagia: Secondary | ICD-10-CM | POA: Diagnosis not present

## 2016-11-10 ENCOUNTER — Other Ambulatory Visit: Payer: Self-pay | Admitting: Radiology

## 2016-11-10 DIAGNOSIS — Z79899 Other long term (current) drug therapy: Secondary | ICD-10-CM | POA: Diagnosis not present

## 2016-11-10 LAB — COMPLETE METABOLIC PANEL WITH GFR
AG Ratio: 1.2 Ratio (ref 1.0–2.5)
ALT: 14 U/L (ref 6–29)
AST: 17 U/L (ref 10–35)
Albumin: 3.9 g/dL (ref 3.6–5.1)
Alkaline Phosphatase: 57 U/L (ref 33–130)
BUN/Creatinine Ratio: 17.7 Ratio (ref 6–22)
BUN: 14 mg/dL (ref 7–25)
CO2: 23 mmol/L (ref 20–31)
Calcium: 9 mg/dL (ref 8.6–10.4)
Chloride: 105 mmol/L (ref 98–110)
Creat: 0.79 mg/dL (ref 0.50–1.05)
GFR, Est African American: >89 mL/min (ref >=60)
GFR, Est Non African American: 87 mL/min (ref >=60)
Globulin: 3.2 g/dL (ref 1.9–3.7)
Glucose, Bld: 100 mg/dL — ABNORMAL HIGH (ref 65–99)
Potassium: 4.6 mmol/L (ref 3.5–5.3)
Sodium: 139 mmol/L (ref 135–146)
Total Bilirubin: 0.3 mg/dL (ref 0.2–1.2)
Total Protein: 7.1 g/dL (ref 6.1–8.1)

## 2016-11-10 LAB — CBC WITH DIFFERENTIAL/PLATELET
Basophils Absolute: 0 cells/uL (ref 0–200)
Basophils Relative: 0 %
Eosinophils Absolute: 142 cells/uL (ref 15–500)
Eosinophils Relative: 2 %
HCT: 39.6 % (ref 35.0–45.0)
Hemoglobin: 13.2 g/dL (ref 11.7–15.5)
Lymphocytes Relative: 31 %
Lymphs Abs: 2201 cells/uL (ref 850–3900)
MCH: 32.1 pg (ref 27.0–33.0)
MCHC: 33.3 g/dL (ref 32.0–36.0)
MCV: 96.4 fL (ref 80.0–100.0)
MPV: 10.1 fL (ref 7.5–12.5)
Monocytes Absolute: 568 cells/uL (ref 200–950)
Monocytes Relative: 8 %
Neutro Abs: 4189 cells/uL (ref 1500–7800)
Neutrophils Relative %: 59 %
Platelets: 320 10*3/uL (ref 140–400)
RBC: 4.11 MIL/uL (ref 3.80–5.10)
RDW: 14.2 % (ref 11.0–15.0)
WBC: 7.1 10*3/uL (ref 3.8–10.8)

## 2016-11-12 ENCOUNTER — Other Ambulatory Visit: Payer: Self-pay | Admitting: Rheumatology

## 2016-11-13 NOTE — Telephone Encounter (Signed)
08/14/16 last visit  01/09/17 next visit  Labs WNL 11/10/16 Ok to refill per Dr Estanislado Pandy

## 2016-12-04 DIAGNOSIS — K219 Gastro-esophageal reflux disease without esophagitis: Secondary | ICD-10-CM | POA: Diagnosis not present

## 2016-12-04 DIAGNOSIS — R000 Tachycardia, unspecified: Secondary | ICD-10-CM | POA: Diagnosis not present

## 2016-12-04 DIAGNOSIS — I10 Essential (primary) hypertension: Secondary | ICD-10-CM | POA: Diagnosis not present

## 2016-12-04 DIAGNOSIS — N289 Disorder of kidney and ureter, unspecified: Secondary | ICD-10-CM | POA: Diagnosis not present

## 2016-12-04 DIAGNOSIS — Z6832 Body mass index (BMI) 32.0-32.9, adult: Secondary | ICD-10-CM | POA: Diagnosis not present

## 2016-12-04 DIAGNOSIS — E039 Hypothyroidism, unspecified: Secondary | ICD-10-CM | POA: Diagnosis not present

## 2016-12-04 DIAGNOSIS — E7800 Pure hypercholesterolemia, unspecified: Secondary | ICD-10-CM | POA: Diagnosis not present

## 2016-12-15 ENCOUNTER — Encounter (HOSPITAL_COMMUNITY): Payer: Self-pay | Admitting: *Deleted

## 2017-01-04 NOTE — Progress Notes (Signed)
Please place orders in EPIC as patient has a pre-op appointment on 01/09/2017! Thank you!

## 2017-01-05 NOTE — Progress Notes (Signed)
Office Visit Note  Patient: [name]             Date of Birth: 03-07-1966           MRN: [id number]             PCP: Mayra Neer, MD Referring: Mayra Neer, MD Visit Date: 01/09/2017 Occupation: @GUAROCC @    Subjective:  No chief complaint on file.   History of Present Illness: [name] is a 51 y.o. female  Was last seen  08/14/2016 by Dr. Estanislado Pandy for psoriasis and psoriatic arthritis. At that visit, she was complaining that she was having pain and discomfort in her hands and feet and knees and shoulders. She also had intermittent swelling in her hands and left knee joint at that time. She was on methotrexate at that time and although it was helping her psoriasis some, her joints were not getting enough relief.  It was decided to add [place] N[place] to her treatment regimen and continue the methotrexate.  Today, patient reports that she feels like she is getting good response from the [place]. She has been taking 30 mg twice a day and doing well with the medication in addition to her methotrexate 1.0 mL's a week, folic acid 2 mg per day  Most of her psoriasis has resolved except the plantar aspect of bilateral feet. We discussed using clobetasol ointment for getting control of the psoriasis and patient is agreeable. Note: She did have psoriasis to her scalp as well as her elbows but that has resolved after she began using [place].  Activities of Daily Living:  Patient reports morning stiffness for 60 minutes.   Patient Denies nocturnal pain.  Difficulty dressing/grooming: Denies Difficulty climbing stairs: Denies Difficulty getting out of chair: Denies Difficulty using hands for taps, buttons, cutlery, and/or writing: Denies   Review of Systems  Constitutional: Negative for fatigue.  HENT: Negative for mouth sores and mouth dryness.   Eyes: Negative for dryness.  Respiratory: Negative for shortness of breath.   Gastrointestinal: Negative for constipation and  diarrhea.  Musculoskeletal: Negative for myalgias and myalgias.  Skin: Negative for sensitivity to sunlight.  Psychiatric/Behavioral: Negative for decreased concentration and sleep disturbance.    PMFS History:  Patient Active Problem List   Diagnosis Date Noted  . 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 Primary osteoarthritis of both knees 08/11/2016  . History of hypertension 08/11/2016  . History of ulcerative colitis 08/11/2016  . History of IBS 08/11/2016  . 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 High risk medications (not anticoagulants) long-term use 05/13/2016  . Knee pain, bilateral 05/13/2016  . Anserine bursitis 05/13/2016  . 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 Chondromalacia patellae, right knee 05/13/2016  . Psoriatic arthritis ([place]) 05/12/2016  . UNSPECIFIED HYPOTHYROIDISM 08/24/2009  . HYPERLIPIDEMIA 08/24/2009  . Essential hypertension 08/24/2009  . GERD 08/24/2009  . Ulcerative colitis ([place]) 08/24/2009  . IRRITABLE BOWEL SYNDROME 08/24/2009  . OVARIAN CYST 08/24/2009  . PSORIASIS 08/24/2009  . ARTHRITIS 08/24/2009  . TACHYCARDIA, HX OF 08/24/2009  . PANCREATITIS, HX OF 08/24/2009  . FIBROCYSTIC BREAST DISEASE, HX OF 08/24/2009    Past Medical History:  Diagnosis Date  . Arthritis   . Fibrocystic breast disease   . GERD (gastroesophageal reflux disease)   . Hyperlipidemia   . Hypertension   . Hypothyroidism   . Irritable bowel syndrome   . Ovarian cyst   . Pancreatitis   . Psoriasis   .  Psoriatic arthritis ([place]) 05/12/2016   Poor response to Humira Inadequate response to Enbrel Inadequate response to Simponi  . Tachycardia   . Ulcerative colitis     Family History  Problem Relation Age of Onset  . Breast cancer Mother   . Heart disease Mother   . Colon polyps Mother   . Other Mother        pre-cancerous tumor removed  . Heart disease Father   . Crohn's disease Sister   . Irritable bowel syndrome  Unknown   . Colon cancer Neg Hx    Past Surgical History:  Procedure Laterality Date  . ANKLE RECONSTRUCTION     left  . bladder tuck    . COLONOSCOPY    . KNEE SURGERY     left  . NASAL SEPTUM SURGERY    . NISSEN FUNDOPLICATION    . PARTIAL HYSTERECTOMY    . repair cystocele and rectocele    . thyroid ablation    . TUBAL LIGATION     Social History   Social History Narrative  . No narrative on file     Objective: Vital Signs: There were no vitals taken for this visit.   Physical Exam  Constitutional: She is oriented to person, place, and time. She appears well-developed and well-nourished.  HENT:  Head: Normocephalic and atraumatic.  Eyes: EOM are normal. Pupils are equal, round, and reactive to light.  Cardiovascular: Normal rate, regular rhythm and normal heart sounds.  Exam reveals no gallop and no friction rub.   No murmur heard. Pulmonary/Chest: Effort normal and breath sounds normal. She has no wheezes. She has no rales.  Abdominal: Soft. Bowel sounds are normal. She exhibits no distension. There is no tenderness. There is no guarding. No hernia.  Musculoskeletal: Normal range of motion. She exhibits no edema, tenderness or deformity.  Lymphadenopathy:    She has no cervical adenopathy.  Neurological: She is alert and oriented to person, place, and time. Coordination normal.  Skin: Skin is warm and dry. Capillary refill takes less than 2 seconds. No rash noted.  Psychiatric: She has a normal mood and affect. Her behavior is normal.  Nursing note and vitals reviewed.    Musculoskeletal Exam:  Full range of motion of all joints Grip strength is equal and strong bilaterally Fiber myalgia tender points are absent  CDAI Exam: No CDAI exam completed.  No synovitis on exam  Investigation: No additional findings. Orders Only on 11/10/2016  Component Date Value Ref Range Status  . WBC 11/10/2016 7.1  3.8 - 10.8 K/uL Final  . RBC 11/10/2016 4.11  3.80 - 5.10  MIL/uL Final  . Hemoglobin 11/10/2016 13.2  11.7 - 15.5 g/dL Final  . HCT 11/10/2016 39.6  35.0 - 45.0 % Final  . MCV 11/10/2016 96.4  80.0 - 100.0 fL Final  . MCH 11/10/2016 32.1  27.0 - 33.0 pg Final  . MCHC 11/10/2016 33.3  32.0 - 36.0 g/dL Final  . RDW 11/10/2016 14.2  11.0 - 15.0 % Final  . Platelets 11/10/2016 320  140 - 400 K/uL Final  . MPV 11/10/2016 10.1  7.5 - 12.5 fL Final  . Neutro Abs 11/10/2016 4189  1,500 - 7,800 cells/uL Final  . Lymphs Abs 11/10/2016 2201  850 - 3,900 cells/uL Final  . Monocytes Absolute 11/10/2016 568  200 - 950 cells/uL Final  . Eosinophils Absolute 11/10/2016 142  15 - 500 cells/uL Final  . Basophils Absolute 11/10/2016 0  0 - 200 cells/uL  Final  . Neutrophils Relative % 11/10/2016 59  % Final  . Lymphocytes Relative 11/10/2016 31  % Final  . Monocytes Relative 11/10/2016 8  % Final  . Eosinophils Relative 11/10/2016 2  % Final  . Basophils Relative 11/10/2016 0  % Final  . Smear Review 11/10/2016 Criteria for review not met   Final  . Sodium 11/10/2016 139  135 - 146 mmol/L Final  . Potassium 11/10/2016 4.6  3.5 - 5.3 mmol/L Final  . Chloride 11/10/2016 105  98 - 110 mmol/L Final  . CO2 11/10/2016 23  20 - 31 mmol/L Final  . Glucose, Bld 11/10/2016 100* 65 - 99 mg/dL Final  . BUN 11/10/2016 14  7 - 25 mg/dL Final  . Creat 11/10/2016 0.79  0.50 - 1.05 mg/dL Final   Comment:   For patients > or = 51 years of age: The upper reference limit for Creatinine is approximately 13% higher for people identified as African-American.     . Total Bilirubin 11/10/2016 0.3  0.2 - 1.2 mg/dL Final  . Alkaline Phosphatase 11/10/2016 57  33 - 130 U/L Final  . AST 11/10/2016 17  10 - 35 U/L Final  . ALT 11/10/2016 14  6 - 29 U/L Final  . Total Protein 11/10/2016 7.1  6.1 - 8.1 g/dL Final  . Albumin 11/10/2016 3.9  3.6 - 5.1 g/dL Final  . Calcium 11/10/2016 9.0  8.6 - 10.4 mg/dL Final  . Globulin 11/10/2016 3.2  1.9 - 3.7 g/dL Final  . AG Ratio 11/10/2016 1.2   1.0 - 2.5 Ratio Final  . BUN/Creatinine Ratio 11/10/2016 17.7  6 - 22 Ratio Final  . GFR, Est African American 11/10/2016 >89  >=60 mL/min Final  . GFR, Est Non African American 11/10/2016 87  >=60 mL/min Final  Admission on 09/11/2016, Discharged on 09/11/2016  Component Date Value Ref Range Status  . Color, Urine 09/11/2016 STRAW* YELLOW Final  . APPearance 09/11/2016 CLEAR  CLEAR Final  . Specific Gravity, Urine 09/11/2016 1.011  1.005 - 1.030 Final  . pH 09/11/2016 6.0  5.0 - 8.0 Final  . Glucose, UA 09/11/2016 NEGATIVE  NEGATIVE mg/dL Final  . Hgb urine dipstick 09/11/2016 MODERATE* NEGATIVE Final  . Bilirubin Urine 09/11/2016 NEGATIVE  NEGATIVE Final  . Ketones, ur 09/11/2016 5* NEGATIVE mg/dL Final  . Protein, ur 09/11/2016 NEGATIVE  NEGATIVE mg/dL Final  . Nitrite 09/11/2016 NEGATIVE  NEGATIVE Final  . Leukocytes, UA 09/11/2016 NEGATIVE  NEGATIVE Final  . RBC / HPF 09/11/2016 6-30  0 - 5 RBC/hpf Final  . WBC, UA 09/11/2016 0-5  0 - 5 WBC/hpf Final  . Bacteria, UA 09/11/2016 NONE SEEN  NONE SEEN Final  . Squamous Epithelial / LPF 09/11/2016 0-5* NONE SEEN Final  . Sodium 09/11/2016 138  135 - 145 mmol/L Final  . Potassium 09/11/2016 3.6  3.5 - 5.1 mmol/L Final  . Chloride 09/11/2016 98* 101 - 111 mmol/L Final  . CO2 09/11/2016 27  22 - 32 mmol/L Final  . Glucose, Bld 09/11/2016 100* 65 - 99 mg/dL Final  . BUN 09/11/2016 22* 6 - 20 mg/dL Final  . Creatinine, Ser 09/11/2016 0.97  0.44 - 1.00 mg/dL Final  . Calcium 09/11/2016 9.9  8.9 - 10.3 mg/dL Final  . GFR calc non Af Amer 09/11/2016 >60  >60 mL/min Final  . GFR calc Af Amer 09/11/2016 >60  >60 mL/min Final   Comment: (NOTE) The eGFR has been calculated using the CKD EPI equation. This  calculation has not been validated in all clinical situations. eGFR's persistently <60 mL/min signify possible Chronic Kidney Disease.   . Anion gap 09/11/2016 13  5 - 15 Final  . WBC 09/11/2016 11.4* 4.0 - 10.5 K/uL Final  . RBC  09/11/2016 4.29  3.87 - 5.11 MIL/uL Final  . Hemoglobin 09/11/2016 13.8  12.0 - 15.0 g/dL Final  . HCT 09/11/2016 41.3  36.0 - 46.0 % Final  . MCV 09/11/2016 96.3  78.0 - 100.0 fL Final  . MCH 09/11/2016 32.2  26.0 - 34.0 pg Final  . MCHC 09/11/2016 33.4  30.0 - 36.0 g/dL Final  . RDW 09/11/2016 12.7  11.5 - 15.5 % Final  . Platelets 09/11/2016 314  150 - 400 K/uL Final  . Preg Test, Ur 09/11/2016 NEGATIVE  NEGATIVE Final   Comment:        THE SENSITIVITY OF THIS METHODOLOGY IS >20 mIU/mL.   Office Visit on 08/14/2016  Component Date Value Ref Range Status  . WBC 08/14/2016 6.4  3.8 - 10.8 K/uL Final  . RBC 08/14/2016 4.20  3.80 - 5.10 MIL/uL Final  . Hemoglobin 08/14/2016 13.5  11.7 - 15.5 g/dL Final  . HCT 08/14/2016 41.1  35.0 - 45.0 % Final  . MCV 08/14/2016 97.9  80.0 - 100.0 fL Final  . MCH 08/14/2016 32.1  27.0 - 33.0 pg Final  . MCHC 08/14/2016 32.8  32.0 - 36.0 g/dL Final  . RDW 08/14/2016 13.6  11.0 - 15.0 % Final  . Platelets 08/14/2016 297  140 - 400 K/uL Final  . MPV 08/14/2016 10.5  7.5 - 12.5 fL Final  . Neutro Abs 08/14/2016 3712  1,500 - 7,800 cells/uL Final  . Lymphs Abs 08/14/2016 2048  850 - 3,900 cells/uL Final  . Monocytes Absolute 08/14/2016 512  200 - 950 cells/uL Final  . Eosinophils Absolute 08/14/2016 64  15 - 500 cells/uL Final  . Basophils Absolute 08/14/2016 64  0 - 200 cells/uL Final  . Neutrophils Relative % 08/14/2016 58  % Final  . Lymphocytes Relative 08/14/2016 32  % Final  . Monocytes Relative 08/14/2016 8  % Final  . Eosinophils Relative 08/14/2016 1  % Final  . Basophils Relative 08/14/2016 1  % Final  . Smear Review 08/14/2016 Criteria for review not met   Final  . Sodium 08/14/2016 142  135 - 146 mmol/L Final  . Potassium 08/14/2016 4.6  3.5 - 5.3 mmol/L Final  . Chloride 08/14/2016 104  98 - 110 mmol/L Final  . CO2 08/14/2016 27  20 - 31 mmol/L Final  . Glucose, Bld 08/14/2016 100* 65 - 99 mg/dL Final  . BUN 08/14/2016 17  7 - 25  mg/dL Final  . Creat 08/14/2016 0.80  0.50 - 1.05 mg/dL Final   Comment:   For patients > or = 50 years of age: The upper reference limit for Creatinine is approximately 13% higher for people identified as African-American.     . Total Bilirubin 08/14/2016 0.3  0.2 - 1.2 mg/dL Final  . Alkaline Phosphatase 08/14/2016 50  33 - 130 U/L Final  . AST 08/14/2016 18  10 - 35 U/L Final  . ALT 08/14/2016 15  6 - 29 U/L Final  . Total Protein 08/14/2016 7.3  6.1 - 8.1 g/dL Final  . Albumin 08/14/2016 4.1  3.6 - 5.1 g/dL Final  . Calcium 08/14/2016 9.4  8.6 - 10.4 mg/dL Final  . GFR, Est African American 08/14/2016 >89  >=60 mL/min  Final  . GFR, Est Non African American 08/14/2016 86  >=60 mL/min Final     Imaging: No results found.      Speciality Comments: No specialty comments available.    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 Patient gets annual chest x-ray and is due for repeat chest x-ray July 2018. Patient states that she was told by Dr. DEstanislado Pandythat since she is only on [place] she does not need annual chest x-ray at this time.  #3: High risk prescription Otezla 30 mg twice a day Methotrexate subcutaneous 120mq week Folic acid 2 mg daily Adequate response  #4: Return to clinic 5 months  #5: Psoriasis is well controlled everywhere except for the plantar  aspect of bilateral feet. We discussed using clobetasol ointment and patient is agreeable. She will use it sparingly.  #6: CBC with differential and CMP with GFR today  #7: Since patient is getting adequate response at this time, in the future visit, we will consider decreasing her methotrexate to 0.8 ML/week  Orders: No orders of the defined types were placed in this encounter.  No orders of the defined types were placed in this encounter.   Face-to-face time spent with patient was 30 minutes. 50% of time was spent in counseling and coordination of care.  Follow-Up Instructions: No Follow-up on file.   NaEliezer LoftsPA-C  Patient had no synovitis on examination today. She had plantar psoriasis . We will try topical therapy for right now. I examined and evaluated the patient with NaEliezer LoftsA. The plan of care was discussed as noted above.  ShBo MerinoMD  Note - This record has been created using DrEditor, commissioning Chart creation errors have been sought, but may not always  have been located. Such creation errors do not reflect on  the standard of medical care.

## 2017-01-08 ENCOUNTER — Other Ambulatory Visit (HOSPITAL_COMMUNITY): Payer: Self-pay

## 2017-01-08 ENCOUNTER — Encounter (HOSPITAL_COMMUNITY): Payer: Self-pay

## 2017-01-08 NOTE — Patient Instructions (Addendum)
MADALAINE PORTIER  01/08/2017   Your procedure is scheduled on: 01/12/17  Report to [facility] Main  Entrance Take [place]  elevators to 3rd floor to  [place] at     [phone].    Call this number if you have problems the morning of surgery [phone]   Remember: ONLY 1 PERSON MAY GO WITH YOU TO SHORT STAY TO GET  READY MORNING OF YOUR SURGERY.  Do not eat food or drink liquids :After Midnight.     Take these medicines the morning of surgery with A SIP OF WATER: zoloft, ranitidine, prevastatin, claritin, synthroid, flonase                                You may not have any metal on your body including hair pins and              piercings  Do not wear jewelry, make-up, lotions, powders or perfumes, deodorant             Do not wear nail polish.  Do not shave  48 hours prior to surgery.               Do not bring valuables to the hospital. [place].  Contacts, dentures or bridgework may not be worn into surgery.  Leave suitcase in the car. After surgery it may be brought to your room.                  Please read over the following fact sheets you were given: _____________________________________________________________________             [facility] - Preparing for Surgery Before surgery, you can play an important role.  Because skin is not sterile, your skin needs to be as free of germs as possible.  You can reduce the number of germs on your skin by washing with CHG (chlorahexidine gluconate) soap before surgery.  CHG is an antiseptic cleaner which kills germs and bonds with the skin to continue killing germs even after washing. Please DO NOT use if you have an allergy to CHG or antibacterial soaps.  If your skin becomes reddened/irritated stop using the CHG and inform your nurse when you arrive at Short Stay. Do not shave (including legs and underarms) for at least 48 hours prior to the first CHG  shower.  You may shave your face/neck. Please follow these instructions carefully:  1.  Shower with CHG Soap the night before surgery and the  morning of Surgery.  2.  If you choose to wash your hair, wash your hair first as usual with your  normal  shampoo.  3.  After you shampoo, rinse your hair and body thoroughly to remove the  shampoo.                           4.  Use CHG as you would any other liquid soap.  You can apply chg directly  to the skin and wash                       Gently with a scrungie or clean washcloth.  5.  Apply  the CHG Soap to your body ONLY FROM THE NECK DOWN.   Do not use on face/ open                           Wound or open sores. Avoid contact with eyes, ears mouth and genitals (private parts).                       Wash face,  Genitals (private parts) with your normal soap.             6.  Wash thoroughly, paying special attention to the area where your surgery  will be performed.  7.  Thoroughly rinse your body with warm water from the neck down.  8.  DO NOT shower/wash with your normal soap after using and rinsing off  the CHG Soap.                9.  Pat yourself dry with a clean towel.            10.  Wear clean pajamas.            11.  Place clean sheets on your bed the night of your first shower and do not  sleep with pets. Day of Surgery : Do not apply any lotions/deodorants the morning of surgery.  Please wear clean clothes to the hospital/surgery center.  FAILURE TO FOLLOW THESE INSTRUCTIONS MAY RESULT IN THE CANCELLATION OF YOUR SURGERY PATIENT SIGNATURE_________________________________  NURSE SIGNATURE__________________________________  ________________________________________________________________________

## 2017-01-08 NOTE — Progress Notes (Signed)
Please place orders in epic pt. Has preop apt. 01/09/17 Thank you very much

## 2017-01-09 ENCOUNTER — Encounter (HOSPITAL_COMMUNITY)
Admission: RE | Admit: 2017-01-09 | Discharge: 2017-01-09 | Disposition: A | Discharge status: Home / Self Care | Payer: Medicare HMO | Source: Ambulatory Visit | Attending: Surgery | Admitting: Surgery

## 2017-01-09 ENCOUNTER — Encounter (HOSPITAL_COMMUNITY): Payer: Self-pay

## 2017-01-09 ENCOUNTER — Ambulatory Visit (INDEPENDENT_AMBULATORY_CARE_PROVIDER_SITE_OTHER): Payer: Medicare HMO | Admitting: Rheumatology

## 2017-01-09 ENCOUNTER — Encounter: Payer: Self-pay | Admitting: Rheumatology

## 2017-01-09 VITALS — BP 131/73 | HR 73 | Resp 14 | Ht 66.0 in | Wt 201.0 lb

## 2017-01-09 DIAGNOSIS — I10 Essential (primary) hypertension: Secondary | ICD-10-CM | POA: Diagnosis not present

## 2017-01-09 DIAGNOSIS — L408 Other psoriasis: Secondary | ICD-10-CM | POA: Diagnosis not present

## 2017-01-09 DIAGNOSIS — Z01818 Encounter for other preprocedural examination: Secondary | ICD-10-CM

## 2017-01-09 DIAGNOSIS — R1310 Dysphagia, unspecified: Secondary | ICD-10-CM

## 2017-01-09 DIAGNOSIS — K219 Gastro-esophageal reflux disease without esophagitis: Secondary | ICD-10-CM | POA: Diagnosis not present

## 2017-01-09 DIAGNOSIS — L4050 Arthropathic psoriasis, unspecified: Secondary | ICD-10-CM | POA: Diagnosis not present

## 2017-01-09 DIAGNOSIS — K660 Peritoneal adhesions (postprocedural) (postinfection): Secondary | ICD-10-CM | POA: Diagnosis not present

## 2017-01-09 DIAGNOSIS — Z91040 Latex allergy status: Secondary | ICD-10-CM | POA: Diagnosis not present

## 2017-01-09 DIAGNOSIS — R7611 Nonspecific reaction to tuberculin skin test without active tuberculosis: Secondary | ICD-10-CM

## 2017-01-09 DIAGNOSIS — E7800 Pure hypercholesterolemia, unspecified: Secondary | ICD-10-CM | POA: Diagnosis not present

## 2017-01-09 DIAGNOSIS — M1990 Unspecified osteoarthritis, unspecified site: Secondary | ICD-10-CM | POA: Diagnosis not present

## 2017-01-09 DIAGNOSIS — Z79899 Other long term (current) drug therapy: Secondary | ICD-10-CM

## 2017-01-09 DIAGNOSIS — Z8249 Family history of ischemic heart disease and other diseases of the circulatory system: Secondary | ICD-10-CM | POA: Diagnosis not present

## 2017-01-09 DIAGNOSIS — K5190 Ulcerative colitis, unspecified, without complications: Secondary | ICD-10-CM | POA: Diagnosis not present

## 2017-01-09 DIAGNOSIS — K449 Diaphragmatic hernia without obstruction or gangrene: Secondary | ICD-10-CM | POA: Diagnosis not present

## 2017-01-09 DIAGNOSIS — Z87891 Personal history of nicotine dependence: Secondary | ICD-10-CM | POA: Diagnosis not present

## 2017-01-09 DIAGNOSIS — Z881 Allergy status to other antibiotic agents status: Secondary | ICD-10-CM | POA: Diagnosis not present

## 2017-01-09 DIAGNOSIS — R1319 Other dysphagia: Secondary | ICD-10-CM | POA: Diagnosis not present

## 2017-01-09 DIAGNOSIS — Z882 Allergy status to sulfonamides status: Secondary | ICD-10-CM | POA: Diagnosis not present

## 2017-01-09 DIAGNOSIS — Z888 Allergy status to other drugs, medicaments and biological substances status: Secondary | ICD-10-CM | POA: Diagnosis not present

## 2017-01-09 DIAGNOSIS — E039 Hypothyroidism, unspecified: Secondary | ICD-10-CM | POA: Diagnosis not present

## 2017-01-09 HISTORY — DX: Nausea with vomiting, unspecified: R11.2

## 2017-01-09 HISTORY — DX: Personal history of urinary calculi: Z87.442

## 2017-01-09 HISTORY — DX: Personal history of other diseases of the digestive system: Z87.19

## 2017-01-09 HISTORY — DX: Depression, unspecified: F32.A

## 2017-01-09 HISTORY — DX: Other specified postprocedural states: Z98.890

## 2017-01-09 HISTORY — DX: Major depressive disorder, single episode, unspecified: F32.9

## 2017-01-09 LAB — CBC WITH DIFFERENTIAL/PLATELET
Basophils Absolute: 0 cells/uL (ref 0–200)
Basophils Relative: 0 %
Eosinophils Absolute: 89 cells/uL (ref 15–500)
Eosinophils Relative: 1 %
HCT: 39.8 % (ref 35.0–45.0)
Hemoglobin: 13.1 g/dL (ref 11.7–15.5)
Lymphocytes Relative: 18 %
Lymphs Abs: 1602 cells/uL (ref 850–3900)
MCH: 31.8 pg (ref 27.0–33.0)
MCHC: 32.9 g/dL (ref 32.0–36.0)
MCV: 96.6 fL (ref 80.0–100.0)
MPV: 9.8 fL (ref 7.5–12.5)
Monocytes Absolute: 623 cells/uL (ref 200–950)
Monocytes Relative: 7 %
Neutro Abs: 6586 cells/uL (ref 1500–7800)
Neutrophils Relative %: 74 %
Platelets: 278 10*3/uL (ref 140–400)
RBC: 4.12 MIL/uL (ref 3.80–5.10)
RDW: 14.4 % (ref 11.0–15.0)
WBC: 8.9 10*3/uL (ref 3.8–10.8)

## 2017-01-09 MED ORDER — CLOBETASOL PROPIONATE 0.05 % EX OINT: 1.0000 "application " | TOPICAL_OINTMENT | Freq: Two times a day (BID) | CUTANEOUS | 1 refills | Status: DC

## 2017-01-10 ENCOUNTER — Ambulatory Visit: Payer: Self-pay | Admitting: Surgery

## 2017-01-10 LAB — COMPLETE METABOLIC PANEL WITH GFR
ALT: 18 U/L (ref 6–29)
AST: 23 U/L (ref 10–35)
Albumin: 4 g/dL (ref 3.6–5.1)
Alkaline Phosphatase: 65 U/L (ref 33–130)
BUN: 12 mg/dL (ref 7–25)
CO2: 28 mmol/L (ref 20–31)
Calcium: 9.2 mg/dL (ref 8.6–10.4)
Chloride: 101 mmol/L (ref 98–110)
Creat: 0.79 mg/dL (ref 0.50–1.05)
GFR, Est African American: >89 mL/min (ref >=60)
GFR, Est Non African American: 87 mL/min (ref >=60)
Glucose, Bld: 87 mg/dL (ref 65–99)
Potassium: 4.7 mmol/L (ref 3.5–5.3)
Sodium: 137 mmol/L (ref 135–146)
Total Bilirubin: 0.5 mg/dL (ref 0.2–1.2)
Total Protein: 7.2 g/dL (ref 6.1–8.1)

## 2017-01-11 NOTE — H&P (Signed)
[name] 11/09/2016 3:31 PM Location: [place] Office Patient #: [id number] DOB: [birth year] Married / Language: [name] / Race: White Female   History of Present Illness Carla Key B. Hassell Done MD; 11/09/2016 4:29 PM) The patient is a 51 year old female who presents with a hiatal hernia. Had repair by [name] in 2001. She has had difficulty vomiting since and some dysphagia. Had dilatation to #52 by [name] in 2011. No long term relief. Followed by [name] for UC and has psoriasis.   I discussed laparoscopy and takedown Nissen to a 270 degree wrap. Will schedule at [facility]   Past Surgical History [name], CMA; 11/09/2016 3:31 PM) Hysterectomy (not due to cancer) - Partial  Knee Surgery  Left.  Diagnostic Studies History [name], CMA; 11/09/2016 3:31 PM) Colonoscopy  1-5 years ago Mammogram  within last year Pap Smear  1-5 years ago  Allergies [name], CMA; 11/09/2016 3:37 PM) Doxycycline Hyclate *TETRACYCLINES*  Imuran *MISCELLANEOUS THERAPEUTIC CLASSES*  Meperidine HCl *ANALGESICS - OPIOID*  Remicade *GASTROINTESTINAL AGENTS - MISC.*  Sulfa Antibiotics  Avelox *FLUOROQUINOLONES*  Erythromycin *MACROLIDES*  Latex   Medication History ([name], CMA; 11/09/2016 3:39 PM) Calcium (1500MG Tablet, Oral) Active. ClonazePAM (1MG Tablet, Oral) Active. Estradiol (1MG Tablet, Oral) Active. Folic Acid (1MG Tablet, Oral) Active. Levothyroxine Sodium (125MCG Tablet, Oral) Active. Methotrexate Sodium (250MG/10ML Solution, Injection) Active. Multivitamin Adult (Oral) Active. Omeprazole (20MG Capsule DR, Oral) Active. Pravastatin Sodium (40MG Tablet, Oral) Active. Sertraline HCl (100MG Tablet, Oral) Active. Stelara (45MG/0.5ML Solution, Subcutaneous) Active. Medications Reconciled  Social History [name], CMA; 11/09/2016 3:31 PM) Alcohol use  Occasional alcohol use. Caffeine use  Coffee. No drug use  Tobacco use  Former  smoker.  Family History [name], CMA; 11/09/2016 3:31 PM) Alcohol Abuse  Father. Arthritis  Father. Breast Cancer  Family Members In General, Mother. Cerebrovascular Accident  Father. Colon Cancer  Mother. Heart Disease  Father, Mother. Heart disease in female family member before age 51  Heart disease in female family member before age 89  Hypertension  Father, Mother. Migraine Headache  Mother. Respiratory Condition  Father.  Pregnancy / Birth History [name], CMA; 11/09/2016 3:31 PM) Age at menarche  64 years. Age of menopause  <45 Contraceptive History  Oral contraceptives. Gravida  2 Maternal age  36-20 Para  2  Other Problems [name], CMA; 11/09/2016 3:31 PM) Depression  Gastroesophageal Reflux Disease  High blood pressure  Hypercholesterolemia  Pancreatitis  Thyroid Disease  Ulcerative Colitis     Review of Systems ([name] CMA; 11/09/2016 3:31 PM) General Present- Weight Gain. Not Present- Appetite Loss, Chills, Fatigue, Fever, Night Sweats and Weight Loss. Skin Not Present- Change in Wart/Mole, Dryness, Hives, Jaundice, New Lesions, Non-Healing Wounds, Rash and Ulcer. HEENT Present- Seasonal Allergies. Not Present- Earache, Hearing Loss, Hoarseness, Nose Bleed, Oral Ulcers, Ringing in the Ears, Sinus Pain, Sore Throat, Visual Disturbances, Wears glasses/contact lenses and Yellow Eyes. Respiratory Not Present- Bloody sputum, Chronic Cough, Difficulty Breathing, Snoring and Wheezing. Breast Not Present- Breast Mass, Breast Pain, Nipple Discharge and Skin Changes. Cardiovascular Not Present- Chest Pain, Difficulty Breathing Lying Down, Leg Cramps, Palpitations, Rapid Heart Rate, Shortness of Breath and Swelling of Extremities. Gastrointestinal Not Present- Abdominal Pain, Bloating, Bloody Stool, Change in Bowel Habits, Chronic diarrhea, Constipation, Difficulty Swallowing, Excessive gas, Gets full quickly at meals,  Hemorrhoids, Indigestion, Nausea, Rectal Pain and Vomiting. Female Genitourinary Not Present- Frequency, Nocturia, Painful Urination, Pelvic Pain and Urgency. Musculoskeletal Not Present- Back Pain, Joint Pain, Joint Stiffness, Muscle  Pain, Muscle Weakness and Swelling of Extremities. Neurological Not Present- Decreased Memory, Fainting, Headaches, Numbness, Seizures, Tingling, Tremor, Trouble walking and Weakness. Psychiatric Not Present- Anxiety, Bipolar, Change in Sleep Pattern, Depression, Fearful and Frequent crying. Endocrine Not Present- Cold Intolerance, Excessive Hunger, Hair Changes, Heat Intolerance, Hot flashes and New Diabetes. Hematology Not Present- Blood Thinners, Easy Bruising, Excessive bleeding, Gland problems, HIV and Persistent Infections.  Vitals ([name] CMA; 11/09/2016 3:32 PM) 11/09/2016 3:31 PM Weight: 196.6 lb Temp.: 98.25F(Oral)  Pulse: 98 (Regular)  BP: 130/80 (Sitting, Left Arm, Standard)       Physical Exam (Carla Horn B. Hassell Done MD; 11/09/2016 4:30 PM) General Note: [id number] HEENT unremarkable Neck supple Chest clear Heart SR Abdomen scars from lap Nissen Ext psoriasis on feet     Assessment & Plan Carla Key B. Hassell Done MD; 11/09/2016 4:32 PM) MECHANICAL DYSPHAGIA (R13.19) Impression: Related to prior Nissen fundoplication. Will schedule takedown partially of Nissen  [name]

## 2017-01-11 NOTE — Anesthesia Preprocedure Evaluation (Addendum)
Anesthesia Evaluation  Patient identified by MRN, date of birth, ID band Patient awake    Reviewed: Allergy & Precautions, NPO status , Patient's Chart, lab work & pertinent test results  History of Anesthesia Complications (+) PONV  Airway Mallampati: II  TM Distance: >3 FB Neck ROM: Full    Dental  (+) Dental Advisory Given   Pulmonary former smoker,    breath sounds clear to auscultation       Cardiovascular hypertension, Pt. on medications  Rhythm:Regular Rate:Normal     Neuro/Psych Depression negative neurological ROS     GI/Hepatic Neg liver ROS, hiatal hernia, GERD  ,+UC   Endo/Other  Hypothyroidism   Renal/GU negative Renal ROS     Musculoskeletal  (+) Arthritis ,   Abdominal   Peds  Hematology negative hematology ROS (+)   Anesthesia Other Findings   Reproductive/Obstetrics                            Lab Results  Component Value Date   WBC 8.9 01/09/2017   HGB 13.1 01/09/2017   HCT 39.8 01/09/2017   MCV 96.6 01/09/2017   PLT 278 01/09/2017   Lab Results  Component Value Date   CREATININE 0.79 01/09/2017   BUN 12 01/09/2017   NA 137 01/09/2017   K 4.7 01/09/2017   CL 101 01/09/2017   CO2 28 01/09/2017    Anesthesia Physical Anesthesia Plan  ASA: II  Anesthesia Plan: General   Post-op Pain Management:    Induction: Intravenous  PONV Risk Score and Plan: 4 or greater and Ondansetron, Dexamethasone, Propofol, Midazolam and Scopolamine patch - Pre-op  Airway Management Planned: Oral ETT  Additional Equipment:   Intra-op Plan:   Post-operative Plan: Extubation in OR  Informed Consent: I have reviewed the patients History and Physical, chart, labs and discussed the procedure including the risks, benefits and alternatives for the proposed anesthesia with the patient or authorized representative who has indicated his/her understanding and acceptance.    Dental advisory given  Plan Discussed with: CRNA  Anesthesia Plan Comments:        Anesthesia Quick Evaluation

## 2017-01-12 ENCOUNTER — Ambulatory Visit (HOSPITAL_COMMUNITY): Payer: Medicare HMO | Admitting: Anesthesiology

## 2017-01-12 ENCOUNTER — Encounter (HOSPITAL_COMMUNITY): Payer: Self-pay | Admitting: *Deleted

## 2017-01-12 ENCOUNTER — Observation Stay (HOSPITAL_COMMUNITY)
Admission: RE | Admit: 2017-01-12 | Discharge: 2017-01-13 | Disposition: A | Discharge status: Home / Self Care | Payer: Medicare HMO | Source: Ambulatory Visit | Attending: Surgery | Admitting: Surgery

## 2017-01-12 ENCOUNTER — Encounter (HOSPITAL_COMMUNITY)
Admission: RE | Disposition: A | Discharge status: Home / Self Care | Payer: Self-pay | Source: Ambulatory Visit | Attending: Surgery

## 2017-01-12 DIAGNOSIS — M1990 Unspecified osteoarthritis, unspecified site: Secondary | ICD-10-CM | POA: Diagnosis not present

## 2017-01-12 DIAGNOSIS — K449 Diaphragmatic hernia without obstruction or gangrene: Secondary | ICD-10-CM | POA: Diagnosis not present

## 2017-01-12 DIAGNOSIS — K219 Gastro-esophageal reflux disease without esophagitis: Secondary | ICD-10-CM | POA: Insufficient documentation

## 2017-01-12 DIAGNOSIS — I10 Essential (primary) hypertension: Secondary | ICD-10-CM | POA: Insufficient documentation

## 2017-01-12 DIAGNOSIS — K660 Peritoneal adhesions (postprocedural) (postinfection): Secondary | ICD-10-CM | POA: Insufficient documentation

## 2017-01-12 DIAGNOSIS — K5190 Ulcerative colitis, unspecified, without complications: Secondary | ICD-10-CM | POA: Insufficient documentation

## 2017-01-12 DIAGNOSIS — E039 Hypothyroidism, unspecified: Secondary | ICD-10-CM | POA: Insufficient documentation

## 2017-01-12 DIAGNOSIS — Z8249 Family history of ischemic heart disease and other diseases of the circulatory system: Secondary | ICD-10-CM | POA: Insufficient documentation

## 2017-01-12 DIAGNOSIS — K661 Hemoperitoneum: Secondary | ICD-10-CM | POA: Diagnosis not present

## 2017-01-12 DIAGNOSIS — E7800 Pure hypercholesterolemia, unspecified: Secondary | ICD-10-CM | POA: Diagnosis not present

## 2017-01-12 DIAGNOSIS — Z79899 Other long term (current) drug therapy: Secondary | ICD-10-CM | POA: Insufficient documentation

## 2017-01-12 DIAGNOSIS — R1310 Dysphagia, unspecified: Secondary | ICD-10-CM | POA: Diagnosis not present

## 2017-01-12 DIAGNOSIS — Z882 Allergy status to sulfonamides status: Secondary | ICD-10-CM | POA: Insufficient documentation

## 2017-01-12 DIAGNOSIS — R1319 Other dysphagia: Principal | ICD-10-CM | POA: Insufficient documentation

## 2017-01-12 DIAGNOSIS — Z87891 Personal history of nicotine dependence: Secondary | ICD-10-CM | POA: Insufficient documentation

## 2017-01-12 DIAGNOSIS — Z881 Allergy status to other antibiotic agents status: Secondary | ICD-10-CM | POA: Insufficient documentation

## 2017-01-12 DIAGNOSIS — E785 Hyperlipidemia, unspecified: Secondary | ICD-10-CM | POA: Diagnosis not present

## 2017-01-12 DIAGNOSIS — Z91040 Latex allergy status: Secondary | ICD-10-CM | POA: Insufficient documentation

## 2017-01-12 DIAGNOSIS — Z888 Allergy status to other drugs, medicaments and biological substances status: Secondary | ICD-10-CM | POA: Insufficient documentation

## 2017-01-12 HISTORY — PX: UPPER GI ENDOSCOPY: SHX6162

## 2017-01-12 HISTORY — PX: LAPAROSCOPIC NISSEN FUNDOPLICATION: SHX1932

## 2017-01-12 LAB — CBC
HCT: 37.2 % (ref 36.0–46.0)
Hemoglobin: 12.4 g/dL (ref 12.0–15.0)
MCH: 32.5 pg (ref 26.0–34.0)
MCHC: 33.3 g/dL (ref 30.0–36.0)
MCV: 97.6 fL (ref 78.0–100.0)
Platelets: 258 10*3/uL (ref 150–400)
RBC: 3.81 MIL/uL — ABNORMAL LOW (ref 3.87–5.11)
RDW: 13.6 % (ref 11.5–15.5)
WBC: 11.6 10*3/uL — ABNORMAL HIGH (ref 4.0–10.5)

## 2017-01-12 LAB — CREATININE, SERUM
Creatinine, Ser: 0.88 mg/dL (ref 0.44–1.00)
GFR calc Af Amer: >60 mL/min (ref >60)
GFR calc non Af Amer: >60 mL/min (ref >60)

## 2017-01-12 SURGERY — FUNDOPLICATION, NISSEN, LAPAROSCOPIC
Anesthesia: General | Site: Abdomen

## 2017-01-12 MED ORDER — HYDROMORPHONE HCL 1 MG/ML IJ SOLN
0.2500 mg | INTRAMUSCULAR | Status: DC | PRN
  Administered 2017-01-12 (×3): 0.5 mg via INTRAVENOUS

## 2017-01-12 MED ORDER — LACTATED RINGERS IV SOLN
INTRAVENOUS | Status: DC | PRN
  Administered 2017-01-12 (×2): via INTRAVENOUS

## 2017-01-12 MED ORDER — DEXAMETHASONE SODIUM PHOSPHATE 10 MG/ML IJ SOLN
INTRAMUSCULAR | Status: DC | PRN
  Administered 2017-01-12: 10 mg via INTRAVENOUS

## 2017-01-12 MED ORDER — ROCURONIUM BROMIDE 10 MG/ML (PF) SYRINGE
PREFILLED_SYRINGE | INTRAVENOUS | Status: DC | PRN
  Administered 2017-01-12: 20 mg via INTRAVENOUS
  Administered 2017-01-12: 50 mg via INTRAVENOUS
  Administered 2017-01-12: 10 mg via INTRAVENOUS

## 2017-01-12 MED ORDER — EPHEDRINE SULFATE-NACL 50-0.9 MG/10ML-% IV SOSY
PREFILLED_SYRINGE | INTRAVENOUS | Status: DC | PRN
  Administered 2017-01-12 (×3): 10 mg via INTRAVENOUS

## 2017-01-12 MED ORDER — SUFENTANIL CITRATE 50 MCG/ML IV SOLN
INTRAVENOUS | Status: AC
  Filled 2017-01-12: qty 1

## 2017-01-12 MED ORDER — PROPOFOL 10 MG/ML IV BOLUS
INTRAVENOUS | Status: DC | PRN
  Administered 2017-01-12: 150 mg via INTRAVENOUS
  Administered 2017-01-12: 50 mg via INTRAVENOUS

## 2017-01-12 MED ORDER — CEFAZOLIN SODIUM-DEXTROSE 2-4 GM/100ML-% IV SOLN
2.0000 g | Freq: Three times a day (TID) | INTRAVENOUS | Status: AC
  Administered 2017-01-12: 2 g via INTRAVENOUS
  Filled 2017-01-12 (×2): qty 100

## 2017-01-12 MED ORDER — ONDANSETRON 4 MG PO TBDP: 4.0000 mg | ORAL_TABLET | Freq: Four times a day (QID) | ORAL | Status: DC | PRN

## 2017-01-12 MED ORDER — CHLORHEXIDINE GLUCONATE CLOTH 2 % EX PADS: 6.0000 | MEDICATED_PAD | Freq: Once | CUTANEOUS | Status: DC

## 2017-01-12 MED ORDER — KCL IN DEXTROSE-NACL 20-5-0.45 MEQ/L-%-% IV SOLN
INTRAVENOUS | Status: DC
  Administered 2017-01-12: 15:00:00 via INTRAVENOUS
  Administered 2017-01-13: 100 mL/h via INTRAVENOUS
  Filled 2017-01-12 (×3): qty 1000

## 2017-01-12 MED ORDER — GABAPENTIN 300 MG PO CAPS
300.0000 mg | ORAL_CAPSULE | ORAL | Status: AC
  Administered 2017-01-12: 300 mg via ORAL
  Filled 2017-01-12: qty 1

## 2017-01-12 MED ORDER — LIDOCAINE 2% (20 MG/ML) 5 ML SYRINGE
INTRAMUSCULAR | Status: AC
  Filled 2017-01-12: qty 5

## 2017-01-12 MED ORDER — PANTOPRAZOLE SODIUM 40 MG IV SOLR
40.0000 mg | Freq: Every day | INTRAVENOUS | Status: DC
  Administered 2017-01-12: 40 mg via INTRAVENOUS
  Filled 2017-01-12: qty 40

## 2017-01-12 MED ORDER — SODIUM CHLORIDE 0.9 % IJ SOLN
INTRAMUSCULAR | Status: AC
Start: 2017-01-12 — End: 2017-01-12
  Filled 2017-01-12: qty 10

## 2017-01-12 MED ORDER — SUCCINYLCHOLINE CHLORIDE 200 MG/10ML IV SOSY
PREFILLED_SYRINGE | INTRAVENOUS | Status: AC
  Filled 2017-01-12: qty 10

## 2017-01-12 MED ORDER — MIDAZOLAM HCL 2 MG/2ML IJ SOLN
INTRAMUSCULAR | Status: AC
  Filled 2017-01-12: qty 2

## 2017-01-12 MED ORDER — HEPARIN SODIUM (PORCINE) 5000 UNIT/ML IJ SOLN
5000.0000 [IU] | Freq: Three times a day (TID) | INTRAMUSCULAR | Status: DC
  Administered 2017-01-12 – 2017-01-13 (×2): 5000 [IU] via SUBCUTANEOUS
  Filled 2017-01-12 (×2): qty 1

## 2017-01-12 MED ORDER — SODIUM CHLORIDE 0.9 % IJ SOLN
INTRAMUSCULAR | Status: AC
Start: 2017-01-12 — End: 2017-01-12
  Filled 2017-01-12: qty 50

## 2017-01-12 MED ORDER — SUFENTANIL CITRATE 50 MCG/ML IV SOLN
INTRAVENOUS | Status: DC | PRN
  Administered 2017-01-12: 10 ug via INTRAVENOUS
  Administered 2017-01-12: 20 ug via INTRAVENOUS
  Administered 2017-01-12 (×2): 10 ug via INTRAVENOUS

## 2017-01-12 MED ORDER — HEPARIN SODIUM (PORCINE) 5000 UNIT/ML IJ SOLN
5000.0000 [IU] | Freq: Once | INTRAMUSCULAR | Status: AC
Start: 2017-01-12 — End: 2017-01-12
  Administered 2017-01-12: 5000 [IU] via SUBCUTANEOUS
  Filled 2017-01-12: qty 1

## 2017-01-12 MED ORDER — SUGAMMADEX SODIUM 200 MG/2ML IV SOLN
INTRAVENOUS | Status: AC
Start: 2017-01-12 — End: 2017-01-12
  Filled 2017-01-12: qty 4

## 2017-01-12 MED ORDER — ONDANSETRON HCL 4 MG/2ML IJ SOLN
INTRAMUSCULAR | Status: AC
  Filled 2017-01-12: qty 2

## 2017-01-12 MED ORDER — HYDROMORPHONE HCL 1 MG/ML IJ SOLN
INTRAMUSCULAR | Status: AC
  Administered 2017-01-12: 0.5 mg via INTRAVENOUS
  Filled 2017-01-12: qty 2

## 2017-01-12 MED ORDER — CEFAZOLIN SODIUM-DEXTROSE 2-4 GM/100ML-% IV SOLN
INTRAVENOUS | Status: AC
  Filled 2017-01-12: qty 100

## 2017-01-12 MED ORDER — BUPIVACAINE LIPOSOME 1.3 % IJ SUSP
INTRAMUSCULAR | Status: DC | PRN
  Administered 2017-01-12: 20 mL

## 2017-01-12 MED ORDER — SUGAMMADEX SODIUM 200 MG/2ML IV SOLN
INTRAVENOUS | Status: DC | PRN
  Administered 2017-01-12: 200 mg via INTRAVENOUS

## 2017-01-12 MED ORDER — SCOPOLAMINE 1 MG/3DAYS TD PT72
MEDICATED_PATCH | TRANSDERMAL | Status: AC
  Filled 2017-01-12: qty 1

## 2017-01-12 MED ORDER — LIDOCAINE 2% (20 MG/ML) 5 ML SYRINGE
INTRAMUSCULAR | Status: DC | PRN
  Administered 2017-01-12: 100 mg via INTRAVENOUS

## 2017-01-12 MED ORDER — CEFAZOLIN SODIUM-DEXTROSE 2-4 GM/100ML-% IV SOLN
2.0000 g | INTRAVENOUS | Status: AC
  Administered 2017-01-12: 2 g via INTRAVENOUS

## 2017-01-12 MED ORDER — SUGAMMADEX SODIUM 200 MG/2ML IV SOLN
INTRAVENOUS | Status: AC
Start: 2017-01-12 — End: 2017-01-12
  Filled 2017-01-12: qty 2

## 2017-01-12 MED ORDER — SCOPOLAMINE 1 MG/3DAYS TD PT72
MEDICATED_PATCH | TRANSDERMAL | Status: DC | PRN
  Administered 2017-01-12: 1 via TRANSDERMAL

## 2017-01-12 MED ORDER — DEXAMETHASONE SODIUM PHOSPHATE 10 MG/ML IJ SOLN
INTRAMUSCULAR | Status: AC
  Filled 2017-01-12: qty 1

## 2017-01-12 MED ORDER — MIDAZOLAM HCL 2 MG/2ML IJ SOLN
INTRAMUSCULAR | Status: DC | PRN
  Administered 2017-01-12: 2 mg via INTRAVENOUS

## 2017-01-12 MED ORDER — MORPHINE SULFATE (PF) 10 MG/ML IV SOLN
1.0000 mg | INTRAVENOUS | Status: DC | PRN
  Administered 2017-01-12 (×3): 1 mg via INTRAVENOUS
  Filled 2017-01-12 (×3): qty 1

## 2017-01-12 MED ORDER — ACETAMINOPHEN 500 MG PO TABS
1000.0000 mg | ORAL_TABLET | ORAL | Status: AC
  Administered 2017-01-12: 1000 mg via ORAL
  Filled 2017-01-12: qty 2

## 2017-01-12 MED ORDER — PROPOFOL 10 MG/ML IV BOLUS
INTRAVENOUS | Status: AC
  Filled 2017-01-12: qty 20

## 2017-01-12 MED ORDER — PROMETHAZINE HCL 25 MG/ML IJ SOLN: 6.2500 mg | INTRAMUSCULAR | Status: DC | PRN

## 2017-01-12 MED ORDER — BUPIVACAINE LIPOSOME 1.3 % IJ SUSP
20.0000 mL | Freq: Once | INTRAMUSCULAR | Status: DC
  Filled 2017-01-12: qty 20

## 2017-01-12 MED ORDER — ONDANSETRON HCL 4 MG/2ML IJ SOLN: 4.0000 mg | Freq: Four times a day (QID) | INTRAMUSCULAR | Status: DC | PRN

## 2017-01-12 MED ORDER — ROCURONIUM BROMIDE 50 MG/5ML IV SOSY
PREFILLED_SYRINGE | INTRAVENOUS | Status: AC
  Filled 2017-01-12: qty 5

## 2017-01-12 SURGICAL SUPPLY — 47 items
ADH SKN CLS APL DERMABOND .7 (GAUZE/BANDAGES/DRESSINGS) ×2
APPLIER CLIP ROT 10 11.4 M/L (STAPLE)
APR CLP MED LRG 11.4X10 (STAPLE)
CABLE HIGH FREQUENCY MONO STRZ (ELECTRODE) ×3 IMPLANT
CLIP APPLIE ROT 10 11.4 M/L (STAPLE) IMPLANT
COVER SURGICAL LIGHT HANDLE (MISCELLANEOUS) ×3 IMPLANT
DECANTER SPIKE VIAL GLASS SM (MISCELLANEOUS) ×3 IMPLANT
DERMABOND ADVANCED (GAUZE/BANDAGES/DRESSINGS) ×1
DERMABOND ADVANCED .7 DNX12 (GAUZE/BANDAGES/DRESSINGS) IMPLANT
DEVICE SUT QUICK LOAD TK 5 (STAPLE) ×15 IMPLANT
DEVICE SUT TI-KNOT TK 5X26 (MISCELLANEOUS) ×3 IMPLANT
DEVICE SUTURE ENDOST 10MM (ENDOMECHANICALS) ×3 IMPLANT
DISSECTOR BLUNT TIP ENDO 5MM (MISCELLANEOUS) ×3 IMPLANT
DRAIN PENROSE 18X1/2 LTX STRL (DRAIN) ×3 IMPLANT
DRAPE LAPAROSCOPIC ABDOMINAL (DRAPES) ×3 IMPLANT
ELECT REM PT RETURN 15FT ADLT (MISCELLANEOUS) ×3 IMPLANT
GLOVE BIOGEL M 8.0 STRL (GLOVE) ×3 IMPLANT
GOWN STRL REUS W/TWL XL LVL3 (GOWN DISPOSABLE) ×9 IMPLANT
IRRIG SUCT STRYKERFLOW 2 WTIP (MISCELLANEOUS) ×3
IRRIGATION SUCT STRKRFLW 2 WTP (MISCELLANEOUS) ×2 IMPLANT
KIT BASIN OR (CUSTOM PROCEDURE TRAY) ×3 IMPLANT
PAD POSITIONING PINK XL (MISCELLANEOUS) ×1 IMPLANT
POSITIONER SURGICAL ARM (MISCELLANEOUS) IMPLANT
RELOAD ENDO STITCH (ENDOMECHANICALS) ×9 IMPLANT
RELOAD SUT TRIPLE-STITCH 2-0 (ENDOMECHANICALS) ×6 IMPLANT
SCISSORS LAP 5X45 EPIX DISP (ENDOMECHANICALS) ×3 IMPLANT
SHEARS HARMONIC ACE PLUS 45CM (MISCELLANEOUS) ×3 IMPLANT
SLEEVE ADV FIXATION 5X100MM (TROCAR) ×6 IMPLANT
SLEEVE XCEL OPT CAN 5 100 (ENDOMECHANICALS) ×1 IMPLANT
STAPLER VISISTAT 35W (STAPLE) ×3 IMPLANT
SUT MNCRL AB 4-0 PS2 18 (SUTURE) ×2 IMPLANT
SUT SURGIDAC NAB ES-9 0 48 120 (SUTURE) ×9 IMPLANT
SUT VIC AB 4-0 SH 18 (SUTURE) ×3 IMPLANT
TAPE CLOTH 4X10 WHT NS (GAUZE/BANDAGES/DRESSINGS) IMPLANT
TIP INNERVISION DETACH 40FR (MISCELLANEOUS) IMPLANT
TIP INNERVISION DETACH 50FR (MISCELLANEOUS) IMPLANT
TIP INNERVISION DETACH 56FR (MISCELLANEOUS) IMPLANT
TIPS INNERVISION DETACH 40FR (MISCELLANEOUS)
TOWEL OR 17X26 10 PK STRL BLUE (TOWEL DISPOSABLE) ×6 IMPLANT
TOWEL OR NON WOVEN STRL DISP B (DISPOSABLE) ×3 IMPLANT
TRAY FOLEY CATH SILVER 16FR LF (SET/KITS/TRAYS/PACK) ×1 IMPLANT
TRAY FOLEY W/METER SILVER 16FR (SET/KITS/TRAYS/PACK) IMPLANT
TRAY LAPAROSCOPIC (CUSTOM PROCEDURE TRAY) ×3 IMPLANT
TROCAR ADV FIXATION 11X100MM (TROCAR) ×3 IMPLANT
TROCAR ADV FIXATION 5X100MM (TROCAR) ×3 IMPLANT
TROCAR BLADELESS OPT 5 100 (ENDOMECHANICALS) ×3 IMPLANT
TUBING INSUF HEATED (TUBING) ×3 IMPLANT

## 2017-01-12 NOTE — Anesthesia Procedure Notes (Signed)
Procedure Name: Intubation Date/Time: 01/12/2017 7:47 AM Performed by: Danley Danker L Patient Re-evaluated:Patient Re-evaluated prior to inductionOxygen Delivery Method: Circle system utilized Preoxygenation: Pre-oxygenation with 100% oxygen Intubation Type: IV induction Ventilation: Mask ventilation without difficulty and Oral airway inserted - appropriate to patient size Laryngoscope Size: Sabra Heck and 2 Grade View: Grade I Tube type: Oral Tube size: 7.5 mm Number of attempts: 1 Airway Equipment and Method: Stylet Placement Confirmation: ETT inserted through vocal cords under direct vision,  positive ETCO2 and breath sounds checked- equal and bilateral Secured at: 21 cm Tube secured with: Tape Dental Injury: Teeth and Oropharynx as per pre-operative assessment

## 2017-01-12 NOTE — Interval H&P Note (Signed)
History and Physical Interval Note:  01/12/2017 7:22 AM  [name]  has presented today for surgery, with the diagnosis of Dysphagia  The various methods of treatment have been discussed with the patient and family. After consideration of risks, benefits and other options for treatment, the patient has consented to  Procedure(s): [place] (N/A) as a surgical intervention .  The patient's history has been reviewed, patient examined, no change in status, stable for surgery.  I have reviewed the patient's chart and labs.  Questions were answered to the patient's satisfaction.     [name]

## 2017-01-12 NOTE — Progress Notes (Addendum)
Order from Dr. Hassell Done to remove foley catheter and change diet to full liquid diet.  Foley was removed but not able to chart against it as it was not charted as being placed.

## 2017-01-12 NOTE — Op Note (Signed)
Surgeon: Kaylyn Lim, MD, FACS  Asst:  Romana Juniper, MD,  Anes:  general  Preop Dx: Dysphagia after Nissen fundoplication Postop Dx: same  Procedure: Lap takedown of Nissen and upper endoscopy (separate note) Location Surgery: [facility] Complications: None noted  EBL:   minimal cc  Drains: none  Description of Procedure:  The patient was taken to OR 4 .  After anesthesia was administered and the patient was prepped a timeout was performed.  Access to the abdomen was achieved with a 5 mm Optiview through the left upper quadrant.  The adhesions to the large falciform noted on Dr. Princess Perna op note from 2003 were taken down.  The Sherrie Sport was inserted and adhesions to the liver and stomach were taken down.    The wrap was noted at he EG junction.  The right crus was dissected as was the left crus.  The planes were developed between the wrapped stomach and I got under this and the three Ticrons were divided and the wrap fell back to a 270 degree.  I then performed endoscopy which did not show any evidence of a hiatal hernia or obstruction at the EG junction.    I enlarged the port above the umbilicus and felt the fascia and no hernia was noted.  Exparel was placed in the fascia (30 cc) and the wounds were closed with 4-0 monocryl.    The patient tolerated the procedure well and was taken to the PACU in stable condition.     Matt B. Hassell Done, [place], [facility], [place]

## 2017-01-12 NOTE — Anesthesia Postprocedure Evaluation (Signed)
Anesthesia Post Note  Patient: [name]  Procedure(s) Performed: Procedure(s) (LRB): LAPAROSCOPIC NISSEN TAKEDOWN AND UPPER ENDOSCOPY (N/A) UPPER GI ENDOSCOPY     Patient location during evaluation: PACU Anesthesia Type: General Level of consciousness: awake and alert Pain management: pain level controlled Vital Signs Assessment: post-procedure vital signs reviewed and stable Respiratory status: spontaneous breathing, nonlabored ventilation, respiratory function stable and patient connected to nasal cannula oxygen Cardiovascular status: blood pressure returned to baseline and stable Postop Assessment: no signs of nausea or vomiting Anesthetic complications: no    Last Vitals:  Vitals:   01/12/17 1130 01/12/17 1232  BP: 135/63 132/62  Pulse: (!) 108 (!) 104  Resp: 16 16  Temp: 36.7 C 36.7 C    Last Pain:  Vitals:   01/12/17 1246  TempSrc:   PainSc: 3                  Tiajuana Amass

## 2017-01-12 NOTE — Transfer of Care (Signed)
Immediate Anesthesia Transfer of Care Note  Patient: [name]  Procedure(s) Performed: Procedure(s): LAPAROSCOPIC NISSEN TAKEDOWN AND UPPER ENDOSCOPY (N/A) UPPER GI ENDOSCOPY  Patient Location: PACU  Anesthesia Type:General  Level of Consciousness: awake, alert  and oriented  Airway & Oxygen Therapy: Patient Spontanous Breathing and Patient connected to face mask oxygen  Post-op Assessment: Report given to RN and Post -op Vital signs reviewed and stable  Post vital signs: Reviewed and stable  Last Vitals:  Vitals:   01/12/17 0537  BP: 140/75  Pulse: 81  Resp: 16  Temp: 36.8 C    Last Pain:  Vitals:   01/12/17 0537  TempSrc: Oral      Patients Stated Pain Goal: 4 (64/40/34 7425)  Complications: No apparent anesthesia complications

## 2017-01-12 NOTE — Care Management Note (Signed)
Case Management Note  Patient Details  Name: VITTORIA NOREEN MRN: [id number] Date of Birth: 04/25/1966  Subjective/Objective:    Laparoscopic nissen takedown and upper Endoscopy                Action/Plan: Discharge Planning: Chart reviewed. No NCM needs identified. Will continue to follow for dc needs.   PCP Mayra Neer   Expected Discharge Date:  01/13/17               Expected Discharge Plan:  Home/Self Care  In-House Referral:  NA  Discharge planning Services  CM Consult  Post Acute Care Choice:  NA Choice offered to:  NA  DME Arranged:  N/A DME Agency:  NA  HH Arranged:  NA HH Agency:  NA  Status of Service:  Completed, signed off  If discussed at [place] of Stay Meetings, dates discussed:    Additional Comments:  Erenest Rasher, RN 01/12/2017, 3:54 PM

## 2017-01-12 NOTE — Op Note (Signed)
[name] [id number] August 12, 1965 01/12/2017  Preoperative diagnosis: dysphagia after Nissen in 2003  Postoperative diagnosis: Same   Procedure: Upper endoscopy   Surgeon: Catalina Antigua B. Hassell Done  M.D., FACS   Anesthesia: Gen.   Indications for procedure: This patient was undergoing a takedown of a Nissen fundoplication.  Look at EG junction.    Description of procedure: The endoscopy was placed in the mouth and into the oropharynx and under endoscopic vision it was advanced to the esophagogastric junction.  There was no obstruction at the EG junction and after the wrap had been taken down there was no obstruction of appreciable hiatal hernia.   No bleeding or leaks were detected.  The scope was withdrawn without difficulty.     Matt B. Hassell Done, MD, FACS General, Bariatric, & Minimally Invasive Surgery [facility], [place]

## 2017-01-13 ENCOUNTER — Encounter (HOSPITAL_COMMUNITY): Payer: Self-pay | Admitting: Surgery

## 2017-01-13 DIAGNOSIS — M1990 Unspecified osteoarthritis, unspecified site: Secondary | ICD-10-CM | POA: Diagnosis not present

## 2017-01-13 DIAGNOSIS — R1319 Other dysphagia: Secondary | ICD-10-CM | POA: Diagnosis not present

## 2017-01-13 DIAGNOSIS — I10 Essential (primary) hypertension: Secondary | ICD-10-CM | POA: Diagnosis not present

## 2017-01-13 DIAGNOSIS — K449 Diaphragmatic hernia without obstruction or gangrene: Secondary | ICD-10-CM | POA: Diagnosis not present

## 2017-01-13 DIAGNOSIS — E7800 Pure hypercholesterolemia, unspecified: Secondary | ICD-10-CM | POA: Diagnosis not present

## 2017-01-13 DIAGNOSIS — K5190 Ulcerative colitis, unspecified, without complications: Secondary | ICD-10-CM | POA: Diagnosis not present

## 2017-01-13 DIAGNOSIS — K660 Peritoneal adhesions (postprocedural) (postinfection): Secondary | ICD-10-CM | POA: Diagnosis not present

## 2017-01-13 DIAGNOSIS — E039 Hypothyroidism, unspecified: Secondary | ICD-10-CM | POA: Diagnosis not present

## 2017-01-13 DIAGNOSIS — K219 Gastro-esophageal reflux disease without esophagitis: Secondary | ICD-10-CM | POA: Diagnosis not present

## 2017-01-13 LAB — CBC
HCT: 33.9 % — ABNORMAL LOW (ref 36.0–46.0)
Hemoglobin: 11.1 g/dL — ABNORMAL LOW (ref 12.0–15.0)
MCH: 32.1 pg (ref 26.0–34.0)
MCHC: 32.7 g/dL (ref 30.0–36.0)
MCV: 98 fL (ref 78.0–100.0)
Platelets: 253 10*3/uL (ref 150–400)
RBC: 3.46 MIL/uL — ABNORMAL LOW (ref 3.87–5.11)
RDW: 13.8 % (ref 11.5–15.5)
WBC: 11.2 10*3/uL — ABNORMAL HIGH (ref 4.0–10.5)

## 2017-01-13 LAB — BASIC METABOLIC PANEL
Anion gap: 7 (ref 5–15)
BUN: 10 mg/dL (ref 6–20)
CO2: 30 mmol/L (ref 22–32)
Calcium: 8.7 mg/dL — ABNORMAL LOW (ref 8.9–10.3)
Chloride: 102 mmol/L (ref 101–111)
Creatinine, Ser: 0.82 mg/dL (ref 0.44–1.00)
GFR calc Af Amer: >60 mL/min (ref >60)
GFR calc non Af Amer: >60 mL/min (ref >60)
Glucose, Bld: 112 mg/dL — ABNORMAL HIGH (ref 65–99)
Potassium: 4 mmol/L (ref 3.5–5.1)
Sodium: 139 mmol/L (ref 135–145)

## 2017-01-13 MED ORDER — MORPHINE SULFATE (PF) 2 MG/ML IV SOLN
1.0000 mg | INTRAVENOUS | Status: DC | PRN
  Administered 2017-01-13: 1 mg via INTRAVENOUS
  Filled 2017-01-13: qty 1

## 2017-01-13 NOTE — Discharge Instructions (Signed)
If you have difficulty with food hanging up, then stand and take deep breaths and let gravity help get the food bolus into your stomach.

## 2017-01-13 NOTE — Discharge Summary (Signed)
Physician Discharge Summary  Patient ID: [name] MRN: [id number] DOB/AGE: 1965/12/31 51 y.o.  Admit date: 01/12/2017 Discharge date: 01/13/2017  Admission Diagnoses:  Dysphagia post Nissen fundoplication  Discharge Diagnoses:  same  Active Problems:   Dysphagia   Surgery:  Laparoscopic takedown of Nissen fundoplication  Discharged Condition: improved  Hospital Course:   Had surgery on Friday.  Started on clear liquids Friday night and diet advanced on Saturday and ready for discharge  Consults: none  Significant Diagnostic Studies: none    Discharge Exam: Blood pressure 123/68, pulse 79, temperature 97.8 F (36.6 C), temperature source Oral, resp. rate 16, height 5' 6"  (1.676 m), weight 91.2 kg (201 lb), SpO2 93 %. Incisions bland  Disposition: 01-Home or Self Care  Discharge Instructions    Diet - low sodium heart healthy    Complete by:  As directed    Discharge wound care:    Complete by:  As directed    May shower   Increase activity slowly    Complete by:  As directed      Allergies as of 01/13/2017      Reactions   Imuran [azathioprine] Other (See Comments)   REACTION: pancreatitis   Meperidine Hcl Hives   Remicade [infliximab] Hives, Other (See Comments)   redness    Sulfa Antibiotics Hives, Swelling   Sulfa Drugs Cross Reactors Hives, Swelling   Avelox [moxifloxacin] Rash   Doxycycline Rash   Erythromycin Rash   Latex Rash      Medication List    TAKE these medications   aspirin-acetaminophen-caffeine 417-408-14 MG tablet Commonly known as:  EXCEDRIN MIGRAINE Take 2 tablets by mouth every 6 (six) hours as needed for headache.   CALTRATE 600+D PLUS PO Take 1 tablet by mouth daily.   CENTRUM SILVER ULTRA WOMENS PO Take 1 tablet by mouth daily.   ciprofloxacin 500 MG tablet Commonly known as:  CIPRO One po bid x 4 days   clobetasol ointment 0.05 % Commonly known as:  TEMOVATE Apply 1 application topically 2 (two) times daily. Use  for 4-7 days and wait 1 week before reusing   estradiol 1 MG tablet Commonly known as:  ESTRACE Take 1 mg by mouth daily.   fluticasone 50 MCG/ACT nasal spray Commonly known as:  FLONASE Place 1 spray into both nostrils daily as needed for allergies or rhinitis.   folic acid 1 MG tablet Commonly known as:  FOLVITE Take 2 mg by mouth daily.   HYDROcodone-acetaminophen 5-325 MG tablet Commonly known as:  NORCO/VICODIN Take 1 tablet by mouth every 6 (six) hours as needed.   ibuprofen 200 MG tablet Commonly known as:  ADVIL,MOTRIN Take 400 mg by mouth every 6 (six) hours as needed for mild pain.   KLONOPIN 1 MG tablet Generic drug:  clonazePAM Take 1 mg by mouth at bedtime as needed (sleep).   levothyroxine 137 MCG tablet Commonly known as:  SYNTHROID, LEVOTHROID Take 137 mcg by mouth every other day.   levothyroxine 125 MCG tablet Commonly known as:  SYNTHROID, LEVOTHROID Take 125 mcg by mouth every other day.   loratadine 10 MG tablet Commonly known as:  CLARITIN Take 10 mg by mouth daily.   mesalamine 1.2 g EC tablet Commonly known as:  LIALDA Take 1.2 g by mouth 2 (two) times daily.   methotrexate 50 MG/2ML injection INJECT 1 ML SUBCUTANEOUSLY  WEEKLY   OTEZLA 30 MG Tabs Generic drug:  Apremilast Take 30 mg by mouth 2 (two) times  daily.   pravastatin 40 MG tablet Commonly known as:  PRAVACHOL Take 40 mg by mouth daily.   ranitidine 75 MG tablet Commonly known as:  ZANTAC Take 75 mg by mouth daily.   sertraline 100 MG tablet Commonly known as:  ZOLOFT Take 100 mg by mouth daily.   Tuberculin-Allergy Syringes 27G X 1/2" 1 ML Kit Inject 1 Syringe into the skin once a week. To be used with weekly methotrexate injections      Follow-up Information    Johnathan Hausen, MD. Schedule an appointment as soon as possible for a visit in 3 week(s).   Specialty:  General Surgery Contact information: [address] [place]  [address] [phone]           Signed: [name] 01/13/2017, 9:37 AM

## 2017-01-13 NOTE — Care Management Obs Status (Signed)
[place] NOTIFICATION   Patient Details  Name: [name] MRN: [id number] Date of Birth: 1966-01-31   Medicare Observation Status Notification Given:  Yes    Erenest Rasher, RN 01/13/2017, 11:20 AM

## 2017-01-13 NOTE — Progress Notes (Signed)
Assessment unchanged. Pt verbalized understanding of dc instructions through teach back including when to call the doctor and follow up care. No scripts. Discharged via foot per request accompanied by NT and husband.

## 2017-01-13 NOTE — Care Management Note (Signed)
Case Management Note  Patient Details  Name: CALLAN YONTZ MRN: [id number] Date of Birth: Nov 15, 1965  Subjective/Objective:  Laparoscopic takedown of Nissen fundoplication                  Action/Plan: Discharge Planning: NCM spoke to pt and husband at home to assist with care. No NCM needs identified.   PCP Mayra Neer MD  Expected Discharge Date:  01/13/17               Expected Discharge Plan:  Home/Self Care  In-House Referral:  NA  Discharge planning Services  CM Consult  Post Acute Care Choice:  NA Choice offered to:  NA  DME Arranged:  N/A DME Agency:  NA  HH Arranged:  NA HH Agency:  NA  Status of Service:  Completed, signed off  If discussed at [place] of Stay Meetings, dates discussed:    Additional Comments:  Erenest Rasher, RN 01/13/2017, 11:20 AM

## 2017-01-29 ENCOUNTER — Other Ambulatory Visit: Payer: Self-pay | Admitting: Rheumatology

## 2017-01-30 NOTE — Telephone Encounter (Signed)
Last Visit: 01/09/17 Next Visit: 06/20/17 Labs: 01/13/17 Hgb 11.1   Okay to refill per Dr. Estanislado Pandy

## 2017-03-01 ENCOUNTER — Ambulatory Visit (INDEPENDENT_AMBULATORY_CARE_PROVIDER_SITE_OTHER): Payer: Medicare HMO | Admitting: Gastroenterology

## 2017-03-01 ENCOUNTER — Other Ambulatory Visit (INDEPENDENT_AMBULATORY_CARE_PROVIDER_SITE_OTHER): Payer: Medicare HMO

## 2017-03-01 ENCOUNTER — Encounter: Payer: Self-pay | Admitting: Gastroenterology

## 2017-03-01 VITALS — BP 120/60 | HR 77 | Ht 66.0 in | Wt 195.0 lb

## 2017-03-01 DIAGNOSIS — K51919 Ulcerative colitis, unspecified with unspecified complications: Secondary | ICD-10-CM | POA: Diagnosis not present

## 2017-03-01 DIAGNOSIS — R197 Diarrhea, unspecified: Secondary | ICD-10-CM | POA: Diagnosis not present

## 2017-03-01 DIAGNOSIS — R110 Nausea: Secondary | ICD-10-CM | POA: Diagnosis not present

## 2017-03-01 DIAGNOSIS — K5100 Ulcerative (chronic) pancolitis without complications: Secondary | ICD-10-CM | POA: Diagnosis not present

## 2017-03-01 DIAGNOSIS — K219 Gastro-esophageal reflux disease without esophagitis: Secondary | ICD-10-CM | POA: Diagnosis not present

## 2017-03-01 DIAGNOSIS — Z931 Gastrostomy status: Secondary | ICD-10-CM

## 2017-03-01 DIAGNOSIS — Z9889 Other specified postprocedural states: Secondary | ICD-10-CM

## 2017-03-01 LAB — CBC WITH DIFFERENTIAL/PLATELET
Basophils Absolute: 0.1 10*3/uL (ref 0.0–0.1)
Basophils Relative: 0.9 % (ref 0.0–3.0)
Eosinophils Absolute: 0.2 10*3/uL (ref 0.0–0.7)
Eosinophils Relative: 2.7 % (ref 0.0–5.0)
HCT: 40.8 % (ref 36.0–46.0)
Hemoglobin: 13.7 g/dL (ref 12.0–15.0)
Lymphocytes Relative: 35.6 % (ref 12.0–46.0)
Lymphs Abs: 2.2 10*3/uL (ref 0.7–4.0)
MCHC: 33.5 g/dL (ref 30.0–36.0)
MCV: 96 fl (ref 78.0–100.0)
Monocytes Absolute: 0.4 10*3/uL (ref 0.1–1.0)
Monocytes Relative: 7.1 % (ref 3.0–12.0)
Neutro Abs: 3.3 10*3/uL (ref 1.4–7.7)
Neutrophils Relative %: 53.7 % (ref 43.0–77.0)
Platelets: 251 10*3/uL (ref 150.0–400.0)
RBC: 4.25 Mil/uL (ref 3.87–5.11)
RDW: 12.7 % (ref 11.5–15.5)
WBC: 6.2 10*3/uL (ref 4.0–10.5)

## 2017-03-01 LAB — COMPREHENSIVE METABOLIC PANEL
ALT: 14 U/L (ref 0–35)
AST: 16 U/L (ref 0–37)
Albumin: 3.8 g/dL (ref 3.5–5.2)
Alkaline Phosphatase: 57 U/L (ref 39–117)
BUN: 14 mg/dL (ref 6–23)
CO2: 29 mEq/L (ref 19–32)
Calcium: 9.1 mg/dL (ref 8.4–10.5)
Chloride: 101 mEq/L (ref 96–112)
Creatinine, Ser: 0.77 mg/dL (ref 0.40–1.20)
GFR: 83.84 mL/min (ref >60.00)
Glucose, Bld: 98 mg/dL (ref 70–99)
Potassium: 4.1 mEq/L (ref 3.5–5.1)
Sodium: 137 mEq/L (ref 135–145)
Total Bilirubin: 0.4 mg/dL (ref 0.2–1.2)
Total Protein: 6.9 g/dL (ref 6.0–8.3)

## 2017-03-01 LAB — FERRITIN: Ferritin: 53.3 ng/mL (ref 10.0–291.0)

## 2017-03-01 LAB — SEDIMENTATION RATE: Sed Rate: 17 mm/hr (ref 0–30)

## 2017-03-01 LAB — FOLATE: Folate: 12.5 ng/mL (ref >5.9)

## 2017-03-01 LAB — VITAMIN B12: Vitamin B-12: 541 pg/mL (ref 211–911)

## 2017-03-01 LAB — HIGH SENSITIVITY CRP: CRP, High Sensitivity: 5.03 mg/L — ABNORMAL HIGH (ref 0.000–5.000)

## 2017-03-01 MED ORDER — ONDANSETRON HCL 4 MG PO TABS: 4.0000 mg | ORAL_TABLET | Freq: Three times a day (TID) | ORAL | 5 refills | Status: DC | PRN

## 2017-03-01 MED ORDER — RANITIDINE HCL 150 MG PO TABS: 150.0000 mg | ORAL_TABLET | Freq: Every day | ORAL | 11 refills | Status: DC

## 2017-03-01 MED ORDER — OMEPRAZOLE 20 MG PO CPDR: 20.0000 mg | DELAYED_RELEASE_CAPSULE | Freq: Every day | ORAL | 11 refills | Status: DC

## 2017-03-01 NOTE — Patient Instructions (Signed)
You have been scheduled for a Barium Esophogram at [facility] (1st floor of the hospital) on Tuesday, 03-13-17 at 10:30am. Please arrive 15 minutes prior to your appointment for registration. Make certain not to have anything to eat or drink 4 hours prior to your test. If you need to reschedule for any reason, please contact radiology at [phone] to do so. __________________________________________________________________ A barium swallow is an examination that concentrates on views of the esophagus. This tends to be a double contrast exam (barium and two liquids which, when combined, create a gas to distend the wall of the oesophagus) or single contrast (non-ionic iodine based). The study is usually tailored to your symptoms so a good history is essential. Attention is paid during the study to the form, structure and configuration of the esophagus, looking for functional disorders (such as aspiration, dysphagia, achalasia, motility and reflux) EXAMINATION You may be asked to change into a gown, depending on the type of swallow being performed. A radiologist and radiographer will perform the procedure. The radiologist will advise you of the type of contrast selected for your procedure and direct you during the exam. You will be asked to stand, sit or lie in several different positions and to hold a small amount of fluid in your mouth before being asked to swallow while the imaging is performed .In some instances you may be asked to swallow barium coated marshmallows to assess the motility of a solid food bolus. The exam can be recorded as a digital or video fluoroscopy procedure. POST PROCEDURE It will take 1-2 days for the barium to pass through your system. To facilitate this, it is important, unless otherwise directed, to increase your fluids for the next 24-48hrs and to resume your normal diet.  This test typically takes about 30 minutes to  perform. _________________________________________________________________________  Your physician has requested that you go to the basement for lab work before leaving today.   We have sent the following medications to your pharmacy for you to pick up at your convenience:  Omeprazole 20 mg Zofran 4 mg Zantac 159m  We will call you when we get more samples of Apriso.   We have given you a hand out regarding Lactose Free diet.  Please follow up in 3 months  If you are age 8055or older, your body mass index should be between 23-30. Your Body mass index is 31.47 kg/m. If this is out of the aforementioned range listed, please consider follow up with your Primary Care Provider.  If you are age 8036or younger, your body mass index should be between 19-25. Your Body mass index is 31.47 kg/m. If this is out of the aformentioned range listed, please consider follow up with your Primary Care Provider.

## 2017-03-01 NOTE — Progress Notes (Signed)
[name]    [id number]    March 20, 1966  Primary Care Physician:Shaw, Nathen May, MD  Referring Physician: Mayra Neer, MD [address] [place], [place] [address]  Chief complaint:  Ulcerative colitis  HPI: 51 year old female with history of ulcerative colitis diagnosed in 2002 with pan colitis here for follow-up visit. Patient also has psoriasis and psoriatic arthritis. She is on methotrexate injections and taking Lialda 2.4 g daily. She continues to have intermittent episodes of diarrhea about 2-3 times a week where she has 4-6 bowel movements, on most days she has about 2 or 3 bowel movements a day. Denies any blood in stool or excess mucous.  Last colonoscopy January 2017 with removal of small sessile polyp. Patient had a revision done of Nissen fundoplication due to persistent dysphagia, she reports improvement of swallowing but is starting to have heartburn symptoms. Denies any  vomiting or abdominal pain. She has occasional intermittent nausea.   Past GI history: [name] is a pleasant 51 year old female who was previously followed by Dr. Olevia Perches. She was diagnosed with ulcerative colitis in 2002. A prior colonoscopy in 2011 showed pancolitis, and repeat colonoscopy in December 2014 showed a normal transverse and right colon but chronic active colitis from 0-30 cm. She has a history of psoriatic arthritis for which she is followed by Dr. Estil Daft. She had tried Remicade but developed antibodies. She has used Humira, Enbrel, and symponi all of which did not control her psoriasis. She is currently on Stellara every 8 weeks and feels it is controlling her psoriasis better than the other agents have. She is also on methotrexate 2.5 mg daily. She has a history of GERD and had a Nissen fundoplication approximately 12 years ago. She has intermittent dysphagia to solids and has required dilation in the past. Her last upper endoscopy in February 2011 showed  no evidence of Barrett's esophagus. She takes omeprazole 20 mg daily  Outpatient Encounter Prescriptions as of 03/01/2017  Medication Sig  . Apremilast (OTEZLA) 30 MG TABS Take 30 mg by mouth 2 (two) times daily.  Marland Kitchen aspirin-acetaminophen-caffeine (EXCEDRIN MIGRAINE) 250-250-65 MG tablet Take 2 tablets by mouth every 6 (six) hours as needed for headache.  . Calcium Carbonate-Vit D-Min (CALTRATE 600+D PLUS PO) Take 1 tablet by mouth daily.  . clobetasol ointment (TEMOVATE) 6.31 % Apply 1 application topically 2 (two) times daily. Use for 4-7 days and wait 1 week before reusing  . clonazePAM (KLONOPIN) 1 MG tablet Take 1 mg by mouth at bedtime as needed (sleep).   Marland Kitchen estradiol (ESTRACE) 1 MG tablet Take 1 mg by mouth daily.  . fluticasone (FLONASE) 50 MCG/ACT nasal spray Place 1 spray into both nostrils daily as needed for allergies or rhinitis.  . folic acid (FOLVITE) 1 MG tablet Take 2 mg by mouth daily.  Marland Kitchen ibuprofen (ADVIL,MOTRIN) 200 MG tablet Take 400 mg by mouth every 6 (six) hours as needed for mild pain.   Marland Kitchen levothyroxine (SYNTHROID, LEVOTHROID) 125 MCG tablet Take 125 mcg by mouth every other day.   . levothyroxine (SYNTHROID, LEVOTHROID) 137 MCG tablet Take 137 mcg by mouth every other day.  . loratadine (CLARITIN) 10 MG tablet Take 10 mg by mouth daily.  . mesalamine (LIALDA) 1.2 g EC tablet Take 1.2 g by mouth 2 (two) times daily.  . methotrexate 50 MG/2ML injection INJECT 1 ML SUBCUTANEOUSLY  WEEKLY   . Multiple Vitamins-Minerals (CENTRUM SILVER ULTRA WOMENS PO)  Take 1 tablet by mouth daily.  . pravastatin (PRAVACHOL) 40 MG tablet Take 40 mg by mouth daily.   . ranitidine (ZANTAC) 75 MG tablet Take 75 mg by mouth daily.  . sertraline (ZOLOFT) 100 MG tablet Take 100 mg by mouth daily.  . Tuberculin-Allergy Syringes 27G X 1/2" 1 ML KIT Inject 1 Syringe into the skin once a week. To be used with weekly methotrexate injections  . [DISCONTINUED] ciprofloxacin (CIPRO) 500 MG tablet One po  bid x 4 days (Patient not taking: Reported on 01/05/2017)  . [DISCONTINUED] HYDROcodone-acetaminophen (NORCO/VICODIN) 5-325 MG tablet Take 1 tablet by mouth every 6 (six) hours as needed. (Patient not taking: Reported on 01/05/2017)   No facility-administered encounter medications on file as of 03/01/2017.     Allergies as of 03/01/2017 - Review Complete 03/01/2017  Allergen Reaction Noted  . Imuran [azathioprine] Other (See Comments) 08/24/2009  . Meperidine hcl Hives 08/24/2009  . Remicade [infliximab] Hives and Other (See Comments) 01/26/2011  . Sulfa antibiotics Hives and Swelling 07/27/2015  . Sulfa drugs cross reactors Hives and Swelling 08/04/2011  . Avelox [moxifloxacin] Rash 08/24/2009  . Doxycycline Rash   . Erythromycin Rash 08/24/2009  . Latex Rash 08/24/2009    Past Medical History:  Diagnosis Date  . Arthritis   . Depression    treated  . Fibrocystic breast disease   . GERD (gastroesophageal reflux disease)   . History of hiatal hernia    repaired  . History of kidney stones    questionable  . Hyperlipidemia   . Hypertension   . Hypothyroidism   . Irritable bowel syndrome   . Ovarian cyst   . Pancreatitis   . PONV (postoperative nausea and vomiting)   . Psoriasis   . Psoriatic arthritis ([place]) 05/12/2016   Poor response to Humira Inadequate response to Enbrel Inadequate response to Simponi  . Tachycardia   . Ulcerative colitis     Past Surgical History:  Procedure Laterality Date  . ANKLE RECONSTRUCTION     left  . bladder tuck    . COLONOSCOPY    . EYE SURGERY     Lasik  . KNEE SURGERY     left  . LAPAROSCOPIC NISSEN FUNDOPLICATION N/A 2/35/5732   Procedure: LAPAROSCOPIC NISSEN TAKEDOWN AND UPPER ENDOSCOPY;  Surgeon: Johnathan Hausen, MD;  Location: [facility];  Service: General;  Laterality: N/A;  . NASAL SEPTUM SURGERY    . NISSEN FUNDOPLICATION    . PARTIAL HYSTERECTOMY    . repair cystocele and rectocele    . thyroid ablation    . TUBAL LIGATION     . UPPER GI ENDOSCOPY  01/12/2017   Procedure: UPPER GI ENDOSCOPY;  Surgeon: Johnathan Hausen, MD;  Location: [facility];  Service: General;;    Family History  Problem Relation Age of Onset  . Breast cancer Mother   . Heart disease Mother   . Colon polyps Mother   . Other Mother        pre-cancerous tumor removed  . Heart disease Father   . Crohn's disease Sister   . Irritable bowel syndrome Unknown   . Colon cancer Neg Hx     Social History   Social History  . Marital status: Married    Spouse name: N/A  . Number of children: N/A  . Years of education: N/A   Occupational History  . 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 Smoking  status: Former Smoker    Quit date: 07/27/2005  . Smokeless tobacco: Never Used  . Alcohol use 0.0 oz/week     Comment: occasionaly  . Drug use: No  . Sexual activity: Yes   Other Topics Concern  . Not on file   Social History Narrative  . No narrative on file      Review of systems: Review of Systems  Constitutional: Negative for fever and chills.  HENT: Positive for sinus problems  Eyes: Negative for blurred vision.  Respiratory: Negative for cough, shortness of breath and wheezing.   Cardiovascular: Negative for chest pain and palpitations.  Gastrointestinal: as per HPI Genitourinary: Negative for dysuria, urgency, frequency and hematuria.  Musculoskeletal: Positive for myalgias, back pain and joint pain.  Skin: Negative for itching and rash.  Neurological: Negative for dizziness, tremors, focal weakness, seizures and loss of consciousness.  Endo/Heme/Allergies: Positive for seasonal allergies.  Psychiatric/Behavioral: Negative for suicidal ideas and hallucinations.  positive for depression and anxiety All other systems reviewed and are negative.   Physical Exam: Vitals:   03/01/17 0945  BP: 120/60  Pulse: 77   Body mass index is 31.47 kg/m. Gen:      No acute  distress HEENT:  EOMI, sclera anicteric Neck:     No masses; no thyromegaly Lungs:    Clear to auscultation bilaterally; normal respiratory effort CV:         Regular rate and rhythm; no murmurs Abd:      + bowel sounds; soft, non-tender; no palpable masses, no distension Ext:    No edema; adequate peripheral perfusion Skin:      Warm and dry; no rash Neuro: alert and oriented x 3 Psych: normal mood and affect  Data Reviewed:  Reviewed labs, radiology imaging, old records and pertinent past GI work up   Assessment and Plan/Recommendations:  51 year old female with history of ulcerative pan colitis diagnosed in 2002, psoriasis and psoriatic arthritis currently on methotrexate and Lialda 2.4 g here for follow-up visit  Intermittent diarrhea: Could be secondary to lactose intolerance/? Overlap IBS-D We will need to exclude C. difficile Last colonoscopy in January 2017 with no evidence of active inflammation Advised patient to avoid lactose Due for surveillance colonoscopy in January 2019  Heartburn/GERD: Status post revision of Nissen fundoplication Obtain upper GI series to evaluate Omeprazole 20 mg before breakfast  Zantac 150 milligrams at bedtime as needed Antireflux measures  UC: We will try to increase mesalamine to 4 tabs daily, she has higher out of pocket expense with Lialda. Switched to [employer] Follow-up CBC, CMP, CRP, B12, folate and ferritin  Intermittent nausea: Okay to use Zofran 4 mg as needed every 8 hours  25 minutes was spent face-to-face with the patient. Greater than 50% of the time used for counseling as well as treatment plan and follow-up. She had multiple questions which were answered to her satisfaction  Damaris Hippo , MD [phone] Mon-Fri 8a-5p [phone] after 5p, weekends, holidays  CC: Mayra Neer, MD

## 2017-03-06 DIAGNOSIS — G4700 Insomnia, unspecified: Secondary | ICD-10-CM | POA: Diagnosis not present

## 2017-03-06 DIAGNOSIS — E7800 Pure hypercholesterolemia, unspecified: Secondary | ICD-10-CM | POA: Diagnosis not present

## 2017-03-06 DIAGNOSIS — E669 Obesity, unspecified: Secondary | ICD-10-CM | POA: Diagnosis not present

## 2017-03-06 DIAGNOSIS — J309 Allergic rhinitis, unspecified: Secondary | ICD-10-CM | POA: Diagnosis not present

## 2017-03-06 DIAGNOSIS — N951 Menopausal and female climacteric states: Secondary | ICD-10-CM | POA: Diagnosis not present

## 2017-03-06 DIAGNOSIS — F322 Major depressive disorder, single episode, severe without psychotic features: Secondary | ICD-10-CM | POA: Diagnosis not present

## 2017-03-06 DIAGNOSIS — E039 Hypothyroidism, unspecified: Secondary | ICD-10-CM | POA: Diagnosis not present

## 2017-03-06 DIAGNOSIS — K5190 Ulcerative colitis, unspecified, without complications: Secondary | ICD-10-CM | POA: Diagnosis not present

## 2017-03-06 DIAGNOSIS — I10 Essential (primary) hypertension: Secondary | ICD-10-CM | POA: Diagnosis not present

## 2017-03-08 ENCOUNTER — Telehealth: Payer: Self-pay

## 2017-03-08 NOTE — Telephone Encounter (Signed)
Mauri Pole, MD  Roetta Sessions, CMA     Called and left message for patient that samples are ready for her to pick up. Instructed her to take 4 capsules per day.  We gave her 7 days worth of samples.  Told her to call us back if she tolerated them well and Dr. Silverio Decamp will send a prescription.     1.5gm daily (0.375g x 4 tabs) x 30 days 11 refills  Thanks   Previous Messages    ----- Message -----  From: Roetta Sessions, CMA  Sent: 03/08/2017  8:57 AM  To: Mauri Pole, MD  Subject: RE: Apriso samples                Ok. How would you like her to take them? 1 a day?  Thank you,  Jan    ----- Message -----  From: Mauri Pole, MD  Sent: 03/08/2017  8:48 AM  To: Roetta Sessions, CMA  Subject: RE: Apriso samples                Please give her enough for a week or so to try and also call in Rx if she tolerates them well. Thanks  ----- Message -----  From: Roetta Sessions, CMA  Sent: 03/07/2017  4:56 PM  To: Mauri Pole, MD  Subject: Apriso samples                  Dr. Silverio Decamp, when this patient was last in, you wanted her to have Apriso samples but we didn't have any at that time. Magda Paganini called the rep to get more and we received them in the office today. How much would you like me to give her? Also, do you want her to have a Rx as well? Thanks, Jan

## 2017-03-13 ENCOUNTER — Ambulatory Visit (HOSPITAL_COMMUNITY): Payer: Medicare HMO

## 2017-03-26 ENCOUNTER — Ambulatory Visit (HOSPITAL_COMMUNITY)
Admission: RE | Admit: 2017-03-26 | Discharge: 2017-03-26 | Disposition: A | Discharge status: Home / Self Care | Payer: Medicare HMO | Source: Ambulatory Visit | Attending: Gastroenterology | Admitting: Gastroenterology

## 2017-03-26 DIAGNOSIS — K219 Gastro-esophageal reflux disease without esophagitis: Secondary | ICD-10-CM | POA: Diagnosis not present

## 2017-03-26 DIAGNOSIS — K5100 Ulcerative (chronic) pancolitis without complications: Secondary | ICD-10-CM

## 2017-03-26 DIAGNOSIS — R110 Nausea: Secondary | ICD-10-CM | POA: Insufficient documentation

## 2017-03-26 DIAGNOSIS — Z9889 Other specified postprocedural states: Secondary | ICD-10-CM | POA: Diagnosis not present

## 2017-03-26 DIAGNOSIS — R1310 Dysphagia, unspecified: Secondary | ICD-10-CM | POA: Diagnosis not present

## 2017-04-12 DIAGNOSIS — J324 Chronic pansinusitis: Secondary | ICD-10-CM | POA: Diagnosis not present

## 2017-04-26 ENCOUNTER — Other Ambulatory Visit: Payer: Self-pay | Admitting: Gastroenterology

## 2017-04-26 MED ORDER — OMEPRAZOLE 20 MG PO CPDR: 20.0000 mg | DELAYED_RELEASE_CAPSULE | Freq: Every day | ORAL | 3 refills | Status: DC

## 2017-04-26 NOTE — Telephone Encounter (Signed)
Received a pt requested fax for 90 day supply of omeprazole 20 mg from [facility]. Med sent in.

## 2017-05-01 ENCOUNTER — Telehealth: Payer: Self-pay

## 2017-05-01 NOTE — Telephone Encounter (Signed)
Received a fax from [place] [employer] regarding patients renewal application for 3524. Patient is required to fill out section A of the application and the provider must complete and sign section B. Patient must submit proof of annual income to enroll. Will fill out provider portion and fax document once it has been signed.   Phone number: [phone] Fax number: [phone]  Called patient to update. She has not received a letter in the mail yet. She will fill out her portion once received and mail it in. If she has not received it by Thanksgiving, she will give [place] a call.   Ladislav Caselli, [place], CPhT 4:10 PM

## 2017-05-03 DIAGNOSIS — J324 Chronic pansinusitis: Secondary | ICD-10-CM | POA: Diagnosis not present

## 2017-05-04 DIAGNOSIS — J329 Chronic sinusitis, unspecified: Secondary | ICD-10-CM | POA: Diagnosis not present

## 2017-05-04 DIAGNOSIS — R0981 Nasal congestion: Secondary | ICD-10-CM | POA: Diagnosis not present

## 2017-05-04 DIAGNOSIS — G501 Atypical facial pain: Secondary | ICD-10-CM | POA: Diagnosis not present

## 2017-05-22 NOTE — Telephone Encounter (Signed)
Provider portion was completed and faxed to foundation.   Will update once we receive a response.   Dierre Crevier, [place], CPhT 12:50 PM

## 2017-06-05 DIAGNOSIS — J3089 Other allergic rhinitis: Secondary | ICD-10-CM | POA: Diagnosis not present

## 2017-06-05 DIAGNOSIS — R062 Wheezing: Secondary | ICD-10-CM | POA: Diagnosis not present

## 2017-06-05 DIAGNOSIS — J301 Allergic rhinitis due to pollen: Secondary | ICD-10-CM | POA: Diagnosis not present

## 2017-06-05 DIAGNOSIS — J309 Allergic rhinitis, unspecified: Secondary | ICD-10-CM | POA: Diagnosis not present

## 2017-06-08 NOTE — Progress Notes (Signed)
Office Visit Note  Patient: [name]             Date of Birth: 08-20-1965           MRN: [id number]             PCP: Mayra Neer, MD Referring: Mayra Neer, MD Visit Date: 06/20/2017 Occupation: @GUAROCC @    Subjective:  Knee pain    History of Present Illness: [name] is a 51 y.o. female with psoriatic arthritis and osteoarthritis.  She states Otezla and methotrexate have been working well for her.  She states her psoriasis have improved significantly.  She continues to have psoriasis on bilateral soles and a patch on her right arm.  Her knees continue to cause discomfort.  She has pain with climbing stairs.  She denies any knee swelling. Her bilateral SI joints are causing pain.  She states she does exercises at home.  She denies any achilles tendonitis or plantar fasciitis.  Denies joint swelling in her feet.  She has minimal joint swelling in her hands and wrists.  Her hands and wrists still are stiff in the morning but for a much shorter duration than before [place].     She reports she has been having vision changes. She states her vision has become blurry bilaterally.  She denies any double vision.  She has dryness, occasional redness, and intermittent pain behind her bilateral eyes.  She reports she has an appointment with her ophthalmologist very soon.     Activities of Daily Living:  Patient reports morning stiffness for 1.5 hours.   Patient Denies nocturnal pain.  Difficulty dressing/grooming: Denies Difficulty climbing stairs: Reports Difficulty getting out of chair: Reports Difficulty using hands for taps, buttons, cutlery, and/or writing: Denies   Review of Systems  Constitutional: Positive for fatigue. Negative for weakness.  HENT: Negative for mouth sores, trouble swallowing, trouble swallowing, mouth dryness and nose dryness.   Eyes: Positive for redness, visual disturbance and dryness.  Respiratory: Positive for wheezing (Followed by  allergist). Negative for cough, hemoptysis, shortness of breath and difficulty breathing.   Cardiovascular: Negative for chest pain, palpitations, hypertension, irregular heartbeat and swelling in legs/feet.  Gastrointestinal: Positive for diarrhea (Ulcerative colitis). Negative for blood in stool and constipation.  Endocrine: Negative for increased urination.  Genitourinary: Negative for painful urination.  Musculoskeletal: Positive for arthralgias, joint pain and morning stiffness. Negative for joint swelling, myalgias, muscle weakness, muscle tenderness and myalgias.  Skin: Positive for rash (Psoriasis patches scattered). Negative for color change, pallor, hair loss, nodules/bumps, redness, skin tightness, ulcers and sensitivity to sunlight.  Neurological: Negative for dizziness, numbness and headaches.  Hematological: Negative for swollen glands.  Psychiatric/Behavioral: Positive for depressed mood (on Zoloft) and sleep disturbance. The patient is not nervous/anxious.     PMFS History:  Patient Active Problem List   Diagnosis Date Noted  . Dysphagia 01/12/2017  . 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 Primary osteoarthritis of both knees 08/11/2016  . History of hypertension 08/11/2016  . History of ulcerative colitis 08/11/2016  . History of IBS 08/11/2016  . 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 High risk medications (not anticoagulants) long-term use 05/13/2016  . Knee pain, bilateral 05/13/2016  . Anserine bursitis 05/13/2016  . 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 Chondromalacia patellae, right knee 05/13/2016  . Psoriatic arthritis ([place]) 05/12/2016  . UNSPECIFIED HYPOTHYROIDISM 08/24/2009  . HYPERLIPIDEMIA 08/24/2009  . Essential hypertension 08/24/2009  . GERD 08/24/2009  .  Ulcerative colitis ([place]) 08/24/2009  . IRRITABLE BOWEL SYNDROME 08/24/2009  . OVARIAN CYST 08/24/2009  . PSORIASIS 08/24/2009  . ARTHRITIS 08/24/2009  .  TACHYCARDIA, HX OF 08/24/2009  . PANCREATITIS, HX OF 08/24/2009  . FIBROCYSTIC BREAST DISEASE, HX OF 08/24/2009    Past Medical History:  Diagnosis Date  . Arthritis   . Depression    treated  . Fibrocystic breast disease   . GERD (gastroesophageal reflux disease)   . History of hiatal hernia    repaired  . History of kidney stones    questionable  . Hyperlipidemia   . Hypertension   . Hypothyroidism   . Irritable bowel syndrome   . Ovarian cyst   . Pancreatitis   . PONV (postoperative nausea and vomiting)   . Psoriasis   . Psoriatic arthritis ([place]) 05/12/2016   Poor response to Humira Inadequate response to Enbrel Inadequate response to Simponi  . Tachycardia   . Ulcerative colitis     Family History  Problem Relation Age of Onset  . Breast cancer Mother   . Heart disease Mother   . Colon polyps Mother   . Other Mother        pre-cancerous tumor removed  . Heart disease Father   . Crohn's disease Sister   . Alcoholism Sister   . Irritable bowel syndrome Unknown   . Crohn's disease Daughter   . Colon cancer Neg Hx    Past Surgical History:  Procedure Laterality Date  . ANKLE RECONSTRUCTION     left  . bladder tuck    . COLONOSCOPY    . EYE SURGERY     Lasik  . KNEE SURGERY     left  . LAPAROSCOPIC NISSEN FUNDOPLICATION N/A 11/24/2583   Procedure: LAPAROSCOPIC NISSEN TAKEDOWN AND UPPER ENDOSCOPY;  Surgeon: Johnathan Hausen, MD;  Location: [facility];  Service: General;  Laterality: N/A;  . NASAL SEPTUM SURGERY    . NISSEN FUNDOPLICATION    . PARTIAL HYSTERECTOMY    . repair cystocele and rectocele    . thyroid ablation    . TUBAL LIGATION    . UPPER GI ENDOSCOPY  01/12/2017   Procedure: UPPER GI ENDOSCOPY;  Surgeon: Johnathan Hausen, MD;  Location: [facility];  Service: General;;   Social History   Social History Narrative  . Not on file     Objective: Vital Signs: BP 127/83 (BP Location: Left Arm, Patient Position: Sitting, Cuff Size: Normal)   Pulse 75    Resp 14   Ht 5' 6"  (1.676 m)   Wt 202 lb (91.6 kg)   BMI 32.60 kg/m    Physical Exam  Constitutional: She is oriented to person, place, and time. She appears well-developed and well-nourished.  HENT:  Head: Normocephalic and atraumatic.  Eyes: Conjunctivae and EOM are normal.  Neck: Normal range of motion.  Cardiovascular: Normal rate, regular rhythm, normal heart sounds and intact distal pulses.  Pulmonary/Chest: Effort normal and breath sounds normal.  Abdominal: Soft. Bowel sounds are normal.  Lymphadenopathy:    She has no cervical adenopathy.  Neurological: She is alert and oriented to person, place, and time.  Skin: Skin is warm and dry. Capillary refill takes less than 2 seconds.  Very mild psoriasis on right arm and bilateral soles of feet.  No excoriations or scaly present.   No nail pitting  Psychiatric: She has a normal mood and affect. Her behavior is normal.  Nursing note and vitals reviewed.    Musculoskeletal Exam: C-spine  and thoracic spine good ROM. Lumbar spine limited range of motion. Tenderness over bilateral SI joints.  Shoulder joints, elbow joints, wrist joints, good range of motion.  MCPs, PIPs, DIPs good ROM with no synovitis.  Complete fist formation.  Hip joints, knee joints, and ankle joints good ROM.  MTPs, PIPs, and DIPs good ROM.  No achilles tendonitis or plantar fasciitis.     CDAI Exam: CDAI Homunculus Exam:   Joint Counts:  CDAI Tender Joint count: 0 CDAI Swollen Joint count: 0  Global Assessments:  Patient Global Assessment: 3 Provider Global Assessment: 3  CDAI Calculated Score: 6    Investigation: No additional findings.chest xray: 02/14/2016 CBC Latest Ref Rng & Units 03/01/2017 01/13/2017 01/12/2017  WBC 4.0 - 10.5 K/uL 6.2 11.2(H) 11.6(H)  Hemoglobin 12.0 - 15.0 g/dL 13.7 11.1(L) 12.4  Hematocrit 36.0 - 46.0 % 40.8 33.9(L) 37.2  Platelets 150.0 - 400.0 K/uL 251.0 253 258   CMP Latest Ref Rng & Units 03/01/2017 01/13/2017 01/12/2017    Glucose 70 - 99 mg/dL 98 112(H) -  BUN 6 - 23 mg/dL 14 10 -  Creatinine 0.40 - 1.20 mg/dL 0.77 0.82 0.88  Sodium 135 - 145 mEq/L 137 139 -  Potassium 3.5 - 5.1 mEq/L 4.1 4.0 -  Chloride 96 - 112 mEq/L 101 102 -  CO2 19 - 32 mEq/L 29 30 -  Calcium 8.4 - 10.5 mg/dL 9.1 8.7(L) -  Total Protein 6.0 - 8.3 g/dL 6.9 - -  Total Bilirubin 0.2 - 1.2 mg/dL 0.4 - -  Alkaline Phos 39 - 117 U/L 57 - -  AST 0 - 37 U/L 16 - -  ALT 0 - 35 U/L 14 - -    Imaging: No results found.  Speciality Comments: No specialty comments available.    Procedures:  No procedures performed Allergies: Imuran [azathioprine]; Meperidine hcl; Remicade [infliximab]; Sulfa antibiotics; Sulfa drugs cross reactors; Sulfasalazine; Avelox [moxifloxacin]; Doxycycline; Erythromycin; and Latex   Assessment / Plan:     Visit Diagnoses: Psoriatic arthritis ([place]): No synovitis on exam.  She occasional hand and wrist discomfort.  She is well controlled on Otezla and Methotrexate.        PSORIASIS: Patient reports she is using Clobetasol on her active patches on her right arm and soles of feet.  Overall, [place] has cleared up her psoriasis significantly.  No nail pitting present.    High risk medications (not anticoagulants) long-term use - Otezla, MTX-Routine labs repeated today and will be repeated every 3 months. 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  TB gold test will not be repeated.  Chest x-rays were performed yearly previously.  Last CXR was 02/14/16.  Patient is on [place], so she will not require a yearly CXR at this time.    DDD (degenerative disc disease), lumbar: continues to have discomfort.    Primary osteoarthritis of both knees - Bilateral moderate with chondromalacia patella.  Patient continues to have pain.  No swelling or warmth on  exam.    Spondylosis of lumbar region without myelopathy or radiculopathy: Continues to have discomfort and limited ROM.    Other medical conditions are listed as follows:   History of ulcerative colitis: Well controlled.  No recent blood in stool.  Followed by GI.    History of IBS: Followed by GI.  History of hypertension  History of  gastroesophageal reflux (GERD)    Orders: Orders Placed This Encounter  Procedures  . CBC with Differential/Platelet  . COMPLETE METABOLIC PANEL WITH GFR  . CBC with Differential/Platelet  . COMPLETE METABOLIC PANEL WITH GFR   No orders of the defined types were placed in this encounter.    Follow-Up Instructions: Return in about 5 months (around 11/18/2017) for Psoriatic arthritis.    Note - This record has been created using [employer].  Chart creation errors have been sought, but may not always  have been located. Such creation errors do not reflect on  the standard of medical care.

## 2017-06-20 ENCOUNTER — Ambulatory Visit: Payer: Medicare HMO | Admitting: Rheumatology

## 2017-06-20 ENCOUNTER — Encounter: Payer: Self-pay | Admitting: Rheumatology

## 2017-06-20 VITALS — BP 127/83 | HR 75 | Resp 14 | Ht 66.0 in | Wt 202.0 lb

## 2017-06-20 DIAGNOSIS — M47816 Spondylosis without myelopathy or radiculopathy, lumbar region: Secondary | ICD-10-CM | POA: Diagnosis not present

## 2017-06-20 DIAGNOSIS — M51369 Other intervertebral disc degeneration, lumbar region without mention of lumbar back pain or lower extremity pain: Secondary | ICD-10-CM

## 2017-06-20 DIAGNOSIS — Z8679 Personal history of other diseases of the circulatory system: Secondary | ICD-10-CM

## 2017-06-20 DIAGNOSIS — Z8719 Personal history of other diseases of the digestive system: Secondary | ICD-10-CM

## 2017-06-20 DIAGNOSIS — R7611 Nonspecific reaction to tuberculin skin test without active tuberculosis: Secondary | ICD-10-CM | POA: Diagnosis not present

## 2017-06-20 DIAGNOSIS — M170 Bilateral primary osteoarthritis of knee: Secondary | ICD-10-CM | POA: Diagnosis not present

## 2017-06-20 DIAGNOSIS — L408 Other psoriasis: Secondary | ICD-10-CM

## 2017-06-20 DIAGNOSIS — L4050 Arthropathic psoriasis, unspecified: Secondary | ICD-10-CM

## 2017-06-20 DIAGNOSIS — Z79899 Other long term (current) drug therapy: Secondary | ICD-10-CM | POA: Diagnosis not present

## 2017-06-20 DIAGNOSIS — M5136 Other intervertebral disc degeneration, lumbar region: Secondary | ICD-10-CM | POA: Diagnosis not present

## 2017-06-20 LAB — COMPLETE METABOLIC PANEL WITH GFR
AG Ratio: 1.4 (calc) (ref 1.0–2.5)
ALT: 20 U/L (ref 6–29)
AST: 20 U/L (ref 10–35)
Albumin: 4.1 g/dL (ref 3.6–5.1)
Alkaline phosphatase (APISO): 65 U/L (ref 33–130)
BUN: 11 mg/dL (ref 7–25)
CO2: 31 mmol/L (ref 20–32)
Calcium: 9.4 mg/dL (ref 8.6–10.4)
Chloride: 101 mmol/L (ref 98–110)
Creat: 0.81 mg/dL (ref 0.50–1.05)
GFR, Est African American: 97 mL/min/{1.73_m2} (ref >=60)
GFR, Est Non African American: 84 mL/min/{1.73_m2} (ref >=60)
Globulin: 3 g/dL (calc) (ref 1.9–3.7)
Glucose, Bld: 98 mg/dL (ref 65–99)
Potassium: 4.4 mmol/L (ref 3.5–5.3)
Sodium: 138 mmol/L (ref 135–146)
Total Bilirubin: 0.5 mg/dL (ref 0.2–1.2)
Total Protein: 7.1 g/dL (ref 6.1–8.1)

## 2017-06-20 LAB — CBC WITH DIFFERENTIAL/PLATELET
Basophils Absolute: 53 cells/uL (ref 0–200)
Basophils Relative: 0.7 %
Eosinophils Absolute: 150 cells/uL (ref 15–500)
Eosinophils Relative: 2 %
HCT: 39 % (ref 35.0–45.0)
Hemoglobin: 13.2 g/dL (ref 11.7–15.5)
Lymphs Abs: 2085 cells/uL (ref 850–3900)
MCH: 31.1 pg (ref 27.0–33.0)
MCHC: 33.8 g/dL (ref 32.0–36.0)
MCV: 92 fL (ref 80.0–100.0)
MPV: 10.2 fL (ref 7.5–12.5)
Monocytes Relative: 7.1 %
Neutro Abs: 4680 cells/uL (ref 1500–7800)
Neutrophils Relative %: 62.4 %
Platelets: 281 10*3/uL (ref 140–400)
RBC: 4.24 10*6/uL (ref 3.80–5.10)
RDW: 12.7 % (ref 11.0–15.0)
Total Lymphocyte: 27.8 %
WBC mixed population: 533 cells/uL (ref 200–950)
WBC: 7.5 10*3/uL (ref 3.8–10.8)

## 2017-06-21 NOTE — Progress Notes (Signed)
WNL

## 2017-07-11 ENCOUNTER — Other Ambulatory Visit: Payer: Self-pay | Admitting: Family Medicine

## 2017-07-11 DIAGNOSIS — Z1231 Encounter for screening mammogram for malignant neoplasm of breast: Secondary | ICD-10-CM

## 2017-07-12 DIAGNOSIS — E7800 Pure hypercholesterolemia, unspecified: Secondary | ICD-10-CM | POA: Diagnosis not present

## 2017-07-16 ENCOUNTER — Other Ambulatory Visit: Payer: Self-pay | Admitting: *Deleted

## 2017-07-16 MED ORDER — METHOTREXATE SODIUM CHEMO INJECTION 50 MG/2ML: INTRAMUSCULAR | 0 refills | Status: DC

## 2017-07-16 NOTE — Telephone Encounter (Signed)
Refill request received via fax  Last Visit: 06/20/17 Next Visit: 12/06/17 Labs: 06/20/17 WNL  Okay to refill per Dr. Estanislado Pandy

## 2017-07-19 DIAGNOSIS — N6310 Unspecified lump in the right breast, unspecified quadrant: Secondary | ICD-10-CM | POA: Diagnosis not present

## 2017-07-19 DIAGNOSIS — Z01419 Encounter for gynecological examination (general) (routine) without abnormal findings: Secondary | ICD-10-CM | POA: Diagnosis not present

## 2017-07-20 DIAGNOSIS — H2511 Age-related nuclear cataract, right eye: Secondary | ICD-10-CM | POA: Diagnosis not present

## 2017-07-23 ENCOUNTER — Other Ambulatory Visit: Payer: Self-pay | Admitting: Nurse Practitioner

## 2017-07-23 DIAGNOSIS — N6310 Unspecified lump in the right breast, unspecified quadrant: Secondary | ICD-10-CM

## 2017-07-26 ENCOUNTER — Ambulatory Visit
Admission: RE | Admit: 2017-07-26 | Discharge: 2017-07-26 | Disposition: A | Discharge status: Home / Self Care | Payer: Medicare HMO | Source: Ambulatory Visit | Attending: Nurse Practitioner | Admitting: Nurse Practitioner

## 2017-07-26 DIAGNOSIS — N6489 Other specified disorders of breast: Secondary | ICD-10-CM | POA: Diagnosis not present

## 2017-07-26 DIAGNOSIS — N6310 Unspecified lump in the right breast, unspecified quadrant: Secondary | ICD-10-CM

## 2017-07-26 DIAGNOSIS — R928 Other abnormal and inconclusive findings on diagnostic imaging of breast: Secondary | ICD-10-CM | POA: Diagnosis not present

## 2017-08-07 ENCOUNTER — Telehealth: Payer: Self-pay

## 2017-08-07 NOTE — Telephone Encounter (Signed)
Received a fax from [employer] stating that pt has been approved to receive Otezla through the program from 08/06/2017 through 07/16/2018.   Will send document to scan center.   Called pt to update. Pt voices understanding and denies any questions at this time.  Courtny Bennison, [place], CPhT 8:54 AM

## 2017-08-10 ENCOUNTER — Ambulatory Visit: Payer: Self-pay

## 2017-08-22 ENCOUNTER — Encounter: Payer: Self-pay | Admitting: Gastroenterology

## 2017-08-28 ENCOUNTER — Encounter: Payer: Self-pay | Admitting: Gastroenterology

## 2017-09-03 DIAGNOSIS — E7800 Pure hypercholesterolemia, unspecified: Secondary | ICD-10-CM | POA: Diagnosis not present

## 2017-09-03 DIAGNOSIS — Z0000 Encounter for general adult medical examination without abnormal findings: Secondary | ICD-10-CM | POA: Diagnosis not present

## 2017-09-03 DIAGNOSIS — H2511 Age-related nuclear cataract, right eye: Secondary | ICD-10-CM | POA: Diagnosis not present

## 2017-09-03 DIAGNOSIS — L409 Psoriasis, unspecified: Secondary | ICD-10-CM | POA: Diagnosis not present

## 2017-09-03 DIAGNOSIS — K5190 Ulcerative colitis, unspecified, without complications: Secondary | ICD-10-CM | POA: Diagnosis not present

## 2017-09-03 DIAGNOSIS — E039 Hypothyroidism, unspecified: Secondary | ICD-10-CM | POA: Diagnosis not present

## 2017-09-03 DIAGNOSIS — E669 Obesity, unspecified: Secondary | ICD-10-CM | POA: Diagnosis not present

## 2017-09-03 DIAGNOSIS — I10 Essential (primary) hypertension: Secondary | ICD-10-CM | POA: Diagnosis not present

## 2017-09-03 DIAGNOSIS — F322 Major depressive disorder, single episode, severe without psychotic features: Secondary | ICD-10-CM | POA: Diagnosis not present

## 2017-09-03 DIAGNOSIS — G4700 Insomnia, unspecified: Secondary | ICD-10-CM | POA: Diagnosis not present

## 2017-09-05 ENCOUNTER — Encounter: Payer: Self-pay | Admitting: *Deleted

## 2017-09-11 ENCOUNTER — Other Ambulatory Visit: Payer: Self-pay

## 2017-09-11 ENCOUNTER — Ambulatory Visit
Admission: RE | Admit: 2017-09-11 | Discharge: 2017-09-11 | Disposition: A | Discharge status: Home / Self Care | Payer: Medicare HMO | Source: Ambulatory Visit | Attending: Ophthalmology | Admitting: Ophthalmology

## 2017-09-11 ENCOUNTER — Ambulatory Visit: Payer: Medicare HMO | Admitting: Anesthesiology

## 2017-09-11 ENCOUNTER — Encounter
Admission: RE | Disposition: A | Discharge status: Home / Self Care | Payer: Self-pay | Source: Ambulatory Visit | Attending: Ophthalmology

## 2017-09-11 ENCOUNTER — Encounter: Payer: Self-pay | Admitting: *Deleted

## 2017-09-11 DIAGNOSIS — Z79899 Other long term (current) drug therapy: Secondary | ICD-10-CM | POA: Diagnosis not present

## 2017-09-11 DIAGNOSIS — Z79890 Hormone replacement therapy (postmenopausal): Secondary | ICD-10-CM | POA: Diagnosis not present

## 2017-09-11 DIAGNOSIS — K219 Gastro-esophageal reflux disease without esophagitis: Secondary | ICD-10-CM | POA: Diagnosis not present

## 2017-09-11 DIAGNOSIS — Z87891 Personal history of nicotine dependence: Secondary | ICD-10-CM | POA: Diagnosis not present

## 2017-09-11 DIAGNOSIS — Z7951 Long term (current) use of inhaled steroids: Secondary | ICD-10-CM | POA: Diagnosis not present

## 2017-09-11 DIAGNOSIS — E785 Hyperlipidemia, unspecified: Secondary | ICD-10-CM | POA: Diagnosis not present

## 2017-09-11 DIAGNOSIS — L409 Psoriasis, unspecified: Secondary | ICD-10-CM | POA: Diagnosis not present

## 2017-09-11 DIAGNOSIS — F329 Major depressive disorder, single episode, unspecified: Secondary | ICD-10-CM | POA: Diagnosis not present

## 2017-09-11 DIAGNOSIS — E7800 Pure hypercholesterolemia, unspecified: Secondary | ICD-10-CM | POA: Diagnosis not present

## 2017-09-11 DIAGNOSIS — E039 Hypothyroidism, unspecified: Secondary | ICD-10-CM | POA: Insufficient documentation

## 2017-09-11 DIAGNOSIS — H2511 Age-related nuclear cataract, right eye: Secondary | ICD-10-CM | POA: Diagnosis not present

## 2017-09-11 DIAGNOSIS — I10 Essential (primary) hypertension: Secondary | ICD-10-CM | POA: Diagnosis not present

## 2017-09-11 HISTORY — PX: CATARACT EXTRACTION W/PHACO: SHX586

## 2017-09-11 HISTORY — DX: Dyspnea, unspecified: R06.00

## 2017-09-11 SURGERY — PHACOEMULSIFICATION, CATARACT, WITH IOL INSERTION
Anesthesia: Monitor Anesthesia Care | Site: Eye | Laterality: Right | Wound class: Clean

## 2017-09-11 MED ORDER — FENTANYL CITRATE (PF) 100 MCG/2ML IJ SOLN
INTRAMUSCULAR | Status: DC | PRN
  Administered 2017-09-11: 50 ug via INTRAVENOUS

## 2017-09-11 MED ORDER — POLYMYXIN B-TRIMETHOPRIM 10000-0.1 UNIT/ML-% OP SOLN: 1.0000 [drp] | OPHTHALMIC | Status: DC | PRN

## 2017-09-11 MED ORDER — ARMC OPHTHALMIC DILATING DROPS
1.0000 "application " | OPHTHALMIC | Status: AC
  Administered 2017-09-11 (×2): 1 via OPHTHALMIC

## 2017-09-11 MED ORDER — POLYMYXIN B-TRIMETHOPRIM 10000-0.1 UNIT/ML-% OP SOLN
OPHTHALMIC | Status: DC | PRN
  Administered 2017-09-11: 1 [drp]

## 2017-09-11 MED ORDER — POLYMYXIN B-TRIMETHOPRIM 10000-0.1 UNIT/ML-% OP SOLN
OPHTHALMIC | Status: AC
  Filled 2017-09-11: qty 10

## 2017-09-11 MED ORDER — FENTANYL CITRATE (PF) 100 MCG/2ML IJ SOLN
INTRAMUSCULAR | Status: AC
  Filled 2017-09-11: qty 2

## 2017-09-11 MED ORDER — LIDOCAINE HCL (PF) 4 % IJ SOLN
INTRAMUSCULAR | Status: AC
  Filled 2017-09-11: qty 5

## 2017-09-11 MED ORDER — SODIUM CHLORIDE 0.9 % IV SOLN
INTRAVENOUS | Status: DC
  Administered 2017-09-11 (×2): via INTRAVENOUS

## 2017-09-11 MED ORDER — POVIDONE-IODINE 5 % OP SOLN
OPHTHALMIC | Status: AC
  Filled 2017-09-11: qty 30

## 2017-09-11 MED ORDER — CARBACHOL 0.01 % IO SOLN
INTRAOCULAR | Status: DC | PRN
  Administered 2017-09-11: 0.5 mL via INTRAOCULAR

## 2017-09-11 MED ORDER — MIDAZOLAM HCL 2 MG/2ML IJ SOLN
INTRAMUSCULAR | Status: DC | PRN
  Administered 2017-09-11: 2 mg via INTRAVENOUS

## 2017-09-11 MED ORDER — MIDAZOLAM HCL 2 MG/2ML IJ SOLN
INTRAMUSCULAR | Status: AC
  Filled 2017-09-11: qty 2

## 2017-09-11 MED ORDER — POVIDONE-IODINE 5 % OP SOLN
OPHTHALMIC | Status: DC | PRN
  Administered 2017-09-11: 1 via OPHTHALMIC

## 2017-09-11 MED ORDER — NA CHONDROIT SULF-NA HYALURON 40-17 MG/ML IO SOLN
INTRAOCULAR | Status: AC
  Filled 2017-09-11: qty 1

## 2017-09-11 MED ORDER — CEFUROXIME OPHTHALMIC INJECTION 1 MG/0.1 ML
INJECTION | OPHTHALMIC | Status: DC | PRN
  Administered 2017-09-11: 1 mg via INTRACAMERAL

## 2017-09-11 MED ORDER — EPINEPHRINE PF 1 MG/ML IJ SOLN
INTRAMUSCULAR | Status: AC
  Filled 2017-09-11: qty 2

## 2017-09-11 MED ORDER — NA CHONDROIT SULF-NA HYALURON 40-17 MG/ML IO SOLN
INTRAOCULAR | Status: DC | PRN
  Administered 2017-09-11: 1 mL via INTRAOCULAR

## 2017-09-11 MED ORDER — LIDOCAINE HCL (PF) 4 % IJ SOLN
INTRAOCULAR | Status: DC | PRN
  Administered 2017-09-11: 4 mL via OPHTHALMIC

## 2017-09-11 MED ORDER — EPINEPHRINE PF 1 MG/ML IJ SOLN
INTRAOCULAR | Status: DC | PRN
  Administered 2017-09-11: 09:00:00 via OPHTHALMIC

## 2017-09-11 MED ORDER — ARMC OPHTHALMIC DILATING DROPS
OPHTHALMIC | Status: AC
  Administered 2017-09-11: 1 via OPHTHALMIC
  Filled 2017-09-11: qty 0.4

## 2017-09-11 SURGICAL SUPPLY — 19 items
GLOVE BIO SURGEON STRL SZ8 (GLOVE) ×2 IMPLANT
GLOVE BIOGEL M 6.5 STRL (GLOVE) ×2 IMPLANT
GLOVE SURG LX 8.0 MICRO (GLOVE) ×1
GLOVE SURG LX STRL 8.0 MICRO (GLOVE) ×1 IMPLANT
GOWN STRL REUS W/ TWL LRG LVL3 (GOWN DISPOSABLE) ×2 IMPLANT
GOWN STRL REUS W/TWL LRG LVL3 (GOWN DISPOSABLE) ×4
LABEL CATARACT MEDS ST (LABEL) ×2 IMPLANT
LENS IOL TECNIS ITEC 25.0 (Intraocular Lens) ×1 IMPLANT
NDL SAFETY ECLIPSE 18X1.5 (NEEDLE) IMPLANT
NEEDLE HYPO 18GX1.5 SHARP (NEEDLE) ×2
PACK CATARACT (MISCELLANEOUS) ×2 IMPLANT
PACK CATARACT BRASINGTON LX (MISCELLANEOUS) ×2 IMPLANT
PACK EYE AFTER SURG (MISCELLANEOUS) ×2 IMPLANT
SOL BSS BAG (MISCELLANEOUS) ×2
SOLUTION BSS BAG (MISCELLANEOUS) ×1 IMPLANT
SYR 3ML LL SCALE MARK (SYRINGE) ×1 IMPLANT
SYR 5ML LL (SYRINGE) ×2 IMPLANT
WATER STERILE IRR 250ML POUR (IV SOLUTION) ×2 IMPLANT
WIPE NON LINTING 3.25X3.25 (MISCELLANEOUS) ×2 IMPLANT

## 2017-09-11 NOTE — Anesthesia Post-op Follow-up Note (Signed)
Anesthesia QCDR form completed.        

## 2017-09-11 NOTE — Op Note (Signed)
PREOPERATIVE DIAGNOSIS:  Nuclear sclerotic cataract of the right eye.   POSTOPERATIVE DIAGNOSIS: NUCLEAR SCLEROTIC CATARACT RIGHT EYE   OPERATIVE PROCEDURE:  Procedure(s): CATARACT EXTRACTION PHACO AND INTRAOCULAR LENS PLACEMENT (I[facility])   SURGEON:  Birder Robson, MD.   ANESTHESIA:  Anesthesiologist: Martha Clan, MD  1.      Managed anesthesia care. 2.      Topical tetracaine drops followed by 2% Xylocaine jelly applied in the preoperative holding area.   COMPLICATIONS:  None.   TECHNIQUE:   Stop and chop   DESCRIPTION OF PROCEDURE:  The patient was examined and consented in the preoperative holding area where the aforementioned topical anesthesia was applied to the right eye and then brought back to the Operating Room where the right eye was prepped and draped in the usual sterile ophthalmic fashion and a lid speculum was placed. A paracentesis was created with the side port blade and the anterior chamber was filled with viscoelastic. A near clear corneal incision was performed with the steel keratome. A continuous curvilinear capsulorrhexis was performed with a cystotome followed by the capsulorrhexis forceps. Hydrodissection and hydrodelineation were carried out with BSS on a blunt cannula. The lens was removed in a stop and chop  technique and the remaining cortical material was removed with the irrigation-aspiration handpiece. The capsular bag was inflated with viscoelastic and the Technis ZCB00  lens was placed in the capsular bag without complication. The remaining viscoelastic was removed from the eye with the irrigation-aspiration handpiece. The wounds were hydrated. The anterior chamber was flushed with Miostat and the eye was inflated to physiologic pressure. 0.1 mL of cefuroxime concentration 10 mg/mL was placed in the anterior chamber. The wounds were found to be water tight. The eye was dressed with Vigamox. The patient was given protective glasses to wear throughout the day and  a shield with which to sleep tonight. The patient was also given drops with which to begin a drop regimen today and will follow-up with me in one day. Implant Name Type Inv. Item Serial No. Manufacturer Lot No. LRB No. Used  LENS IOL DIOP 25.0 - [id number] Intraocular Lens LENS IOL DIOP 25.0 [id number] AMO  Right 1   Procedure(s) with comments: CATARACT EXTRACTION PHACO AND INTRAOCULAR LENS PLACEMENT ([facility]) (Right) - [place] 00:18.5 AP% 6.9 CDE 1.29 Fluid Pack Lot # [id number]  Electronically signed: Birder Robson 09/11/2017 9:17 AM

## 2017-09-11 NOTE — Anesthesia Procedure Notes (Signed)
Procedure Name: MAC Date/Time: 09/11/2017 8:55 AM Performed by: Allean Found, CRNA Pre-anesthesia Checklist: Patient identified, Emergency Drugs available, Suction available, Patient being monitored and Timeout performed Patient Re-evaluated:Patient Re-evaluated prior to induction Oxygen Delivery Method: Nasal cannula Placement Confirmation: positive ETCO2

## 2017-09-11 NOTE — Anesthesia Postprocedure Evaluation (Signed)
Anesthesia Post Note  Patient: [name]  Procedure(s) Performed: CATARACT EXTRACTION PHACO AND INTRAOCULAR LENS PLACEMENT ([place]) (Right Eye)  Patient location during evaluation: PACU Anesthesia Type: MAC Level of consciousness: awake, awake and alert and oriented Pain management: pain level controlled Vital Signs Assessment: post-procedure vital signs reviewed and stable Respiratory status: spontaneous breathing Cardiovascular status: blood pressure returned to baseline Postop Assessment: no headache Anesthetic complications: no     Last Vitals:  Vitals:   09/11/17 0753  BP: 126/73  Pulse: 79  Resp: 18  Temp: (!) 35.7 C  SpO2: 97%    Last Pain:  Vitals:   09/11/17 0753  TempSrc: Tympanic                 Philbert Riser

## 2017-09-11 NOTE — H&P (Signed)
All labs reviewed. Abnormal studies sent to patients PCP when indicated.  Previous H&P reviewed, patient examined, there are NO CHANGES.  Carla Riker Porfilio2/26/20198:49 AM

## 2017-09-11 NOTE — Transfer of Care (Signed)
Immediate Anesthesia Transfer of Care Note  Patient: [name]  Procedure(s) Performed: CATARACT EXTRACTION PHACO AND INTRAOCULAR LENS PLACEMENT ([facility]) (Right Eye)  Patient Location: PACU  Anesthesia Type:MAC  Level of Consciousness: awake, alert  and oriented  Airway & Oxygen Therapy: Patient Spontanous Breathing  Post-op Assessment: Report given to RN  Post vital signs: Reviewed and stable   Last Vitals:  Vitals:   09/11/17 0753  BP: 126/73  Pulse: 79  Resp: 18  Temp: (!) 35.7 C  SpO2: 97%    Last Pain:  Vitals:   09/11/17 0753  TempSrc: Tympanic         Complications: No apparent anesthesia complications

## 2017-09-11 NOTE — Discharge Instructions (Signed)
Eye Surgery Discharge Instructions  Expect mild scratchy sensation or mild soreness. DO NOT RUB YOUR EYE!  The day of surgery:  Minimal physical activity, but bed rest is not required  No reading, computer work, or close hand work  No bending, lifting, or straining.  May watch TV  For 24 hours:  No driving, legal decisions, or alcoholic beverages  Safety precautions  Eat anything you prefer: It is better to start with liquids, then soup then solid foods.  _____ Eye patch should be worn until postoperative exam tomorrow.  ____ Solar shield eyeglasses should be worn for comfort in the sunlight/patch while sleeping  Resume all regular medications including aspirin or Coumadin if these were discontinued prior to surgery. You may shower, bathe, shave, or wash your hair. Tylenol may be taken for mild discomfort.  Call your doctor if you experience significant pain, nausea, or vomiting, fever > 101 or other signs of infection. [phone] or [phone] Specific instructions:  Follow-up Information    Birder Robson, MD Follow up on 09/12/2017.   Specialty:  Ophthalmology Why:  8:50 Contact information: [address] [place] [address] [phone]

## 2017-09-11 NOTE — Anesthesia Preprocedure Evaluation (Signed)
Anesthesia Evaluation  Patient identified by MRN, date of birth, ID band Patient awake    Reviewed: Allergy & Precautions, H&P , NPO status , Patient's Chart, lab work & pertinent test results, reviewed documented beta blocker date and time   History of Anesthesia Complications (+) PONV and history of anesthetic complications  Airway Mallampati: II  TM Distance: >3 FB Neck ROM: full    Dental  (+) Caps, Dental Advidsory Given   Pulmonary shortness of breath and with exertion, neg sleep apnea, neg COPD, neg recent URI, former smoker,           Cardiovascular Exercise Tolerance: Good hypertension, (-) angina(-) CAD, (-) Past MI, (-) Cardiac Stents and (-) CABG negative cardio ROS  (-) dysrhythmias (-) Valvular Problems/Murmurs     Neuro/Psych PSYCHIATRIC DISORDERS Depression negative neurological ROS     GI/Hepatic Neg liver ROS, hiatal hernia, PUD, GERD  ,  Endo/Other  neg diabetesHypothyroidism   Renal/GU negative Renal ROS  negative genitourinary   Musculoskeletal   Abdominal   Peds  Hematology negative hematology ROS (+)   Anesthesia Other Findings Past Medical History: No date: Arthritis No date: Depression     Comment:  treated No date: Dyspnea No date: Fibrocystic breast disease No date: GERD (gastroesophageal reflux disease) No date: History of hiatal hernia     Comment:  repaired No date: History of kidney stones     Comment:  questionable No date: Hyperlipidemia No date: Hypertension No date: Hypothyroidism No date: Irritable bowel syndrome No date: Ovarian cyst No date: Pancreatitis No date: PONV (postoperative nausea and vomiting) No date: Psoriasis 05/12/2016: Psoriatic arthritis (HCC)     Comment:  Poor response to Humira Inadequate response to Enbrel               Inadequate response to Simponi No date: Tachycardia No date: Ulcerative colitis   Reproductive/Obstetrics negative OB  ROS                             Anesthesia Physical Anesthesia Plan  ASA: III  Anesthesia Plan: MAC   Post-op Pain Management:    Induction:   PONV Risk Score and Plan:   Airway Management Planned: Nasal Cannula  Additional Equipment:   Intra-op Plan:   Post-operative Plan:   Informed Consent: I have reviewed the patients History and Physical, chart, labs and discussed the procedure including the risks, benefits and alternatives for the proposed anesthesia with the patient or authorized representative who has indicated his/her understanding and acceptance.   Dental Advisory Given  Plan Discussed with: Anesthesiologist, CRNA and Surgeon  Anesthesia Plan Comments:         Anesthesia Quick Evaluation

## 2017-09-12 ENCOUNTER — Encounter: Payer: Self-pay | Admitting: Ophthalmology

## 2017-09-20 DIAGNOSIS — R062 Wheezing: Secondary | ICD-10-CM | POA: Diagnosis not present

## 2017-09-20 DIAGNOSIS — J4530 Mild persistent asthma, uncomplicated: Secondary | ICD-10-CM | POA: Diagnosis not present

## 2017-09-20 DIAGNOSIS — J3089 Other allergic rhinitis: Secondary | ICD-10-CM | POA: Diagnosis not present

## 2017-09-20 DIAGNOSIS — J301 Allergic rhinitis due to pollen: Secondary | ICD-10-CM | POA: Diagnosis not present

## 2017-09-24 DIAGNOSIS — H2512 Age-related nuclear cataract, left eye: Secondary | ICD-10-CM | POA: Diagnosis not present

## 2017-09-27 ENCOUNTER — Encounter: Payer: Self-pay | Admitting: *Deleted

## 2017-10-02 ENCOUNTER — Encounter: Payer: Self-pay | Admitting: *Deleted

## 2017-10-02 ENCOUNTER — Ambulatory Visit: Payer: Medicare HMO | Admitting: Certified Registered Nurse Anesthetist

## 2017-10-02 ENCOUNTER — Other Ambulatory Visit: Payer: Self-pay

## 2017-10-02 ENCOUNTER — Ambulatory Visit
Admission: RE | Admit: 2017-10-02 | Discharge: 2017-10-02 | Disposition: A | Discharge status: Home / Self Care | Payer: Medicare HMO | Source: Ambulatory Visit | Attending: Ophthalmology | Admitting: Ophthalmology

## 2017-10-02 ENCOUNTER — Encounter
Admission: RE | Disposition: A | Discharge status: Home / Self Care | Payer: Self-pay | Source: Ambulatory Visit | Attending: Ophthalmology

## 2017-10-02 DIAGNOSIS — Z885 Allergy status to narcotic agent status: Secondary | ICD-10-CM | POA: Diagnosis not present

## 2017-10-02 DIAGNOSIS — L4050 Arthropathic psoriasis, unspecified: Secondary | ICD-10-CM | POA: Insufficient documentation

## 2017-10-02 DIAGNOSIS — Z6832 Body mass index (BMI) 32.0-32.9, adult: Secondary | ICD-10-CM | POA: Insufficient documentation

## 2017-10-02 DIAGNOSIS — Z8711 Personal history of peptic ulcer disease: Secondary | ICD-10-CM | POA: Diagnosis not present

## 2017-10-02 DIAGNOSIS — E669 Obesity, unspecified: Secondary | ICD-10-CM | POA: Insufficient documentation

## 2017-10-02 DIAGNOSIS — I10 Essential (primary) hypertension: Secondary | ICD-10-CM | POA: Diagnosis not present

## 2017-10-02 DIAGNOSIS — K589 Irritable bowel syndrome without diarrhea: Secondary | ICD-10-CM | POA: Insufficient documentation

## 2017-10-02 DIAGNOSIS — H2511 Age-related nuclear cataract, right eye: Secondary | ICD-10-CM | POA: Diagnosis not present

## 2017-10-02 DIAGNOSIS — E039 Hypothyroidism, unspecified: Secondary | ICD-10-CM | POA: Insufficient documentation

## 2017-10-02 DIAGNOSIS — Z91040 Latex allergy status: Secondary | ICD-10-CM | POA: Insufficient documentation

## 2017-10-02 DIAGNOSIS — J45909 Unspecified asthma, uncomplicated: Secondary | ICD-10-CM | POA: Insufficient documentation

## 2017-10-02 DIAGNOSIS — M1990 Unspecified osteoarthritis, unspecified site: Secondary | ICD-10-CM | POA: Diagnosis not present

## 2017-10-02 DIAGNOSIS — Z87891 Personal history of nicotine dependence: Secondary | ICD-10-CM | POA: Insufficient documentation

## 2017-10-02 DIAGNOSIS — F329 Major depressive disorder, single episode, unspecified: Secondary | ICD-10-CM | POA: Diagnosis not present

## 2017-10-02 DIAGNOSIS — Z881 Allergy status to other antibiotic agents status: Secondary | ICD-10-CM | POA: Insufficient documentation

## 2017-10-02 DIAGNOSIS — Z882 Allergy status to sulfonamides status: Secondary | ICD-10-CM | POA: Insufficient documentation

## 2017-10-02 DIAGNOSIS — H2512 Age-related nuclear cataract, left eye: Secondary | ICD-10-CM | POA: Insufficient documentation

## 2017-10-02 DIAGNOSIS — E7800 Pure hypercholesterolemia, unspecified: Secondary | ICD-10-CM | POA: Insufficient documentation

## 2017-10-02 DIAGNOSIS — K219 Gastro-esophageal reflux disease without esophagitis: Secondary | ICD-10-CM | POA: Insufficient documentation

## 2017-10-02 DIAGNOSIS — Z888 Allergy status to other drugs, medicaments and biological substances status: Secondary | ICD-10-CM | POA: Insufficient documentation

## 2017-10-02 DIAGNOSIS — K449 Diaphragmatic hernia without obstruction or gangrene: Secondary | ICD-10-CM | POA: Diagnosis not present

## 2017-10-02 DIAGNOSIS — Z79899 Other long term (current) drug therapy: Secondary | ICD-10-CM | POA: Diagnosis not present

## 2017-10-02 HISTORY — PX: CATARACT EXTRACTION W/PHACO: SHX586

## 2017-10-02 HISTORY — DX: Unspecified asthma, uncomplicated: J45.909

## 2017-10-02 SURGERY — PHACOEMULSIFICATION, CATARACT, WITH IOL INSERTION
Anesthesia: Monitor Anesthesia Care | Site: Eye | Laterality: Left | Wound class: Clean

## 2017-10-02 MED ORDER — EPINEPHRINE PF 1 MG/ML IJ SOLN
INTRAMUSCULAR | Status: AC
  Filled 2017-10-02: qty 2

## 2017-10-02 MED ORDER — MIDAZOLAM HCL 2 MG/2ML IJ SOLN
INTRAMUSCULAR | Status: DC | PRN
  Administered 2017-10-02: 0.5 mg via INTRAVENOUS
  Administered 2017-10-02: 1 mg via INTRAVENOUS
  Administered 2017-10-02: 0.5 mg via INTRAVENOUS

## 2017-10-02 MED ORDER — ARMC OPHTHALMIC DILATING DROPS
OPHTHALMIC | Status: AC
  Filled 2017-10-02: qty 0.4

## 2017-10-02 MED ORDER — NA CHONDROIT SULF-NA HYALURON 40-17 MG/ML IO SOLN
INTRAOCULAR | Status: AC
  Filled 2017-10-02: qty 1

## 2017-10-02 MED ORDER — POVIDONE-IODINE 5 % OP SOLN
OPHTHALMIC | Status: DC | PRN
  Administered 2017-10-02: 1 via OPHTHALMIC

## 2017-10-02 MED ORDER — CARBACHOL 0.01 % IO SOLN
INTRAOCULAR | Status: DC | PRN
  Administered 2017-10-02: 0.5 mL via INTRAOCULAR

## 2017-10-02 MED ORDER — EPINEPHRINE PF 1 MG/ML IJ SOLN
INTRAOCULAR | Status: DC | PRN
  Administered 2017-10-02: 12:00:00 via OPHTHALMIC

## 2017-10-02 MED ORDER — LIDOCAINE HCL (PF) 4 % IJ SOLN
INTRAMUSCULAR | Status: AC
  Filled 2017-10-02: qty 5

## 2017-10-02 MED ORDER — ARMC OPHTHALMIC DILATING DROPS
1.0000 "application " | OPHTHALMIC | Status: AC
  Administered 2017-10-02 (×2): 1 via OPHTHALMIC
  Administered 2017-10-02: 12:00:00 via OPHTHALMIC

## 2017-10-02 MED ORDER — NA CHONDROIT SULF-NA HYALURON 40-17 MG/ML IO SOLN
INTRAOCULAR | Status: DC | PRN
  Administered 2017-10-02: 1 mL via INTRAOCULAR

## 2017-10-02 MED ORDER — POLYMYXIN B-TRIMETHOPRIM 10000-0.1 UNIT/ML-% OP SOLN
OPHTHALMIC | Status: AC
  Filled 2017-10-02: qty 10

## 2017-10-02 MED ORDER — MIDAZOLAM HCL 2 MG/2ML IJ SOLN
INTRAMUSCULAR | Status: AC
  Filled 2017-10-02: qty 2

## 2017-10-02 MED ORDER — POVIDONE-IODINE 5 % OP SOLN
OPHTHALMIC | Status: AC
  Filled 2017-10-02: qty 30

## 2017-10-02 MED ORDER — POLYMYXIN B-TRIMETHOPRIM 10000-0.1 UNIT/ML-% OP SOLN: 1.0000 [drp] | OPHTHALMIC | Status: DC | PRN

## 2017-10-02 MED ORDER — LIDOCAINE HCL (PF) 4 % IJ SOLN
INTRAOCULAR | Status: DC | PRN
  Administered 2017-10-02: 4 mL via OPHTHALMIC

## 2017-10-02 MED ORDER — CEFUROXIME OPHTHALMIC INJECTION 1 MG/0.1 ML
INJECTION | OPHTHALMIC | Status: DC | PRN
  Administered 2017-10-02: 1 mg via INTRACAMERAL

## 2017-10-02 MED ORDER — POLYMYXIN B-TRIMETHOPRIM 10000-0.1 UNIT/ML-% OP SOLN
OPHTHALMIC | Status: DC | PRN
  Administered 2017-10-02: 2 [drp]

## 2017-10-02 MED ORDER — SODIUM CHLORIDE 0.9 % IV SOLN
INTRAVENOUS | Status: DC
  Administered 2017-10-02: 12:00:00 via INTRAVENOUS

## 2017-10-02 SURGICAL SUPPLY — 16 items
GLOVE BIO SURGEON STRL SZ8 (GLOVE) ×2 IMPLANT
GLOVE BIOGEL M 6.5 STRL (GLOVE) ×2 IMPLANT
GLOVE SURG LX 8.0 MICRO (GLOVE) ×1
GLOVE SURG LX STRL 8.0 MICRO (GLOVE) ×1 IMPLANT
GOWN STRL REUS W/ TWL LRG LVL3 (GOWN DISPOSABLE) ×2 IMPLANT
GOWN STRL REUS W/TWL LRG LVL3 (GOWN DISPOSABLE) ×4
LABEL CATARACT MEDS ST (LABEL) ×2 IMPLANT
LENS IOL TECNIS ITEC 24.0 (Intraocular Lens) ×1 IMPLANT
PACK CATARACT (MISCELLANEOUS) ×2 IMPLANT
PACK CATARACT BRASINGTON LX (MISCELLANEOUS) ×2 IMPLANT
PACK EYE AFTER SURG (MISCELLANEOUS) ×2 IMPLANT
SOL BSS BAG (MISCELLANEOUS) ×2
SOLUTION BSS BAG (MISCELLANEOUS) ×1 IMPLANT
SYR 5ML LL (SYRINGE) ×2 IMPLANT
WATER STERILE IRR 250ML POUR (IV SOLUTION) ×2 IMPLANT
WIPE NON LINTING 3.25X3.25 (MISCELLANEOUS) ×2 IMPLANT

## 2017-10-02 NOTE — Anesthesia Preprocedure Evaluation (Signed)
Anesthesia Evaluation  Patient identified by MRN, date of birth, ID band Patient awake    Reviewed: Allergy & Precautions, NPO status , Patient's Chart, lab work & pertinent test results  History of Anesthesia Complications (+) PONV and history of anesthetic complications  Airway Mallampati: III  TM Distance: >3 FB Neck ROM: Full    Dental no notable dental hx.    Pulmonary asthma , neg sleep apnea, neg COPD, former smoker,    breath sounds clear to auscultation- rhonchi (-) wheezing      Cardiovascular hypertension, Pt. on medications (-) CAD, (-) Past MI, (-) Cardiac Stents and (-) CABG  Rhythm:Regular Rate:Normal - Systolic murmurs and - Diastolic murmurs    Neuro/Psych PSYCHIATRIC DISORDERS Depression negative neurological ROS     GI/Hepatic Neg liver ROS, hiatal hernia, PUD, GERD  ,  Endo/Other  neg diabetesHypothyroidism   Renal/GU negative Renal ROS     Musculoskeletal  (+) Arthritis ,   Abdominal (+) + obese,   Peds  Hematology negative hematology ROS (+)   Anesthesia Other Findings Past Medical History: No date: Arthritis No date: Asthma No date: Depression     Comment:  treated No date: Dyspnea No date: Fibrocystic breast disease No date: GERD (gastroesophageal reflux disease) No date: History of hiatal hernia     Comment:  repaired No date: History of kidney stones     Comment:  questionable No date: Hyperlipidemia No date: Hypertension No date: Hypothyroidism No date: Irritable bowel syndrome No date: Ovarian cyst No date: Pancreatitis No date: PONV (postoperative nausea and vomiting)     Comment:  NO TROUBLE WITH CATARACT SURGERY No date: Psoriasis 05/12/2016: Psoriatic arthritis (HCC)     Comment:  Poor response to Humira Inadequate response to Enbrel               Inadequate response to Simponi No date: Tachycardia No date: Ulcerative colitis   Reproductive/Obstetrics                              Anesthesia Physical Anesthesia Plan  ASA: III  Anesthesia Plan: MAC   Post-op Pain Management:    Induction: Intravenous  PONV Risk Score and Plan: 3 and Midazolam  Airway Management Planned: Natural Airway  Additional Equipment:   Intra-op Plan:   Post-operative Plan:   Informed Consent: I have reviewed the patients History and Physical, chart, labs and discussed the procedure including the risks, benefits and alternatives for the proposed anesthesia with the patient or authorized representative who has indicated his/her understanding and acceptance.     Plan Discussed with: CRNA and Anesthesiologist  Anesthesia Plan Comments:         Anesthesia Quick Evaluation

## 2017-10-02 NOTE — Anesthesia Postprocedure Evaluation (Signed)
Anesthesia Post Note  Patient: MONQUE HAGGAR  Procedure(s) Performed: CATARACT EXTRACTION PHACO AND INTRAOCULAR LENS PLACEMENT ([place]) (Left Eye)  Patient location during evaluation: PACU Anesthesia Type: MAC Level of consciousness: awake and alert and oriented Pain management: pain level controlled Vital Signs Assessment: post-procedure vital signs reviewed and stable Respiratory status: spontaneous breathing, nonlabored ventilation and respiratory function stable Cardiovascular status: blood pressure returned to baseline and stable Postop Assessment: no signs of nausea or vomiting Anesthetic complications: no     Last Vitals:  Vitals:   10/02/17 1128 10/02/17 1232  BP: 139/79 115/65  Pulse: 75 72  Resp: 16 16  Temp: 36.5 C (!) 36.4 C  SpO2: 100% 100%    Last Pain:  Vitals:   10/02/17 1232  TempSrc: Temporal  PainSc: 0-No pain                 Amahia Madonia

## 2017-10-02 NOTE — Transfer of Care (Signed)
Immediate Anesthesia Transfer of Care Note  Patient: [name]  Procedure(s) Performed: CATARACT EXTRACTION PHACO AND INTRAOCULAR LENS PLACEMENT ([facility]) (Left Eye)  Patient Location: Short Stay  Anesthesia Type:MAC  Level of Consciousness: awake, alert , oriented and patient cooperative  Airway & Oxygen Therapy: Patient Spontanous Breathing  Post-op Assessment: Report given to RN and Post -op Vital signs reviewed and stable  Post vital signs: Reviewed and stable  Last Vitals:  Vitals:   10/02/17 1128 10/02/17 1232  BP: 139/79 115/65  Pulse: 75 72  Resp: 16 16  Temp: 36.5 C (!) 36.4 C  SpO2: 100% 100%    Last Pain:  Vitals:   10/02/17 1232  TempSrc: Temporal  PainSc: 0-No pain         Complications: No apparent anesthesia complications

## 2017-10-02 NOTE — H&P (Signed)
All labs reviewed. Abnormal studies sent to patients PCP when indicated.  Previous H&P reviewed, patient examined, there are NO CHANGES.  Alanna Storti Porfilio3/19/201912:04 PM

## 2017-10-02 NOTE — Discharge Instructions (Signed)
FOLLOW DR. PORFILIO'S POSTOP EYE DROP INSTRUCTION SHEET AS REVIEWED.  Eye Surgery Discharge Instructions  Expect mild scratchy sensation or mild soreness. DO NOT RUB YOUR EYE!  The day of surgery:  Minimal physical activity, but bed rest is not required  No reading, computer work, or close hand work  No bending, lifting, or straining.  May watch TV  For 24 hours:  No driving, legal decisions, or alcoholic beverages  Safety precautions  Eat anything you prefer: It is better to start with liquids, then soup then solid foods.  _____ Eye patch should be worn until postoperative exam tomorrow.  ____ Solar shield eyeglasses should be worn for comfort in the sunlight/patch while sleeping  Resume all regular medications including aspirin or Coumadin if these were discontinued prior to surgery. You may shower, bathe, shave, or wash your hair. Tylenol may be taken for mild discomfort.  Call your doctor if you experience significant pain, nausea, or vomiting, fever > 101 or other signs of infection. [phone] or [phone] Specific instructions:  Follow-up Information    Birder Robson, MD Follow up.   Specialty:  Ophthalmology Why:  Wednesday 10/03/17 @ 10:35 am Contact information: [address] [place] [address] [phone]

## 2017-10-02 NOTE — Anesthesia Post-op Follow-up Note (Signed)
Anesthesia QCDR form completed.        

## 2017-10-02 NOTE — Op Note (Signed)
PREOPERATIVE DIAGNOSIS:  Nuclear sclerotic cataract of the left eye.   POSTOPERATIVE DIAGNOSIS:  NUCLEAR SCLORTIC CATARACT LEFT EYE   OPERATIVE PROCEDURE:  Procedure(s): CATARACT EXTRACTION PHACO AND INTRAOCULAR LENS PLACEMENT ([facility])   SURGEON:  Birder Robson, MD.   ANESTHESIA:   Anesthesiologist: Emmie Niemann, MD CRNA: Eben Burow, CRNA  1.      Managed anesthesia care. 2.      Topical tetracaine drops followed by 2% Xylocaine jelly applied in the preoperative holding area.   COMPLICATIONS:  None.   TECHNIQUE:   Stop and chop   DESCRIPTION OF PROCEDURE:  The patient was examined and consented in the preoperative holding area where the aforementioned topical anesthesia was applied to the left eye and then brought back to the Operating Room where the left eye was prepped and draped in the usual sterile ophthalmic fashion and a lid speculum was placed. A paracentesis was created with the side port blade and the anterior chamber was filled with viscoelastic. A near clear corneal incision was performed with the steel keratome. A continuous curvilinear capsulorrhexis was performed with a cystotome followed by the capsulorrhexis forceps. Hydrodissection and hydrodelineation were carried out with BSS on a blunt cannula. The lens was removed in a stop and chop  technique and the remaining cortical material was removed with the irrigation-aspiration handpiece. The capsular bag was inflated with viscoelastic and the Technis ZCB00 lens was placed in the capsular bag without complication. The remaining viscoelastic was removed from the eye with the irrigation-aspiration handpiece. The wounds were hydrated. The anterior chamber was flushed with Miostat and the eye was inflated to physiologic pressure. 0.1 mL of cefuroxime concentration 10 mg/mL was placed in the anterior chamber. The wounds were found to be water tight. The eye was dressed with Polytrim. The patient was given protective glasses to  wear throughout the day and a shield with which to sleep tonight. The patient was also given drops with which to begin a drop regimen today and will follow-up with me in one day. Implant Name Type Inv. Item Serial No. Manufacturer Lot No. LRB No. Used  LENS IOL DIOP 24.0 - [id number] Intraocular Lens LENS IOL DIOP 24.0 [id number] AMO  Left 1   Procedure(s) with comments: CATARACT EXTRACTION PHACO AND INTRAOCULAR LENS PLACEMENT ([facility]) (Left) - [place] 00:25.9 AP% 6.9 CDE 1.80 Fluid Pack Lot # [id number]  Electronically signed: Birder Robson 10/02/2017 12:29 PM

## 2017-10-29 ENCOUNTER — Other Ambulatory Visit: Payer: Self-pay

## 2017-10-29 ENCOUNTER — Ambulatory Visit (AMBULATORY_SURGERY_CENTER): Payer: Self-pay | Admitting: *Deleted

## 2017-10-29 ENCOUNTER — Encounter: Payer: Self-pay | Admitting: Gastroenterology

## 2017-10-29 VITALS — Ht 66.0 in | Wt 206.2 lb

## 2017-10-29 DIAGNOSIS — Z86010 Personal history of colonic polyps: Secondary | ICD-10-CM

## 2017-10-29 MED ORDER — NA SULFATE-K SULFATE-MG SULF 17.5-3.13-1.6 GM/177ML PO SOLN: 1.0000 [IU] | Freq: Once | ORAL | 0 refills | Status: AC

## 2017-10-29 NOTE — Progress Notes (Signed)
No egg or soy allergy known to patient  No issues with past sedation with any surgeries  or procedures, no intubation problems  No diet pills per patient No home 02 use per patient  No blood thinners per patient  Pt denies issues with constipation  No A fib or A flutter  EMMI video sent to pt's e mail pt. decliined

## 2017-11-01 DIAGNOSIS — N6311 Unspecified lump in the right breast, upper outer quadrant: Secondary | ICD-10-CM | POA: Diagnosis not present

## 2017-11-12 ENCOUNTER — Encounter: Payer: Self-pay | Admitting: Gastroenterology

## 2017-11-13 ENCOUNTER — Other Ambulatory Visit: Payer: Self-pay | Admitting: Rheumatology

## 2017-11-13 NOTE — Telephone Encounter (Signed)
Last Visit: 06/20/17 Next Visit: 12/06/17  Okay to refill per Dr. Estanislado Pandy

## 2017-11-14 ENCOUNTER — Other Ambulatory Visit: Payer: Self-pay

## 2017-11-14 ENCOUNTER — Ambulatory Visit (AMBULATORY_SURGERY_CENTER): Payer: Medicare HMO | Admitting: Gastroenterology

## 2017-11-14 ENCOUNTER — Encounter: Payer: Self-pay | Admitting: Gastroenterology

## 2017-11-14 VITALS — BP 129/77 | HR 77 | Temp 97.1°F | Resp 15 | Ht 66.0 in | Wt 202.0 lb

## 2017-11-14 DIAGNOSIS — K5100 Ulcerative (chronic) pancolitis without complications: Secondary | ICD-10-CM

## 2017-11-14 DIAGNOSIS — Z86010 Personal history of colonic polyps: Secondary | ICD-10-CM

## 2017-11-14 DIAGNOSIS — I10 Essential (primary) hypertension: Secondary | ICD-10-CM | POA: Diagnosis not present

## 2017-11-14 DIAGNOSIS — K5190 Ulcerative colitis, unspecified, without complications: Secondary | ICD-10-CM | POA: Diagnosis not present

## 2017-11-14 DIAGNOSIS — K589 Irritable bowel syndrome without diarrhea: Secondary | ICD-10-CM | POA: Diagnosis not present

## 2017-11-14 DIAGNOSIS — Z1211 Encounter for screening for malignant neoplasm of colon: Secondary | ICD-10-CM | POA: Diagnosis not present

## 2017-11-14 DIAGNOSIS — K219 Gastro-esophageal reflux disease without esophagitis: Secondary | ICD-10-CM | POA: Diagnosis not present

## 2017-11-14 DIAGNOSIS — E039 Hypothyroidism, unspecified: Secondary | ICD-10-CM | POA: Diagnosis not present

## 2017-11-14 MED ORDER — SODIUM CHLORIDE 0.9 % IV SOLN: 500.0000 mL | Freq: Once | INTRAVENOUS | Status: DC

## 2017-11-14 NOTE — Progress Notes (Signed)
To PACU, vss patent aw report to rn

## 2017-11-14 NOTE — Op Note (Signed)
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 The patient's tolerance of                            previous anesthesia was also reviewed. The risks                            and benefits of the procedure and the sedation                            options and risks were discussed with the patient.                            All questions were answered, and informed consent                            was obtained. Prior Anticoagulants: The patient has                            taken no previous anticoagulant or antiplatelet                            agents. ASA Grade Assessment: II - A patient with                            mild systemic disease. After reviewing the risks  and benefits, the patient was deemed in                            satisfactory condition to undergo the procedure.                           After obtaining informed consent, the colonoscope                            was passed under direct vision. Throughout the                            procedure, the patient's blood pressure, pulse, and                            oxygen saturations were monitored  continuously. The                            Colonoscope was introduced through the anus and                            advanced to the the cecum, identified by                            appendiceal orifice and ileocecal valve. The                            colonoscopy was performed without difficulty. The                            patient tolerated the procedure well. The quality                            of the bowel preparation was excellent. The                            ileocecal valve, appendiceal orifice, and rectum                            were photographed. Scope In: 1:52:33 PM Scope Out: 2:05:40 PM Scope Withdrawal Time: 0 hours 10 minutes 14 seconds  Total Procedure Duration: 0 hours 13 minutes 7 seconds  Findings:                 The perianal and digital rectal examinations were                            normal.                           Non-bleeding internal hemorrhoids were found during                            retroflexion. The hemorrhoids were small.  The exam was otherwise without abnormality. Complications:            No immediate complications. Estimated Blood Loss:     Estimated blood loss: none. Impression:               - Non-bleeding internal hemorrhoids.                           - The examination was otherwise normal.                           - No specimens collected. Recommendation:           - Patient has a contact number available for                            emergencies. The signs and symptoms of potential                            delayed complications were discussed with the                            patient. Return to normal activities tomorrow.                            Written discharge instructions were provided to the                            patient.                           - Resume previous diet.                           - Continue present medications.                           - Repeat colonoscopy in 2  years for surveillance. Mauri Pole, MD 11/14/2017 2:17:54 PM This report has been signed electronically.

## 2017-11-14 NOTE — Patient Instructions (Addendum)
*  Handouts given to patient and care partner on hemorrhoids.  YOU HAD AN ENDOSCOPIC PROCEDURE TODAY AT [facility]:   Refer to the procedure report that was given to you for any specific questions about what was found during the examination.  If the procedure report does not answer your questions, please call your gastroenterologist to clarify.  If you requested that your care partner not be given the details of your procedure findings, then the procedure report has been included in a sealed envelope for you to review at your convenience later.  YOU SHOULD EXPECT: Some feelings of bloating in the abdomen. Passage of more gas than usual.  Walking can help get rid of the air that was put into your GI tract during the procedure and reduce the bloating. If you had a lower endoscopy (such as a colonoscopy or flexible sigmoidoscopy) you may notice spotting of blood in your stool or on the toilet paper. If you underwent a bowel prep for your procedure, you may not have a normal bowel movement for a few days.  Please Note:  You might notice some irritation and congestion in your nose or some drainage.  This is from the oxygen used during your procedure.  There is no need for concern and it should clear up in a day or so.  SYMPTOMS TO REPORT IMMEDIATELY:   Following lower endoscopy (colonoscopy or flexible sigmoidoscopy):  Excessive amounts of blood in the stool  Significant tenderness or worsening of abdominal pains  Swelling of the abdomen that is new, acute  Fever of 100F or higher   For urgent or emergent issues, a gastroenterologist can be reached at any hour by calling [phone].   DIET:  We do recommend a small meal at first, but then you may proceed to your regular diet.  Drink plenty of fluids but you should avoid alcoholic beverages for 24 hours.  ACTIVITY:  You should plan to take it easy for the rest of today and you should NOT DRIVE or use heavy machinery until  tomorrow (because of the sedation medicines used during the test).    FOLLOW UP: Our staff will call the number listed on your records the next business day following your procedure to check on you and address any questions or concerns that you may have regarding the information given to you following your procedure. If we do not reach you, we will leave a message.  However, if you are feeling well and you are not experiencing any problems, there is no need to return our call.  We will assume that you have returned to your regular daily activities without incident.  If any biopsies were taken you will be contacted by phone or by letter within the next 1-3 weeks.  Please call us at [phone] if you have not heard about the biopsies in 3 weeks.    SIGNATURES/CONFIDENTIALITY: You and/or your care partner have signed paperwork which will be entered into your electronic medical record.  These signatures attest to the fact that that the information above on your After Visit Summary has been reviewed and is understood.  Full responsibility of the confidentiality of this discharge information lies with you and/or your care-partner.

## 2017-11-15 ENCOUNTER — Telehealth: Payer: Self-pay | Admitting: *Deleted

## 2017-11-15 NOTE — Telephone Encounter (Signed)
  Follow up Call-  Call back number 11/14/2017 07/27/2015  Post procedure Call Back phone  # [phone]  Permission to leave phone message Yes Yes  Some recent data might be hidden     Patient questions:  Do you have a fever, pain , or abdominal swelling? No. Pain Score  0 *  Have you tolerated food without any problems? Yes.    Have you been able to return to your normal activities? Yes.    Do you have any questions about your discharge instructions: Diet   No. Medications  No. Follow up visit  No.  Do you have questions or concerns about your Care? No.  Actions: * If pain score is 4 or above: No action needed, pain <4.

## 2017-11-23 NOTE — Progress Notes (Signed)
Office Visit Note  Patient: [name]             Date of Birth: [birth year]           MRN: [id number]             PCP: Mayra Neer, MD Referring: Mayra Neer, MD Visit Date: 12/06/2017 Occupation: @GUAROCC @    Subjective:  Left SI joint pain    History of Present Illness: [name] is a 52 y.o. female with history of psoriatic arthritis, DDD, and osteoarthritis.  Patient continues to inject  Methotrexate 1 ml once weekly and takes Otezela 30 mg BID.  Patient has tried Humira, Enbrel, and Simponi in the past.  She continues to have peeling and psoriasis on bilateral plantar aspect of feet.  She no longer has psoriasis on scalp.  Currently she is having discomfort in her left SI joint, bilateral great toes, and bilateral thumbs.  She states she has noticed some swelling in her great toes as well as her thumbs.  She states that overall she feels that that she has improved on Otezla and methotrexate combination.  She states she is stiff for about 30 minutes to 60 minutes every morning.  Activities of Daily Living:  Patient reports morning stiffness for 45 minutes.   Patient Reports nocturnal pain.  Difficulty dressing/grooming: Denies Difficulty climbing stairs: Denies Difficulty getting out of chair: Denies Difficulty using hands for taps, buttons, cutlery, and/or writing: Denies   Review of Systems  Constitutional: Negative for fatigue and fever.  HENT: Positive for ear ringing. Negative for ear pain.   Eyes: Negative for pain.  Respiratory: Positive for wheezing. Negative for cough and shortness of breath.   Cardiovascular: Positive for swelling in legs/feet.  Gastrointestinal: Positive for constipation and diarrhea.  Genitourinary: Negative for difficulty urinating.  Musculoskeletal: Positive for arthralgias, joint pain, joint swelling, myalgias, morning stiffness and myalgias.  Skin: Positive for rash. Negative for hair loss and sensitivity to sunlight.    Neurological: Negative for numbness and weakness.  Hematological: Negative for anemia and bruising/bleeding tendency.  Psychiatric/Behavioral: Positive for sleep disturbance.    PMFS History:  Patient Active Problem List   Diagnosis Date Noted  . Dysphagia 01/12/2017  . 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 Primary osteoarthritis of both knees 08/11/2016  . History of hypertension 08/11/2016  . History of ulcerative colitis 08/11/2016  . History of IBS 08/11/2016  . 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 High risk medications (not anticoagulants) long-term use 05/13/2016  . Knee pain, bilateral 05/13/2016  . Anserine bursitis 05/13/2016  . 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 Chondromalacia patellae, right knee 05/13/2016  . Psoriatic arthritis ([place]) 05/12/2016  . UNSPECIFIED HYPOTHYROIDISM 08/24/2009  . HYPERLIPIDEMIA 08/24/2009  . Essential hypertension 08/24/2009  . GERD 08/24/2009  . Ulcerative colitis ([place]) 08/24/2009  . IRRITABLE BOWEL SYNDROME 08/24/2009  . OVARIAN CYST 08/24/2009  . PSORIASIS 08/24/2009  . ARTHRITIS 08/24/2009  . TACHYCARDIA, HX OF 08/24/2009  . PANCREATITIS, HX OF 08/24/2009  . FIBROCYSTIC BREAST DISEASE, HX OF 08/24/2009    Past Medical History:  Diagnosis Date  . Allergy   . Anxiety   . Arthritis   . Asthma   . Cataract    both eyes  surgically repaired  . Depression    treated  . Dyspnea   . Fibrocystic breast disease   . GERD (gastroesophageal reflux disease)   . History of hiatal hernia  repaired  . History of kidney stones    questionable  . Hyperlipidemia   . Hypertension   . Hypothyroidism   . Irritable bowel syndrome   . Ovarian cyst   . Pancreatitis   . PONV (postoperative nausea and vomiting)    NO TROUBLE WITH CATARACT SURGERY  . Psoriasis   . Psoriatic arthritis ([place]) 05/12/2016   Poor response to Humira Inadequate response to Enbrel Inadequate response to Simponi  .  Tachycardia   . Ulcerative colitis     Family History  Problem Relation Age of Onset  . Breast cancer Mother 27  . Heart disease Mother   . Colon polyps Mother   . Other Mother        pre-cancerous tumor removed  . Colon cancer Mother   . Heart disease Father   . Crohn's disease Sister   . Alcoholism Sister   . Irritable bowel syndrome Unknown   . Crohn's disease Daughter   . Breast cancer Paternal Aunt   . Breast cancer Maternal Grandmother   . Esophageal cancer Neg Hx   . Rectal cancer Neg Hx   . Stomach cancer Neg Hx   . Pancreatic cancer Neg Hx    Past Surgical History:  Procedure Laterality Date  . ABDOMINAL HYSTERECTOMY    . ANKLE RECONSTRUCTION     left  . bladder tuck    . CATARACT EXTRACTION W/PHACO Right 09/11/2017   Procedure: CATARACT EXTRACTION PHACO AND INTRAOCULAR LENS PLACEMENT (IOC);  Surgeon: Birder Robson, MD;  Location: [facility] ORS;  Service: Ophthalmology;  Laterality: Right;  [place] 00:18.5 AP% 6.9 CDE 1.29 Fluid Pack Lot # [id number]  . CATARACT EXTRACTION W/PHACO Left 10/02/2017   Procedure: CATARACT EXTRACTION PHACO AND INTRAOCULAR LENS PLACEMENT ([place]);  Surgeon: Birder Robson, MD;  Location: [facility] ORS;  Service: Ophthalmology;  Laterality: Left;  [place] 00:25.9 AP% 6.9 CDE 1.80 Fluid Pack Lot # [id number]  . COLONOSCOPY    . EYE SURGERY     Lasik  . FRACTURE SURGERY    . KNEE SURGERY     left  . LAPAROSCOPIC NISSEN FUNDOPLICATION N/A 1/82/9937   Procedure: LAPAROSCOPIC NISSEN TAKEDOWN AND UPPER ENDOSCOPY;  Surgeon: Johnathan Hausen, MD;  Location: [facility];  Service: General;  Laterality: N/A;  . NASAL SEPTUM SURGERY    . NISSEN FUNDOPLICATION    . PARTIAL HYSTERECTOMY    . POLYPECTOMY    . repair cystocele and rectocele    . thyroid ablation    . TUBAL LIGATION    . UPPER GASTROINTESTINAL ENDOSCOPY    . UPPER GI ENDOSCOPY  01/12/2017   Procedure: UPPER GI ENDOSCOPY;  Surgeon: Johnathan Hausen, MD;  Location: [facility];  Service: General;;   Social  History   Social History Narrative  . Not on file     Objective: Vital Signs: BP 140/77 (BP Location: Left Arm, Patient Position: Sitting, Cuff Size: Normal)   Pulse 71   Ht 5' 6"  (1.676 m)   Wt 216 lb (98 kg)   BMI 34.86 kg/m    Physical Exam  Constitutional: She is oriented to person, place, and time. She appears well-developed and well-nourished.  HENT:  Head: Normocephalic and atraumatic.  Eyes: Conjunctivae and EOM are normal.  Neck: Normal range of motion.  Cardiovascular: Normal rate, regular rhythm, normal heart sounds and intact distal pulses.  Pulmonary/Chest: Effort normal and breath sounds normal.  Abdominal: Soft. Bowel sounds are normal.  Lymphadenopathy:    She has no cervical adenopathy.  Neurological: She is alert and oriented to person, place, and time.  Skin: Skin is warm and dry. Capillary refill takes less than 2 seconds.  Psoriasis on plantar aspects of bilateral feet.   Psychiatric: She has a normal mood and affect. Her behavior is normal.  Nursing note and vitals reviewed.    Musculoskeletal Exam: C-spine, thoracic spine, lumbar spine good ROM. Shoulder joints, elbow joints, wrist joints, MCPs, PIPs, and DIPs good ROM with no synovitis.  Tenderness of left 1st PIP joint.  Subluxation of left 1st PIP joint.  Hip joints, knee joints, ankle joints, MTPs, PIPs, and DIPs good ROM with no synovitis.  Tenderness of left 1st PIP and MTP joint.  No warmth or effusion of knee joint or ankle joints.  Ankle joint tenderness bilaterally.  Left SI joint tenderness.  No achilles tendonitis or plantar fasciitis.   CDAI Exam: CDAI Homunculus Exam:   Tenderness:  Right hand: 1st MCP and 1st PIP Left hand: 1st MCP and 1st PIP Right foot: 1st MTP Left foot: 1st MTP  Joint Counts:  CDAI Tender Joint count: 4 CDAI Swollen Joint count: 0  Global Assessments:  Patient Global Assessment: 4 Provider Global Assessment: 4  CDAI Calculated Score:  12    Investigation: No additional findings. CBC Latest Ref Rng & Units 06/20/2017 03/01/2017 01/13/2017  WBC 3.8 - 10.8 Thousand/uL 7.5 6.2 11.2(H)  Hemoglobin 11.7 - 15.5 g/dL 13.2 13.7 11.1(L)  Hematocrit 35.0 - 45.0 % 39.0 40.8 33.9(L)  Platelets 140 - 400 Thousand/uL 281 251.0 253   CMP Latest Ref Rng & Units 06/20/2017 03/01/2017 01/13/2017  Glucose 65 - 99 mg/dL 98 98 112(H)  BUN 7 - 25 mg/dL 11 14 10   Creatinine 0.50 - 1.05 mg/dL 0.81 0.77 0.82  Sodium 135 - 146 mmol/L 138 137 139  Potassium 3.5 - 5.3 mmol/L 4.4 4.1 4.0  Chloride 98 - 110 mmol/L 101 101 102  CO2 20 - 32 mmol/L 31 29 30   Calcium 8.6 - 10.4 mg/dL 9.4 9.1 8.7(L)  Total Protein 6.1 - 8.1 g/dL 7.1 6.9 -  Total Bilirubin 0.2 - 1.2 mg/dL 0.5 0.4 -  Alkaline Phos 39 - 117 U/L - 57 -  AST 10 - 35 U/L 20 16 -  ALT 6 - 29 U/L 20 14 -    Imaging: No results found.  Speciality Comments: No specialty comments available.    Procedures:  Sacroiliac Joint Inj on 12/06/2017 9:39 AM Indications: pain Details: 27 G 1.5 in needle, posterior approach Medications: 1 mL lidocaine 1 %; 40 mg triamcinolone acetonide 40 MG/ML Aspirate: 0 mL Outcome: tolerated well, no immediate complications Procedure, treatment alternatives, risks and benefits explained, specific risks discussed. Consent was given by the patient. Immediately prior to procedure a time out was called to verify the correct patient, procedure, equipment, support staff and site/side marked as required. Patient was prepped and draped in the usual sterile fashion.     Allergies: Imuran [azathioprine]; Meperidine hcl; Remicade [infliximab]; Sulfa antibiotics; Sulfa drugs cross reactors; Sulfasalazine; Avelox [moxifloxacin]; Doxycycline; Erythromycin; and Latex   Assessment / Plan:     Visit Diagnoses: Psoriatic arthritis ([place]) -She has no active synovitis or dactylitis on exam.  She has tenderness of left hand first PIP joint and left 1st PIP and MTP in her foot.   She also has left SI joint tenderness.  No plantar fasciitis or Achilles tendinitis.  She continues to have some peeling and psoriasis on the plantar aspects of bilateral feet.  Her psoriasis in her scalp has resolved.  She continues to inject methotrexate 1 mL once weekly and takes folic acid 2 mg daily, and takes Otezla 30 mg twice daily.  Due to increased joint pain and stiffness we discussed switching her from [place] to [place].  She was given information on Morrie Sheldon to read about at home.  We discussed indications, contraindications, and side effects.  She would not like to make the switch at this time.  A left SI joint injection was performed today in the office.  Potential side effects were discussed.  She tolerated the procedure well.  We also obtained x-rays of her hands and feet due to the increased joint pain.  Plan: XR Foot 2 Views Right, XR Foot 2 Views Left, XR Hand 2 View Right, XR Hand 2 View Left  Psoriasis: She has skin peeling and psoriasis on the plantar aspect of bilateral feet.  High risk medication use - Otezla, MTX.  CBC and CMP will be drawn today to monitor for drug toxicity.  She will return in August and every 3 months for CBC and CMP.  Pain in both hands -she is been having increased tenderness and joint pain in her bilateral hands and feet.  She has tenderness specifically in her left PIP joint in her hand.  X-rays were obtained today.  Plan: XR Hand 2 View Right, XR Hand 2 View Left.  The x-ray showed no erosive changes.  She had only some mild osteoarthritic changes.  Pain in both feet -She is been having increased discomfort in her feet especially in her left first PIP and MTP joint.  X-rays of her feet were obtained today.  Plan: XR Foot 2 Views Right, XR Foot 2 Views Left.  The x-ray showed mild osteoarthritic changes.  Chronic left SI joint pain: She has tenderness in left SI joint today.  She requested a cortisone injection today in the office.  She tolerated the  procedure well.  Positive TB test  DDD (degenerative disc disease), lumbar: No midline spinal tenderness.    Primary osteoarthritis of both knees: No warmth or effusion on exam.  She has no discomfort in her knees at this time.  She occasionally experiences discomfort when climbing steps.   Spondylosis of lumbar region without myelopathy or radiculopathy: No midline spinal tenderness.    History of ulcerative colitis: We dicussed the indication, contraindications, and side effects of Xeljanz.  We advised her to discuss Morrie Sheldon with GI.   Other medical conditions are listed as follows:  History of IBS  History of hypertension  History of gastroesophageal reflux (GERD)     Orders: Orders Placed This Encounter  Procedures  . Sacroiliac Joint Inj  . XR Foot 2 Views Right  . XR Foot 2 Views Left  . XR Hand 2 View Right  . XR Hand 2 View Left  . CBC with Differential/Platelet  . COMPLETE METABOLIC PANEL WITH GFR   No orders of the defined types were placed in this encounter.   Face-to-face time spent with patient was 30 minutes. >50% of time was spent in counseling and coordination of care.  Follow-Up Instructions: Return in about 5 months (around 05/08/2018) for Psoriatic arthritis, Osteoarthritis.   Ofilia Neas, PA-C   I examined and evaluated the patient with Hazel Sams PA.  Patient is still has active disease with pain and swelling in some of the joints on my exam as described above.  She also had left SI joint tenderness which  was injected with cortisone as described above.  We discussed possible use of Morrie Sheldon which will be helpful for psoriatic arthritis and also ulcerative colitis.  At this point she would like to read the handout.  The plan of care was discussed as noted above.  Bo Merino, MD  Note - This record has been created using Editor, commissioning.  Chart creation errors have been sought, but may not always  have been located. Such creation errors do  not reflect on  the standard of medical care.

## 2017-12-02 ENCOUNTER — Other Ambulatory Visit: Payer: Self-pay | Admitting: Gastroenterology

## 2017-12-06 ENCOUNTER — Ambulatory Visit: Payer: Medicare HMO | Admitting: Rheumatology

## 2017-12-06 ENCOUNTER — Ambulatory Visit (INDEPENDENT_AMBULATORY_CARE_PROVIDER_SITE_OTHER): Payer: Self-pay

## 2017-12-06 ENCOUNTER — Encounter: Payer: Self-pay | Admitting: Rheumatology

## 2017-12-06 VITALS — BP 140/77 | HR 71 | Ht 66.0 in | Wt 216.0 lb

## 2017-12-06 DIAGNOSIS — L4050 Arthropathic psoriasis, unspecified: Secondary | ICD-10-CM

## 2017-12-06 DIAGNOSIS — Z8719 Personal history of other diseases of the digestive system: Secondary | ICD-10-CM

## 2017-12-06 DIAGNOSIS — M47816 Spondylosis without myelopathy or radiculopathy, lumbar region: Secondary | ICD-10-CM | POA: Diagnosis not present

## 2017-12-06 DIAGNOSIS — M5136 Other intervertebral disc degeneration, lumbar region: Secondary | ICD-10-CM

## 2017-12-06 DIAGNOSIS — M79641 Pain in right hand: Secondary | ICD-10-CM | POA: Diagnosis not present

## 2017-12-06 DIAGNOSIS — M170 Bilateral primary osteoarthritis of knee: Secondary | ICD-10-CM

## 2017-12-06 DIAGNOSIS — M79642 Pain in left hand: Secondary | ICD-10-CM | POA: Diagnosis not present

## 2017-12-06 DIAGNOSIS — M79672 Pain in left foot: Secondary | ICD-10-CM | POA: Diagnosis not present

## 2017-12-06 DIAGNOSIS — Z79899 Other long term (current) drug therapy: Secondary | ICD-10-CM

## 2017-12-06 DIAGNOSIS — Z8679 Personal history of other diseases of the circulatory system: Secondary | ICD-10-CM

## 2017-12-06 DIAGNOSIS — L409 Psoriasis, unspecified: Secondary | ICD-10-CM | POA: Diagnosis not present

## 2017-12-06 DIAGNOSIS — M79671 Pain in right foot: Secondary | ICD-10-CM

## 2017-12-06 DIAGNOSIS — M533 Sacrococcygeal disorders, not elsewhere classified: Secondary | ICD-10-CM | POA: Diagnosis not present

## 2017-12-06 DIAGNOSIS — R7611 Nonspecific reaction to tuberculin skin test without active tuberculosis: Secondary | ICD-10-CM | POA: Diagnosis not present

## 2017-12-06 DIAGNOSIS — G8929 Other chronic pain: Secondary | ICD-10-CM

## 2017-12-06 MED ORDER — LIDOCAINE HCL 1 % IJ SOLN
1.0000 mL | INTRAMUSCULAR | Status: AC | PRN
  Administered 2017-12-06: 1 mL

## 2017-12-06 MED ORDER — TRIAMCINOLONE ACETONIDE 40 MG/ML IJ SUSP
40.0000 mg | INTRAMUSCULAR | Status: AC | PRN
  Administered 2017-12-06: 40 mg via INTRA_ARTICULAR

## 2017-12-06 NOTE — Patient Instructions (Signed)
Tofacitinib tablets What is this medicine? TOFACITINIB (TOE fa SYE ti nib) is a medicine that works on the immune system. This medicine is used to treat rheumatoid arthritis and psoriatic arthritis. This medicine may be used for other purposes; ask your health care provider or pharmacist if you have questions. COMMON BRAND NAME(S): Morrie Sheldon What should I tell my health care provider before I take this medicine? They need to know if you have any of these conditions: -cancer -diabetes -high cholesterol -immune system problems -infection (especially a virus infection such as chickenpox, cold sores, or herpes) -kidney disease -liver disease -low blood counts, like low white cell, platelet, or red cell counts -stomach or intestine problems -an unusual or allergic reaction to tofacitinib, other medicines, foods, dyes, or preservatives -pregnant or trying to get pregnant -breast-feeding How should I use this medicine? Take this medicine by mouth with a glass of water. Follow the directions on the prescription label. You can take it with or without food. If it upsets your stomach, take it with food. A special MedGuide will be given to you by the pharmacist with each prescription and refill. Be sure to read this information carefully each time. Talk to your pediatrician regarding the use of this medicine in children. Special care may be needed. Overdosage: If you think you have taken too much of this medicine contact a poison control center or emergency room at once. NOTE: This medicine is only for you. Do not share this medicine with others. What if I miss a dose? If you miss a dose, take it as soon as you can. If it is almost time for your next dose, take only that dose. Do not take double or extra doses. What may interact with this medicine? Do not take this medicine with any of the following medications: -azathioprine, cyclosporine, or other immunosuppressive drugs -biologic medicines for  arthritis such as abatacept, adalimumab, anakinra, certolizumab, etanercept, golimumab, infliximab, rituximab, secukinumab, tocilizumab, ustekinumab -live vaccines This medicine may also interact with the following medications: -antiviral medicines for hepatitis, HIV or AIDS -certain medicines for fungal infections like fluconazole, itraconazole, ketoconazole, voriconazole -certain medicines for seizures like carbamazepine, phenobarbital, phenytoin -rifampin -supplements, such as St. John's wort This list may not describe all possible interactions. Give your health care provider a list of all the medicines, herbs, non-prescription drugs, or dietary supplements you use. Also tell them if you smoke, drink alcohol, or use illegal drugs. Some items may interact with your medicine. What should I watch for while using this medicine? Tell your doctor or healthcare professional if your symptoms do not start to get better or if they get worse. Avoid taking products that contain aspirin, acetaminophen, ibuprofen, naproxen, or ketoprofen unless instructed by your doctor. These medicines may hide a fever. Call your doctor or health care professional for advice if you get a fever, chills or sore throat, or other symptoms of a cold or flu. Do not treat yourself. This drug decreases your body's ability to fight infections. Try to avoid being around people who are sick. What side effects may I notice from receiving this medicine? Side effects that you should report to your doctor or health care professional as soon as possible: -allergic reactions like skin rash, itching or hives, swelling of the face, lips, or tongue -breathing problems -dizziness -signs of infection - fever or chills, cough, sore throat, pain or trouble passing urine -signs and symptoms of liver injury like dark yellow or brown urine; general ill feeling or flu-like  symptoms; light-colored stools; loss of appetite; nausea; right upper belly  pain; unusually weak or tired; yellowing of the eyes or skin -stomach pain or a sudden change in bowel habits -unusually weak or tired Side effects that usually do not require medical attention (report to your doctor or health care professional if they continue or are bothersome): -diarrhea -headache -muscle aches -runny nose -sinus trouble This list may not describe all possible side effects. Call your doctor for medical advice about side effects. You may report side effects to FDA at 1-800-FDA-1088. Where should I keep my medicine? Keep out of the reach of children. Store between 20 and 25 degrees C (68 and 77 degrees F). Throw away any unused medicine after the expiration date. NOTE: This sheet is a summary. It may not cover all possible information. If you have questions about this medicine, talk to your doctor, pharmacist, or health care provider.  2018 Elsevier/Gold Standard (2016-07-21 11:43:53)

## 2017-12-07 LAB — CBC WITH DIFFERENTIAL/PLATELET
Basophils Absolute: 57 cells/uL (ref 0–200)
Basophils Relative: 0.7 %
Eosinophils Absolute: 139 cells/uL (ref 15–500)
Eosinophils Relative: 1.7 %
HCT: 39.6 % (ref 35.0–45.0)
Hemoglobin: 13.3 g/dL (ref 11.7–15.5)
Lymphs Abs: 2353 cells/uL (ref 850–3900)
MCH: 30.8 pg (ref 27.0–33.0)
MCHC: 33.6 g/dL (ref 32.0–36.0)
MCV: 91.7 fL (ref 80.0–100.0)
MPV: 10.5 fL (ref 7.5–12.5)
Monocytes Relative: 6.2 %
Neutro Abs: 5141 cells/uL (ref 1500–7800)
Neutrophils Relative %: 62.7 %
Platelets: 287 10*3/uL (ref 140–400)
RBC: 4.32 10*6/uL (ref 3.80–5.10)
RDW: 12.9 % (ref 11.0–15.0)
Total Lymphocyte: 28.7 %
WBC mixed population: 508 cells/uL (ref 200–950)
WBC: 8.2 10*3/uL (ref 3.8–10.8)

## 2017-12-07 LAB — COMPLETE METABOLIC PANEL WITH GFR
AG Ratio: 1.4 (calc) (ref 1.0–2.5)
ALT: 19 U/L (ref 6–29)
AST: 22 U/L (ref 10–35)
Albumin: 4.3 g/dL (ref 3.6–5.1)
Alkaline phosphatase (APISO): 73 U/L (ref 33–130)
BUN: 11 mg/dL (ref 7–25)
CO2: 31 mmol/L (ref 20–32)
Calcium: 10 mg/dL (ref 8.6–10.4)
Chloride: 102 mmol/L (ref 98–110)
Creat: 0.77 mg/dL (ref 0.50–1.05)
GFR, Est African American: 103 mL/min/{1.73_m2} (ref >=60)
GFR, Est Non African American: 89 mL/min/{1.73_m2} (ref >=60)
Globulin: 3.1 g/dL (calc) (ref 1.9–3.7)
Glucose, Bld: 95 mg/dL (ref 65–99)
Potassium: 4.8 mmol/L (ref 3.5–5.3)
Sodium: 140 mmol/L (ref 135–146)
Total Bilirubin: 0.5 mg/dL (ref 0.2–1.2)
Total Protein: 7.4 g/dL (ref 6.1–8.1)

## 2017-12-07 NOTE — Progress Notes (Signed)
Labs are WNL.

## 2017-12-07 NOTE — Progress Notes (Signed)
Left message labs were within normal range

## 2017-12-28 DIAGNOSIS — R030 Elevated blood-pressure reading, without diagnosis of hypertension: Secondary | ICD-10-CM | POA: Diagnosis not present

## 2017-12-28 DIAGNOSIS — J029 Acute pharyngitis, unspecified: Secondary | ICD-10-CM | POA: Diagnosis not present

## 2017-12-28 DIAGNOSIS — H66001 Acute suppurative otitis media without spontaneous rupture of ear drum, right ear: Secondary | ICD-10-CM | POA: Diagnosis not present

## 2018-01-04 ENCOUNTER — Other Ambulatory Visit: Payer: Self-pay | Admitting: Rheumatology

## 2018-01-04 NOTE — Telephone Encounter (Signed)
Last Visit: 12/06/17 Next visit: 05/07/18 Labs: 12/06/17 WNL  Okay to refill per Dr. Estanislado Pandy

## 2018-01-07 DIAGNOSIS — Z0000 Encounter for general adult medical examination without abnormal findings: Secondary | ICD-10-CM | POA: Diagnosis not present

## 2018-01-07 DIAGNOSIS — Z6832 Body mass index (BMI) 32.0-32.9, adult: Secondary | ICD-10-CM | POA: Diagnosis not present

## 2018-01-07 DIAGNOSIS — I10 Essential (primary) hypertension: Secondary | ICD-10-CM | POA: Diagnosis not present

## 2018-01-07 DIAGNOSIS — N289 Disorder of kidney and ureter, unspecified: Secondary | ICD-10-CM | POA: Diagnosis not present

## 2018-01-07 DIAGNOSIS — E039 Hypothyroidism, unspecified: Secondary | ICD-10-CM | POA: Diagnosis not present

## 2018-01-07 DIAGNOSIS — R000 Tachycardia, unspecified: Secondary | ICD-10-CM | POA: Diagnosis not present

## 2018-01-07 DIAGNOSIS — K219 Gastro-esophageal reflux disease without esophagitis: Secondary | ICD-10-CM | POA: Diagnosis not present

## 2018-01-07 DIAGNOSIS — K859 Acute pancreatitis without necrosis or infection, unspecified: Secondary | ICD-10-CM | POA: Diagnosis not present

## 2018-01-07 DIAGNOSIS — E7800 Pure hypercholesterolemia, unspecified: Secondary | ICD-10-CM | POA: Diagnosis not present

## 2018-01-14 ENCOUNTER — Other Ambulatory Visit: Payer: Medicare HMO

## 2018-01-14 ENCOUNTER — Telehealth: Payer: Self-pay | Admitting: Gastroenterology

## 2018-01-14 DIAGNOSIS — R197 Diarrhea, unspecified: Secondary | ICD-10-CM

## 2018-01-14 NOTE — Telephone Encounter (Signed)
Doc of Day Patient with UC calls today with c/o upper abdominal bloating and tenesmus. She is able to produce mucous with blood and some stool. Eating nauseates her and she is not very hungry. Symptoms started Saturday 01/12/18. She was on antibiotics about 2 weeks ago. Please advise.

## 2018-01-14 NOTE — Addendum Note (Signed)
Addended by: Isaiah Serge D on: 01/14/2018 01:44 PM   Modules accepted: Orders

## 2018-01-14 NOTE — Telephone Encounter (Signed)
BRAT diet Lots of liquids/rehydration  Stool for C diff PCR

## 2018-01-14 NOTE — Telephone Encounter (Signed)
Patient is instructed. Lab entered into Epic.

## 2018-01-17 LAB — CLOSTRIDIUM DIFFICILE BY PCR: Toxigenic C. Difficile by PCR: NEGATIVE

## 2018-01-18 DIAGNOSIS — K5190 Ulcerative colitis, unspecified, without complications: Secondary | ICD-10-CM | POA: Diagnosis not present

## 2018-01-18 NOTE — Progress Notes (Signed)
She does not have C diff.  If she is not better please ask Dr. Silverio Decamp or DOD for advice as I am "off" today

## 2018-01-20 ENCOUNTER — Emergency Department (HOSPITAL_COMMUNITY): Payer: Medicare HMO

## 2018-01-20 ENCOUNTER — Emergency Department (HOSPITAL_COMMUNITY)
Admission: EM | Admit: 2018-01-20 | Discharge: 2018-01-20 | Disposition: A | Discharge status: Home / Self Care | Payer: Medicare HMO | Attending: Emergency Medicine | Admitting: Emergency Medicine

## 2018-01-20 ENCOUNTER — Encounter (HOSPITAL_COMMUNITY): Payer: Self-pay | Admitting: Emergency Medicine

## 2018-01-20 DIAGNOSIS — K5190 Ulcerative colitis, unspecified, without complications: Secondary | ICD-10-CM | POA: Diagnosis not present

## 2018-01-20 DIAGNOSIS — J45909 Unspecified asthma, uncomplicated: Secondary | ICD-10-CM | POA: Diagnosis not present

## 2018-01-20 DIAGNOSIS — Z79899 Other long term (current) drug therapy: Secondary | ICD-10-CM | POA: Insufficient documentation

## 2018-01-20 DIAGNOSIS — R1084 Generalized abdominal pain: Secondary | ICD-10-CM | POA: Diagnosis present

## 2018-01-20 DIAGNOSIS — Z87891 Personal history of nicotine dependence: Secondary | ICD-10-CM | POA: Insufficient documentation

## 2018-01-20 DIAGNOSIS — K51918 Ulcerative colitis, unspecified with other complication: Secondary | ICD-10-CM | POA: Diagnosis not present

## 2018-01-20 DIAGNOSIS — K51919 Ulcerative colitis, unspecified with unspecified complications: Secondary | ICD-10-CM | POA: Diagnosis not present

## 2018-01-20 DIAGNOSIS — E039 Hypothyroidism, unspecified: Secondary | ICD-10-CM | POA: Insufficient documentation

## 2018-01-20 DIAGNOSIS — Z91040 Latex allergy status: Secondary | ICD-10-CM | POA: Insufficient documentation

## 2018-01-20 DIAGNOSIS — R1030 Lower abdominal pain, unspecified: Secondary | ICD-10-CM

## 2018-01-20 DIAGNOSIS — I10 Essential (primary) hypertension: Secondary | ICD-10-CM | POA: Diagnosis not present

## 2018-01-20 LAB — CBC
HCT: 41.7 % (ref 36.0–46.0)
Hemoglobin: 13.7 g/dL (ref 12.0–15.0)
MCH: 31.8 pg (ref 26.0–34.0)
MCHC: 32.9 g/dL (ref 30.0–36.0)
MCV: 96.8 fL (ref 78.0–100.0)
Platelets: 355 10*3/uL (ref 150–400)
RBC: 4.31 MIL/uL (ref 3.87–5.11)
RDW: 13.1 % (ref 11.5–15.5)
WBC: 12.7 10*3/uL — ABNORMAL HIGH (ref 4.0–10.5)

## 2018-01-20 LAB — URINALYSIS, ROUTINE W REFLEX MICROSCOPIC
Bilirubin Urine: NEGATIVE
Glucose, UA: NEGATIVE mg/dL
Hgb urine dipstick: NEGATIVE
Ketones, ur: NEGATIVE mg/dL
Leukocytes, UA: NEGATIVE
Nitrite: NEGATIVE
Protein, ur: NEGATIVE mg/dL
Specific Gravity, Urine: 1.003 — ABNORMAL LOW (ref 1.005–1.030)
pH: 7 (ref 5.0–8.0)

## 2018-01-20 LAB — COMPREHENSIVE METABOLIC PANEL WITH GFR
ALT: 21 U/L (ref 0–44)
AST: 24 U/L (ref 15–41)
Albumin: 3.8 g/dL (ref 3.5–5.0)
Alkaline Phosphatase: 68 U/L (ref 38–126)
Anion gap: 10 (ref 5–15)
BUN: 16 mg/dL (ref 6–20)
CO2: 25 mmol/L (ref 22–32)
Calcium: 9.1 mg/dL (ref 8.9–10.3)
Chloride: 107 mmol/L (ref 98–111)
Creatinine, Ser: 0.84 mg/dL (ref 0.44–1.00)
GFR calc Af Amer: >60 mL/min
GFR calc non Af Amer: >60 mL/min
Glucose, Bld: 113 mg/dL — ABNORMAL HIGH (ref 70–99)
Potassium: 4.4 mmol/L (ref 3.5–5.1)
Sodium: 142 mmol/L (ref 135–145)
Total Bilirubin: 0.4 mg/dL (ref 0.3–1.2)
Total Protein: 8 g/dL (ref 6.5–8.1)

## 2018-01-20 LAB — LIPASE, BLOOD: Lipase: 42 U/L (ref 11–51)

## 2018-01-20 LAB — I-STAT BETA HCG BLOOD, ED (MC, WL, AP ONLY): I-stat hCG, quantitative: <5 m[IU]/mL (ref <5)

## 2018-01-20 MED ORDER — IOPAMIDOL (ISOVUE-300) INJECTION 61%
100.0000 mL | Freq: Once | INTRAVENOUS | Status: AC | PRN
  Administered 2018-01-20: 100 mL via INTRAVENOUS

## 2018-01-20 MED ORDER — OXYCODONE-ACETAMINOPHEN 5-325 MG PO TABS: 1.0000 | ORAL_TABLET | Freq: Four times a day (QID) | ORAL | 0 refills | Status: DC | PRN

## 2018-01-20 MED ORDER — DICYCLOMINE HCL 10 MG/ML IM SOLN
20.0000 mg | Freq: Once | INTRAMUSCULAR | Status: AC
  Administered 2018-01-20: 20 mg via INTRAMUSCULAR
  Filled 2018-01-20: qty 2

## 2018-01-20 MED ORDER — IOPAMIDOL (ISOVUE-300) INJECTION 61%
INTRAVENOUS | Status: AC
  Filled 2018-01-20: qty 100

## 2018-01-20 MED ORDER — KETOROLAC TROMETHAMINE 30 MG/ML IJ SOLN
15.0000 mg | Freq: Once | INTRAMUSCULAR | Status: AC
  Administered 2018-01-20: 15 mg via INTRAVENOUS
  Filled 2018-01-20: qty 1

## 2018-01-20 MED ORDER — METHYLPREDNISOLONE SODIUM SUCC 125 MG IJ SOLR
60.0000 mg | Freq: Once | INTRAMUSCULAR | Status: AC
  Administered 2018-01-20: 60 mg via INTRAVENOUS
  Filled 2018-01-20: qty 2

## 2018-01-20 MED ORDER — MORPHINE SULFATE (PF) 4 MG/ML IV SOLN
4.0000 mg | Freq: Once | INTRAVENOUS | Status: DC
  Filled 2018-01-20: qty 1

## 2018-01-20 MED ORDER — DICYCLOMINE HCL 20 MG PO TABS: 20.0000 mg | ORAL_TABLET | Freq: Two times a day (BID) | ORAL | 0 refills | Status: DC

## 2018-01-20 MED ORDER — SODIUM CHLORIDE 0.9 % IV BOLUS
1000.0000 mL | Freq: Once | INTRAVENOUS | Status: AC
  Administered 2018-01-20: 1000 mL via INTRAVENOUS

## 2018-01-20 MED ORDER — ONDANSETRON HCL 4 MG/2ML IJ SOLN
4.0000 mg | Freq: Once | INTRAMUSCULAR | Status: AC
  Administered 2018-01-20: 4 mg via INTRAVENOUS
  Filled 2018-01-20: qty 2

## 2018-01-20 NOTE — ED Triage Notes (Signed)
Pt c/o a IBS/ulcerative colitis flare up. Pt states that she was at her GI doc and provided a stool sample on Monday, and that she went to her PCP on Friday, where she was given prednisone. Pt was told by PCP to return to ED if she was still not feeling well. Pt c/o mainly RUQ pain. Pt states that she has noticed mostly pus in her BMs.

## 2018-01-20 NOTE — Discharge Instructions (Addendum)
Please continue with steroid taper as prescribed by your primary doctor.  Please begin taking Bentyl twice daily and Percocet as needed for breakthrough pain.  If you do not hear from Dr. Silverio Decamp please call to follow-up.  Return if you have worsening abdominal pain, fevers, persistent vomiting, or unable to pass a bowel movement, begin passing large amounts of blood and feel lightheaded or fatigued or any other new or concerning symptoms.

## 2018-01-20 NOTE — ED Provider Notes (Signed)
[facility] DEPT Provider Note   CSN: [id number] Arrival date & time: 01/20/18  1236     History   Chief Complaint Chief Complaint  Patient presents with  . Abdominal Pain    HPI [name] is a 52 y.o. female.  LOUANA Chavez is a 52 y.o. Female with a history of ulcerative colitis, IBS-D, hypertension, hyperlipidemia, asthma, who presents to the emergency department for evaluation of possible ulcerative colitis flareup.  Patient reports she has been having increasing abdominal pain and bowel movements since 01/12/2018.  She reports she typically has 3-4 bowel movements a day that consist primarily of blood and pus with some mucus, she reports minimal stool content which is frequently a light tan color.  Patient denies associated fevers, nausea or vomiting, she has had decreased appetite.  She denies urinary symptoms.  No chest pain or shortness of breath.  Patient contacted her GI when she recently been on a course of antibiotics, they ordered outpatient stool testing for C. difficile which was negative, they were unable to get her into see her, but patient was seen by her PCP who started patient on steroid taper, patient is on her fourth day of 60 mg of prednisone, after which she is to switch to 40 mg for 4 additional days.  Patient reports despite steroid she reports no improvement in her symptoms and that her generalized abdominal pain has been increasing.  She expresses concern that she feels bloated and like she needs to have a bowel movement but only seems to be passing blood and pus.  Aside from steroids and her chronic ulcerative colitis medic else to treat her symptoms.  Patient is followed by Dr. Silverio Decamp with [facility], had colonoscopy in May which showed no active inflammation or other acute findings at that time     Past Medical History:  Diagnosis Date  . Allergy   . Anxiety   . Arthritis   . Asthma   . Cataract    both eyes   surgically repaired  . Depression    treated  . Dyspnea   . Fibrocystic breast disease   . GERD (gastroesophageal reflux disease)   . History of hiatal hernia    repaired  . History of kidney stones    questionable  . Hyperlipidemia   . Hypertension   . Hypothyroidism   . Irritable bowel syndrome   . Ovarian cyst   . Pancreatitis   . PONV (postoperative nausea and vomiting)    NO TROUBLE WITH CATARACT SURGERY  . Psoriasis   . Psoriatic arthritis ([place]) 05/12/2016   Poor response to Humira Inadequate response to Enbrel Inadequate response to Simponi  . Tachycardia   . Ulcerative colitis     Patient Active Problem List   Diagnosis Date Noted  . Dysphagia 01/12/2017  . 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 Primary osteoarthritis of both knees 08/11/2016  . History of hypertension 08/11/2016  . History of ulcerative colitis 08/11/2016  . History of IBS 08/11/2016  . 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 High risk medications (not anticoagulants) long-term use 05/13/2016  . Knee pain, bilateral 05/13/2016  . Anserine bursitis 05/13/2016  . 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 Chondromalacia patellae, right knee 05/13/2016  . Psoriatic arthritis ([place]) 05/12/2016  . UNSPECIFIED HYPOTHYROIDISM 08/24/2009  . HYPERLIPIDEMIA 08/24/2009  . Essential hypertension 08/24/2009  . GERD 08/24/2009  . Ulcerative colitis ([place]) 08/24/2009  . IRRITABLE BOWEL  SYNDROME 08/24/2009  . OVARIAN CYST 08/24/2009  . PSORIASIS 08/24/2009  . ARTHRITIS 08/24/2009  . TACHYCARDIA, HX OF 08/24/2009  . PANCREATITIS, HX OF 08/24/2009  . FIBROCYSTIC BREAST DISEASE, HX OF 08/24/2009    Past Surgical History:  Procedure Laterality Date  . ABDOMINAL HYSTERECTOMY    . ANKLE RECONSTRUCTION     left  . bladder tuck    . CATARACT EXTRACTION W/PHACO Right 09/11/2017   Procedure: CATARACT EXTRACTION PHACO AND INTRAOCULAR LENS PLACEMENT (IOC);  Surgeon:  Birder Robson, MD;  Location: [facility] ORS;  Service: Ophthalmology;  Laterality: Right;  [place] 00:18.5 AP% 6.9 CDE 1.29 Fluid Pack Lot # [id number]  . CATARACT EXTRACTION W/PHACO Left 10/02/2017   Procedure: CATARACT EXTRACTION PHACO AND INTRAOCULAR LENS PLACEMENT ([place]);  Surgeon: Birder Robson, MD;  Location: [facility] ORS;  Service: Ophthalmology;  Laterality: Left;  [place] 00:25.9 AP% 6.9 CDE 1.80 Fluid Pack Lot # [id number]  . COLONOSCOPY    . EYE SURGERY     Lasik  . FRACTURE SURGERY    . KNEE SURGERY     left  . LAPAROSCOPIC NISSEN FUNDOPLICATION N/A 1/61/0960   Procedure: LAPAROSCOPIC NISSEN TAKEDOWN AND UPPER ENDOSCOPY;  Surgeon: Johnathan Hausen, MD;  Location: [facility];  Service: General;  Laterality: N/A;  . NASAL SEPTUM SURGERY    . NISSEN FUNDOPLICATION    . PARTIAL HYSTERECTOMY    . POLYPECTOMY    . repair cystocele and rectocele    . thyroid ablation    . TUBAL LIGATION    . UPPER GASTROINTESTINAL ENDOSCOPY    . UPPER GI ENDOSCOPY  01/12/2017   Procedure: UPPER GI ENDOSCOPY;  Surgeon: Johnathan Hausen, MD;  Location: [facility];  Service: General;;     OB History   None      Home Medications    Prior to Admission medications   Medication Sig Start Date End Date Taking? Authorizing Provider  aspirin-acetaminophen-caffeine (EXCEDRIN MIGRAINE) [phone] MG tablet Take 2 tablets by mouth every 8 (eight) hours as needed for headache.    Yes [provider]  azelastine (OPTIVAR) 0.05 % ophthalmic solution Place 1 drop into both eyes 2 (two) times daily as needed (for allergies).  06/06/17  Yes [provider]  budesonide-formoterol (SYMBICORT) 160-4.5 MCG/ACT inhaler Inhale 2 puffs into the lungs 2 (two) times daily.   Yes [provider]  Calcium Carbonate-Vit D-Min (CALTRATE 600+D PLUS PO) Take 1 tablet by mouth daily.   Yes [provider]  clobetasol ointment (TEMOVATE) 9.14 % Apply 1 application topically 2 (two) times daily. Use for 4-7 days  and wait 1 week before reusing Patient taking differently: Apply 1 application topically 2 (two) times daily as needed (for psoriasis). Use for 4-7 days and wait 1 week before reusing 01/09/17  Yes Panwala, Naitik, PA-C  clonazePAM (KLONOPIN) 1 MG tablet Take 0.5-1 mg by mouth at bedtime as needed (for sleep).    Yes [provider]  estradiol (ESTRACE) 1 MG tablet Take 1 mg by mouth daily.   Yes [provider]  fluticasone (FLONASE) 50 MCG/ACT nasal spray Place 1 spray into both nostrils daily.    Yes [provider]  folic acid (FOLVITE) 1 MG tablet Take 2 mg by mouth daily.   Yes [provider]  ibuprofen (ADVIL,MOTRIN) 200 MG tablet Take 400 mg by mouth every 6 (six) hours as needed for mild pain.    Yes [provider]  levocetirizine (XYZAL) 5 MG tablet Take 5 mg by mouth  every evening.   Yes [provider]  levothyroxine (SYNTHROID, LEVOTHROID) 125 MCG tablet Take 125 mcg by mouth every other day.    Yes [provider]  levothyroxine (SYNTHROID, LEVOTHROID) 137 MCG tablet Take 137 mcg by mouth every other day.   Yes [provider]  mesalamine (LIALDA) 1.2 g EC tablet Take 1.2 g by mouth 2 (two) times daily.   Yes [provider]  methotrexate 50 MG/2ML injection INJECT 1 ML SUBCUTANEOUSLY  WEEKLY 01/04/18  Yes Deveshwar, Abel Presto, MD  montelukast (SINGULAIR) 10 MG tablet Take 10 mg by mouth at bedtime.   Yes [provider]  Multiple Vitamins-Minerals (CENTRUM SILVER ULTRA WOMENS PO) Take 1 tablet by mouth daily.   Yes [provider]  omeprazole (PRILOSEC) 20 MG capsule TAKE 1 CAPSULE EVERY DAY BEFORE BREAKFAST 12/03/17  Yes Nandigam, Venia Minks, MD  ondansetron (ZOFRAN) 4 MG tablet Take 1 tablet (4 mg total) by mouth every 8 (eight) hours as needed for nausea or vomiting. 03/01/17  Yes Nandigam, Kavitha V, MD  OTEZLA 30 MG TABS TAKE 1 TABLET BY MOUTH TWICE DAILY 11/13/17  Yes Deveshwar, Abel Presto, MD   predniSONE (DELTASONE) 20 MG tablet Take 38m for 3 days and 40 mg for 2 days 01/18/18  Yes [provider]  rosuvastatin (CRESTOR) 20 MG tablet Take 20 mg by mouth daily.   Yes [provider]  sertraline (ZOLOFT) 100 MG tablet Take 100 mg by mouth daily.   Yes [provider]  Tuberculin-Allergy Syringes 27G X 1/2" 1 ML KIT Inject 1 Syringe into the skin once a week. To be used with weekly methotrexate injections 08/14/16  Yes Deveshwar, SAbel Presto MD  VENTOLIN HFA 108 (90 Base) MCG/ACT inhaler Inhale 2 puffs into the lungs every 6 (six) hours as needed for wheezing or shortness of breath.  06/06/17  Yes [provider]    Family History Family History  Problem Relation Age of Onset  . Breast cancer Mother 694 . Heart disease Mother   . Colon polyps Mother   . Other Mother        pre-cancerous tumor removed  . Colon cancer Mother   . Heart disease Father   . Crohn's disease Sister   . Alcoholism Sister   . Irritable bowel syndrome Unknown   . Crohn's disease Daughter   . Breast cancer Paternal Aunt   . Breast cancer Maternal Grandmother   . Esophageal cancer Neg Hx   . Rectal cancer Neg Hx   . Stomach cancer Neg Hx   . Pancreatic cancer Neg Hx     Social History Social History   Tobacco Use  . Smoking status: Former Smoker    Last attempt to quit: 07/27/2005    Years since quitting: 12.4  . Smokeless tobacco: Never Used  Substance Use Topics  . Alcohol use: Yes    Alcohol/week: 0.0 oz    Comment: occasionally  . Drug use: No     Allergies   Imuran [azathioprine]; Meperidine hcl; Remicade [infliximab]; Sulfa antibiotics; Sulfa drugs cross reactors; Sulfasalazine; Avelox [moxifloxacin]; Doxycycline; Erythromycin; and Latex   Review of Systems Review of Systems  Constitutional: Positive for appetite change. Negative for chills and fever.  HENT: Negative for congestion, rhinorrhea and sore throat.   Eyes: Negative for visual  disturbance.  Respiratory: Negative for cough and shortness of breath.   Cardiovascular: Negative for chest pain.  Gastrointestinal: Positive for abdominal pain, blood in stool, constipation and diarrhea. Negative for  nausea.  Genitourinary: Negative for dysuria and frequency.  Musculoskeletal: Negative for arthralgias and myalgias.  Skin: Negative for color change and rash.  Neurological: Negative for dizziness, syncope and light-headedness.     Physical Exam Updated Vital Signs BP (!) 183/98 (BP Location: Right Arm)   Pulse (!) 110   Temp 98.3 F (36.8 C) (Oral)   Resp 18   Ht _0  (1.676 m)   Wt 95.3 kg (210 lb)   SpO2 97%   BMI 33.89 kg/m   Physical Exam  Constitutional: She appears well-developed and well-nourished.  Non-toxic appearance. She does not appear ill. No distress.  HENT:  Head: Normocephalic and atraumatic.  Mouth/Throat: Oropharynx is clear and moist.  Eyes: Pupils are equal, round, and reactive to light. EOM are normal. Right eye exhibits no discharge. Left eye exhibits no discharge.  Cardiovascular: Normal rate, regular rhythm, normal heart sounds and intact distal pulses.  Pulmonary/Chest: Effort normal and breath sounds normal. No respiratory distress.  Respirations equal and unlabored, patient able to speak in full sentences, lungs clear to auscultation bilaterally  Abdominal: Soft. Normal appearance and bowel sounds are normal. She exhibits no distension. There is generalized tenderness. There is guarding. There is no rigidity, no rebound and no CVA tenderness.  Abdomen soft, nondistended, bowel sounds present throughout, there is generalized tenderness which is most severe across the lower abdomen with some mild guarding, no CVA tenderness  Musculoskeletal: She exhibits no edema or deformity.  Neurological: She is alert. Coordination normal.  Skin: Skin is warm and dry. Capillary refill takes less than 2 seconds. She is not diaphoretic.  Psychiatric:  She has a normal mood and affect. Her behavior is normal.  Nursing note and vitals reviewed.    ED Treatments / Results  Labs (all labs ordered are listed, but only abnormal results are displayed) Labs Reviewed  COMPREHENSIVE METABOLIC PANEL - Abnormal; Notable for the following components:      Result Value   Glucose, Bld 113 (*)    All other components within normal limits  CBC - Abnormal; Notable for the following components:   WBC 12.7 (*)    All other components within normal limits  URINALYSIS, ROUTINE W REFLEX MICROSCOPIC - Abnormal; Notable for the following components:   Color, Urine STRAW (*)    Specific Gravity, Urine 1.003 (*)    All other components within normal limits  GASTROINTESTINAL PANEL BY PCR, STOOL (REPLACES STOOL CULTURE)  LIPASE, BLOOD  I-STAT BETA HCG BLOOD, ED ([facility], [facility], [facility] ONLY)    EKG None  Radiology Ct Abdomen Pelvis W Contrast  Result Date: 01/20/2018 CLINICAL DATA:  Ulcerative colitis flare. EXAM: CT ABDOMEN AND PELVIS WITH CONTRAST TECHNIQUE: Multidetector CT imaging of the abdomen and pelvis was performed using the standard protocol following bolus administration of intravenous contrast. CONTRAST:  125m ISOVUE-300 IOPAMIDOL (ISOVUE-300) INJECTION 61% COMPARISON:  05/11/2017 FINDINGS: Lower chest: Subsegmental atelectasis noted in the lingula. Hepatobiliary: No focal abnormality within the liver parenchyma. There is no evidence for gallstones, gallbladder wall thickening, or pericholecystic fluid. No intrahepatic or extrahepatic biliary dilation. Pancreas: No focal mass lesion. No dilatation of the main duct. No intraparenchymal cyst. No peripancreatic edema. Spleen: No splenomegaly. No focal mass lesion. Adrenals/Urinary Tract: No adrenal nodule or mass. Kidneys unremarkable. No evidence for hydroureter. The urinary bladder appears normal for the degree of distention. Stomach/Bowel: Gastric anatomy suggests prior fundoplication. Duodenum is normally  positioned as is the ligament of Treitz. No small bowel wall thickening. No small  bowel dilatation. The terminal ileum is normal. The appendix is normal. Mild wall thickening and vascular congestion is noted in the sigmoid colon. Vascular/Lymphatic: There is abdominal aortic atherosclerosis without aneurysm. There is no gastrohepatic or hepatoduodenal ligament lymphadenopathy. No intraperitoneal or retroperitoneal lymphadenopathy. No pelvic sidewall lymphadenopathy. Reproductive: Uterus surgically absent.  Insert normal ovaries Other: No intraperitoneal free fluid. Musculoskeletal: No worrisome lytic or sclerotic osseous abnormality. IMPRESSION: 1. Mild wall thickening in the sigmoid colon with associated mild vascular congestion. No appreciable pericolonic edema/inflammation. 2.  Aortic Atherosclerois (ICD10-170.0) Electronically Signed   By: Misty Stanley M.D.   On: 01/20/2018 17:50    Procedures Procedures (including critical care time)  Medications Ordered in ED Medications  iopamidol (ISOVUE-300) 61 % injection (has no administration in time range)  sodium chloride 0.9 % bolus 1,000 mL (0 mLs Intravenous Stopped 01/20/18 1836)  ondansetron (ZOFRAN) injection 4 mg (4 mg Intravenous Given 01/20/18 1643)  dicyclomine (BENTYL) injection 20 mg (20 mg Intramuscular Given 01/20/18 1649)  iopamidol (ISOVUE-300) 61 % injection 100 mL (100 mLs Intravenous Contrast Given 01/20/18 1724)  methylPREDNISolone sodium succinate (SOLU-MEDROL) 125 mg/2 mL injection 60 mg (60 mg Intravenous Given 01/20/18 1939)  ketorolac (TORADOL) 30 MG/ML injection 15 mg (15 mg Intravenous Given 01/20/18 1936)     Initial Impression / Assessment and Plan / ED Course  I have reviewed the triage vital signs and the nursing notes.  Pertinent labs & imaging results that were available during my care of the patient were reviewed by me and considered in my medical decision making (see chart for details).  Patient presents for evaluation of  possible flareup, she has been treating this at home with prednisone as prescribed by her PCP without improvement.  She reports worsening diffuse abdominal pain and her stools consist primarily of blood and pus.  On initial evaluation patient is afebrile, mildly tachycardic with heart rate of 110 and hypertensive.  On exam she has generalized abdominal tenderness with guarding across the lower abdomen.  Will get abdominal labs, will check stool pathogen panel if patient is able to provide stool sample here in the ED, and will get CT abdomen pelvis to assess for abscess, obstruction or any other acute pathology.  Will give IV fluids Zofran and Bentyl for symptomatic management.  Labs show mild leukocytosis of 12.7, although this is not concerning given patient is currently on course of steroids, she has stable hemoglobin, no acute electrolyte derangements, normal renal and liver function and normal lipase.  Urine without any evidence of infection, patient did not have any bowel movements while here in the department, GI pathogen panel unable to be collected.  CT abdomen pelvis shows some mild wall thickening in the sigmoid colon, but is otherwise unremarkable, no evidence of obstruction abscess or other acute GI pathology.  On reevaluation patient does report some improvement in her pain with fluids and Bentyl.  7:37 PM is discussed with Dr. Loletha Carrow with [facility] who reviewed patient's labs and CT scan, and agrees that patient does not need to come into the hospital, this is likely a UC exacerbation that will require steroids in time, recommend 60 of IV Solu-Medrol, and discharged with Bentyl, Percocet for breakthrough pain, and to continue steroid taper that she was prescribed by her PCP and he will forward onto Dr. Silverio Decamp patient's regular GI doctor for follow-up.    Discussed this plan with the patient and she is in agreement, she plans to drive home so requests no narcotic pain  medication here.  Patient  will be given 1 dose of IV Solu-Medrol, and prescriptions for Bentyl and Percocet for breakthrough pain.  Return precautions discussed.  Patient expresses understanding and is in agreement with plan.  Final Clinical Impressions(s) / ED Diagnoses   Final diagnoses:  Exacerbation of ulcerative colitis with complication ([place])  Lower abdominal pain    ED Discharge Orders        Ordered    dicyclomine (BENTYL) 20 MG tablet  2 times daily     01/20/18 1944    oxyCODONE-acetaminophen (PERCOCET) 5-325 MG tablet  Every 6 hours PRN     01/20/18 1944       Jacqlyn Larsen, PA-C 01/21/18 0130    Quintella Reichert, MD 01/25/18 1004

## 2018-01-21 ENCOUNTER — Telehealth: Payer: Self-pay | Admitting: Gastroenterology

## 2018-01-21 NOTE — Telephone Encounter (Signed)
-----   Message from [name], MD sent at 01/20/2018  7:59 PM EDT ----- [name],    UC patient of yours with normal colon May.  Called last week with pain,tenesmus, bleeding and several small BMs/day.  CG took call.  C diff PCR neg.  Came to ED Sun eve with same symptoms and more pain.  PCP had stated prednisone 60 mg daily on 7/3, wasn't helping yet.  WBC 12/7. CMP OK.  I looked at CT done in ED.  Maybe mild mucosal edema but not real impressive.  No stranding or obstruction or constipation.    I advised a dose of IV steroids, dose of opioid IV med, and Rx for Bentyl and discharge home on 24m /day prednisone.  She will need further instructions through clinic nurse.  -Mallie Mussel

## 2018-01-21 NOTE — Telephone Encounter (Signed)
Patient contacted and she will come in tomorrow to see APP.

## 2018-01-21 NOTE — Telephone Encounter (Signed)
Called patient, doing somewhat better. Still having diarrhea with blood and mucus.  C.diff negative.  Reviewed labs and CT findings Will check GI pathogen panel to exclude other infectious etiology, if negative and continue to have persistent symptoms, may need to consider flex sig Continue Prednisone Beth, please schedule office visit soon. Thanks

## 2018-01-22 ENCOUNTER — Encounter: Payer: Self-pay | Admitting: Gastroenterology

## 2018-01-22 ENCOUNTER — Other Ambulatory Visit: Payer: Medicare HMO

## 2018-01-22 ENCOUNTER — Other Ambulatory Visit (INDEPENDENT_AMBULATORY_CARE_PROVIDER_SITE_OTHER): Payer: Medicare HMO

## 2018-01-22 ENCOUNTER — Ambulatory Visit: Payer: Medicare HMO | Admitting: Gastroenterology

## 2018-01-22 ENCOUNTER — Other Ambulatory Visit: Payer: Self-pay | Admitting: Gastroenterology

## 2018-01-22 VITALS — BP 126/78 | HR 74 | Ht 66.0 in | Wt 221.0 lb

## 2018-01-22 DIAGNOSIS — R1032 Left lower quadrant pain: Secondary | ICD-10-CM

## 2018-01-22 DIAGNOSIS — R197 Diarrhea, unspecified: Secondary | ICD-10-CM | POA: Diagnosis not present

## 2018-01-22 DIAGNOSIS — K5100 Ulcerative (chronic) pancolitis without complications: Secondary | ICD-10-CM

## 2018-01-22 DIAGNOSIS — K625 Hemorrhage of anus and rectum: Secondary | ICD-10-CM

## 2018-01-22 DIAGNOSIS — K589 Irritable bowel syndrome without diarrhea: Secondary | ICD-10-CM | POA: Diagnosis not present

## 2018-01-22 LAB — HIGH SENSITIVITY CRP: CRP, High Sensitivity: 3.37 mg/L (ref 0.000–5.000)

## 2018-01-22 LAB — SEDIMENTATION RATE: Sed Rate: 39 mm/hr — ABNORMAL HIGH (ref 0–30)

## 2018-01-22 MED ORDER — MESALAMINE 1000 MG RE SUPP: 1000.0000 mg | Freq: Every day | RECTAL | 1 refills | Status: DC

## 2018-01-22 MED ORDER — PREDNISONE 10 MG PO TABS: ORAL_TABLET | ORAL | 0 refills | Status: AC

## 2018-01-22 NOTE — Progress Notes (Addendum)
01/22/2018 [name] [id number] Jul 20, 1965   HISTORY OF PRESENT ILLNESS:  52 year old female with history of ulcerative colitis diagnosed in 2002 with pan colitis.  Patient also has psoriasis and psoriatic arthritis. She is on methotrexate injections and taking Lialda 2.4 g daily.     Past GI history: [name] is a pleasant 52 year old female who was previously followed by Dr. Olevia Perches. She was diagnosed with ulcerative colitis in 2002. A prior colonoscopy in 2011 showed pancolitis, and repeat colonoscopy in December 2014 showed a normal transverse and right colon but chronic active colitis from 0-30 cm. She has a history of psoriatic arthritis for which she is followed by Dr. Estil Daft. She had tried Remicade but developed antibodies. She has used Humira, Enbrel, and symponi all of which did not control her psoriasis. She was previously on Stelara every 8 weeks as well.  Is on Otezla and she is also on methotrexate weekly. She has a history of GERD and had a Nissen fundoplication approximately 12 years ago. She has intermittent dysphagia to solids and has required dilation in the past. Her last upper endoscopy in February 2011 showed no evidence of Barrett's esophagus. She takes omeprazole 20 mg daily  She presents here today with possible colitis flare.  Says that symptoms started on 6/29 with abdominal bloating and tenesmus.  Passing bloody mucus.  Cdiff negative.  Went to the ED on 7/7 where CT scan abdomen and pelvis with contrast showed the following:  IMPRESSION: 1. Mild wall thickening in the sigmoid colon with associated mild vascular congestion. No appreciable pericolonic edema/inflammation. 2.  Aortic Atherosclerois (ICD10-170.0)  Was given some steroids, which she has been taking now for the past 2 days.  Still with LLQ abdominal pain/cramping.  Colonoscopy 11/14/2017 showed non-bleeding internal hemorrhoid, no active colitis.   Past Medical History:  Diagnosis Date    . Allergy   . Anxiety   . Arthritis   . Asthma   . Cataract    both eyes  surgically repaired  . Depression    treated  . Dyspnea   . Fibrocystic breast disease   . GERD (gastroesophageal reflux disease)   . History of hiatal hernia    repaired  . History of kidney stones    questionable  . Hyperlipidemia   . Hypertension   . Hypothyroidism   . Irritable bowel syndrome   . Ovarian cyst   . Pancreatitis   . PONV (postoperative nausea and vomiting)    NO TROUBLE WITH CATARACT SURGERY  . Psoriasis   . Psoriatic arthritis ([name]) 05/12/2016   Poor response to Humira Inadequate response to Enbrel Inadequate response to Simponi  . Tachycardia   . Ulcerative colitis    Past Surgical History:  Procedure Laterality Date  . ABDOMINAL HYSTERECTOMY    . ANKLE RECONSTRUCTION     left  . bladder tuck    . CATARACT EXTRACTION W/PHACO Right 09/11/2017   Procedure: CATARACT EXTRACTION PHACO AND INTRAOCULAR LENS PLACEMENT (I[facility]);  Surgeon: Birder Robson, MD;  Location: [facility] ORS;  Service: Ophthalmology;  Laterality: Right;  [place] 00:18.5 AP% 6.9 CDE 1.29 Fluid Pack Lot # [id number]  . CATARACT EXTRACTION W/PHACO Left 10/02/2017   Procedure: CATARACT EXTRACTION PHACO AND INTRAOCULAR LENS PLACEMENT ([place]);  Surgeon: Birder Robson, MD;  Location: [facility] ORS;  Service: Ophthalmology;  Laterality: Left;  [place] 00:25.9 AP% 6.9 CDE 1.80 Fluid Pack Lot # [id number]  . COLONOSCOPY    . EYE SURGERY  Lasik  . FRACTURE SURGERY    . KNEE SURGERY     left  . LAPAROSCOPIC NISSEN FUNDOPLICATION N/A 2/53/6644   Procedure: LAPAROSCOPIC NISSEN TAKEDOWN AND UPPER ENDOSCOPY;  Surgeon: Johnathan Hausen, MD;  Location: [facility];  Service: General;  Laterality: N/A;  . NASAL SEPTUM SURGERY    . NISSEN FUNDOPLICATION    . PARTIAL HYSTERECTOMY    . POLYPECTOMY    . repair cystocele and rectocele    . thyroid ablation    . TUBAL LIGATION    . UPPER GASTROINTESTINAL ENDOSCOPY    . UPPER GI ENDOSCOPY   01/12/2017   Procedure: UPPER GI ENDOSCOPY;  Surgeon: Johnathan Hausen, MD;  Location: [facility];  Service: General;;    reports that she quit smoking about 12 years ago. She has never used smokeless tobacco. She reports that she drinks alcohol. She reports that she does not use drugs. family history includes Alcoholism in her sister; Breast cancer in her maternal grandmother and paternal aunt; Breast cancer (age of onset: 67) in her mother; Colon cancer in her mother; Colon polyps in her mother; Crohn's disease in her daughter and sister; Heart disease in her father and mother; Irritable bowel syndrome in her unknown relative; Other in her mother. Allergies  Allergen Reactions  . Imuran [Azathioprine] Other (See Comments)    Pancreatitis  . Meperidine Hcl Hives  . Remicade [Infliximab] Hives and Other (See Comments)    Redness  . Sulfasalazine Swelling and Hives  . Avelox [Moxifloxacin] Rash  . Doxycycline Rash  . Erythromycin Rash  . Latex Rash      Outpatient Encounter Medications as of 01/22/2018  Medication Sig  . aspirin-acetaminophen-caffeine (EXCEDRIN MIGRAINE) 250-250-65 MG tablet Take 2 tablets by mouth every 8 (eight) hours as needed for headache.   Marland Kitchen azelastine (OPTIVAR) 0.05 % ophthalmic solution Place 1 drop into both eyes 2 (two) times daily as needed (for allergies).   . budesonide-formoterol (SYMBICORT) 160-4.5 MCG/ACT inhaler Inhale 2 puffs into the lungs 2 (two) times daily.  . Calcium Carbonate-Vit D-Min (CALTRATE 600+D PLUS PO) Take 1 tablet by mouth daily.  . clobetasol ointment (TEMOVATE) 0.34 % Apply 1 application topically 2 (two) times daily. Use for 4-7 days and wait 1 week before reusing (Patient taking differently: Apply 1 application topically 2 (two) times daily as needed (for psoriasis). Use for 4-7 days and wait 1 week before reusing)  . dicyclomine (BENTYL) 20 MG tablet Take 1 tablet (20 mg total) by mouth 2 (two) times daily.  Marland Kitchen estradiol (ESTRACE) 1 MG tablet  Take 1 mg by mouth daily.  . fluticasone (FLONASE) 50 MCG/ACT nasal spray Place 1 spray into both nostrils daily.   . folic acid (FOLVITE) 1 MG tablet Take 2 mg by mouth daily.  Marland Kitchen ibuprofen (ADVIL,MOTRIN) 200 MG tablet Take 400 mg by mouth every 6 (six) hours as needed for mild pain.   Marland Kitchen levocetirizine (XYZAL) 5 MG tablet Take 5 mg by mouth every evening.  Marland Kitchen levothyroxine (SYNTHROID, LEVOTHROID) 125 MCG tablet Take 125 mcg by mouth every other day.   . levothyroxine (SYNTHROID, LEVOTHROID) 137 MCG tablet Take 137 mcg by mouth every other day.  . mesalamine (LIALDA) 1.2 g EC tablet Take 1.2 g by mouth 2 (two) times daily.  . methotrexate 50 MG/2ML injection INJECT 1 ML SUBCUTANEOUSLY  WEEKLY  . montelukast (SINGULAIR) 10 MG tablet Take 10 mg by mouth at bedtime.  . Multiple Vitamins-Minerals (CENTRUM SILVER ULTRA WOMENS PO) Take 1 tablet  by mouth daily.  Marland Kitchen omeprazole (PRILOSEC) 20 MG capsule TAKE 1 CAPSULE EVERY DAY BEFORE BREAKFAST  . ondansetron (ZOFRAN) 4 MG tablet Take 1 tablet (4 mg total) by mouth every 8 (eight) hours as needed for nausea or vomiting.  Marland Kitchen OTEZLA 30 MG TABS TAKE 1 TABLET BY MOUTH TWICE DAILY  . predniSONE (DELTASONE) 20 MG tablet Take 40m for 3 days and 40 mg for 2 days  . rosuvastatin (CRESTOR) 20 MG tablet Take 20 mg by mouth daily.  . sertraline (ZOLOFT) 100 MG tablet Take 100 mg by mouth daily.  . Tuberculin-Allergy Syringes 27G X 1/2" 1 ML KIT Inject 1 Syringe into the skin once a week. To be used with weekly methotrexate injections  . VENTOLIN HFA 108 (90 Base) MCG/ACT inhaler Inhale 2 puffs into the lungs every 6 (six) hours as needed for wheezing or shortness of breath.   . [DISCONTINUED] clonazePAM (KLONOPIN) 1 MG tablet Take 0.5-1 mg by mouth at bedtime as needed (for sleep).   . [DISCONTINUED] oxyCODONE-acetaminophen (PERCOCET) 5-325 MG tablet Take 1 tablet by mouth every 6 (six) hours as needed.   Facility-Administered Encounter Medications as of 01/22/2018   Medication  . 0.9 %  sodium chloride infusion     REVIEW OF SYSTEMS  : All other systems reviewed and negative except where noted in the History of Present Illness.   PHYSICAL EXAM: BP 126/78   Pulse 74   Ht 5' 6" (1.676 m)   Wt 221 lb (100.2 kg)   BMI 35.67 kg/m  General: Well developed white female in no acute distress Head: Normocephalic and atraumatic Eyes:  Sclerae anicteric, conjunctiva pink. Ears: Normal auditory acuity Lungs: Clear throughout to auscultation; no increased WOB. Heart: Regular rate and rhythm; no M/RG. Abdomen: Soft, non-distended.  BS present.  LLQ TTP. Musculoskeletal: Symmetrical with no gross deformities  Skin: No lesions on visible extremities Extremities: No edema  Neurological: Alert oriented x 4, grossly non-focal Psychological:  Alert and cooperative. Normal mood and affect  ASSESSMENT AND PLAN: **52year old female with history of ulcerative pan colitis diagnosed in 2002, psoriasis and psoriatic arthritis currently on Otezla, methotrexate, and Lialda 2.4 grams.  Now with what appears to be a flare of symptoms with LLQ abdominal pain and rectal bleeding with mucus.  Cdiff negative.  Will check GI pathogen panel.  Will check sed rate and CRP.  Colonoscopy in May showed no active colitis.  Was given a couple of doses of prednisone after ED visit with no taper.  Will have her continue prednisone 40 mg daily for 2 weeks and taper by 10 mg weekly after that.  Will also add Canasa suppositories at bedtime.  CC:  SMayra Neer MD

## 2018-01-22 NOTE — Patient Instructions (Signed)
Your provider has requested that you go to the basement level for lab work before leaving today. Press "B" on the elevator. The lab is located at the first door on the left as you exit the elevator.  We have sent the following medications to your pharmacy for you to pick up at your convenience:  Canasa suppositories at bedtime.   Prednisone taper  40 mg for 2 weeks 30 mg for 1 week  20 mg for 1 week  10 mg for 1 week   Then discontinue

## 2018-01-23 ENCOUNTER — Other Ambulatory Visit: Payer: Self-pay | Admitting: Emergency Medicine

## 2018-01-28 LAB — GASTROINTESTINAL PATHOGEN PANEL PCR
C. difficile Tox A/B, PCR: NOT DETECTED
Campylobacter, PCR: NOT DETECTED
Cryptosporidium, PCR: NOT DETECTED
E coli (ETEC) LT/ST PCR: NOT DETECTED
E coli (STEC) stx1/stx2, PCR: NOT DETECTED
E coli 0157, PCR: NOT DETECTED
Giardia lamblia, PCR: NOT DETECTED
Norovirus, PCR: NOT DETECTED
Rotavirus A, PCR: NOT DETECTED
Salmonella, PCR: NOT DETECTED
Shigella, PCR: NOT DETECTED

## 2018-02-05 ENCOUNTER — Other Ambulatory Visit: Payer: Self-pay | Admitting: Rheumatology

## 2018-02-05 ENCOUNTER — Telehealth: Payer: Self-pay | Admitting: Rheumatology

## 2018-02-05 NOTE — Telephone Encounter (Signed)
Patient called stating at her last appointment she discussed switching medications to the [facility].  Patient states she has decided to fill out the application for the patient assistance program and will drop it by the office today.  Patient states she is currently having a flair-up of her psoriatic arthritis and ulcerative collitis.

## 2018-02-05 NOTE — Progress Notes (Signed)
Reviewed and agree with documentation and assessment and plan.  If continues to have persistent symptoms, will consider flexible sigmoidoscopy for evaluation.  Damaris Hippo , MD

## 2018-02-05 NOTE — Telephone Encounter (Signed)
Last Visit: 12/06/17 Next visit: 05/07/18   Okay to refill per Dr. Estanislado Pandy

## 2018-02-06 ENCOUNTER — Telehealth: Payer: Self-pay | Admitting: Gastroenterology

## 2018-02-06 DIAGNOSIS — Z9225 Personal history of immunosupression therapy: Secondary | ICD-10-CM

## 2018-02-06 DIAGNOSIS — L4050 Arthropathic psoriasis, unspecified: Secondary | ICD-10-CM

## 2018-02-06 NOTE — Telephone Encounter (Signed)
Dr Silverio Decamp  Please advise

## 2018-02-06 NOTE — Telephone Encounter (Signed)
Patient states she is having a flare of the UC and Psoriatic Arthritis. Patient states she was put on Prednisone by her GI and is following that taper. She has decided to try the [facility] as discussed with you and has dropped off the patient assistance application. Patient would like to know if you will start her on the higher dose of the Xeljanx so it helps with both of her UC and Psoriatic Arthritis.

## 2018-02-06 NOTE — Telephone Encounter (Signed)
Patient advised.

## 2018-02-06 NOTE — Telephone Encounter (Signed)
I would start her on the dose suggested for psoriatic arthritis unless her gastroenterologist wants to place her on the higher dose of Morrie Sheldon

## 2018-02-07 ENCOUNTER — Telehealth: Payer: Self-pay | Admitting: Rheumatology

## 2018-02-07 NOTE — Telephone Encounter (Signed)
Hepatitis Panel 10/21/09 negative Immunoglobulins WNL   Patient will come to office 02/08/18 to have the other labs drawn. 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 Patient last had a chest x-ray in 2017. Does she need to update her chest x-ray?

## 2018-02-07 NOTE — Telephone Encounter (Signed)
Please notify patient that her gastroenterologist wants her to take Xeljanz at 5 mg p.o. twice daily.  Please check in the computer that we do have SPEP, immunoglobulins, hepatitis panel, HIV, TB Gold, fasting lipid prior to sending the prescription for Xeljanz.  When she starts Morrie Sheldon she will need labs in 1 month x 2 and then every 3 months.  She will need fasting lipid again in 2 months after starting Morrie Sheldon.

## 2018-02-07 NOTE — Telephone Encounter (Signed)
Patient states she she talked with Dr. Silverio Decamp and she recommends Xeljanz 5 mg BID.

## 2018-02-07 NOTE — Telephone Encounter (Signed)
Patient called requesting to speak with you directly.

## 2018-02-07 NOTE — Telephone Encounter (Signed)
Yes she needs chest x-ray.

## 2018-02-07 NOTE — Telephone Encounter (Signed)
[name] will help her psoriasis and also ulcerative colitis. I will recommend starting with lower dose 10m twice daily even though for UC we can use higher dose due to potential side effects with high dose. Schedule follow up visit in office in 4-6 weeks . Thanks

## 2018-02-07 NOTE — Addendum Note (Signed)
Addended by: Carole Binning on: 02/07/2018 01:55 PM   Modules accepted: Orders

## 2018-02-07 NOTE — Telephone Encounter (Signed)
Called patient and informed her of Dr Jillyn Hidden recommendation she will keep her follow up appointment in September and the pt  will inform Dr Lorelee Cover

## 2018-02-08 ENCOUNTER — Ambulatory Visit (HOSPITAL_COMMUNITY)
Admission: RE | Admit: 2018-02-08 | Discharge: 2018-02-08 | Disposition: A | Discharge status: Home / Self Care | Payer: Medicare HMO | Source: Ambulatory Visit | Attending: Rheumatology | Admitting: Rheumatology

## 2018-02-08 ENCOUNTER — Other Ambulatory Visit: Payer: Self-pay

## 2018-02-08 DIAGNOSIS — Z9225 Personal history of immunosupression therapy: Secondary | ICD-10-CM

## 2018-02-08 DIAGNOSIS — L4050 Arthropathic psoriasis, unspecified: Secondary | ICD-10-CM

## 2018-02-08 DIAGNOSIS — Z01818 Encounter for other preprocedural examination: Secondary | ICD-10-CM | POA: Diagnosis not present

## 2018-02-08 NOTE — Telephone Encounter (Signed)
Patient advised she will need chest x-ray and order has been placed.

## 2018-02-08 NOTE — Progress Notes (Signed)
normal

## 2018-02-08 NOTE — Addendum Note (Signed)
Addended by: Carole Binning on: 02/08/2018 09:13 AM   Modules accepted: Orders

## 2018-02-08 NOTE — Addendum Note (Signed)
Addended by: Nena Jordan on: 02/08/2018 09:36 AM   Modules accepted: Orders

## 2018-02-12 ENCOUNTER — Telehealth: Payer: Self-pay | Admitting: Pharmacy Technician

## 2018-02-12 LAB — PROTEIN ELECTROPHORESIS, SERUM, WITH REFLEX
Albumin ELP: 3.7 g/dL — ABNORMAL LOW (ref 3.8–4.8)
Alpha 1: 0.3 g/dL (ref 0.2–0.3)
Alpha 2: 0.8 g/dL (ref 0.5–0.9)
Beta 2: 0.4 g/dL (ref 0.2–0.5)
Beta Globulin: 0.6 g/dL (ref 0.4–0.6)
Gamma Globulin: 1 g/dL (ref 0.8–1.7)
Total Protein: 6.8 g/dL (ref 6.1–8.1)

## 2018-02-12 LAB — LIPID PANEL
Cholesterol: 171 mg/dL (ref <200)
HDL: 82 mg/dL (ref >50)
LDL Cholesterol (Calc): 68 mg/dL (calc)
Non-HDL Cholesterol (Calc): 89 mg/dL (calc) (ref <130)
Total CHOL/HDL Ratio: 2.1 (calc) (ref <5.0)
Triglycerides: 124 mg/dL (ref <150)

## 2018-02-12 LAB — IFE INTERPRETATION: Immunofix Electr Int: NOT DETECTED

## 2018-02-12 LAB — HIV ANTIBODY (ROUTINE TESTING W REFLEX): HIV 1&2 Ab, 4th Generation: NONREACTIVE

## 2018-02-12 NOTE — Telephone Encounter (Signed)
Submitted application for Morrie Sheldon to [employer]. Will follow up to confirm receipt.  Will send document to scan center.   Phone# [phone] Fax# [phone]   1:25 PM [name], CPhT

## 2018-02-13 NOTE — Telephone Encounter (Signed)
Received a Prior Authorization request from [employer] for Xeljanz 99m. Authorization has been submitted to patient's insurance via Cover My Meds. Will update once we receive a response.  3:11 PM RBeatriz Chancellor CPhT

## 2018-02-18 NOTE — Telephone Encounter (Signed)
Clinical questions generated for [place] on Covermymeds. Completed and submitted to [facility]. Awaiting response. Will update once we receive a response.  11:44 AM [name], CPhT

## 2018-02-20 NOTE — Telephone Encounter (Signed)
Received a fax from [facility] regarding a prior authorization for Xeljanz 46m. Authorization has been APPROVED from 02/20/18 to 07/16/18.   Will send document to scan center.  ID# [id number] # [phone] 11:20 AM RBeatriz Chancellor CPhT

## 2018-02-20 NOTE — Telephone Encounter (Signed)
Called [employer], spoke to [place], advised PA was approved through 07/16/18. She requested document to be faxed to them. Rep confirmed all information needed for application was received. Awaiting outcome for patient assistance.  Will send documents to scan center.  Phone# [phone] Fax# [phone]  11:37 AM [name], CPhT

## 2018-02-26 DIAGNOSIS — R05 Cough: Secondary | ICD-10-CM | POA: Diagnosis not present

## 2018-02-26 DIAGNOSIS — D849 Immunodeficiency, unspecified: Secondary | ICD-10-CM | POA: Diagnosis not present

## 2018-02-26 DIAGNOSIS — R0602 Shortness of breath: Secondary | ICD-10-CM | POA: Diagnosis not present

## 2018-02-26 DIAGNOSIS — R062 Wheezing: Secondary | ICD-10-CM | POA: Diagnosis not present

## 2018-02-28 NOTE — Telephone Encounter (Signed)
Received approval for Patient Assistance from [place] for [employer]. Patient has been approved for assistance through 07/16/18. Called patient to advise that they will be calling to schedule shipment, she had no questions at this time.  Will send document to scan center.  Phone# [phone]  3:55 PM [name], CPhT

## 2018-03-12 DIAGNOSIS — E039 Hypothyroidism, unspecified: Secondary | ICD-10-CM | POA: Diagnosis not present

## 2018-03-12 DIAGNOSIS — I10 Essential (primary) hypertension: Secondary | ICD-10-CM | POA: Diagnosis not present

## 2018-03-12 DIAGNOSIS — E669 Obesity, unspecified: Secondary | ICD-10-CM | POA: Diagnosis not present

## 2018-03-12 DIAGNOSIS — Z6835 Body mass index (BMI) 35.0-35.9, adult: Secondary | ICD-10-CM | POA: Diagnosis not present

## 2018-03-12 DIAGNOSIS — J42 Unspecified chronic bronchitis: Secondary | ICD-10-CM | POA: Diagnosis not present

## 2018-03-12 DIAGNOSIS — K5190 Ulcerative colitis, unspecified, without complications: Secondary | ICD-10-CM | POA: Diagnosis not present

## 2018-03-12 DIAGNOSIS — E7800 Pure hypercholesterolemia, unspecified: Secondary | ICD-10-CM | POA: Diagnosis not present

## 2018-03-26 ENCOUNTER — Encounter: Payer: Self-pay | Admitting: Emergency Medicine

## 2018-03-26 ENCOUNTER — Ambulatory Visit: Payer: Medicare HMO | Admitting: Emergency Medicine

## 2018-03-26 VITALS — BP 132/80 | HR 93 | Ht 66.0 in | Wt 225.0 lb

## 2018-03-26 DIAGNOSIS — R0602 Shortness of breath: Secondary | ICD-10-CM

## 2018-03-26 DIAGNOSIS — J449 Chronic obstructive pulmonary disease, unspecified: Secondary | ICD-10-CM | POA: Insufficient documentation

## 2018-03-26 DIAGNOSIS — L4050 Arthropathic psoriasis, unspecified: Secondary | ICD-10-CM | POA: Diagnosis not present

## 2018-03-26 DIAGNOSIS — R062 Wheezing: Secondary | ICD-10-CM

## 2018-03-26 NOTE — Assessment & Plan Note (Signed)
She had what sounds like persistent cough, wheeze in the aftermath of a URI, consistent with bronchospasm.  This is finally improved after treatment with antibiotics, prednisone.  She is also been started on both Singulair and Symbicort.  She feels that she benefits at times from LABA.  Entire episode sounds like obstructive lung disease.  Certainly she may have asthma with some superimposed COPD from her smoking history.  It is possible that this is all upper airway in nature.  We need pulmonary function testing to try and discern between the 2.  Please stop Symbicort for now.  Keep albuterol available to use 2 puffs up to every 4 hours if needed  We will perform full pulmonary function testing at your next visit. Please continue Xyzal and Singulair as you have been taking them for now. Follow with Dr Lamonte Sakai next available with full PFT

## 2018-03-26 NOTE — Patient Instructions (Addendum)
Please stop Symbicort for now.  Keep albuterol available to use 2 puffs up to every 4 hours if needed  We will perform full pulmonary function testing at your next visit. Please continue Xyzal and Singulair as you have been taking them for now. Follow with Dr Lamonte Sakai next available with full PFT

## 2018-03-26 NOTE — Progress Notes (Signed)
Subjective:    Patient ID: [name], female    DOB: 11/18/1965, 52 y.o.   MRN: [id number]  HPI 52 year old former smoker (25 pack years) with a history of ulcerative colitis (pancolitis), psoriatic arthritis on methotrexate.  She is also been on several different biologic therapies, currently using Xeljanz.  She has had allergy testing in May 2019, a lot of sneezing, rhinitis. She is referred here today for wheezing and bronchitic symptoms.   She had URI sx in June that led to cough that persisted. Treated with abx and pred x 3. Also developed some wheeze - responded to BD in PCP office. She was given Symbicort in May. Cough is now better, not using any albuterol right now. She can have dyspnea w exertion, has noticed for about 6 months.   CXR 02/08/18 reviewed and looks normal.    Review of Systems  Constitutional: Negative for fever and unexpected weight change.  HENT: Positive for congestion, ear pain, postnasal drip, sinus pressure and sneezing. Negative for dental problem, nosebleeds, rhinorrhea and trouble swallowing.   Eyes: Negative for redness and itching.  Respiratory: Positive for cough, shortness of breath and wheezing. Negative for chest tightness.   Cardiovascular: Negative for palpitations and leg swelling.  Gastrointestinal: Negative for nausea and vomiting.  Genitourinary: Negative for dysuria.  Musculoskeletal: Negative for joint swelling.  Skin: Negative for rash.  Allergic/Immunologic: Negative.  Negative for environmental allergies, food allergies and immunocompromised state.  Neurological: Negative for headaches.  Hematological: Does not bruise/bleed easily.  Psychiatric/Behavioral: Negative for dysphoric mood. The patient is not nervous/anxious.     Past Medical History:  Diagnosis Date  . Allergy   . Anxiety   . Arthritis   . Asthma   . Cataract    both eyes  surgically repaired  . Depression    treated  . Dyspnea   . Fibrocystic breast disease     . GERD (gastroesophageal reflux disease)   . History of hiatal hernia    repaired  . History of kidney stones    questionable  . Hyperlipidemia   . Hypertension   . Hypothyroidism   . Irritable bowel syndrome   . Ovarian cyst   . Pancreatitis   . PONV (postoperative nausea and vomiting)    NO TROUBLE WITH CATARACT SURGERY  . Psoriasis   . Psoriatic arthritis ([place]) 05/12/2016   Poor response to Humira Inadequate response to Enbrel Inadequate response to Simponi  . Tachycardia   . Ulcerative colitis      Family History  Problem Relation Age of Onset  . Breast cancer Mother 8  . Heart disease Mother   . Colon polyps Mother   . Other Mother        pre-cancerous tumor removed  . Colon cancer Mother   . Heart disease Father   . Crohn's disease Sister   . Alcoholism Sister   . Irritable bowel syndrome Unknown   . Crohn's disease Daughter   . Breast cancer Paternal Aunt   . Breast cancer Maternal Grandmother   . Esophageal cancer Neg Hx   . Rectal cancer Neg Hx   . Stomach cancer Neg Hx   . Pancreatic cancer Neg Hx      Social History   Socioeconomic History  . Marital status: Married    Spouse name: Not on file  . Number of children: Not on file  . Years of education: Not on file  . Highest education level: Not on file  Occupational History  . 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 Occupation: disabled  Social Needs  . Financial resource strain: Not on file  . Food insecurity:    Worry: Not on file    Inability: Not on file  . Transportation needs:    Medical: Not on file    Non-medical: Not on file  Tobacco Use  . Smoking status: Former Smoker    Last attempt to quit: 07/27/2005    Years since quitting: 12.6  . Smokeless tobacco: Never Used  Substance and Sexual Activity  . Alcohol use: Yes    Alcohol/week: 0.0 standard drinks    Comment: occasionally  . Drug use: No  . Sexual activity: Yes  Lifestyle  . Physical  activity:    Days per week: Not on file    Minutes per session: Not on file  . Stress: Not on file  Relationships  . Social connections:    Talks on phone: Not on file    Gets together: Not on file    Attends religious service: Not on file    Active member of club or organization: Not on file    Attends meetings of clubs or organizations: Not on file    Relationship status: Not on file  . Intimate partner violence:    Fear of current or ex partner: Not on file    Emotionally abused: Not on file    Physically abused: Not on file    Forced sexual activity: Not on file  Other Topics Concern  . Not on file  Social History Narrative  . Not on file     Allergies  Allergen Reactions  . Imuran [Azathioprine] Other (See Comments)    Pancreatitis  . Meperidine Hcl Hives  . Remicade [Infliximab] Hives and Other (See Comments)    Redness  . Sulfasalazine Swelling and Hives  . Avelox [Moxifloxacin] Rash  . Doxycycline Rash  . Erythromycin Rash  . Latex Rash     Outpatient Medications Prior to Visit  Medication Sig Dispense Refill  . aspirin-acetaminophen-caffeine (EXCEDRIN MIGRAINE) 250-250-65 MG tablet Take 2 tablets by mouth every 8 (eight) hours as needed for headache.     Marland Kitchen azelastine (OPTIVAR) 0.05 % ophthalmic solution Place 1 drop into both eyes 2 (two) times daily as needed (for allergies).     . budesonide-formoterol (SYMBICORT) 160-4.5 MCG/ACT inhaler Inhale 2 puffs into the lungs 2 (two) times daily.    . Calcium Carbonate-Vit D-Min (CALTRATE 600+D PLUS PO) Take 1 tablet by mouth daily.    . clobetasol ointment (TEMOVATE) 7.32 % Apply 1 application topically 2 (two) times daily. Use for 4-7 days and wait 1 week before reusing (Patient taking differently: Apply 1 application topically 2 (two) times daily as needed (for psoriasis). Use for 4-7 days and wait 1 week before reusing) 30 g 1  . dicyclomine (BENTYL) 20 MG tablet Take 1 tablet (20 mg total) by mouth 2 (two) times  daily. 20 tablet 0  . estradiol (ESTRACE) 1 MG tablet Take 1 mg by mouth daily.    . fluticasone (FLONASE) 50 MCG/ACT nasal spray Place 1 spray into both nostrils daily.     . folic acid (FOLVITE) 1 MG tablet Take 2 mg by mouth daily.    Marland Kitchen ibuprofen (ADVIL,MOTRIN) 200 MG tablet Take 400 mg by mouth every 6 (six) hours as needed for mild pain.     Marland Kitchen levocetirizine (XYZAL) 5 MG tablet Take  5 mg by mouth every evening.    Marland Kitchen levothyroxine (SYNTHROID, LEVOTHROID) 125 MCG tablet Take 125 mcg by mouth every other day.     . levothyroxine (SYNTHROID, LEVOTHROID) 137 MCG tablet Take 137 mcg by mouth every other day.    . mesalamine (CANASA) 1000 MG suppository Place 1 suppository (1,000 mg total) rectally at bedtime. 30 suppository 1  . mesalamine (LIALDA) 1.2 g EC tablet Take 1.2 g by mouth 2 (two) times daily.    . methotrexate 50 MG/2ML injection INJECT 1 ML SUBCUTANEOUSLY  WEEKLY 12 mL 0  . montelukast (SINGULAIR) 10 MG tablet Take 10 mg by mouth at bedtime.    . Multiple Vitamins-Minerals (CENTRUM SILVER ULTRA WOMENS PO) Take 1 tablet by mouth daily.    Marland Kitchen omeprazole (PRILOSEC) 20 MG capsule TAKE 1 CAPSULE EVERY DAY BEFORE BREAKFAST 90 capsule 3  . ondansetron (ZOFRAN) 4 MG tablet Take 1 tablet (4 mg total) by mouth every 8 (eight) hours as needed for nausea or vomiting. 30 tablet 5  . rosuvastatin (CRESTOR) 20 MG tablet Take 20 mg by mouth daily.    . sertraline (ZOLOFT) 100 MG tablet Take 100 mg by mouth daily.    . Tofacitinib Citrate (XELJANZ PO) Take 5 mg by mouth 2 (two) times daily.    . Tuberculin-Allergy Syringes 27G X 1/2" 1 ML KIT Inject 1 Syringe into the skin once a week. To be used with weekly methotrexate injections 12 each 1  . VENTOLIN HFA 108 (90 Base) MCG/ACT inhaler Inhale 2 puffs into the lungs every 6 (six) hours as needed for wheezing or shortness of breath.     . OTEZLA 30 MG TABS TAKE 1 TABLET BY MOUTH TWICE DAILY (Patient not taking: Reported on 03/26/2018) 180 tablet 0  .  predniSONE (DELTASONE) 20 MG tablet Take 19m for 3 days and 40 mg for 2 days     Facility-Administered Medications Prior to Visit  Medication Dose Route Frequency Provider Last Rate Last Dose  . 0.9 %  sodium chloride infusion  500 mL Intravenous Once NMauri Pole MD            Objective:   Physical Exam Vitals:   03/26/18 1416  BP: 132/80  Pulse: 93  SpO2: 95%  Weight: 225 lb (102.1 kg)  Height: 5' 6" (1.676 m)   Gen: Pleasant, overwt woman, in no distress,  normal affect  ENT: No lesions,  mouth clear,  oropharynx clear, no postnasal drip  Neck: No JVD, no stridor  Lungs: No use of accessory muscles, no wheeze, decreased at bases  Cardiovascular: RRR, heart sounds normal, no murmur or gallops, no peripheral edema  Musculoskeletal: No deformities, no cyanosis or clubbing  Neuro: alert, non focal  Skin: Warm, no lesions or rash      Assessment & Plan:  Wheeze She had what sounds like persistent cough, wheeze in the aftermath of a URI, consistent with bronchospasm.  This is finally improved after treatment with antibiotics, prednisone.  She is also been started on both Singulair and Symbicort.  She feels that she benefits at times from LABA.  Entire episode sounds like obstructive lung disease.  Certainly she may have asthma with some superimposed COPD from her smoking history.  It is possible that this is all upper airway in nature.  We need pulmonary function testing to try and discern between the 2.  Please stop Symbicort for now.  Keep albuterol available to use 2 puffs up to every 4  hours if needed  We will perform full pulmonary function testing at your next visit. Please continue Xyzal and Singulair as you have been taking them for now. Follow with Dr Lamonte Sakai next available with full PFT  Baltazar Apo, MD, PhD 03/26/2018, 2:52 PM [facility] [phone] or if no answer [phone]

## 2018-03-26 NOTE — Assessment & Plan Note (Signed)
On biologic therapy as well as methotrexate.  I do not see any evidence currently before interstitial lung disease related to either autoimmune process or methotrexate.  Her chest x-ray from July was clear.  We will continue to follow

## 2018-03-28 ENCOUNTER — Encounter: Payer: Self-pay | Admitting: Gastroenterology

## 2018-03-28 ENCOUNTER — Ambulatory Visit: Payer: Medicare HMO | Admitting: Gastroenterology

## 2018-03-28 ENCOUNTER — Other Ambulatory Visit (INDEPENDENT_AMBULATORY_CARE_PROVIDER_SITE_OTHER): Payer: Medicare HMO

## 2018-03-28 VITALS — BP 140/84 | HR 76 | Ht 66.0 in | Wt 224.5 lb

## 2018-03-28 DIAGNOSIS — K5190 Ulcerative colitis, unspecified, without complications: Secondary | ICD-10-CM | POA: Diagnosis not present

## 2018-03-28 LAB — HIGH SENSITIVITY CRP: CRP, High Sensitivity: 2.39 mg/L (ref 0.000–5.000)

## 2018-03-28 LAB — FERRITIN: Ferritin: 55.2 ng/mL (ref 10.0–291.0)

## 2018-03-28 LAB — FOLATE: Folate: 12.4 ng/mL (ref >5.9)

## 2018-03-28 LAB — VITAMIN B12: Vitamin B-12: 569 pg/mL (ref 211–911)

## 2018-03-28 NOTE — Progress Notes (Signed)
[name]    [id number]    1965/11/10  Primary Care Physician:Shaw, Nathen May, MD  Referring Physician: Mayra Neer, MD [address], [place] [address]  Chief complaint: Ulcerative colitis HPI:  52 year old female with history of ulcerative colitis, psoriasis and psoriatic arthritis here for follow-up visit.  She is doing well overall since she was initiated on [place] 6 weeks ago.  She is having 1-2 formed stools daily.  Denies any diarrhea, mucus, blood in stool, nausea, vomiting or abdominal pain.  She has occasional left-sided discomfort if she gets constipated. She was last seen in office by Alonza Bogus January 22, 2018 for possible colitis flare, GI pathogen panel negative for infectious etiology CT abdomen pelvis with contrast showed mild wall thickening and sigmoid colon, no pericolonic inflammation or abscess.  She was given a course of prednisone taper starting at 40 mg daily and was instructed to taper down by 10 mg every week.   GI history Ulcerative colitis diagnosed in 2002, colonoscopy 2011 showed pancolitis.  Colonoscopy December 2014 showed chronic active colitis from 0 to 30 cm and normal transverse and right colon. Colonoscopy Nov 15, 2007 negative for active colitis  Chronic GERD status post Nissen fundoplication EGD February 2011 negative for Barrett's esophagus   Psoriasis and psoriatic arthritis managed by Dr. Patrecia Pour.  She failed Remicade, developed antibodies.  She did not respond to Humira, Enbrel and Simponi.  She was also on [place] with no significant improvement in addition to methotrexate weekly.   Outpatient Encounter Medications as of 03/28/2018  Medication Sig  . aspirin-acetaminophen-caffeine (EXCEDRIN MIGRAINE) 250-250-65 MG tablet Take 2 tablets by mouth every 8 (eight) hours as needed for headache.   Marland Kitchen azelastine (OPTIVAR) 0.05 % ophthalmic solution Place 1 drop into both eyes 2 (two) times  daily as needed (for allergies).   . Calcium Carbonate-Vit D-Min (CALTRATE 600+D PLUS PO) Take 1 tablet by mouth daily.  . clobetasol ointment (TEMOVATE) 3.78 % Apply 1 application topically 2 (two) times daily. Use for 4-7 days and wait 1 week before reusing (Patient taking differently: Apply 1 application topically 2 (two) times daily as needed (for psoriasis). Use for 4-7 days and wait 1 week before reusing)  . dicyclomine (BENTYL) 20 MG tablet Take 1 tablet (20 mg total) by mouth 2 (two) times daily. (Patient taking differently: Take 20 mg by mouth as needed. )  . estradiol (ESTRACE) 1 MG tablet Take 1 mg by mouth daily.  . fluticasone (FLONASE) 50 MCG/ACT nasal spray Place 1 spray into both nostrils daily.   . folic acid (FOLVITE) 1 MG tablet Take 2 mg by mouth daily.  Marland Kitchen ibuprofen (ADVIL,MOTRIN) 200 MG tablet Take 400 mg by mouth every 6 (six) hours as needed for mild pain.   Marland Kitchen levocetirizine (XYZAL) 5 MG tablet Take 5 mg by mouth every evening.  Marland Kitchen levothyroxine (SYNTHROID, LEVOTHROID) 125 MCG tablet Take 125 mcg by mouth every other day.   . levothyroxine (SYNTHROID, LEVOTHROID) 137 MCG tablet Take 137 mcg by mouth every other day.  . methotrexate 50 MG/2ML injection INJECT 1 ML SUBCUTANEOUSLY  WEEKLY  . montelukast (SINGULAIR) 10 MG tablet Take 10 mg by mouth at bedtime.  . Multiple Vitamins-Minerals (CENTRUM SILVER ULTRA WOMENS PO) Take 1 tablet by mouth daily.  Marland Kitchen omeprazole (PRILOSEC) 20 MG capsule TAKE 1 CAPSULE EVERY DAY BEFORE BREAKFAST  . ondansetron (ZOFRAN) 4 MG tablet Take 1 tablet (  4 mg total) by mouth every 8 (eight) hours as needed for nausea or vomiting.  . rosuvastatin (CRESTOR) 20 MG tablet Take 20 mg by mouth daily.  . sertraline (ZOLOFT) 100 MG tablet Take 100 mg by mouth daily.  . Tofacitinib Citrate (XELJANZ PO) Take 5 mg by mouth 2 (two) times daily.  . Tuberculin-Allergy Syringes 27G X 1/2" 1 ML KIT Inject 1 Syringe into the skin once a week. To be used with weekly  methotrexate injections  . VENTOLIN HFA 108 (90 Base) MCG/ACT inhaler Inhale 2 puffs into the lungs every 6 (six) hours as needed for wheezing or shortness of breath.   . [DISCONTINUED] budesonide-formoterol (SYMBICORT) 160-4.5 MCG/ACT inhaler Inhale 2 puffs into the lungs 2 (two) times daily.  . [DISCONTINUED] mesalamine (CANASA) 1000 MG suppository Place 1 suppository (1,000 mg total) rectally at bedtime.  . [DISCONTINUED] mesalamine (LIALDA) 1.2 g EC tablet Take 1.2 g by mouth 2 (two) times daily.  . [DISCONTINUED] OTEZLA 30 MG TABS TAKE 1 TABLET BY MOUTH TWICE DAILY (Patient not taking: Reported on 03/26/2018)  . [DISCONTINUED] predniSONE (DELTASONE) 20 MG tablet Take 65m for 3 days and 40 mg for 2 days   Facility-Administered Encounter Medications as of 03/28/2018  Medication  . 0.9 %  sodium chloride infusion    Allergies as of 03/28/2018 - Review Complete 03/28/2018  Allergen Reaction Noted  . Imuran [azathioprine] Other (See Comments) 08/24/2009  . Meperidine hcl Hives 08/24/2009  . Remicade [infliximab] Hives and Other (See Comments) 01/26/2011  . Sulfasalazine Swelling and Hives 08/04/2011  . Avelox [moxifloxacin] Rash 08/24/2009  . Doxycycline Rash   . Erythromycin Rash 08/24/2009  . Latex Rash 08/24/2009    Past Medical History:  Diagnosis Date  . Allergy   . Anxiety   . Arthritis   . Asthma   . Cataract    both eyes  surgically repaired  . Depression    treated  . Dyspnea   . Fibrocystic breast disease   . GERD (gastroesophageal reflux disease)   . History of hiatal hernia    repaired  . History of kidney stones    questionable  . Hyperlipidemia   . Hypertension   . Hypothyroidism   . Irritable bowel syndrome   . Ovarian cyst   . Pancreatitis   . PONV (postoperative nausea and vomiting)    NO TROUBLE WITH CATARACT SURGERY  . Psoriasis   . Psoriatic arthritis ([place] 05/12/2016   Poor response to Humira Inadequate response to Enbrel Inadequate response to  Simponi  . Tachycardia   . Ulcerative colitis     Past Surgical History:  Procedure Laterality Date  . ABDOMINAL HYSTERECTOMY    . ANKLE RECONSTRUCTION     left  . bladder tuck    . CATARACT EXTRACTION W/PHACO Right 09/11/2017   Procedure: CATARACT EXTRACTION PHACO AND INTRAOCULAR LENS PLACEMENT (IOC);  Surgeon: PBirder Robson MD;  Location: [facility] ORS;  Service: Ophthalmology;  Laterality: Right;  [place]00:18.5 AP% 6.9 CDE 1.29 Fluid Pack Lot # [id number]  . CATARACT EXTRACTION W/PHACO Left 10/02/2017   Procedure: CATARACT EXTRACTION PHACO AND INTRAOCULAR LENS PLACEMENT ([place];  Surgeon: PBirder Robson MD;  Location: [facility] ORS;  Service: Ophthalmology;  Laterality: Left;  [place]:25.9 AP% 6.9 CDE 1.80 Fluid Pack Lot # [id number]  . COLONOSCOPY    . EYE SURGERY     Lasik  . FRACTURE SURGERY    . KNEE SURGERY     left  . LAPAROSCOPIC NISSEN FUNDOPLICATION N/A  01/12/2017   Procedure: LAPAROSCOPIC NISSEN TAKEDOWN AND UPPER ENDOSCOPY;  Surgeon: Johnathan Hausen, MD;  Location: [facility];  Service: General;  Laterality: N/A;  . NASAL SEPTUM SURGERY    . NISSEN FUNDOPLICATION    . PARTIAL HYSTERECTOMY    . POLYPECTOMY    . repair cystocele and rectocele    . thyroid ablation    . TUBAL LIGATION    . UPPER GASTROINTESTINAL ENDOSCOPY    . UPPER GI ENDOSCOPY  01/12/2017   Procedure: UPPER GI ENDOSCOPY;  Surgeon: Johnathan Hausen, MD;  Location: [facility];  Service: General;;    Family History  Problem Relation Age of Onset  . Breast cancer Mother 62  . Heart disease Mother   . Colon polyps Mother   . Other Mother        pre-cancerous tumor removed  . Colon cancer Mother   . Heart disease Father   . Crohn's disease Sister   . Alcoholism Sister   . Irritable bowel syndrome Unknown   . Crohn's disease Daughter   . Breast cancer Paternal Aunt   . Breast cancer Maternal Grandmother   . Esophageal cancer Neg Hx   . Rectal cancer Neg Hx   . Stomach cancer Neg Hx   . Pancreatic cancer Neg Hx      Social History   Socioeconomic History  . Marital status: Married    Spouse name: Not on file  . Number of children: Not on file  . Years of education: Not on file  . Highest education level: Not on file  Occupational History  . 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 Occupation: disabled  Social Needs  . Financial resource strain: Not on file  . Food insecurity:    Worry: Not on file    Inability: Not on file  . Transportation needs:    Medical: Not on file    Non-medical: Not on file  Tobacco Use  . Smoking status: Former Smoker    Last attempt to quit: 07/27/2005    Years since quitting: 12.6  . Smokeless tobacco: Never Used  Substance and Sexual Activity  . Alcohol use: Yes    Alcohol/week: 0.0 standard drinks    Comment: occasionally  . Drug use: No  . Sexual activity: Yes  Lifestyle  . Physical activity:    Days per week: Not on file    Minutes per session: Not on file  . Stress: Not on file  Relationships  . Social connections:    Talks on phone: Not on file    Gets together: Not on file    Attends religious service: Not on file    Active member of club or organization: Not on file    Attends meetings of clubs or organizations: Not on file    Relationship status: Not on file  . Intimate partner violence:    Fear of current or ex partner: Not on file    Emotionally abused: Not on file    Physically abused: Not on file    Forced sexual activity: Not on file  Other Topics Concern  . Not on file  Social History Narrative  . Not on file      Review of systems: Review of Systems  Constitutional: Negative for fever and chills.  HENT: Positive for runny nose.   Eyes: Negative for blurred vision.  Respiratory: Negative for cough, shortness of breath and wheezing.   Cardiovascular: Negative for chest  pain and palpitations.  Gastrointestinal: as per HPI Genitourinary: Negative for dysuria, urgency, frequency and  hematuria.  Musculoskeletal: Positive for myalgias, back pain and joint pain.  Skin: Negative for itching and positive for psoriasis.  Neurological: Negative for dizziness, tremors, focal weakness, seizures and loss of consciousness.  Positive for frequent headaches Endo/Heme/Allergies: Positive for seasonal allergies.  Psychiatric/Behavioral: Negative for suicidal ideas and hallucinations.  Positive for depression All other systems reviewed and are negative.   Physical Exam: Vitals:   03/28/18 1019  BP: 140/84  Pulse: 76   Body mass index is 36.24 kg/m. Gen:      No acute distress HEENT:  EOMI, sclera anicteric Neck:     No masses; no thyromegaly Lungs:    Clear to auscultation bilaterally; normal respiratory effort CV:         Regular rate and rhythm; no murmurs Abd:      + bowel sounds; soft, non-tender; no palpable masses, no distension Ext:    No edema; adequate peripheral perfusion Skin:      Warm and dry; no rash Neuro: alert and oriented x 3 Psych: normal mood and affect  Data Reviewed:  Reviewed labs, radiology imaging, old records and pertinent past GI work up   Assessment and Plan/Recommendations:  52 year old female with history of ulcerative colitis, psoriasis and psoriatic arthritis here for follow-up visit Ulcerative colitis symptoms better controlled with Morrie Sheldon 5 mg twice daily She is also on methotrexate weekly for psoriatic arthritis Continue Lialda 2.4 g daily Follow-up B12, folate, ferritin and CRP Colonoscopy May 2019 negative for active colitis, surveillance colonoscopy May 2021 Due for bone density scan, will request it History of PPD positive, chest x-ray July 2019 normal Up-to-date with vaccination Return in 6 months or sooner   Greater than 50% of the time used for counseling as well as treatment plan and follow-up. She had multiple questions which were answered to her satisfaction  K. Denzil Magnuson , MD [phone]    CC: Mayra Neer, MD

## 2018-03-28 NOTE — Patient Instructions (Addendum)
You have been scheduled for a bone density test on 04/01/2018 at 9:30am. Please arrive 15 minutes prior to your scheduled appointment to radiology on the basement floor of [place] location for this test. If you need to cancel or reschedule for any reason, please contact radiology at [phone].  Preparation for test is as follows:   If you are taking calcium, discontinue this 24-48 hours prior to your appointment.   Wear pants with an elastic waistband (or without any metal such as a zipper).   Do not wear an underwire bra.   We do have gowns if you are unable to find appropriate clothing without metal.   Please bring a list of all current medications.  Go to the basement for labs today  Follow up in 6 months  Thank you for choosing [facility]  Kavitha Nandigam,MD

## 2018-04-01 ENCOUNTER — Ambulatory Visit (INDEPENDENT_AMBULATORY_CARE_PROVIDER_SITE_OTHER)
Admission: RE | Admit: 2018-04-01 | Discharge: 2018-04-01 | Disposition: A | Discharge status: Home / Self Care | Payer: Medicare HMO | Source: Ambulatory Visit | Attending: Gastroenterology | Admitting: Gastroenterology

## 2018-04-01 DIAGNOSIS — K5190 Ulcerative colitis, unspecified, without complications: Secondary | ICD-10-CM | POA: Diagnosis not present

## 2018-04-02 ENCOUNTER — Telehealth: Payer: Self-pay | Admitting: Gastroenterology

## 2018-04-02 NOTE — Telephone Encounter (Signed)
Advised patient the results have not been reviewed by her provider. I will call back with recommendations once they have been reviewed.

## 2018-04-19 ENCOUNTER — Encounter: Payer: Self-pay | Admitting: Emergency Medicine

## 2018-04-19 ENCOUNTER — Ambulatory Visit (INDEPENDENT_AMBULATORY_CARE_PROVIDER_SITE_OTHER): Payer: Medicare HMO | Admitting: Emergency Medicine

## 2018-04-19 ENCOUNTER — Ambulatory Visit: Payer: Medicare HMO | Admitting: Emergency Medicine

## 2018-04-19 VITALS — BP 140/98 | HR 90 | Ht 66.25 in | Wt 224.0 lb

## 2018-04-19 DIAGNOSIS — J301 Allergic rhinitis due to pollen: Secondary | ICD-10-CM

## 2018-04-19 DIAGNOSIS — R400 Somnolence: Secondary | ICD-10-CM

## 2018-04-19 DIAGNOSIS — R0683 Snoring: Secondary | ICD-10-CM

## 2018-04-19 DIAGNOSIS — J449 Chronic obstructive pulmonary disease, unspecified: Secondary | ICD-10-CM

## 2018-04-19 DIAGNOSIS — G4733 Obstructive sleep apnea (adult) (pediatric): Secondary | ICD-10-CM | POA: Insufficient documentation

## 2018-04-19 DIAGNOSIS — J309 Allergic rhinitis, unspecified: Secondary | ICD-10-CM | POA: Insufficient documentation

## 2018-04-19 DIAGNOSIS — R0602 Shortness of breath: Secondary | ICD-10-CM | POA: Diagnosis not present

## 2018-04-19 LAB — PULMONARY FUNCTION TEST
DL/VA % pred: 89 %
DL/VA: 4.51 ml/min/mmHg/L
DLCO unc % pred: 83 %
DLCO unc: 22.71 ml/min/mmHg
FEF 25-75 Post: 1.8 L/sec
FEF 25-75 Pre: 1.58 L/sec
FEF2575-%Change-Post: 13 %
FEF2575-%Pred-Post: 64 %
FEF2575-%Pred-Pre: 56 %
FEV1-%Change-Post: 4 %
FEV1-%Pred-Post: 75 %
FEV1-%Pred-Pre: 72 %
FEV1-Post: 2.22 L
FEV1-Pre: 2.13 L
FEV1FVC-%Change-Post: 0 %
FEV1FVC-%Pred-Pre: 92 %
FEV6-%Change-Post: 3 %
FEV6-%Pred-Post: 81 %
FEV6-%Pred-Pre: 78 %
FEV6-Post: 3 L
FEV6-Pre: 2.88 L
FEV6FVC-%Change-Post: 0 %
FEV6FVC-%Pred-Post: 102 %
FEV6FVC-%Pred-Pre: 103 %
FVC-%Change-Post: 4 %
FVC-%Pred-Post: 80 %
FVC-%Pred-Pre: 76 %
FVC-Post: 3.03 L
FVC-Pre: 2.89 L
Post FEV1/FVC ratio: 73 %
Post FEV6/FVC ratio: 100 %
Pre FEV1/FVC ratio: 74 %
Pre FEV6/FVC Ratio: 100 %
RV % pred: 129 %
RV: 2.53 L
TLC % pred: 101 %
TLC: 5.46 L

## 2018-04-19 NOTE — Assessment & Plan Note (Signed)
She has snoring, daytime sleepiness, morning headache.  I think there is a moderate to high suspicion for obstructive sleep apnea.  We will perform a split-night sleep study to better evaluate.

## 2018-04-19 NOTE — Progress Notes (Signed)
PFT done today. 

## 2018-04-19 NOTE — Assessment & Plan Note (Signed)
COPD/asthma that was initially recognized with an acute exacerbation in June.  Her pulmonary function testing confirms moderate obstruction without a bronchodilator response.  We talked about whether we should start every day maintenance therapy.  For now we are going to just continue albuterol as needed, reassess depending on her use, clinical symptoms.  Flu shot today.

## 2018-04-19 NOTE — Assessment & Plan Note (Signed)
Continue current regimen of Xyzal and Singulair

## 2018-04-19 NOTE — Patient Instructions (Addendum)
Please keep albuterol available to use 2 puffs up to every 4 hours if you needed for shortness of breath, chest tightness, wheezing. We will not start an every day scheduled inhaler at this time.  We will reconsider as we go forward depending on your symptoms and albuterol use. Flu shot up-to-date Please continue your Xyzal and Singulair as you have been taking them. We will arrange for a split-night sleep study to evaluate for possible sleep apnea. Follow with Dr Lamonte Sakai in 2 months to review your sleep study together.

## 2018-04-19 NOTE — Progress Notes (Signed)
Subjective:    Patient ID: [name], female    DOB: [birth year], 52 y.o.   MRN: [id number]  HPI 52 year old former smoker (25 pack years) with a history of ulcerative colitis (pancolitis), psoriatic arthritis on methotrexate.  She is also been on several different biologic therapies, currently using Xeljanz.  She has had allergy testing in May 2019, a lot of sneezing, rhinitis. She is referred here today for wheezing and bronchitic symptoms.   She had URI sx in June that led to cough that persisted. Treated with abx and pred x 3. Also developed some wheeze - responded to BD in PCP office. She was given Symbicort in May. Cough is now better, not using any albuterol right now. She can have dyspnea w exertion, has noticed for about 6 months.   CXR 02/08/18 reviewed and looks normal.   ROV 04/19/18 --52 year old former smoker with ulcerative colitis, psoriatic arthritis, methotrexate and Xeljanz use.  I saw her after an episode of wheezing and dyspnea, cough following an upper respiratory infection.  Symptoms persisted for an extended period of time.  She ended up on bronchodilators as above, also on Xyzal, Singulair for allergic rhinitis.  Pulmonary function testing was done today which is consistent with mixed obstruction and restriction without a bronchodilator response.  Her lung volumes are normal and her DLCO is normal.  FEV1 2.13 L (72% predicted).  We stopped her Symbicort last time and she reports that she didn't really miss it. She still has exertional SOB with activity, sometimes wheeze as well. Rare albuterol use.  She reports daytime somnolence, snoring, morning headache.   Review of Systems  Constitutional: Negative for fever and unexpected weight change.  HENT: Positive for congestion, ear pain, postnasal drip, sinus pressure and sneezing. Negative for dental problem, nosebleeds, rhinorrhea and trouble swallowing.   Eyes: Negative for redness and itching.  Respiratory: Positive for  cough, shortness of breath and wheezing. Negative for chest tightness.   Cardiovascular: Negative for palpitations and leg swelling.  Gastrointestinal: Negative for nausea and vomiting.  Genitourinary: Negative for dysuria.  Musculoskeletal: Negative for joint swelling.  Skin: Negative for rash.  Allergic/Immunologic: Negative.  Negative for environmental allergies, food allergies and immunocompromised state.  Neurological: Negative for headaches.  Hematological: Does not bruise/bleed easily.  Psychiatric/Behavioral: Negative for dysphoric mood. The patient is not nervous/anxious.     Past Medical History:  Diagnosis Date  . Allergy   . Anxiety   . Arthritis   . Asthma   . Cataract    both eyes  surgically repaired  . Depression    treated  . Dyspnea   . Fibrocystic breast disease   . GERD (gastroesophageal reflux disease)   . History of hiatal hernia    repaired  . History of kidney stones    questionable  . Hyperlipidemia   . Hypertension   . Hypothyroidism   . Irritable bowel syndrome   . Ovarian cyst   . Pancreatitis   . PONV (postoperative nausea and vomiting)    NO TROUBLE WITH CATARACT SURGERY  . Psoriasis   . Psoriatic arthritis ([place]) 05/12/2016   Poor response to Humira Inadequate response to Enbrel Inadequate response to Simponi  . Tachycardia   . Ulcerative colitis      Family History  Problem Relation Age of Onset  . Breast cancer Mother 12  . Heart disease Mother   . Colon polyps Mother   . Other Mother  pre-cancerous tumor removed  . Colon cancer Mother   . Heart disease Father   . Crohn's disease Sister   . Alcoholism Sister   . Irritable bowel syndrome Unknown   . Crohn's disease Daughter   . Breast cancer Paternal Aunt   . Breast cancer Maternal Grandmother   . Esophageal cancer Neg Hx   . Rectal cancer Neg Hx   . Stomach cancer Neg Hx   . Pancreatic cancer Neg Hx      Social History   Socioeconomic History  . Marital status:  Married    Spouse name: Not on file  . Number of children: Not on file  . Years of education: Not on file  . Highest education level: Not on file  Occupational History  . 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 Occupation: disabled  Social Needs  . Financial resource strain: Not on file  . Food insecurity:    Worry: Not on file    Inability: Not on file  . Transportation needs:    Medical: Not on file    Non-medical: Not on file  Tobacco Use  . Smoking status: Former Smoker    Last attempt to quit: 07/27/2005    Years since quitting: 12.7  . Smokeless tobacco: Never Used  Substance and Sexual Activity  . Alcohol use: Yes    Alcohol/week: 0.0 standard drinks    Comment: occasionally  . Drug use: No  . Sexual activity: Yes  Lifestyle  . Physical activity:    Days per week: Not on file    Minutes per session: Not on file  . Stress: Not on file  Relationships  . Social connections:    Talks on phone: Not on file    Gets together: Not on file    Attends religious service: Not on file    Active member of club or organization: Not on file    Attends meetings of clubs or organizations: Not on file    Relationship status: Not on file  . Intimate partner violence:    Fear of current or ex partner: Not on file    Emotionally abused: Not on file    Physically abused: Not on file    Forced sexual activity: Not on file  Other Topics Concern  . Not on file  Social History Narrative  . Not on file     Allergies  Allergen Reactions  . Imuran [Azathioprine] Other (See Comments)    Pancreatitis  . Meperidine Hcl Hives  . Remicade [Infliximab] Hives and Other (See Comments)    Redness  . Sulfasalazine Swelling and Hives  . Avelox [Moxifloxacin] Rash  . Doxycycline Rash  . Erythromycin Rash  . Latex Rash     Outpatient Medications Prior to Visit  Medication Sig Dispense Refill  . aspirin-acetaminophen-caffeine (EXCEDRIN MIGRAINE)  250-250-65 MG tablet Take 2 tablets by mouth every 8 (eight) hours as needed for headache.     Marland Kitchen azelastine (OPTIVAR) 0.05 % ophthalmic solution Place 1 drop into both eyes 2 (two) times daily as needed (for allergies).     . Calcium Carbonate-Vit D-Min (CALTRATE 600+D PLUS PO) Take 1 tablet by mouth daily.    . clobetasol ointment (TEMOVATE) 7.35 % Apply 1 application topically 2 (two) times daily. Use for 4-7 days and wait 1 week before reusing (Patient taking differently: Apply 1 application topically 2 (two) times daily as needed (for psoriasis). Use for 4-7 days and wait  1 week before reusing) 30 g 1  . dicyclomine (BENTYL) 20 MG tablet Take 1 tablet (20 mg total) by mouth 2 (two) times daily. (Patient taking differently: Take 20 mg by mouth as needed. ) 20 tablet 0  . estradiol (ESTRACE) 1 MG tablet Take 1 mg by mouth daily.    . fluticasone (FLONASE) 50 MCG/ACT nasal spray Place 1 spray into both nostrils daily.     . folic acid (FOLVITE) 1 MG tablet Take 2 mg by mouth daily.    Marland Kitchen ibuprofen (ADVIL,MOTRIN) 200 MG tablet Take 400 mg by mouth every 6 (six) hours as needed for mild pain.     Marland Kitchen levocetirizine (XYZAL) 5 MG tablet Take 5 mg by mouth every evening.    Marland Kitchen levothyroxine (SYNTHROID, LEVOTHROID) 125 MCG tablet Take 125 mcg by mouth every other day.     . levothyroxine (SYNTHROID, LEVOTHROID) 137 MCG tablet Take 137 mcg by mouth every other day.    . methotrexate 50 MG/2ML injection INJECT 1 ML SUBCUTANEOUSLY  WEEKLY 12 mL 0  . montelukast (SINGULAIR) 10 MG tablet Take 10 mg by mouth at bedtime.    . Multiple Vitamins-Minerals (CENTRUM SILVER ULTRA WOMENS PO) Take 1 tablet by mouth daily.    Marland Kitchen omeprazole (PRILOSEC) 20 MG capsule TAKE 1 CAPSULE EVERY DAY BEFORE BREAKFAST 90 capsule 3  . ondansetron (ZOFRAN) 4 MG tablet Take 1 tablet (4 mg total) by mouth every 8 (eight) hours as needed for nausea or vomiting. 30 tablet 5  . rosuvastatin (CRESTOR) 20 MG tablet Take 20 mg by mouth daily.      . sertraline (ZOLOFT) 100 MG tablet Take 100 mg by mouth daily.    . Tofacitinib Citrate (XELJANZ PO) Take 5 mg by mouth 2 (two) times daily.    . Tuberculin-Allergy Syringes 27G X 1/2" 1 ML KIT Inject 1 Syringe into the skin once a week. To be used with weekly methotrexate injections 12 each 1  . VENTOLIN HFA 108 (90 Base) MCG/ACT inhaler Inhale 2 puffs into the lungs every 6 (six) hours as needed for wheezing or shortness of breath.      Facility-Administered Medications Prior to Visit  Medication Dose Route Frequency Provider Last Rate Last Dose  . 0.9 %  sodium chloride infusion  500 mL Intravenous Once Mauri Pole, MD            Objective:   Physical Exam Vitals:   04/19/18 1142  BP: (!) 140/98  Pulse: 90  SpO2: 96%  Weight: 224 lb (101.6 kg)  Height: 5' 6.25" (1.683 m)   Gen: Pleasant, overwt woman, in no distress,  normal affect  ENT: No lesions,  mouth clear,  oropharynx clear, no postnasal drip  Neck: No JVD, no stridor  Lungs: No use of accessory muscles, no wheeze, decreased at bases  Cardiovascular: RRR, heart sounds normal, no murmur or gallops, no peripheral edema  Musculoskeletal: No deformities, no cyanosis or clubbing  Neuro: alert, non focal  Skin: Warm, no lesions or rash      Assessment & Plan:  COPD (chronic obstructive pulmonary disease) (HCC) COPD/asthma that was initially recognized with an acute exacerbation in June.  Her pulmonary function testing confirms moderate obstruction without a bronchodilator response.  We talked about whether we should start every day maintenance therapy.  For now we are going to just continue albuterol as needed, reassess depending on her use, clinical symptoms.  Flu shot today.  Allergic rhinitis Continue current regimen of  Xyzal and Singulair  Snoring She has snoring, daytime sleepiness, morning headache.  I think there is a moderate to high suspicion for obstructive sleep apnea.  We will perform a  split-night sleep study to better evaluate.  Baltazar Apo, MD, PhD 04/19/2018, 11:59 AM [facility] [phone] or if no answer [phone]

## 2018-04-23 NOTE — Progress Notes (Signed)
Office Visit Note  Patient: [name]             Date of Birth: May 30, 1966           MRN: [id number]             PCP: Mayra Neer, MD Referring: Mayra Neer, MD Visit Date: 05/07/2018 Occupation: @GUAROCC @  Subjective:  Left great toe pain   History of Present Illness: [name] is a 52 y.o. female with history of psoriatic arthritis, osteoarthritis, and DDD.  Patient is on Xeljanz 5 mg BID and methotrexate 1 ml sq once weekly.  She is off of [place].  She has noticed improvement since switching from [place] to [place].  She is tolerating Morrie Sheldon and has not experienced any side effects.  She states her last ulcerative colitis flare was in July.  She was evaluated in the emergency at the room at that time and was started on prednisone.  She has no abdominal pain at this time.  She reports that overall her joint pain has improved.  With the psoriasis on the plantar aspect of bilateral feet is also started to improve.  She denies any plantar fasciitis.  She has very mild left SI joint pain.  She had injection performed on 12/06/2017 which is provided significant and lasting relief.  She continues to have pain in bilateral knee joints.  She is also some discomfort in her left wrist.  Over the past 1 week she has had severe pain in her left great toe.  She states she is noticed mild redness and swelling especially at night.  She denies a history of gout.  She is also having left Achilles tendinitis.   Activities of Daily Living:  Patient reports morning stiffness for 30 minutes.   Patient Denies nocturnal pain.  Difficulty dressing/grooming: Denies Difficulty climbing stairs: Reports Difficulty getting out of chair: Denies Difficulty using hands for taps, buttons, cutlery, and/or writing: Denies  Review of Systems  Constitutional: Negative for fatigue.  HENT: Negative for mouth sores, mouth dryness and nose dryness.   Eyes: Negative for pain, visual disturbance and dryness.   Respiratory: Negative for cough, hemoptysis, shortness of breath and difficulty breathing.   Cardiovascular: Negative for chest pain, palpitations, hypertension and swelling in legs/feet.  Gastrointestinal: Negative for blood in stool, constipation and diarrhea.  Endocrine: Negative for increased urination.  Genitourinary: Negative for difficulty urinating and painful urination.  Musculoskeletal: Positive for arthralgias, joint pain, joint swelling and morning stiffness. Negative for myalgias, muscle weakness, muscle tenderness and myalgias.  Skin: Positive for rash and hair loss. Negative for color change, pallor, nodules/bumps, skin tightness, ulcers and sensitivity to sunlight.  Allergic/Immunologic: Negative for susceptible to infections.  Neurological: Positive for headaches. Negative for dizziness, numbness and weakness.  Hematological: Negative for swollen glands.  Psychiatric/Behavioral: Negative for depressed mood and sleep disturbance. The patient is not nervous/anxious.     PMFS History:  Patient Active Problem List   Diagnosis Date Noted  . Allergic rhinitis 04/19/2018  . Snoring 04/19/2018  . COPD (chronic obstructive pulmonary disease) ([place]) 03/26/2018  . Rectal bleeding 01/22/2018  . LLQ abdominal pain 01/22/2018  . Dysphagia 01/12/2017  . 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 Primary osteoarthritis of both knees 08/11/2016  . History of hypertension 08/11/2016  . History of ulcerative colitis 08/11/2016  . History of IBS 08/11/2016  . 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 High risk medications (not anticoagulants) long-term use 05/13/2016  .  Knee pain, bilateral 05/13/2016  . Anserine bursitis 05/13/2016  . 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 Chondromalacia patellae, right knee 05/13/2016  . Psoriatic arthritis ([place]) 05/12/2016  . UNSPECIFIED HYPOTHYROIDISM 08/24/2009  . HYPERLIPIDEMIA 08/24/2009  . Essential  hypertension 08/24/2009  . GERD 08/24/2009  . Ulcerative colitis ([place]) 08/24/2009  . IRRITABLE BOWEL SYNDROME 08/24/2009  . OVARIAN CYST 08/24/2009  . PSORIASIS 08/24/2009  . ARTHRITIS 08/24/2009  . TACHYCARDIA, HX OF 08/24/2009  . PANCREATITIS, HX OF 08/24/2009  . FIBROCYSTIC BREAST DISEASE, HX OF 08/24/2009    Past Medical History:  Diagnosis Date  . Allergy   . Anxiety   . Arthritis   . Asthma   . Cataract    both eyes  surgically repaired  . Depression    treated  . Dyspnea   . Fibrocystic breast disease   . GERD (gastroesophageal reflux disease)   . History of hiatal hernia    repaired  . History of kidney stones    questionable  . Hyperlipidemia   . Hypertension   . Hypothyroidism   . Irritable bowel syndrome   . Ovarian cyst   . Pancreatitis   . PONV (postoperative nausea and vomiting)    NO TROUBLE WITH CATARACT SURGERY  . Psoriasis   . Psoriatic arthritis ([place]) 05/12/2016   Poor response to Humira Inadequate response to Enbrel Inadequate response to Simponi  . Tachycardia   . Ulcerative colitis     Family History  Problem Relation Age of Onset  . Breast cancer Mother 68  . Heart disease Mother   . Colon polyps Mother   . Other Mother        pre-cancerous tumor removed  . Colon cancer Mother   . Heart disease Father   . Crohn's disease Sister   . Alcoholism Sister   . Irritable bowel syndrome Unknown   . Crohn's disease Daughter   . Breast cancer Paternal Aunt   . Breast cancer Maternal Grandmother   . Esophageal cancer Neg Hx   . Rectal cancer Neg Hx   . Stomach cancer Neg Hx   . Pancreatic cancer Neg Hx    Past Surgical History:  Procedure Laterality Date  . ABDOMINAL HYSTERECTOMY    . ANKLE RECONSTRUCTION     left  . bladder tuck    . CATARACT EXTRACTION W/PHACO Right 09/11/2017   Procedure: CATARACT EXTRACTION PHACO AND INTRAOCULAR LENS PLACEMENT (IOC);  Surgeon: Birder Robson, MD;  Location: [facility] ORS;  Service: Ophthalmology;   Laterality: Right;  [place] 00:18.5 AP% 6.9 CDE 1.29 Fluid Pack Lot # [id number]  . CATARACT EXTRACTION W/PHACO Left 10/02/2017   Procedure: CATARACT EXTRACTION PHACO AND INTRAOCULAR LENS PLACEMENT ([place]);  Surgeon: Birder Robson, MD;  Location: [facility] ORS;  Service: Ophthalmology;  Laterality: Left;  [place] 00:25.9 AP% 6.9 CDE 1.80 Fluid Pack Lot # [id number]  . COLONOSCOPY    . EYE SURGERY     Lasik  . FRACTURE SURGERY    . KNEE SURGERY     left  . LAPAROSCOPIC NISSEN FUNDOPLICATION N/A 0/45/4098   Procedure: LAPAROSCOPIC NISSEN TAKEDOWN AND UPPER ENDOSCOPY;  Surgeon: Johnathan Hausen, MD;  Location: [facility];  Service: General;  Laterality: N/A;  . NASAL SEPTUM SURGERY    . NISSEN FUNDOPLICATION    . PARTIAL HYSTERECTOMY    . POLYPECTOMY    . repair cystocele and rectocele    . thyroid ablation    . TUBAL LIGATION    . UPPER GASTROINTESTINAL ENDOSCOPY    .  UPPER GI ENDOSCOPY  01/12/2017   Procedure: UPPER GI ENDOSCOPY;  Surgeon: Johnathan Hausen, MD;  Location: [facility];  Service: General;;   Social History   Social History Narrative  . Not on file    Objective: Vital Signs: BP (!) 151/94 (BP Location: Left Arm, Patient Position: Sitting, Cuff Size: Normal)   Pulse 84   Ht 5' 6"  (1.676 m)   Wt 219 lb 6.4 oz (99.5 kg)   BMI 35.41 kg/m    Physical Exam  Constitutional: She is oriented to person, place, and time. She appears well-developed and well-nourished.  HENT:  Head: Normocephalic and atraumatic.  Eyes: Conjunctivae and EOM are normal.  Neck: Normal range of motion.  Cardiovascular: Normal rate, regular rhythm, normal heart sounds and intact distal pulses.  Pulmonary/Chest: Effort normal and breath sounds normal.  Abdominal: Soft. Bowel sounds are normal.  Lymphadenopathy:    She has no cervical adenopathy.  Neurological: She is alert and oriented to person, place, and time.  Skin: Skin is warm and dry. Capillary refill takes less than 2 seconds.  Psoriasis on the plantar  aspect of bilateral feet.  Psychiatric: She has a normal mood and affect. Her behavior is normal.  Nursing note and vitals reviewed.    Musculoskeletal Exam: C-spine, thoracic spine, lumbar spine good range of motion.  No midline spinal tenderness.  Mild left SI joint tenderness.  Shoulder joints, elbow joints, wrist joints, MCPs, PIPs, DIPs good range of motion no synovitis.  Subluxation of the left first PIP joint.  PIP and DIP synovial thickening.  Full range of motion of bilateral hip joints.  She has discomfort with left hip range of motion.  Knee joints, ankle joints, MTPs, PIPs, DIPs good range of motion.  She has swelling, erythema, and tenderness of the left first MTP joint.  No warmth or effusion bilateral knee joints.  No tenderness or swelling of ankle joints.  She has tenderness of the left Achilles tendon.  No plantar fasciitis.  CDAI Exam: CDAI Score: 0.6  Patient Global Assessment: 3 (mm); Provider Global Assessment: 3 (mm) Swollen: 0 ; Tender: 0  Joint Exam   Not documented   There is currently no information documented on the homunculus. Go to the Rheumatology activity and complete the homunculus joint exam.  Investigation: No additional findings.  Imaging: No results found.  Recent Labs: Lab Results  Component Value Date   WBC 12.7 (H) 01/20/2018   HGB 13.7 01/20/2018   PLT 355 01/20/2018   NA 142 01/20/2018   K 4.4 01/20/2018   CL 107 01/20/2018   CO2 25 01/20/2018   GLUCOSE 113 (H) 01/20/2018   BUN 16 01/20/2018   CREATININE 0.84 01/20/2018   BILITOT 0.4 01/20/2018   ALKPHOS 68 01/20/2018   AST 24 01/20/2018   ALT 21 01/20/2018   PROT 6.8 02/08/2018   ALBUMIN 3.8 01/20/2018   CALCIUM 9.1 01/20/2018   GFRAA >60 01/20/2018    Speciality Comments: No specialty comments available.  Procedures:  No procedures performed Allergies: Imuran [azathioprine]; Meperidine hcl; Remicade [infliximab]; Sulfasalazine; Avelox [moxifloxacin]; Doxycycline;  Erythromycin; and Latex   Assessment / Plan:     Visit Diagnoses: Psoriatic arthropathy ([place]): She has no synovitis or dactylitis. She has mild left SI joint tenderness on exam.   She had a cortisone injection performed on 12/06/2017 which provided significant and lasting relief. She has no plantar fasciitis at this time. She feels as though her stress has improved since switching from [place] to  [place].  She presents today with left great toe pain as well as left Achilles tendinitis.  She has had severe left great toe pain for the past 1 week.  We will check a uric acid level today.  She has noticed significant improvement since starting on Xeljanz 5 mg by mouth twice daily and continue on methotrexate 1 mL subcutaneously once weekly. Her last ulcerative colitis flare was in July 2019.  We called the pharmacy to cancel the [facility] prescription since she is no longer taking it.  She will continue on Xeljanz 5 mg by mouth twice daily and methotrexate 1 mL subcutaneously once weekly.  She does not need any refills at this time.  She was advised to notify us if she develops increased joint pain or joint swelling.  She will follow-up in the office in 5 months.  Psoriasis: She has psoriasis on the plantar aspect of bilateral feet.  She feels as though her psoriasis has been improving since switching from [place] to [place].  High risk medication use - Xeljanz 5 mg by mouth BID and MTX 1 ml sq once weekly.  CBC and CMP will be drawn today to monitor for drug toxicity.  We also check a lipid panel.  She previously had a positive TB test.  She gets yearly chest x-rays.  She is due for her next chest x-ray in July 2020.- Plan: CBC with Differential/Platelet, COMPLETE METABOLIC PANEL WITH GFR, Lipid panel,  DDD (degenerative disc disease), lumbar: No midline spinal tenderness.  Good range of motion.  Primary osteoarthritis of both knees: No warmth or effusion.  She is good range of motion with no  discomfort.  Spondylosis of lumbar region without myelopathy or radiculopathy: No midline spinal tenderness.  No symptoms of radiculopathy at this time.  History of ulcerative colitis: Her last ulcerative colitis flare was in July.  She was on several prednisone tapers at that time.  She has no abdominal pain or tenderness at this time.  She is on Xeljanz 5 mg by mouth twice daily.  History of hypertension: She is advised to make an appointment with her PCP to discuss  elevated blood pressure.  Chronic left SI joint pain: She has mild left SI joint tenderness.  She had a cortisone injection performed on 12/06/2017 which provided significant and lasting relief.  Ischial bursitis of left side: Resolved.  Vitamin D deficiency: She is on a calcium and vitamin D supplement.  Pain of left great toe -she has had severe left great toe pain for the past 1 week.  Erythema and swelling was noted on exam.  She has significant tenderness to palpation.  She has developed issues as this week due to the discomfort.  We will check a uric acid level today.  We discussed the association between psoriatic arthritis and gout.  Plan: Uric acid   Other medical conditions are listed as follows:   History of IBS  History of gastroesophageal reflux (GERD)  Orders: Orders Placed This Encounter  Procedures  . CBC with Differential/Platelet  . COMPLETE METABOLIC PANEL WITH GFR  . Uric acid  . Lipid panel   No orders of the defined types were placed in this encounter.   Face-to-face time spent with patient was 30 minutes. Greater than 50% of time was spent in counseling and coordination of care.  Follow-Up Instructions: Return in about 5 months (around 10/06/2018) for Psoriatic arthritis.   Ofilia Neas, PA-C   I examined and evaluated the patient with  Hazel Sams PA.  Patient had no synovitis on examination.  Her psoriasis is much improved.  She has a still some rash on the plantar surface of her feet.   She had warmth and swelling in her left first MTP on my exam.  It raises the concern of possible gout.  We will check uric acid level today.  The plan of care was discussed as noted above.  Bo Merino, MD  Note - This record has been created using Editor, commissioning.  Chart creation errors have been sought, but may not always  have been located. Such creation errors do not reflect on  the standard of medical care.

## 2018-05-04 ENCOUNTER — Other Ambulatory Visit: Payer: Self-pay | Admitting: Rheumatology

## 2018-05-06 NOTE — Telephone Encounter (Signed)
Last Visit: 12/06/17 Next visit: 05/07/18   Okay to refill per Dr. Estanislado Pandy

## 2018-05-06 NOTE — Telephone Encounter (Signed)
Last Visit: 12/06/17 Next visit: 05/07/18 Labs: 01/20/18 Elevated WBC 12.7 Elevated glucose    Okay to refill  30 day supply per Dr. Estanislado Pandy

## 2018-05-07 ENCOUNTER — Ambulatory Visit: Payer: Medicare HMO | Admitting: Rheumatology

## 2018-05-07 ENCOUNTER — Encounter: Payer: Self-pay | Admitting: Physician Assistant

## 2018-05-07 VITALS — BP 151/94 | HR 84 | Ht 66.0 in | Wt 219.4 lb

## 2018-05-07 DIAGNOSIS — Z8719 Personal history of other diseases of the digestive system: Secondary | ICD-10-CM | POA: Diagnosis not present

## 2018-05-07 DIAGNOSIS — E559 Vitamin D deficiency, unspecified: Secondary | ICD-10-CM

## 2018-05-07 DIAGNOSIS — L409 Psoriasis, unspecified: Secondary | ICD-10-CM | POA: Diagnosis not present

## 2018-05-07 DIAGNOSIS — M47816 Spondylosis without myelopathy or radiculopathy, lumbar region: Secondary | ICD-10-CM

## 2018-05-07 DIAGNOSIS — M170 Bilateral primary osteoarthritis of knee: Secondary | ICD-10-CM

## 2018-05-07 DIAGNOSIS — M7072 Other bursitis of hip, left hip: Secondary | ICD-10-CM

## 2018-05-07 DIAGNOSIS — M533 Sacrococcygeal disorders, not elsewhere classified: Secondary | ICD-10-CM | POA: Diagnosis not present

## 2018-05-07 DIAGNOSIS — M5136 Other intervertebral disc degeneration, lumbar region: Secondary | ICD-10-CM

## 2018-05-07 DIAGNOSIS — M79675 Pain in left toe(s): Secondary | ICD-10-CM

## 2018-05-07 DIAGNOSIS — L4050 Arthropathic psoriasis, unspecified: Secondary | ICD-10-CM

## 2018-05-07 DIAGNOSIS — G8929 Other chronic pain: Secondary | ICD-10-CM

## 2018-05-07 DIAGNOSIS — Z8679 Personal history of other diseases of the circulatory system: Secondary | ICD-10-CM | POA: Diagnosis not present

## 2018-05-07 DIAGNOSIS — Z79899 Other long term (current) drug therapy: Secondary | ICD-10-CM

## 2018-05-07 DIAGNOSIS — E039 Hypothyroidism, unspecified: Secondary | ICD-10-CM | POA: Diagnosis not present

## 2018-05-07 LAB — COMPLETE METABOLIC PANEL WITH GFR
AG Ratio: 1.5 (calc) (ref 1.0–2.5)
ALT: 28 U/L (ref 6–29)
AST: 29 U/L (ref 10–35)
Albumin: 4.5 g/dL (ref 3.6–5.1)
Alkaline phosphatase (APISO): 57 U/L (ref 33–130)
BUN: 15 mg/dL (ref 7–25)
CO2: 28 mmol/L (ref 20–32)
Calcium: 9.9 mg/dL (ref 8.6–10.4)
Chloride: 100 mmol/L (ref 98–110)
Creat: 0.78 mg/dL (ref 0.50–1.05)
GFR, Est African American: 101 mL/min/{1.73_m2} (ref >=60)
GFR, Est Non African American: 87 mL/min/{1.73_m2} (ref >=60)
Globulin: 3 g/dL (calc) (ref 1.9–3.7)
Glucose, Bld: 90 mg/dL (ref 65–99)
Potassium: 4.6 mmol/L (ref 3.5–5.3)
Sodium: 139 mmol/L (ref 135–146)
Total Bilirubin: 0.4 mg/dL (ref 0.2–1.2)
Total Protein: 7.5 g/dL (ref 6.1–8.1)

## 2018-05-07 LAB — CBC WITH DIFFERENTIAL/PLATELET
Basophils Absolute: 27 cells/uL (ref 0–200)
Basophils Relative: 0.4 %
Eosinophils Absolute: 80 cells/uL (ref 15–500)
Eosinophils Relative: 1.2 %
HCT: 39.1 % (ref 35.0–45.0)
Hemoglobin: 13.5 g/dL (ref 11.7–15.5)
Lymphs Abs: 2372 cells/uL (ref 850–3900)
MCH: 31.5 pg (ref 27.0–33.0)
MCHC: 34.5 g/dL (ref 32.0–36.0)
MCV: 91.1 fL (ref 80.0–100.0)
MPV: 10.5 fL (ref 7.5–12.5)
Monocytes Relative: 7.9 %
Neutro Abs: 3692 cells/uL (ref 1500–7800)
Neutrophils Relative %: 55.1 %
Platelets: 321 10*3/uL (ref 140–400)
RBC: 4.29 10*6/uL (ref 3.80–5.10)
RDW: 13.5 % (ref 11.0–15.0)
Total Lymphocyte: 35.4 %
WBC mixed population: 529 cells/uL (ref 200–950)
WBC: 6.7 10*3/uL (ref 3.8–10.8)

## 2018-05-07 LAB — LIPID PANEL
Cholesterol: 153 mg/dL (ref <200)
HDL: 61 mg/dL (ref >50)
LDL Cholesterol (Calc): 75 mg/dL (calc)
Non-HDL Cholesterol (Calc): 92 mg/dL (calc) (ref <130)
Total CHOL/HDL Ratio: 2.5 (calc) (ref <5.0)
Triglycerides: 90 mg/dL (ref <150)

## 2018-05-07 LAB — URIC ACID: Uric Acid, Serum: 5.1 mg/dL (ref 2.5–7.0)

## 2018-05-07 MED ORDER — TOFACITINIB CITRATE 5 MG PO TABS: 5.0000 mg | ORAL_TABLET | Freq: Two times a day (BID) | ORAL | Status: DC

## 2018-05-07 NOTE — Patient Instructions (Signed)
Standing Labs We placed an order today for your standing lab work.    Please come back and get your standing labs in January and every 3 months   We have open lab Monday through Friday from 8:30-11:30 AM and 1:30-4:00 PM  at the office of Dr. Shaili Deveshwar.   You may experience shorter wait times on Monday and Friday afternoons. The office is located at [address], [address], [place], [place] [address] No appointment is necessary.   Labs are drawn by [facility].  You may receive a bill from [facility] for your lab work. If you have any questions regarding directions or hours of operation,  please call [phone].   Just as a reminder please drink plenty of water prior to coming for your lab work. Thanks!   

## 2018-05-10 ENCOUNTER — Telehealth: Payer: Self-pay | Admitting: Rheumatology

## 2018-05-10 NOTE — Telephone Encounter (Signed)
Patient advised her lab results are normal. Patient verbalized understanding.

## 2018-05-10 NOTE — Telephone Encounter (Signed)
Patient left a voicemail requesting a return call regarding her labwork results.

## 2018-05-12 ENCOUNTER — Other Ambulatory Visit: Payer: Self-pay

## 2018-05-12 ENCOUNTER — Encounter: Payer: Self-pay | Admitting: Rheumatology

## 2018-05-12 ENCOUNTER — Emergency Department: Payer: Medicare HMO

## 2018-05-12 ENCOUNTER — Emergency Department
Admission: EM | Admit: 2018-05-12 | Discharge: 2018-05-12 | Disposition: A | Discharge status: Home / Self Care | Payer: Medicare HMO | Attending: Emergency Medicine | Admitting: Emergency Medicine

## 2018-05-12 DIAGNOSIS — S0990XA Unspecified injury of head, initial encounter: Secondary | ICD-10-CM | POA: Diagnosis not present

## 2018-05-12 DIAGNOSIS — Y9301 Activity, walking, marching and hiking: Secondary | ICD-10-CM | POA: Diagnosis not present

## 2018-05-12 DIAGNOSIS — S022XXA Fracture of nasal bones, initial encounter for closed fracture: Secondary | ICD-10-CM | POA: Insufficient documentation

## 2018-05-12 DIAGNOSIS — W0110XA Fall on same level from slipping, tripping and stumbling with subsequent striking against unspecified object, initial encounter: Secondary | ICD-10-CM | POA: Diagnosis not present

## 2018-05-12 DIAGNOSIS — E039 Hypothyroidism, unspecified: Secondary | ICD-10-CM | POA: Diagnosis not present

## 2018-05-12 DIAGNOSIS — Z1231 Encounter for screening mammogram for malignant neoplasm of breast: Secondary | ICD-10-CM

## 2018-05-12 DIAGNOSIS — Y92014 Private driveway to single-family (private) house as the place of occurrence of the external cause: Secondary | ICD-10-CM | POA: Diagnosis not present

## 2018-05-12 DIAGNOSIS — I10 Essential (primary) hypertension: Secondary | ICD-10-CM | POA: Insufficient documentation

## 2018-05-12 DIAGNOSIS — Z79899 Other long term (current) drug therapy: Secondary | ICD-10-CM | POA: Diagnosis not present

## 2018-05-12 DIAGNOSIS — Y999 Unspecified external cause status: Secondary | ICD-10-CM | POA: Insufficient documentation

## 2018-05-12 DIAGNOSIS — Z91040 Latex allergy status: Secondary | ICD-10-CM | POA: Insufficient documentation

## 2018-05-12 DIAGNOSIS — S0003XA Contusion of scalp, initial encounter: Secondary | ICD-10-CM | POA: Diagnosis not present

## 2018-05-12 DIAGNOSIS — J449 Chronic obstructive pulmonary disease, unspecified: Secondary | ICD-10-CM | POA: Insufficient documentation

## 2018-05-12 DIAGNOSIS — S0993XA Unspecified injury of face, initial encounter: Secondary | ICD-10-CM | POA: Diagnosis not present

## 2018-05-12 DIAGNOSIS — S0992XA Unspecified injury of nose, initial encounter: Secondary | ICD-10-CM | POA: Diagnosis not present

## 2018-05-12 NOTE — Discharge Instructions (Addendum)
Follow-up with an ENT in 1 week.  You can use ice to help decrease swelling and pain.  Take ibuprofen or Tylenol for pain.  Return to the ER for new or worsening pain, swelling, severe headaches, vomiting, persistent nosebleed, or any other new or worsening symptoms that concern you.

## 2018-05-12 NOTE — ED Triage Notes (Signed)
Pt was [facility] from [facility], pt states she was running down the driveway yesterday and lost her balance and fell forward. Pt has noted swelling and bruising to the left side around the eye and chin. Denies LOC, denies any visual changes or N/V.[name]

## 2018-05-12 NOTE — ED Provider Notes (Signed)
[facility] Emergency Department Provider Note ____________________________________________   First MD Initiated Contact with Patient 05/12/18 1147     (approximate)  I have reviewed the triage vital signs and the nursing notes.   HISTORY  Chief Complaint Fall and Head Injury    HPI [name] is a 52 y.o. female with PMH as noted below who presents with head and facial injury, acute onset yesterday when she was running down the driveway and fell from standing height, not associated with LOC.  Patient reports pain to her chin, to her nose, and her forehead.  She also reports mild pain to the left breast.  She denies other injuries.  She went to urgent care who sent her to the emergency department.   Past Medical History:  Diagnosis Date  . Allergy   . Anxiety   . Arthritis   . Asthma   . Cataract    both eyes  surgically repaired  . Depression    treated  . Dyspnea   . Fibrocystic breast disease   . GERD (gastroesophageal reflux disease)   . History of hiatal hernia    repaired  . History of kidney stones    questionable  . Hyperlipidemia   . Hypertension   . Hypothyroidism   . Irritable bowel syndrome   . Ovarian cyst   . Pancreatitis   . PONV (postoperative nausea and vomiting)    NO TROUBLE WITH CATARACT SURGERY  . Psoriasis   . Psoriatic arthritis ([place]) 05/12/2016   Poor response to Humira Inadequate response to Enbrel Inadequate response to Simponi  . Tachycardia   . Ulcerative colitis     Patient Active Problem List   Diagnosis Date Noted  . Allergic rhinitis 04/19/2018  . Snoring 04/19/2018  . COPD (chronic obstructive pulmonary disease) ([place]) 03/26/2018  . Rectal bleeding 01/22/2018  . LLQ abdominal pain 01/22/2018  . Dysphagia 01/12/2017  . 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 Primary osteoarthritis of both knees 08/11/2016  . History of hypertension 08/11/2016  . History  of ulcerative colitis 08/11/2016  . History of IBS 08/11/2016  . 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 High risk medications (not anticoagulants) long-term use 05/13/2016  . Knee pain, bilateral 05/13/2016  . Anserine bursitis 05/13/2016  . 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 Chondromalacia patellae, right knee 05/13/2016  . Psoriatic arthritis ([place]) 05/12/2016  . UNSPECIFIED HYPOTHYROIDISM 08/24/2009  . HYPERLIPIDEMIA 08/24/2009  . Essential hypertension 08/24/2009  . GERD 08/24/2009  . Ulcerative colitis ([place]) 08/24/2009  . IRRITABLE BOWEL SYNDROME 08/24/2009  . OVARIAN CYST 08/24/2009  . PSORIASIS 08/24/2009  . ARTHRITIS 08/24/2009  . TACHYCARDIA, HX OF 08/24/2009  . PANCREATITIS, HX OF 08/24/2009  . FIBROCYSTIC BREAST DISEASE, HX OF 08/24/2009    Past Surgical History:  Procedure Laterality Date  . ABDOMINAL HYSTERECTOMY    . ANKLE RECONSTRUCTION     left  . bladder tuck    . CATARACT EXTRACTION W/PHACO Right 09/11/2017   Procedure: CATARACT EXTRACTION PHACO AND INTRAOCULAR LENS PLACEMENT (IOC);  Surgeon: Birder Robson, MD;  Location: [facility] ORS;  Service: Ophthalmology;  Laterality: Right;  [place] 00:18.5 AP% 6.9 CDE 1.29 Fluid Pack Lot # [id number]  . CATARACT EXTRACTION W/PHACO Left 10/02/2017   Procedure: CATARACT EXTRACTION PHACO AND INTRAOCULAR LENS PLACEMENT ([place]);  Surgeon: Birder Robson, MD;  Location: [facility] ORS;  Service: Ophthalmology;  Laterality: Left;  [place] 00:25.9 AP% 6.9 CDE 1.80 Fluid Pack Lot # [id number]  .  COLONOSCOPY    . EYE SURGERY     Lasik  . FRACTURE SURGERY    . KNEE SURGERY     left  . LAPAROSCOPIC NISSEN FUNDOPLICATION N/A 5/39/7673   Procedure: LAPAROSCOPIC NISSEN TAKEDOWN AND UPPER ENDOSCOPY;  Surgeon: Johnathan Hausen, MD;  Location: [facility] ORS;  Service: General;  Laterality: N/A;  . NASAL SEPTUM SURGERY    . NISSEN FUNDOPLICATION    . PARTIAL HYSTERECTOMY    . POLYPECTOMY    . repair cystocele and rectocele    . thyroid ablation      . TUBAL LIGATION    . UPPER GASTROINTESTINAL ENDOSCOPY    . UPPER GI ENDOSCOPY  01/12/2017   Procedure: UPPER GI ENDOSCOPY;  Surgeon: Johnathan Hausen, MD;  Location: [facility] ORS;  Service: General;;    Prior to Admission medications   Medication Sig Start Date End Date Taking? Authorizing Provider  aspirin-acetaminophen-caffeine (EXCEDRIN MIGRAINE) [phone] MG tablet Take 2 tablets by mouth every 8 (eight) hours as needed for headache.     [provider]  azelastine (OPTIVAR) 0.05 % ophthalmic solution Place 1 drop into both eyes 2 (two) times daily as needed (for allergies).  06/06/17   [provider]  Calcium Carbonate-Vit D-Min (CALTRATE 600+D PLUS PO) Take 1 tablet by mouth daily.    [provider]  clobetasol ointment (TEMOVATE) 4.09 % Apply 1 application topically 2 (two) times daily. Use for 4-7 days and wait 1 week before reusing Patient taking differently: Apply 1 application topically 2 (two) times daily as needed (for psoriasis). Use for 4-7 days and wait 1 week before reusing 01/09/17   Panwala, Naitik, PA-C  dicyclomine (BENTYL) 20 MG tablet Take 1 tablet (20 mg total) by mouth 2 (two) times daily. Patient taking differently: Take 20 mg by mouth as needed.  01/20/18   Jacqlyn Larsen, PA-C  estradiol (ESTRACE) 1 MG tablet Take 1 mg by mouth daily.    [provider]  fluticasone (FLONASE) 50 MCG/ACT nasal spray Place 1 spray into both nostrils daily.     [provider]  folic acid (FOLVITE) 1 MG tablet Take 2 mg by mouth daily.    [provider]  ibuprofen (ADVIL,MOTRIN) 200 MG tablet Take 400 mg by mouth every 6 (six) hours as needed for mild pain.     [provider]  levocetirizine (XYZAL) 5 MG tablet Take 5 mg by mouth every evening.    [provider]  levothyroxine (SYNTHROID, LEVOTHROID) 125 MCG tablet Take 125 mcg by mouth every other day.     [provider]  levothyroxine (SYNTHROID,  LEVOTHROID) 137 MCG tablet Take 137 mcg by mouth every other day.    [provider]  methotrexate 50 MG/2ML injection INJECT 1 ML SUBCUTANEOUSLY  WEEKLY 05/06/18   Bo Merino, MD  montelukast (SINGULAIR) 10 MG tablet Take 10 mg by mouth at bedtime.    [provider]  Multiple Vitamins-Minerals (CENTRUM SILVER ULTRA WOMENS PO) Take 1 tablet by mouth daily.    [provider]  omeprazole (PRILOSEC) 20 MG capsule TAKE 1 CAPSULE EVERY DAY BEFORE BREAKFAST 12/03/17   Nandigam, Venia Minks, MD  ondansetron (ZOFRAN) 4 MG tablet Take 1 tablet (4 mg total) by mouth every 8 (eight) hours as needed for nausea or vomiting. 03/01/17   Mauri Pole, MD  rosuvastatin (CRESTOR) 20 MG tablet Take 20 mg by mouth daily.    [provider]  sertraline (ZOLOFT) 100 MG tablet Take  100 mg by mouth daily.    [provider]  Tofacitinib Citrate (XELJANZ) 5 MG TABS Take 1 tablet (5 mg total) by mouth 2 (two) times daily. 05/07/18   Bo Merino, MD  Tuberculin-Allergy Syringes 27G X 1/2" 1 ML KIT Inject 1 Syringe into the skin once a week. To be used with weekly methotrexate injections 08/14/16   Bo Merino, MD  VENTOLIN HFA 108 (90 Base) MCG/ACT inhaler Inhale 2 puffs into the lungs every 6 (six) hours as needed for wheezing or shortness of breath.  06/06/17   [provider]    Allergies Imuran [azathioprine]; Meperidine hcl; Remicade [infliximab]; Sulfasalazine; Avelox [moxifloxacin]; Doxycycline; Erythromycin; and Latex  Family History  Problem Relation Age of Onset  . Breast cancer Mother 67  . Heart disease Mother   . Colon polyps Mother   . Other Mother        pre-cancerous tumor removed  . Colon cancer Mother   . Heart disease Father   . Crohn's disease Sister   . Alcoholism Sister   . Irritable bowel syndrome Unknown   . Crohn's disease Daughter   . Breast cancer Paternal Aunt   . Breast cancer Maternal Grandmother   .  Esophageal cancer Neg Hx   . Rectal cancer Neg Hx   . Stomach cancer Neg Hx   . Pancreatic cancer Neg Hx     Social History Social History   Tobacco Use  . Smoking status: Former Smoker    Last attempt to quit: 07/27/2005    Years since quitting: 12.8  . Smokeless tobacco: Never Used  Substance Use Topics  . Alcohol use: Yes    Alcohol/week: 0.0 standard drinks    Comment: occasionally  . Drug use: No    Review of Systems  Constitutional: No fever. Eyes: No redness. ENT: No neck pain. Cardiovascular: Denies chest pain. Respiratory: Denies shortness of breath. Gastrointestinal: No vomiting.  Genitourinary: Negative for flank pain.  Musculoskeletal: Negative for back pain. Skin: Negative for rash. Neurological: Positive for mild headache.   ____________________________________________   PHYSICAL EXAM:  VITAL SIGNS: ED Triage Vitals  Enc Vitals Group     BP 05/12/18 1011 (!) 149/72     Pulse Rate 05/12/18 1011 88     Resp 05/12/18 1011 16     Temp 05/12/18 1011 97.8 F (36.6 C)     Temp Source 05/12/18 1011 Oral     SpO2 05/12/18 1011 97 %     Weight 05/12/18 1005 220 lb (99.8 kg)     Height 05/12/18 1005 5' 6"  (1.676 m)     Head Circumference --      Peak Flow --      Pain Score 05/12/18 1011 4     Pain Loc --      Pain Edu? --      Excl. in [place]? --     Constitutional: Alert and oriented. Well appearing and in no acute distress. Eyes: Conjunctivae are normal.  EOMI.  PERRLA. Head: Abrasion to forehead.  Ecchymosis to left periorbital area and chin.   Nose: No congestion/rhinnorhea.  Swelling to the nasal bridge.  No septal hematoma. Mouth/Throat: Mucous membranes are moist.   Neck: Normal range of motion.  No midline cervical spinal tenderness. Cardiovascular:  Good peripheral circulation. Respiratory: Normal respiratory effort.   Gastrointestinal: No distention.  Musculoskeletal:  Extremities warm and well perfused.  Neurologic:  Normal speech and  language.  Motor and sensory intact in all extremities.  Normal coordination.  Cranial nerves II through XII intact.  No gross focal neurologic deficits are appreciated.  Skin:  Skin is warm and dry. No rash noted. Psychiatric: Mood and affect are normal. Speech and behavior are normal.  ____________________________________________   LABS (all labs ordered are listed, but only abnormal results are displayed)  Labs Reviewed - No data to display ____________________________________________  EKG   ____________________________________________  RADIOLOGY  CT head: No ICH or other acute findings CT maxillofacial: Minimally displaced nasal bone fracture. ____________________________________________   PROCEDURES  Procedure(s) performed: No  Procedures  Critical Care performed: No ____________________________________________   INITIAL IMPRESSION / ASSESSMENT AND PLAN / ED COURSE  Pertinent labs & imaging results that were available during my care of the patient were reviewed by me and considered in my medical decision making (see chart for details).  52 year old female presents with facial injury after a fall from standing height yesterday.  She primarily has swelling and abrasions to the face, including the nose and chin.  On exam she is well-appearing and her vital signs are normal.  Neuro exam is nonfocal.  The remainder of the exam is as described above.  CTs were obtained from triage which revealed nasal bone fracture but no other acute findings.  I counseled the patient on the results of the work-up.  She is comfortable to go home at this time.  I will give ENT referral.  No indication for antibiotics or other medications at this time.  Return precautions given, and she expresses understanding.   ____________________________________________   FINAL CLINICAL IMPRESSION(S) / ED DIAGNOSES  Final diagnoses:  Closed fracture of nasal bone, initial encounter  Injury of  head, initial encounter      NEW MEDICATIONS STARTED DURING THIS VISIT:  New Prescriptions   No medications on file     Note:  This document was prepared using Dragon voice recognition software and may include unintentional dictation errors.    Arta Silence, MD 05/12/18 1222

## 2018-05-15 ENCOUNTER — Other Ambulatory Visit: Payer: Self-pay | Admitting: Rheumatology

## 2018-05-15 ENCOUNTER — Telehealth: Payer: Self-pay | Admitting: Pharmacy Technician

## 2018-05-15 NOTE — Telephone Encounter (Signed)
Last Visit: 05/07/18 Next Visit: 10/14/18 Labs: 05/07/18 WNL  Okay to refill per Dr. Estanislado Pandy

## 2018-05-15 NOTE — Telephone Encounter (Signed)
Spoke to patient advised that we completed and faxed in the provider portion to [facility] for [id number] renewal application. Patient stated she already sent in her portion. Awaiting response.  9:51 AM [name], CPhT

## 2018-05-17 DIAGNOSIS — W1830XA Fall on same level, unspecified, initial encounter: Secondary | ICD-10-CM | POA: Diagnosis not present

## 2018-05-17 DIAGNOSIS — S022XXA Fracture of nasal bones, initial encounter for closed fracture: Secondary | ICD-10-CM | POA: Diagnosis not present

## 2018-05-17 DIAGNOSIS — R1314 Dysphagia, pharyngoesophageal phase: Secondary | ICD-10-CM | POA: Diagnosis not present

## 2018-05-17 DIAGNOSIS — K219 Gastro-esophageal reflux disease without esophagitis: Secondary | ICD-10-CM | POA: Diagnosis not present

## 2018-05-20 ENCOUNTER — Other Ambulatory Visit: Payer: Self-pay

## 2018-05-20 ENCOUNTER — Encounter: Payer: Self-pay | Admitting: *Deleted

## 2018-05-21 ENCOUNTER — Ambulatory Visit
Admission: RE | Admit: 2018-05-21 | Discharge: 2018-05-21 | Disposition: A | Discharge status: Home / Self Care | Payer: Medicare HMO | Source: Ambulatory Visit | Attending: Otolaryngology | Admitting: Otolaryngology

## 2018-05-21 ENCOUNTER — Encounter
Admission: RE | Disposition: A | Discharge status: Home / Self Care | Payer: Self-pay | Source: Ambulatory Visit | Attending: Otolaryngology

## 2018-05-21 ENCOUNTER — Ambulatory Visit: Payer: Medicare HMO | Admitting: Anesthesiology

## 2018-05-21 DIAGNOSIS — Z87891 Personal history of nicotine dependence: Secondary | ICD-10-CM | POA: Insufficient documentation

## 2018-05-21 DIAGNOSIS — F329 Major depressive disorder, single episode, unspecified: Secondary | ICD-10-CM | POA: Insufficient documentation

## 2018-05-21 DIAGNOSIS — E785 Hyperlipidemia, unspecified: Secondary | ICD-10-CM | POA: Diagnosis not present

## 2018-05-21 DIAGNOSIS — F419 Anxiety disorder, unspecified: Secondary | ICD-10-CM | POA: Insufficient documentation

## 2018-05-21 DIAGNOSIS — Z79899 Other long term (current) drug therapy: Secondary | ICD-10-CM | POA: Insufficient documentation

## 2018-05-21 DIAGNOSIS — S022XXA Fracture of nasal bones, initial encounter for closed fracture: Secondary | ICD-10-CM | POA: Diagnosis not present

## 2018-05-21 DIAGNOSIS — E039 Hypothyroidism, unspecified: Secondary | ICD-10-CM | POA: Diagnosis not present

## 2018-05-21 DIAGNOSIS — W010XXA Fall on same level from slipping, tripping and stumbling without subsequent striking against object, initial encounter: Secondary | ICD-10-CM | POA: Diagnosis not present

## 2018-05-21 DIAGNOSIS — Y9301 Activity, walking, marching and hiking: Secondary | ICD-10-CM | POA: Insufficient documentation

## 2018-05-21 DIAGNOSIS — I10 Essential (primary) hypertension: Secondary | ICD-10-CM | POA: Diagnosis not present

## 2018-05-21 DIAGNOSIS — Y92008 Other place in unspecified non-institutional (private) residence as the place of occurrence of the external cause: Secondary | ICD-10-CM | POA: Diagnosis not present

## 2018-05-21 HISTORY — PX: CLOSED REDUCTION NASAL FRACTURE: SHX5365

## 2018-05-21 SURGERY — CLOSED REDUCTION, FRACTURE, NASAL BONE
Anesthesia: General | Site: Nose

## 2018-05-21 MED ORDER — OXYCODONE HCL 5 MG PO TABS
5.0000 mg | ORAL_TABLET | Freq: Once | ORAL | Status: AC | PRN
  Administered 2018-05-21: 5 mg via ORAL

## 2018-05-21 MED ORDER — OXYCODONE HCL 5 MG/5ML PO SOLN: 5.0000 mg | Freq: Once | ORAL | Status: AC | PRN

## 2018-05-21 MED ORDER — MIDAZOLAM HCL 5 MG/5ML IJ SOLN
INTRAMUSCULAR | Status: DC | PRN
  Administered 2018-05-21: 2 mg via INTRAVENOUS

## 2018-05-21 MED ORDER — LIDOCAINE HCL (CARDIAC) PF 100 MG/5ML IV SOSY
PREFILLED_SYRINGE | INTRAVENOUS | Status: DC | PRN
  Administered 2018-05-21: 40 mg via INTRATRACHEAL

## 2018-05-21 MED ORDER — DEXAMETHASONE SODIUM PHOSPHATE 4 MG/ML IJ SOLN
INTRAMUSCULAR | Status: DC | PRN
  Administered 2018-05-21: 10 mg via INTRAVENOUS

## 2018-05-21 MED ORDER — ONDANSETRON HCL 4 MG/2ML IJ SOLN
INTRAMUSCULAR | Status: DC | PRN
  Administered 2018-05-21: 4 mg via INTRAVENOUS

## 2018-05-21 MED ORDER — OXYMETAZOLINE HCL 0.05 % NA SOLN
NASAL | Status: DC | PRN
  Administered 2018-05-21: 1 via TOPICAL

## 2018-05-21 MED ORDER — FENTANYL CITRATE (PF) 100 MCG/2ML IJ SOLN
INTRAMUSCULAR | Status: DC | PRN
  Administered 2018-05-21 (×2): 50 ug via INTRAVENOUS

## 2018-05-21 MED ORDER — HYDROMORPHONE HCL 1 MG/ML IJ SOLN
0.2500 mg | INTRAMUSCULAR | Status: DC | PRN
  Administered 2018-05-21: 0.5 mg via INTRAVENOUS

## 2018-05-21 MED ORDER — HYDROCODONE-ACETAMINOPHEN 5-325 MG PO TABS: 1.0000 | ORAL_TABLET | Freq: Four times a day (QID) | ORAL | 0 refills | Status: DC | PRN

## 2018-05-21 MED ORDER — LACTATED RINGERS IV SOLN
1000.0000 mL | INTRAVENOUS | Status: DC
  Administered 2018-05-21: 1000 mL via INTRAVENOUS

## 2018-05-21 MED ORDER — ACETAMINOPHEN 10 MG/ML IV SOLN
1000.0000 mg | Freq: Once | INTRAVENOUS | Status: AC
  Administered 2018-05-21: 1000 mg via INTRAVENOUS

## 2018-05-21 MED ORDER — PROPOFOL 10 MG/ML IV BOLUS
INTRAVENOUS | Status: DC | PRN
  Administered 2018-05-21: 130 mg via INTRAVENOUS

## 2018-05-21 MED ORDER — FENTANYL CITRATE (PF) 100 MCG/2ML IJ SOLN
25.0000 ug | INTRAMUSCULAR | Status: DC | PRN
  Administered 2018-05-21 (×2): 50 ug via INTRAVENOUS

## 2018-05-21 MED ORDER — GLYCOPYRROLATE 0.2 MG/ML IJ SOLN
INTRAMUSCULAR | Status: DC | PRN
  Administered 2018-05-21: 0.1 mg via INTRAVENOUS

## 2018-05-21 SURGICAL SUPPLY — 15 items
ADH LQ OCL WTPRF AMP STRL LF (MISCELLANEOUS) ×1
ADHESIVE MASTISOL STRL (MISCELLANEOUS) ×2 IMPLANT
AQUAPLAST 3X3 FLAT (MISCELLANEOUS) ×2
BASIN GRAD PLASTIC 32OZ STRL (MISCELLANEOUS) ×2 IMPLANT
CUP MEDICINE 2OZ PLAST GRAD ST (MISCELLANEOUS) ×2 IMPLANT
GAUZE SPONGE 4X4 12PLY STRL (GAUZE/BANDAGES/DRESSINGS) ×2 IMPLANT
GLOVE BIO SURGEON STRL SZ7.5 (GLOVE) ×2 IMPLANT
KIT TURNOVER KIT A (KITS) ×2 IMPLANT
MARKER SKIN DUAL TIP RULER LAB (MISCELLANEOUS) ×2 IMPLANT
NDL HYPO 25GX1X1/2 BEV (NEEDLE) ×1 IMPLANT
NEEDLE HYPO 25GX1X1/2 BEV (NEEDLE) ×2 IMPLANT
PATTIES SURGICAL .5 X3 (DISPOSABLE) ×1 IMPLANT
SPLINT AQUAPLAST 3X3 FLAT (MISCELLANEOUS) IMPLANT
STRIP CLOSURE SKIN 1/2X4 (GAUZE/BANDAGES/DRESSINGS) ×2 IMPLANT
TOWEL OR 17X26 4PK STRL BLUE (TOWEL DISPOSABLE) ×2 IMPLANT

## 2018-05-21 NOTE — Op Note (Signed)
05/21/2018 10:17 AM  [name] [id number]   Pre-Op Dx: NASAL FRACTURE  Post-op Dx: SAME  Procedure: Closed reduction of nasal fracture   Surgeon: Riley Nearing., MD  Anesthesia: General Endotracheal   EBL: Minimal   Complications: None   Findings: Nasal dorsum deviated to the right with bilateral  nasal bone fractures, depressed on the left with step-off deformity  Procedure: After the patient was identified in holding and the history and physical and consent was reviewed, the patient was taken to the operating room and placed in a supine position. General endotracheal anesthesia was induced in the normal fashion. The patient was draped with the eyes protected. The nose was decongested with Afrin moistened pledgets. After allowing time for decongestion, these were removed.   A Boies elevator was then placed intranasally, and used to manipulate the nasal bone fragments until the nasal dorsum appeared to be midline with no palpable step-off deformity.  Following this, the skin was prepped with Mastisol, and 1/2 Ster-strips applied to the nose. Next an Aquaplast splint was fashioned to fit the nasal dorsum, and placed for protection of the nasal bones during healing. This was further secured with Steri-strips. The care of patient was returned to anesthesia, awakened, and transferred to recovery in stable condition.   Disposition: PACU to home   Plan: Regular diet. Ice pack to nose as needed for pain and swelling. Keep nasal splint in place until follow-up. Limit exercise and strenuous activity for the next week. Recheck my office once week.   Riley Nearing., MD  10:17 AM 05/21/2018

## 2018-05-21 NOTE — H&P (Signed)
History and physical reviewed and will be scanned in later. No change in medical status reported by the patient or family, appears stable for surgery. All questions regarding the procedure answered, and patient (or family if a child) expressed understanding of the procedure.  [name] @TODAY @

## 2018-05-21 NOTE — Anesthesia Procedure Notes (Addendum)
Procedure Name: LMA Insertion Date/Time: 05/21/2018 10:07 AM Performed by: Mayme Genta, CRNA Pre-anesthesia Checklist: Patient identified, Emergency Drugs available, Suction available, Timeout performed and Patient being monitored Patient Re-evaluated:Patient Re-evaluated prior to induction Oxygen Delivery Method: Circle system utilized Preoxygenation: Pre-oxygenation with 100% oxygen Induction Type: IV induction LMA: LMA inserted LMA Size: 4.0 Number of attempts: 1 Placement Confirmation: positive ETCO2 and breath sounds checked- equal and bilateral Tube secured with: Tape

## 2018-05-21 NOTE — Transfer of Care (Signed)
Immediate Anesthesia Transfer of Care Note  Patient: [name]  Procedure(s) Performed: CLOSED REDUCTION NASAL FRACTURE (N/A Nose)  Patient Location: PACU  Anesthesia Type: General LMA  Level of Consciousness: awake, alert  and patient cooperative  Airway and Oxygen Therapy: Patient Spontanous Breathing and Patient connected to supplemental oxygen  Post-op Assessment: Post-op Vital signs reviewed, Patient's Cardiovascular Status Stable, Respiratory Function Stable, Patent Airway and No signs of Nausea or vomiting  Post-op Vital Signs: Reviewed and stable  Complications: No apparent anesthesia complications

## 2018-05-21 NOTE — Discharge Instructions (Signed)
General Anesthesia, Adult, Care After °These instructions provide you with information about caring for yourself after your procedure. Your health care provider may also give you more specific instructions. Your treatment has been planned according to current medical practices, but problems sometimes occur. Call your health care provider if you have any problems or questions after your procedure. °What can I expect after the procedure? °After the procedure, it is common to have: °· Vomiting. °· A sore throat. °· Mental slowness. ° °It is common to feel: °· Nauseous. °· Cold or shivery. °· Sleepy. °· Tired. °· Sore or achy, even in parts of your body where you did not have surgery. ° °Follow these instructions at home: °For at least 24 hours after the procedure: °· Do not: °? Participate in activities where you could fall or become injured. °? Drive. °? Use heavy machinery. °? Drink alcohol. °? Take sleeping pills or medicines that cause drowsiness. °? Make important decisions or sign legal documents. °? Take care of children on your own. °· Rest. °Eating and drinking °· If you vomit, drink water, juice, or soup when you can drink without vomiting. °· Drink enough fluid to keep your urine clear or pale yellow. °· Make sure you have little or no nausea before eating solid foods. °· Follow the diet recommended by your health care provider. °General instructions °· Have a responsible adult stay with you until you are awake and alert. °· Return to your normal activities as told by your health care provider. Ask your health care provider what activities are safe for you. °· Take over-the-counter and prescription medicines only as told by your health care provider. °· If you smoke, do not smoke without supervision. °· Keep all follow-up visits as told by your health care provider. This is important. °Contact a health care provider if: °· You continue to have nausea or vomiting at home, and medicines are not helpful. °· You  cannot drink fluids or start eating again. °· You cannot urinate after 8-12 hours. °· You develop a skin rash. °· You have fever. °· You have increasing redness at the site of your procedure. °Get help right away if: °· You have difficulty breathing. °· You have chest pain. °· You have unexpected bleeding. °· You feel that you are having a life-threatening or urgent problem. °This information is not intended to replace advice given to you by your health care provider. Make sure you discuss any questions you have with your health care provider. °Document Released: 10/09/2000 Document Revised: 12/06/2015 Document Reviewed: 06/17/2015 °Elsevier Interactive Patient Education © 2018 [employer] ° °

## 2018-05-21 NOTE — Anesthesia Preprocedure Evaluation (Signed)
Anesthesia Evaluation  Patient identified by MRN, date of birth, ID band Patient awake    Reviewed: Allergy & Precautions, H&P , NPO status , Patient's Chart, lab work & pertinent test results  History of Anesthesia Complications (+) PONV and history of anesthetic complications  Airway Mallampati: I  TM Distance: >3 FB Neck ROM: full    Dental no notable dental hx.    Pulmonary asthma , COPD, former smoker,    Pulmonary exam normal breath sounds clear to auscultation       Cardiovascular hypertension, Normal cardiovascular exam Rhythm:regular Rate:Normal     Neuro/Psych    GI/Hepatic Neg liver ROS, hiatal hernia, Medicated,  Endo/Other  Hypothyroidism   Renal/GU negative Renal ROS     Musculoskeletal   Abdominal   Peds  Hematology negative hematology ROS (+)   Anesthesia Other Findings   Reproductive/Obstetrics                             Anesthesia Physical Anesthesia Plan  ASA: II  Anesthesia Plan: General LMA   Post-op Pain Management:    Induction:   PONV Risk Score and Plan:   Airway Management Planned:   Additional Equipment:   Intra-op Plan:   Post-operative Plan:   Informed Consent: I have reviewed the patients History and Physical, chart, labs and discussed the procedure including the risks, benefits and alternatives for the proposed anesthesia with the patient or authorized representative who has indicated his/her understanding and acceptance.     Plan Discussed with:   Anesthesia Plan Comments:         Anesthesia Quick Evaluation

## 2018-05-21 NOTE — Anesthesia Postprocedure Evaluation (Signed)
Anesthesia Post Note  Patient: [name]  Procedure(s) Performed: CLOSED REDUCTION NASAL FRACTURE (N/A Nose)  Patient location during evaluation: PACU Anesthesia Type: General Level of consciousness: awake and alert Pain management: pain level controlled Vital Signs Assessment: post-procedure vital signs reviewed and stable Respiratory status: spontaneous breathing Cardiovascular status: blood pressure returned to baseline Anesthetic complications: no    Jaci Standard, III,  Jenaya Saar D

## 2018-05-22 ENCOUNTER — Encounter: Payer: Self-pay | Admitting: Otolaryngology

## 2018-05-27 ENCOUNTER — Ambulatory Visit (HOSPITAL_BASED_OUTPATIENT_CLINIC_OR_DEPARTMENT_OTHER): Payer: Medicare HMO | Attending: Emergency Medicine | Admitting: Pulmonary Disease

## 2018-05-27 VITALS — Ht 66.0 in | Wt 218.0 lb

## 2018-05-27 DIAGNOSIS — Z6835 Body mass index (BMI) 35.0-35.9, adult: Secondary | ICD-10-CM | POA: Diagnosis not present

## 2018-05-27 DIAGNOSIS — J449 Chronic obstructive pulmonary disease, unspecified: Secondary | ICD-10-CM | POA: Insufficient documentation

## 2018-05-27 DIAGNOSIS — E669 Obesity, unspecified: Secondary | ICD-10-CM | POA: Insufficient documentation

## 2018-05-27 DIAGNOSIS — I10 Essential (primary) hypertension: Secondary | ICD-10-CM | POA: Insufficient documentation

## 2018-05-27 DIAGNOSIS — G4733 Obstructive sleep apnea (adult) (pediatric): Secondary | ICD-10-CM | POA: Insufficient documentation

## 2018-05-27 DIAGNOSIS — R400 Somnolence: Secondary | ICD-10-CM | POA: Insufficient documentation

## 2018-05-31 ENCOUNTER — Other Ambulatory Visit: Payer: Self-pay | Admitting: Family Medicine

## 2018-05-31 DIAGNOSIS — N6320 Unspecified lump in the left breast, unspecified quadrant: Secondary | ICD-10-CM

## 2018-06-04 DIAGNOSIS — G4733 Obstructive sleep apnea (adult) (pediatric): Secondary | ICD-10-CM | POA: Diagnosis not present

## 2018-06-04 DIAGNOSIS — R400 Somnolence: Secondary | ICD-10-CM | POA: Diagnosis not present

## 2018-06-04 NOTE — Procedures (Signed)
    Patient Name: [name], [name] Date: 05/27/2018 Gender: Female D.O.B: [birth year] Age (years): 52 Referring Provider: Baltazar Apo Height (inches): 55 Interpreting Physician: Chesley Mires MD, ABSM Weight (lbs): 218 RPSGT: Carolin Coy BMI: 35 MRN: [id number] Neck Size: 16.00  CLINICAL INFORMATION  Sleep Study Type: NPSG  Indication for sleep study: COPD, Hypertension, Obesity, Snoring  Epworth Sleepiness Score: 16  SLEEP STUDY TECHNIQUE  As per the AASM Manual for the Scoring of Sleep and Associated Events v2.3 (April 2016) with a hypopnea requiring 4% desaturations. The channels recorded and monitored were frontal, central and occipital EEG, electrooculogram (EOG), submentalis EMG (chin), nasal and oral airflow, thoracic and abdominal wall motion, anterior tibialis EMG, snore microphone, electrocardiogram, and pulse oximetry. MEDICATIONS  Medications self-administered by patient taken the night of the study : N/A SLEEP ARCHITECTURE  The study was initiated at 10:05:43 PM and ended at 4:51:13 AM. Sleep onset time was 13.8 minutes and the sleep efficiency was 82.9%%. The total sleep time was 336.2 minutes. Stage REM latency was N/A minutes. The patient spent 15.2%% of the night in stage N1 sleep, 83.0%% in stage N2 sleep, 1.8%% in stage N3 and 0% in REM. Alpha intrusion was absent. Supine sleep was 33.37%. RESPIRATORY PARAMETERS  The overall apnea/hypopnea index (AHI) was 5.7 per hour. There were 3 total apneas, including 1 obstructive, 0 central and 2 mixed apneas. There were 29 hypopneas and 131 RERAs. The AHI during Stage REM sleep was N/A per hour. AHI while supine was 9.6 per hour. The mean oxygen saturation was 95.2%. The minimum SpO2 during sleep was 91.0%. soft snoring was noted during this study. CARDIAC DATA  The 2 lead EKG demonstrated sinus rhythm. The mean heart rate was 85.8 beats per minute. Other EKG findings include: None. LEG MOVEMENT DATA  The  total PLMS were 0 with a resulting PLMS index of 0.0. Associated arousal with leg movement index was 0.2 . IMPRESSIONS  - Mild obstructive sleep apnea with an AHI of 5.7 and SpO2 low of 91%. DIAGNOSIS  - Obstructive Sleep Apnea (327.23 [G47.33 ICD-10]) RECOMMENDATIONS  - Additional therapies include weight loss, CPAP, oral appliance, or surgical assessment.  [Electronically signed] 06/04/2018 03:04 PM  Chesley Mires MD, [place], American Board of Sleep Medicine  NPI: [id number]

## 2018-06-05 ENCOUNTER — Ambulatory Visit
Admission: RE | Admit: 2018-06-05 | Discharge: 2018-06-05 | Disposition: A | Discharge status: Home / Self Care | Payer: Medicare HMO | Source: Ambulatory Visit | Attending: Family Medicine | Admitting: Family Medicine

## 2018-06-05 DIAGNOSIS — N6002 Solitary cyst of left breast: Secondary | ICD-10-CM | POA: Diagnosis not present

## 2018-06-05 DIAGNOSIS — N6320 Unspecified lump in the left breast, unspecified quadrant: Secondary | ICD-10-CM

## 2018-06-05 DIAGNOSIS — R922 Inconclusive mammogram: Secondary | ICD-10-CM | POA: Diagnosis not present

## 2018-06-12 ENCOUNTER — Encounter (INDEPENDENT_AMBULATORY_CARE_PROVIDER_SITE_OTHER): Payer: Self-pay | Admitting: Family

## 2018-06-12 ENCOUNTER — Ambulatory Visit (INDEPENDENT_AMBULATORY_CARE_PROVIDER_SITE_OTHER): Payer: Medicare HMO | Admitting: Family

## 2018-06-12 VITALS — Ht 66.0 in | Wt 218.0 lb

## 2018-06-12 DIAGNOSIS — M19079 Primary osteoarthritis, unspecified ankle and foot: Secondary | ICD-10-CM

## 2018-06-12 NOTE — Progress Notes (Signed)
Office Visit Note   Patient: [name]           Date of Birth: 1966/01/11           MRN: [id number] Visit Date: 06/12/2018              Requested by: Mayra Neer, MD [address] [place], [place] [address] PCP: Mayra Neer, MD  Chief Complaint  Patient presents with  . Left Foot - Pain    Great toe Pain; NP      HPI: The patient is a 52 year old woman seen today for initial evaluation of left great toe pain. Pain at the MTP joint laterally. Has been ongoing for greater than 6 months. Worse with ambulation and rom of joint. Some throbbing and aching at night. Mild swelling.   Was tested for gout in may, negative Does have psoriatic arthritis.   Radiographs of feet from may reviewed today.  In flexible sole walking shoes today. States all her shoes are like this.   Assessment & Plan: Visit Diagnoses: No diagnosis found.  Plan: provided pennsaid sample. Will hold on oral NSAIDs. Has GERD. Advised stiff soled walking shoes such as new balance walking. Follow up in office as needed.  Follow-Up Instructions: No follow-ups on file.   Ortho Exam  Patient is alert, oriented, no adenopathy, well-dressed, normal affect, normal respiratory effort. Flexion and extension of great toe elicit pain. No edema no erythema. Mild pain with flexion and extension of ip joint. No valgus deformity of GT.   Imaging: No results found. No images are attached to the encounter.  Labs: Lab Results  Component Value Date   ESRSEDRATE 39 (H) 01/22/2018   ESRSEDRATE 17 03/01/2017   ESRSEDRATE 16 07/22/2015   CRP 0.3 (L) 07/22/2015   LABURIC 5.1 05/07/2018     Lab Results  Component Value Date   ALBUMIN 3.8 01/20/2018   ALBUMIN 3.8 03/01/2017   ALBUMIN 4.0 01/09/2017   LABURIC 5.1 05/07/2018    Body mass index is 35.19 kg/m.  Orders:  No orders of the defined types were placed in this encounter.  No orders of the defined types were placed in this  encounter.    Procedures: No procedures performed  Clinical Data: No additional findings.  ROS:  All other systems negative, except as noted in the HPI. Review of Systems  Objective: Vital Signs: Ht 5' 6"  (1.676 m)   Wt 218 lb (98.9 kg)   BMI 35.19 kg/m   Specialty Comments:  No specialty comments available.  PMFS History: Patient Active Problem List   Diagnosis Date Noted  . Allergic rhinitis 04/19/2018  . Snoring 04/19/2018  . COPD (chronic obstructive pulmonary disease) ([place]) 03/26/2018  . Rectal bleeding 01/22/2018  . LLQ abdominal pain 01/22/2018  . Dysphagia 01/12/2017  . 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 Primary osteoarthritis of both knees 08/11/2016  . History of hypertension 08/11/2016  . History of ulcerative colitis 08/11/2016  . History of IBS 08/11/2016  . 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 High risk medications (not anticoagulants) long-term use 05/13/2016  . Knee pain, bilateral 05/13/2016  . Anserine bursitis 05/13/2016  . 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 Chondromalacia patellae, right knee 05/13/2016  . Psoriatic arthritis ([place]) 05/12/2016  . UNSPECIFIED HYPOTHYROIDISM 08/24/2009  . HYPERLIPIDEMIA 08/24/2009  . Essential hypertension 08/24/2009  . GERD 08/24/2009  . Ulcerative colitis ([place]) 08/24/2009  . IRRITABLE BOWEL SYNDROME 08/24/2009  .  OVARIAN CYST 08/24/2009  . PSORIASIS 08/24/2009  . ARTHRITIS 08/24/2009  . TACHYCARDIA, HX OF 08/24/2009  . PANCREATITIS, HX OF 08/24/2009  . FIBROCYSTIC BREAST DISEASE, HX OF 08/24/2009   Past Medical History:  Diagnosis Date  . Allergy   . Anxiety   . Arthritis   . Asthma   . Cataract    both eyes  surgically repaired  . Depression    treated  . Dyspnea   . Fibrocystic breast disease   . GERD (gastroesophageal reflux disease)   . History of hiatal hernia    repaired  . History of kidney stones    questionable  . Hyperlipidemia     . Hypertension    pt denies  . Hypothyroidism   . Irritable bowel syndrome   . Ovarian cyst   . Pancreatitis   . PONV (postoperative nausea and vomiting)    NO TROUBLE WITH CATARACT SURGERY  . Psoriasis   . Psoriatic arthritis ([place]) 05/12/2016   Poor response to Humira Inadequate response to Enbrel Inadequate response to Simponi  . Tachycardia   . Ulcerative colitis     Family History  Problem Relation Age of Onset  . Breast cancer Mother 62  . Heart disease Mother   . Colon polyps Mother   . Other Mother        pre-cancerous tumor removed  . Colon cancer Mother   . Heart disease Father   . Crohn's disease Sister   . Alcoholism Sister   . Irritable bowel syndrome Unknown   . Crohn's disease Daughter   . Breast cancer Paternal Aunt   . Breast cancer Maternal Grandmother   . Esophageal cancer Neg Hx   . Rectal cancer Neg Hx   . Stomach cancer Neg Hx   . Pancreatic cancer Neg Hx     Past Surgical History:  Procedure Laterality Date  . ABDOMINAL HYSTERECTOMY    . ANKLE RECONSTRUCTION     left  . bladder tuck    . CATARACT EXTRACTION W/PHACO Right 09/11/2017   Procedure: CATARACT EXTRACTION PHACO AND INTRAOCULAR LENS PLACEMENT ([facility]);  Surgeon: Birder Robson, MD;  Location: [facility];  Service: Ophthalmology;  Laterality: Right;  [place] 00:18.5 AP% 6.9 CDE 1.29 Fluid Pack Lot # [id number]  . CATARACT EXTRACTION W/PHACO Left 10/02/2017   Procedure: CATARACT EXTRACTION PHACO AND INTRAOCULAR LENS PLACEMENT ([place]);  Surgeon: Birder Robson, MD;  Location: [facility];  Service: Ophthalmology;  Laterality: Left;  [place] 00:25.9 AP% 6.9 CDE 1.80 Fluid Pack Lot # [id number]  . CLOSED REDUCTION NASAL FRACTURE N/A 05/21/2018   Procedure: CLOSED REDUCTION NASAL FRACTURE;  Surgeon: Clyde Canterbury, MD;  Location: [facility];  Service: ENT;  Laterality: N/A;  Latex sensitivity  . COLONOSCOPY    . EYE SURGERY     Lasik  . FRACTURE SURGERY    . KNEE SURGERY     left  . LAPAROSCOPIC  NISSEN FUNDOPLICATION N/A 3/54/5625   Procedure: LAPAROSCOPIC NISSEN TAKEDOWN AND UPPER ENDOSCOPY;  Surgeon: Johnathan Hausen, MD;  Location: [facility];  Service: General;  Laterality: N/A;  . NASAL SEPTUM SURGERY    . NISSEN FUNDOPLICATION    . PARTIAL HYSTERECTOMY    . POLYPECTOMY    . repair cystocele and rectocele    . thyroid ablation    . TUBAL LIGATION    . UPPER GASTROINTESTINAL ENDOSCOPY    . UPPER GI ENDOSCOPY  01/12/2017   Procedure: UPPER GI ENDOSCOPY;  Surgeon: Johnathan Hausen, MD;  Location: [facility];  Service: General;;   Social History   Occupational History  . 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 Occupation: disabled  Tobacco Use  . Smoking status: Former Smoker    Last attempt to quit: 07/27/2005    Years since quitting: 12.8  . Smokeless tobacco: Never Used  Substance and Sexual Activity  . Alcohol use: Yes    Alcohol/week: 0.0 standard drinks    Comment:  occasionally (1x/mo)  . Drug use: No  . Sexual activity: Yes

## 2018-06-14 ENCOUNTER — Other Ambulatory Visit: Payer: Self-pay | Admitting: Rheumatology

## 2018-06-17 NOTE — Telephone Encounter (Signed)
Last Visit: 05/07/18 Next Visit: 10/14/18 Labs: 05/07/18 WNL  Okay to refill per Dr. Estanislado Pandy

## 2018-06-18 ENCOUNTER — Encounter: Payer: Self-pay | Admitting: Emergency Medicine

## 2018-06-18 ENCOUNTER — Ambulatory Visit: Payer: Medicare HMO | Admitting: Emergency Medicine

## 2018-06-18 VITALS — BP 156/80 | HR 93 | Ht 66.0 in | Wt 227.6 lb

## 2018-06-18 DIAGNOSIS — G4733 Obstructive sleep apnea (adult) (pediatric): Secondary | ICD-10-CM

## 2018-06-18 DIAGNOSIS — J301 Allergic rhinitis due to pollen: Secondary | ICD-10-CM

## 2018-06-18 DIAGNOSIS — J449 Chronic obstructive pulmonary disease, unspecified: Secondary | ICD-10-CM | POA: Diagnosis not present

## 2018-06-18 MED ORDER — VENTOLIN HFA 108 (90 BASE) MCG/ACT IN AERS: 2.0000 | INHALATION_SPRAY | Freq: Four times a day (QID) | RESPIRATORY_TRACT | 5 refills | Status: DC | PRN

## 2018-06-18 NOTE — Assessment & Plan Note (Signed)
AHI 5.7/h.  We discussed the different options including slow steady weight loss.  She is interested in this but she also is interested in trying to address her sleep issues acutely.  I will refer her for CPAP, auto titration 5-20 cmH2O, best fit mask.  Follow-up in 3 months to assess her compliance and any benefit.

## 2018-06-18 NOTE — Patient Instructions (Addendum)
We reviewed your sleep study today. It shows mild sleep apnea. We will try starting CPAP to see if you get benefit.  Please continue your Xyzal and Singulair as you are taking them  Keep albuterol available to use 2 puffs up to every 4 hours if needed for shortness of breath, chest tightness, wheezing.  Flu shot up to date.  Follow with Dr Lamonte Sakai in 3 months or sooner if you have any problems

## 2018-06-18 NOTE — Progress Notes (Signed)
Subjective:    Patient ID: [name], female    DOB: 02-13-1966, 52 y.o.   MRN: [id number]  HPI 52 year old former smoker (25 pack years) with a history of ulcerative colitis (pancolitis), psoriatic arthritis on methotrexate.  She is also been on several different biologic therapies, currently using Xeljanz.  She has had allergy testing in May 2019, a lot of sneezing, rhinitis. She is referred here today for wheezing and bronchitic symptoms.   She had URI sx in June that led to cough that persisted. Treated with abx and pred x 3. Also developed some wheeze - responded to BD in PCP office. She was given Symbicort in May. Cough is now better, not using any albuterol right now. She can have dyspnea w exertion, has noticed for about 6 months.   CXR 02/08/18 reviewed and looks normal.   ROV 04/19/18 --52 year old former smoker with ulcerative colitis, psoriatic arthritis, methotrexate and Xeljanz use.  I saw her after an episode of wheezing and dyspnea, cough following an upper respiratory infection.  Symptoms persisted for an extended period of time.  She ended up on bronchodilators as above, also on Xyzal, Singulair for allergic rhinitis.  Pulmonary function testing was done today which is consistent with mixed obstruction and restriction without a bronchodilator response.  Her lung volumes are normal and her DLCO is normal.  FEV1 2.13 L (72% predicted).  We stopped her Symbicort last time and she reports that she didn't really miss it. She still has exertional SOB with activity, sometimes wheeze as well. Rare albuterol use.  She reports daytime somnolence, snoring, morning headache.  ROV 06/18/18 --[name] is a 52, former smoker with a history of psoriatic arthritis on methotrexate and Xeljanz, ulcerative colitis.  She has allergic rhinitis and some mixed restriction and obstruction by pulmonary function testing.  I've seen her for dyspnea wheezing and cough.  We have been managing her on  albuterol as needed, Xyzal and Singulair for her allergic disease and component to her obstruction and cough. She underwent a split-night sleep study on 05/27/2018 that showed mild OSA, AHI 5.7, no periodic limb movements. CPAP was not placed.  Nasal congestion is stable. She uses albuterol about once a day, usually w activity.    Review of Systems  Constitutional: Negative for fever and unexpected weight change.  HENT: Positive for congestion, ear pain, postnasal drip, sinus pressure and sneezing. Negative for dental problem, nosebleeds, rhinorrhea and trouble swallowing.   Eyes: Negative for redness and itching.  Respiratory: Positive for cough, shortness of breath and wheezing. Negative for chest tightness.   Cardiovascular: Negative for palpitations and leg swelling.  Gastrointestinal: Negative for nausea and vomiting.  Genitourinary: Negative for dysuria.  Musculoskeletal: Negative for joint swelling.  Skin: Negative for rash.  Allergic/Immunologic: Negative.  Negative for environmental allergies, food allergies and immunocompromised state.  Neurological: Negative for headaches.  Hematological: Does not bruise/bleed easily.  Psychiatric/Behavioral: Negative for dysphoric mood. The patient is not nervous/anxious.     Past Medical History:  Diagnosis Date  . Allergy   . Anxiety   . Arthritis   . Asthma   . Cataract    both eyes  surgically repaired  . Depression    treated  . Dyspnea   . Fibrocystic breast disease   . GERD (gastroesophageal reflux disease)   . History of hiatal hernia    repaired  . History of kidney stones    questionable  . Hyperlipidemia   . Hypertension  pt denies  . Hypothyroidism   . Irritable bowel syndrome   . Ovarian cyst   . Pancreatitis   . PONV (postoperative nausea and vomiting)    NO TROUBLE WITH CATARACT SURGERY  . Psoriasis   . Psoriatic arthritis ([place]) 05/12/2016   Poor response to Humira Inadequate response to Enbrel Inadequate  response to Simponi  . Tachycardia   . Ulcerative colitis      Family History  Problem Relation Age of Onset  . Breast cancer Mother 83  . Heart disease Mother   . Colon polyps Mother   . Other Mother        pre-cancerous tumor removed  . Colon cancer Mother   . Heart disease Father   . Crohn's disease Sister   . Alcoholism Sister   . Irritable bowel syndrome Unknown   . Crohn's disease Daughter   . Breast cancer Paternal Aunt   . Breast cancer Maternal Grandmother   . Esophageal cancer Neg Hx   . Rectal cancer Neg Hx   . Stomach cancer Neg Hx   . Pancreatic cancer Neg Hx      Social History   Socioeconomic History  . Marital status: Married    Spouse name: Not on file  . Number of children: Not on file  . Years of education: Not on file  . Highest education level: Not on file  Occupational History  . 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 Occupation: disabled  Social Needs  . Financial resource strain: Not on file  . Food insecurity:    Worry: Not on file    Inability: Not on file  . Transportation needs:    Medical: Not on file    Non-medical: Not on file  Tobacco Use  . Smoking status: Former Smoker    Last attempt to quit: 07/27/2005    Years since quitting: 12.9  . Smokeless tobacco: Never Used  Substance and Sexual Activity  . Alcohol use: Yes    Alcohol/week: 0.0 standard drinks    Comment:  occasionally (1x/mo)  . Drug use: No  . Sexual activity: Yes  Lifestyle  . Physical activity:    Days per week: Not on file    Minutes per session: Not on file  . Stress: Not on file  Relationships  . Social connections:    Talks on phone: Not on file    Gets together: Not on file    Attends religious service: Not on file    Active member of club or organization: Not on file    Attends meetings of clubs or organizations: Not on file    Relationship status: Not on file  . Intimate partner violence:    Fear of current or  ex partner: Not on file    Emotionally abused: Not on file    Physically abused: Not on file    Forced sexual activity: Not on file  Other Topics Concern  . Not on file  Social History Narrative  . Not on file     Allergies  Allergen Reactions  . Imuran [Azathioprine] Other (See Comments)    Pancreatitis  . Meperidine Hcl Hives  . Remicade [Infliximab] Hives and Other (See Comments)    Redness  . Sulfasalazine Swelling and Hives  . Avelox [Moxifloxacin] Rash  . Doxycycline Rash  . Erythromycin Rash  . Latex Rash    Gloves and elastic in underwear     Outpatient Medications  Prior to Visit  Medication Sig Dispense Refill  . aspirin-acetaminophen-caffeine (EXCEDRIN MIGRAINE) 250-250-65 MG tablet Take 2 tablets by mouth every 8 (eight) hours as needed for headache.     Marland Kitchen azelastine (OPTIVAR) 0.05 % ophthalmic solution Place 1 drop into both eyes 2 (two) times daily as needed (for allergies).     . Calcium Carbonate-Vit D-Min (CALTRATE 600+D PLUS PO) Take 1 tablet by mouth daily.    . clobetasol ointment (TEMOVATE) 3.38 % Apply 1 application topically 2 (two) times daily. Use for 4-7 days and wait 1 week before reusing (Patient taking differently: Apply 1 application topically 2 (two) times daily as needed (for psoriasis). Use for 4-7 days and wait 1 week before reusing) 30 g 1  . dicyclomine (BENTYL) 20 MG tablet Take 1 tablet (20 mg total) by mouth 2 (two) times daily. (Patient taking differently: Take 20 mg by mouth as needed. ) 20 tablet 0  . estradiol (ESTRACE) 1 MG tablet Take 1 mg by mouth daily.    . fluticasone (FLONASE) 50 MCG/ACT nasal spray Place 1 spray into both nostrils daily.     . folic acid (FOLVITE) 1 MG tablet Take 2 mg by mouth daily.    Marland Kitchen ibuprofen (ADVIL,MOTRIN) 200 MG tablet Take 400 mg by mouth every 6 (six) hours as needed for mild pain.     Marland Kitchen levocetirizine (XYZAL) 5 MG tablet Take 5 mg by mouth every evening.    Marland Kitchen levothyroxine (SYNTHROID, LEVOTHROID) 125  MCG tablet Take 125 mcg by mouth every other day.     . levothyroxine (SYNTHROID, LEVOTHROID) 137 MCG tablet Take 137 mcg by mouth every other day.    . methotrexate 50 MG/2ML injection INJECT 1 ML SUBCUTANEOUSLY  WEEKLY 4 mL 0  . montelukast (SINGULAIR) 10 MG tablet Take 10 mg by mouth at bedtime.    . Multiple Vitamins-Minerals (CENTRUM SILVER ULTRA WOMENS PO) Take 1 tablet by mouth daily.    Marland Kitchen omeprazole (PRILOSEC) 20 MG capsule TAKE 1 CAPSULE EVERY DAY BEFORE BREAKFAST 90 capsule 3  . ondansetron (ZOFRAN) 4 MG tablet Take 1 tablet (4 mg total) by mouth every 8 (eight) hours as needed for nausea or vomiting. 30 tablet 5  . rosuvastatin (CRESTOR) 20 MG tablet Take 20 mg by mouth daily.    . sertraline (ZOLOFT) 100 MG tablet Take 100 mg by mouth daily.    . Tuberculin-Allergy Syringes 27G X 1/2" 1 ML KIT Inject 1 Syringe into the skin once a week. To be used with weekly methotrexate injections 12 each 1  . VENTOLIN HFA 108 (90 Base) MCG/ACT inhaler Inhale 2 puffs into the lungs every 6 (six) hours as needed for wheezing or shortness of breath.     Morrie Sheldon 5 MG TABS Take 1 tablet by mouth twice a day as directed by physician. 60 tablet 0  . HYDROcodone-acetaminophen (NORCO/VICODIN) 5-325 MG tablet Take 1-2 tablets by mouth every 6 (six) hours as needed for moderate pain. 30 tablet 0   No facility-administered medications prior to visit.         Objective:   Physical Exam Vitals:   06/18/18 0958  BP: (!) 156/80  Pulse: 93  SpO2: 98%  Weight: 227 lb 9.6 oz (103.2 kg)  Height: 5' 6"  (1.676 m)   Gen: Pleasant, overwt woman, in no distress,  normal affect  ENT: No lesions,  mouth clear,  oropharynx clear, no postnasal drip  Neck: No JVD, no stridor  Lungs: No use of  accessory muscles, no wheeze, decreased at bases  Cardiovascular: RRR, heart sounds normal, no murmur or gallops, no peripheral edema  Musculoskeletal: No deformities, no cyanosis or clubbing  Neuro: alert, non  focal  Skin: Warm, no lesions or rash      Assessment & Plan:  COPD (chronic obstructive pulmonary disease) ([place]) Fairly well-controlled but she is using her albuterol at least once daily.  If the usage goes up then I think we need to reconsider schedule bronchodilator.  Also recall that her dyspnea is probably multifactorial, probably a component of some restriction and obesity  Allergic rhinitis Continue Singulair, Xyzal as she is taking it  Obstructive sleep apnea syndrome, mild AHI 5.7/h.  We discussed the different options including slow steady weight loss.  She is interested in this but she also is interested in trying to address her sleep issues acutely.  I will refer her for CPAP, auto titration 5-20 cmH2O, best fit mask.  Follow-up in 3 months to assess her compliance and any benefit.  Baltazar Apo, MD, PhD 06/18/2018, 10:30 AM [facility] [phone] or if no answer [phone]

## 2018-06-18 NOTE — Assessment & Plan Note (Signed)
Fairly well-controlled but she is using her albuterol at least once daily.  If the usage goes up then I think we need to reconsider schedule bronchodilator.  Also recall that her dyspnea is probably multifactorial, probably a component of some restriction and obesity

## 2018-06-18 NOTE — Assessment & Plan Note (Signed)
Continue Singulair, Xyzal as she is taking it

## 2018-06-24 DIAGNOSIS — Z961 Presence of intraocular lens: Secondary | ICD-10-CM | POA: Diagnosis not present

## 2018-06-28 ENCOUNTER — Other Ambulatory Visit: Payer: Self-pay | Admitting: Family Medicine

## 2018-06-28 DIAGNOSIS — Z1231 Encounter for screening mammogram for malignant neoplasm of breast: Secondary | ICD-10-CM

## 2018-07-01 DIAGNOSIS — G4733 Obstructive sleep apnea (adult) (pediatric): Secondary | ICD-10-CM | POA: Diagnosis not present

## 2018-07-12 ENCOUNTER — Telehealth: Payer: Self-pay | Admitting: Emergency Medicine

## 2018-07-12 NOTE — Telephone Encounter (Signed)
Attempted to call patient today regarding turning in cpap machine back to DME. I did not receive an answer at time of call. I have left a voicemail message for pt to return call. X1

## 2018-07-12 NOTE — Telephone Encounter (Signed)
Pt is calling back [phone]

## 2018-07-12 NOTE — Telephone Encounter (Signed)
Spoke with patient-states she tried to wear CPAP for at least 2 weeks and was unable to wear it-was not sleeping well at all. She turned CPAP machine back in.   RB she would like to know if you have any further recs since she is only borderline OSA. Thanks.      Pt aware that RB is back in the office on Monday.

## 2018-07-15 NOTE — Telephone Encounter (Signed)
Message previously routed to Dr. Lamonte Sakai

## 2018-07-15 NOTE — Telephone Encounter (Signed)
I think we can follow her off CPAP for now, revisit the pros / cons of retrying when we follow up

## 2018-07-15 NOTE — Telephone Encounter (Signed)
Called patient, unable to reach left message to give us a call back. 

## 2018-07-16 ENCOUNTER — Other Ambulatory Visit: Payer: Self-pay | Admitting: Rheumatology

## 2018-07-16 NOTE — Telephone Encounter (Signed)
Pt is calling back [phone]

## 2018-07-16 NOTE — Telephone Encounter (Signed)
Called and spoke with patient, she is aware and verbalized understanding. Nothing further needed.

## 2018-07-18 NOTE — Telephone Encounter (Signed)
Last Visit: 05/07/18 Next Visit: 10/14/18 Labs: 05/07/18 WNL  Okay to refill per Dr. Estanislado Pandy

## 2018-07-31 DIAGNOSIS — I10 Essential (primary) hypertension: Secondary | ICD-10-CM | POA: Diagnosis not present

## 2018-08-07 ENCOUNTER — Ambulatory Visit
Admission: RE | Admit: 2018-08-07 | Discharge: 2018-08-07 | Disposition: A | Discharge status: Home / Self Care | Payer: Medicare HMO | Source: Ambulatory Visit | Attending: Family Medicine | Admitting: Family Medicine

## 2018-08-07 ENCOUNTER — Encounter (INDEPENDENT_AMBULATORY_CARE_PROVIDER_SITE_OTHER): Payer: Self-pay

## 2018-08-07 DIAGNOSIS — Z1231 Encounter for screening mammogram for malignant neoplasm of breast: Secondary | ICD-10-CM | POA: Diagnosis not present

## 2018-08-15 ENCOUNTER — Ambulatory Visit (INDEPENDENT_AMBULATORY_CARE_PROVIDER_SITE_OTHER): Payer: Medicare HMO

## 2018-08-15 ENCOUNTER — Ambulatory Visit (INDEPENDENT_AMBULATORY_CARE_PROVIDER_SITE_OTHER): Payer: Medicare HMO | Admitting: Orthopedic Surgery

## 2018-08-15 ENCOUNTER — Encounter (INDEPENDENT_AMBULATORY_CARE_PROVIDER_SITE_OTHER): Payer: Self-pay | Admitting: Orthopedic Surgery

## 2018-08-15 VITALS — Ht 66.0 in | Wt 227.0 lb

## 2018-08-15 DIAGNOSIS — M19072 Primary osteoarthritis, left ankle and foot: Secondary | ICD-10-CM | POA: Diagnosis not present

## 2018-08-15 DIAGNOSIS — M25562 Pain in left knee: Secondary | ICD-10-CM | POA: Diagnosis not present

## 2018-08-15 DIAGNOSIS — G8929 Other chronic pain: Secondary | ICD-10-CM

## 2018-08-15 DIAGNOSIS — M19079 Primary osteoarthritis, unspecified ankle and foot: Secondary | ICD-10-CM

## 2018-08-15 DIAGNOSIS — M1712 Unilateral primary osteoarthritis, left knee: Secondary | ICD-10-CM | POA: Diagnosis not present

## 2018-08-15 MED ORDER — LIDOCAINE HCL 1 % IJ SOLN
0.5000 mL | INTRAMUSCULAR | Status: AC | PRN
  Administered 2018-08-15: .5 mL

## 2018-08-15 MED ORDER — METHYLPREDNISOLONE ACETATE 40 MG/ML IJ SUSP
40.0000 mg | INTRAMUSCULAR | Status: AC | PRN
  Administered 2018-08-15: 40 mg via INTRA_ARTICULAR

## 2018-08-15 MED ORDER — LIDOCAINE HCL 1 % IJ SOLN
5.0000 mL | INTRAMUSCULAR | Status: AC | PRN
  Administered 2018-08-15: 5 mL

## 2018-08-15 MED ORDER — METHYLPREDNISOLONE ACETATE 40 MG/ML IJ SUSP
20.0000 mg | INTRAMUSCULAR | Status: AC | PRN
  Administered 2018-08-15: 20 mg via INTRA_ARTICULAR

## 2018-08-15 NOTE — Progress Notes (Signed)
Office Visit Note   Patient: [name]           Date of Birth: 1966/03/21           MRN: [id number] Visit Date: 08/15/2018              Requested by: Mayra Neer, MD [address] [place],  [address] PCP: Mayra Neer, MD  Chief Complaint  Patient presents with  . Left Foot - Pain  . 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  Patient is currently in a soft pair of new balance shoes she states that this has not allowed her to increase her activities or decrease her pain.  She states the left knee pain is primarily over the lateral joint line she complains of popping and decreased range of motion.  Pain going up and down stairs.  She states she has had buckling has had 1 fall.  She complains of pain dorsally over the MTP joint of the left great toe.  Patient is on methotrexate and Morrie Sheldon for her rheumatoid arthritis  Assessment & Plan: Visit Diagnoses:  1. Chronic pain of left knee     Plan: Knee and great toe were injected.  Discussed that if her knee symptoms do not resolve after the injection we would need to set her up for an MRI scan of the left knee to evaluate for lateral meniscal tear.  Follow-Up Instructions: No follow-ups on file.   Ortho Exam  Patient is alert, oriented, no adenopathy, well-dressed, normal affect, normal respiratory effort. Examination patient is a good dorsalis pedis pulse in the left foot good dorsiflexion of the ankle good subtalar motion.  She has dorsiflexion of about 70 degrees and has pain with dorsiflexion with osteophytic bone spurs dorsally.  There is no redness no swelling no cellulitis.  Patient's previous uric acid was 5.1 no evidence of gout.  Semination the left knee collaterals and cruciates are stable there is no effusion no redness.  She is primarily tender to palpation of the lateral  joint line consistent with lateral meniscal pathology.  Imaging: No results found. No images are attached to the encounter.  Labs: Lab Results  Component Value Date   ESRSEDRATE 39 (H) 01/22/2018   ESRSEDRATE 17 03/01/2017   ESRSEDRATE 16 07/22/2015   CRP 0.3 (L) 07/22/2015   LABURIC 5.1 05/07/2018     Lab Results  Component Value Date   ALBUMIN 3.8 01/20/2018   ALBUMIN 3.8 03/01/2017   ALBUMIN 4.0 01/09/2017   LABURIC 5.1 05/07/2018    Body mass index is 36.64 kg/m.  Orders:  Orders Placed This Encounter  Procedures  . XR Knee 1-2 Views Left   No orders of the defined types were placed in this encounter.    Procedures: Large Joint Inj: L knee on 08/15/2018 10:27 AM Indications: pain and diagnostic evaluation Details: 22 G 1.5 in needle, anteromedial approach  Arthrogram: No  Medications: 5 mL lidocaine 1 %; 40 mg methylPREDNISolone acetate 40 MG/ML Outcome: tolerated well, no immediate complications Procedure, treatment alternatives, risks and benefits explained, specific risks discussed. Consent was given by the patient. Immediately prior to procedure a time out was called to verify the correct patient, procedure, equipment, support staff and site/side marked as required. Patient was prepped and draped in the usual sterile fashion.  Small Joint Inj: L great MTP on 08/15/2018 10:28 AM Details: dorsal approach Medications: 0.5 mL lidocaine 1 %; 20 mg methylPREDNISolone acetate 40 MG/ML     Clinical Data: No additional findings.  ROS:  All other systems negative, except as noted in the HPI. Review of Systems  Objective: Vital Signs: Ht 5' 6"  (1.676 m)   Wt 227 lb (103 kg)   BMI 36.64 kg/m   Specialty Comments:  No specialty comments available.  PMFS History: Patient Active Problem List   Diagnosis Date Noted  . Allergic rhinitis 04/19/2018  . Obstructive sleep apnea syndrome, mild 04/19/2018  . COPD (chronic obstructive pulmonary disease)  ([place]) 03/26/2018  . Rectal bleeding 01/22/2018  . LLQ abdominal pain 01/22/2018  . Dysphagia 01/12/2017  . 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 Primary osteoarthritis of both knees 08/11/2016  . History of hypertension 08/11/2016  . History of ulcerative colitis 08/11/2016  . History of IBS 08/11/2016  . 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 High risk medications (not anticoagulants) long-term use 05/13/2016  . Knee pain, bilateral 05/13/2016  . Anserine bursitis 05/13/2016  . 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 Chondromalacia patellae, right knee 05/13/2016  . Psoriatic arthritis ([place]) 05/12/2016  . UNSPECIFIED HYPOTHYROIDISM 08/24/2009  . HYPERLIPIDEMIA 08/24/2009  . Essential hypertension 08/24/2009  . GERD 08/24/2009  . Ulcerative colitis ([place]) 08/24/2009  . IRRITABLE BOWEL SYNDROME 08/24/2009  . OVARIAN CYST 08/24/2009  . PSORIASIS 08/24/2009  . ARTHRITIS 08/24/2009  . TACHYCARDIA, HX OF 08/24/2009  . PANCREATITIS, HX OF 08/24/2009  . FIBROCYSTIC BREAST DISEASE, HX OF 08/24/2009   Past Medical History:  Diagnosis Date  . Allergy   . Anxiety   . Arthritis   . Asthma   . Cataract    both eyes  surgically repaired  . Depression    treated  . Dyspnea   . Fibrocystic breast disease   . GERD (gastroesophageal reflux disease)   . History of hiatal hernia    repaired  . History of kidney stones    questionable  . Hyperlipidemia   . Hypertension    pt denies  . Hypothyroidism   . Irritable bowel syndrome   . Ovarian cyst   . Pancreatitis   . PONV (postoperative nausea and vomiting)    NO TROUBLE WITH CATARACT SURGERY  . Psoriasis   . Psoriatic arthritis ([place]) 05/12/2016   Poor response to Humira Inadequate response to Enbrel Inadequate response to Simponi  . Tachycardia   . Ulcerative colitis     Family History  Problem Relation Age of Onset  . Breast cancer Mother 24  . Heart disease Mother   . Colon polyps  Mother   . Other Mother        pre-cancerous tumor removed  . Colon cancer Mother   . Heart disease Father   . Crohn's disease Sister   . Alcoholism Sister   . Irritable bowel syndrome Other   . Crohn's disease Daughter   . Breast cancer Paternal Aunt   . Breast cancer Maternal Grandmother   . Esophageal cancer Neg Hx   . Rectal cancer Neg Hx   . Stomach cancer Neg Hx   . Pancreatic cancer Neg Hx     Past Surgical History:  Procedure Laterality Date  . ABDOMINAL HYSTERECTOMY    . ANKLE RECONSTRUCTION     left  . bladder tuck    . CATARACT EXTRACTION W/PHACO Right 09/11/2017   Procedure: CATARACT  EXTRACTION PHACO AND INTRAOCULAR LENS PLACEMENT (IOC);  Surgeon: Birder Robson, MD;  Location: [facility];  Service: Ophthalmology;  Laterality: Right;  [place] 00:18.5 AP% 6.9 CDE 1.29 Fluid Pack Lot # [id number]  . CATARACT EXTRACTION W/PHACO Left 10/02/2017   Procedure: CATARACT EXTRACTION PHACO AND INTRAOCULAR LENS PLACEMENT ([place]);  Surgeon: Birder Robson, MD;  Location: [facility];  Service: Ophthalmology;  Laterality: Left;  [place] 00:25.9 AP% 6.9 CDE 1.80 Fluid Pack Lot # [id number]  . CLOSED REDUCTION NASAL FRACTURE N/A 05/21/2018   Procedure: CLOSED REDUCTION NASAL FRACTURE;  Surgeon: Clyde Canterbury, MD;  Location: [facility];  Service: ENT;  Laterality: N/A;  Latex sensitivity  . COLONOSCOPY    . EYE SURGERY     Lasik  . FRACTURE SURGERY    . KNEE SURGERY     left  . LAPAROSCOPIC NISSEN FUNDOPLICATION N/A 0/86/5784   Procedure: LAPAROSCOPIC NISSEN TAKEDOWN AND UPPER ENDOSCOPY;  Surgeon: Johnathan Hausen, MD;  Location: [facility];  Service: General;  Laterality: N/A;  . NASAL SEPTUM SURGERY    . NISSEN FUNDOPLICATION    . PARTIAL HYSTERECTOMY    . POLYPECTOMY    . repair cystocele and rectocele    . thyroid ablation    . TUBAL LIGATION    . UPPER GASTROINTESTINAL ENDOSCOPY    . UPPER GI ENDOSCOPY  01/12/2017   Procedure: UPPER GI ENDOSCOPY;  Surgeon: Johnathan Hausen, MD;   Location: [facility];  Service: General;;   Social History   Occupational History  . 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 Occupation: disabled  Tobacco Use  . Smoking status: Former Smoker    Last attempt to quit: 07/27/2005    Years since quitting: 13.0  . Smokeless tobacco: Never Used  Substance and Sexual Activity  . Alcohol use: Yes    Alcohol/week: 0.0 standard drinks    Comment:  occasionally (1x/mo)  . Drug use: No  . Sexual activity: Yes

## 2018-08-22 ENCOUNTER — Other Ambulatory Visit: Payer: Self-pay

## 2018-08-22 DIAGNOSIS — Z01419 Encounter for gynecological examination (general) (routine) without abnormal findings: Secondary | ICD-10-CM | POA: Diagnosis not present

## 2018-08-22 DIAGNOSIS — Z79899 Other long term (current) drug therapy: Secondary | ICD-10-CM | POA: Diagnosis not present

## 2018-08-22 LAB — COMPLETE METABOLIC PANEL WITH GFR
AG Ratio: 1.5 (calc) (ref 1.0–2.5)
ALT: 15 U/L (ref 6–29)
AST: 16 U/L (ref 10–35)
Albumin: 4.3 g/dL (ref 3.6–5.1)
Alkaline phosphatase (APISO): 58 U/L (ref 37–153)
BUN: 22 mg/dL (ref 7–25)
CO2: 28 mmol/L (ref 20–32)
Calcium: 9.2 mg/dL (ref 8.6–10.4)
Chloride: 103 mmol/L (ref 98–110)
Creat: 0.74 mg/dL (ref 0.50–1.05)
GFR, Est African American: 108 mL/min/{1.73_m2} (ref >=60)
GFR, Est Non African American: 93 mL/min/{1.73_m2} (ref >=60)
Globulin: 2.8 g/dL (calc) (ref 1.9–3.7)
Glucose, Bld: 84 mg/dL (ref 65–99)
Potassium: 4.5 mmol/L (ref 3.5–5.3)
Sodium: 139 mmol/L (ref 135–146)
Total Bilirubin: 0.4 mg/dL (ref 0.2–1.2)
Total Protein: 7.1 g/dL (ref 6.1–8.1)

## 2018-08-22 LAB — CBC WITH DIFFERENTIAL/PLATELET
Absolute Monocytes: 570 cells/uL (ref 200–950)
Basophils Absolute: 30 cells/uL (ref 0–200)
Basophils Relative: 0.4 %
Eosinophils Absolute: 68 cells/uL (ref 15–500)
Eosinophils Relative: 0.9 %
HCT: 40.5 % (ref 35.0–45.0)
Hemoglobin: 13.3 g/dL (ref 11.7–15.5)
Lymphs Abs: 2858 cells/uL (ref 850–3900)
MCH: 30.4 pg (ref 27.0–33.0)
MCHC: 32.8 g/dL (ref 32.0–36.0)
MCV: 92.7 fL (ref 80.0–100.0)
MPV: 11 fL (ref 7.5–12.5)
Monocytes Relative: 7.6 %
Neutro Abs: 3975 cells/uL (ref 1500–7800)
Neutrophils Relative %: 53 %
Platelets: 259 10*3/uL (ref 140–400)
RBC: 4.37 10*6/uL (ref 3.80–5.10)
RDW: 12.5 % (ref 11.0–15.0)
Total Lymphocyte: 38.1 %
WBC: 7.5 10*3/uL (ref 3.8–10.8)

## 2018-08-23 NOTE — Progress Notes (Signed)
WNL

## 2018-09-19 ENCOUNTER — Ambulatory Visit: Payer: Self-pay | Admitting: Primary Care

## 2018-09-19 NOTE — Telephone Encounter (Signed)
Called to check the status of application, Per Rep Verdene Lennert, the case is pending, but she will put in a rush request due to patient has not had a refill since 06/2018. Should get a determination in 1-3 business days.  Called patient to advise, she still currently has medication on hand at home.

## 2018-09-23 ENCOUNTER — Ambulatory Visit: Payer: Medicare HMO | Admitting: Emergency Medicine

## 2018-09-23 ENCOUNTER — Encounter: Payer: Self-pay | Admitting: Emergency Medicine

## 2018-09-23 ENCOUNTER — Telehealth: Payer: Self-pay | Admitting: Pharmacy Technician

## 2018-09-23 DIAGNOSIS — G4733 Obstructive sleep apnea (adult) (pediatric): Secondary | ICD-10-CM

## 2018-09-23 DIAGNOSIS — J301 Allergic rhinitis due to pollen: Secondary | ICD-10-CM | POA: Diagnosis not present

## 2018-09-23 DIAGNOSIS — L4050 Arthropathic psoriasis, unspecified: Secondary | ICD-10-CM | POA: Diagnosis not present

## 2018-09-23 DIAGNOSIS — J449 Chronic obstructive pulmonary disease, unspecified: Secondary | ICD-10-CM

## 2018-09-23 MED ORDER — TOFACITINIB CITRATE 5 MG PO TABS: ORAL_TABLET | ORAL | 0 refills | Status: DC

## 2018-09-23 NOTE — Telephone Encounter (Signed)
Last visit: 05/07/18 Next visit: 10/14/18 Labs: 08/22/18 WNL  Okay to refill per Dr. Estanislado Pandy

## 2018-09-23 NOTE — Patient Instructions (Signed)
Please keep your albuterol available to use 2 puffs before exercise or if you need it for chest tightness, wheezing, shortness of breath. Congratulations on working on your conditioning and weight loss.  Keep up the good work.  This will help your mild sleep apnea and your overall breathing Continue your other medications as you have been taking them. Follow with Dr Lamonte Sakai in 6 months or sooner if you have any problems

## 2018-09-23 NOTE — Progress Notes (Signed)
Subjective:    Patient ID: ALYCE INSCORE, female    DOB: March 10, 1966, 53 y.o.   MRN: [id number]  HPI 53 year old former smoker (25 pack years) with a history of ulcerative colitis (pancolitis), psoriatic arthritis on methotrexate.  She is also been on several different biologic therapies, currently using Xeljanz.  She has had allergy testing in May 2019, a lot of sneezing, rhinitis. She is referred here today for wheezing and bronchitic symptoms.   She had URI sx in June that led to cough that persisted. Treated with abx and pred x 3. Also developed some wheeze - responded to BD in PCP office. She was given Symbicort in May. Cough is now better, not using any albuterol right now. She can have dyspnea w exertion, has noticed for about 6 months.   CXR 02/08/18 reviewed and looks normal.   ROV 04/19/18 --53 year old former smoker with ulcerative colitis, psoriatic arthritis, methotrexate and Xeljanz use.  I saw her after an episode of wheezing and dyspnea, cough following an upper respiratory infection.  Symptoms persisted for an extended period of time.  She ended up on bronchodilators as above, also on Xyzal, Singulair for allergic rhinitis.  Pulmonary function testing was done today which is consistent with mixed obstruction and restriction without a bronchodilator response.  Her lung volumes are normal and her DLCO is normal.  FEV1 2.13 L (72% predicted).  We stopped her Symbicort last time and she reports that she didn't really miss it. She still has exertional SOB with activity, sometimes wheeze as well. Rare albuterol use.  She reports daytime somnolence, snoring, morning headache.  ROV 06/18/18 --Ms. Arechiga is a 53, former smoker with a history of psoriatic arthritis on methotrexate and Xeljanz, ulcerative colitis.  She has allergic rhinitis and some mixed restriction and obstruction by pulmonary function testing.  I've seen her for dyspnea wheezing and cough.  We have been managing her on  albuterol as needed, Xyzal and Singulair for her allergic disease and component to her obstruction and cough. She underwent a split-night sleep study on 05/27/2018 that showed mild OSA, AHI 5.7, no periodic limb movements. CPAP was not placed.  Nasal congestion is stable. She uses albuterol about once a day, usually w activity.   ROV 09/23/2018 --53 year old woman who follows up today for her history of former tobacco, COPD, new diagnosis of mild OSA (November 2019).  She also has psoriatic arthritis on methotrexate and Xeljanz, ulcerative colitis.  Finally she has allergic rhinitis for which she has been treated with Singulair, Xyzal, fluticasone nasal spray.  Chronic cough due to this and also reflux on omeprazole.  We tried her on CPAP to see if she can tolerate, get benefit.  She reports that she had difficulty with both nasal pillows and full face mask. She could not tolerate, gave it back. She has been pre-treating exercise w albuterol, uses prn about 1-2x a month. She can occasionally hear wheeze, again w exercise.    Review of Systems  Constitutional: Negative for fever and unexpected weight change.  HENT: Positive for congestion, ear pain, postnasal drip, sinus pressure and sneezing. Negative for dental problem, nosebleeds, rhinorrhea and trouble swallowing.   Eyes: Negative for redness and itching.  Respiratory: Positive for cough, shortness of breath and wheezing. Negative for chest tightness.   Cardiovascular: Negative for palpitations and leg swelling.  Gastrointestinal: Negative for nausea and vomiting.  Genitourinary: Negative for dysuria.  Musculoskeletal: Negative for joint swelling.  Skin: Negative for rash.  Allergic/Immunologic:  Negative.  Negative for environmental allergies, food allergies and immunocompromised state.  Neurological: Negative for headaches.  Hematological: Does not bruise/bleed easily.  Psychiatric/Behavioral: Negative for dysphoric mood. The patient is not  nervous/anxious.         Objective:   Physical Exam Vitals:   09/23/18 1046  BP: 126/88  Pulse: 84  SpO2: 98%  Weight: 217 lb (98.4 kg)  Height: 5' 6"  (1.676 m)   Gen: Pleasant, overwt woman, in no distress,  normal affect  ENT: No lesions,  mouth clear,  oropharynx clear, no postnasal drip  Neck: No JVD, no stridor  Lungs: No use of accessory muscles, no wheeze, decreased at bases  Cardiovascular: RRR, heart sounds normal, no murmur or gallops, no peripheral edema  Musculoskeletal: No deformities, no cyanosis or clubbing  Neuro: alert, non focal  Skin: Warm, no lesions or rash      Assessment & Plan:  COPD (chronic obstructive pulmonary disease) (H[facility]) Moderately decreased FEV1, possibly some superimposed restriction.  Based on her current status I think she can continue to use albuterol judiciously.  I do not think we need to start a schedule bronchodilator at this time.  We will follow-up to assess her breathing in 6 months.  She will probably benefit from the exercise, weight loss that she has undertaken.  Allergic rhinitis Continue her current regimen  Obstructive sleep apnea syndrome, mild She tried CPAP but was unable to tolerate it.  She sent it back.  This has motivated her to work on diet, exercise, weight loss.  I suspect that if she is able to drop some weight that the obstructive sleep apnea will become less relevant.  We will follow-up in 6 months  Psoriatic arthritis ([place]) On methotrexate.  We will determine timing of repeat chest x-ray at her next visit.  Baltazar Apo, MD, PhD 09/23/2018, 11:05 AM [facility] [phone] or if no answer [phone]

## 2018-09-23 NOTE — Assessment & Plan Note (Addendum)
Moderately decreased FEV1, possibly some superimposed restriction.  Based on her current status I think she can continue to use albuterol judiciously.  I do not think we need to start a schedule bronchodilator at this time.  We will follow-up to assess her breathing in 6 months.  She will probably benefit from the exercise, weight loss that she has undertaken.

## 2018-09-23 NOTE — Telephone Encounter (Signed)
Received fax from [place], patient's renewal application has been APPROVED. Coverage dates are from 09/20/2018 to 07/17/2019.   Will send documents to [place].   Phone# [phone] Fax# [phone]

## 2018-09-23 NOTE — Assessment & Plan Note (Signed)
She tried CPAP but was unable to tolerate it.  She sent it back.  This has motivated her to work on diet, exercise, weight loss.  I suspect that if she is able to drop some weight that the obstructive sleep apnea will become less relevant.  We will follow-up in 6 months

## 2018-09-23 NOTE — Telephone Encounter (Signed)
Received a voicemail from [facility] requesting refills for Morrie Sheldon for patient.  Phone# [phone]

## 2018-09-23 NOTE — Assessment & Plan Note (Signed)
Continue her current regimen

## 2018-09-23 NOTE — Assessment & Plan Note (Signed)
On methotrexate.  We will determine timing of repeat chest x-ray at her next visit.

## 2018-09-27 NOTE — Telephone Encounter (Signed)
Patient left message requesting refills for Xeljanz. Called [facility], and rep advised that the proram needs another hard copy prescription, due to pharmacy used renewal application rx for refills in 2019. Printed form and will get signed and faxed on Monday. Patient has 2 weeks of medication on hand.

## 2018-10-14 ENCOUNTER — Encounter: Payer: Self-pay | Admitting: Rheumatology

## 2018-10-14 ENCOUNTER — Telehealth (INDEPENDENT_AMBULATORY_CARE_PROVIDER_SITE_OTHER): Payer: Medicare HMO | Admitting: Rheumatology

## 2018-10-14 DIAGNOSIS — M7072 Other bursitis of hip, left hip: Secondary | ICD-10-CM

## 2018-10-14 DIAGNOSIS — M47816 Spondylosis without myelopathy or radiculopathy, lumbar region: Secondary | ICD-10-CM

## 2018-10-14 DIAGNOSIS — M170 Bilateral primary osteoarthritis of knee: Secondary | ICD-10-CM

## 2018-10-14 DIAGNOSIS — L4050 Arthropathic psoriasis, unspecified: Secondary | ICD-10-CM | POA: Diagnosis not present

## 2018-10-14 DIAGNOSIS — Z8679 Personal history of other diseases of the circulatory system: Secondary | ICD-10-CM | POA: Diagnosis not present

## 2018-10-14 DIAGNOSIS — L409 Psoriasis, unspecified: Secondary | ICD-10-CM | POA: Diagnosis not present

## 2018-10-14 DIAGNOSIS — Z8719 Personal history of other diseases of the digestive system: Secondary | ICD-10-CM | POA: Diagnosis not present

## 2018-10-14 DIAGNOSIS — M5136 Other intervertebral disc degeneration, lumbar region: Secondary | ICD-10-CM

## 2018-10-14 DIAGNOSIS — Z79899 Other long term (current) drug therapy: Secondary | ICD-10-CM | POA: Diagnosis not present

## 2018-10-14 NOTE — Progress Notes (Signed)
Virtual Visit via Telephone Note  I connected with [name] on 10/14/18 at 11:00 AM EDT by telephone and verified that I am speaking with the correct person using two identifiers.   I discussed the limitations, risks, security and privacy concerns of performing an evaluation and management service by telephone and the availability of in person appointments. I also discussed with the patient that there may be a patient responsible charge related to this service. The patient expressed understanding and agreed to proceed.  CC: Medication monitoring  History of Present Illness: Patient is a 53 year old female with a past medical history of psoriatic arthritis, DDD, UC, and osteoarthritis.  She is taking Xeljanz 5 mg by mouth BID and MTX 1 ml sq once weekly.  She denies any recent flares.  She denies any joint swelling.  She continues to have left [facility] joint pain.  She was evaluated by Dr. Sharol Given on 08/15/18/ for left great toe pain.  She states he injected the joint and her pain has resolved. Her psoriasis has improved since switching from [place] to [place].  She denies any lower back pain.  She denies any recent UC flares. She denies any constipation, diarrhea, or blood in stool. Overall she feels this combination of medications have been very effective.      Review of Systems  Constitutional: Positive for malaise/fatigue. Negative for chills.  Eyes: Negative for photophobia, pain and redness.  Respiratory: Negative for cough, shortness of breath (DX with COPD) and wheezing.   Cardiovascular: Negative for chest pain and palpitations.  Gastrointestinal: Negative for constipation, diarrhea and nausea.  Genitourinary: Negative for dysuria.  Musculoskeletal: Negative for back pain, joint pain, myalgias and neck pain.  Skin: Negative for rash.  Neurological: Negative for dizziness and headaches.  Psychiatric/Behavioral: Positive for depression (on Zoloft). The patient is nervous/anxious.     Observations/Objective: Physical Exam Neurological:     Mental Status: She is alert.  Psychiatric:        Mood and Affect: Mood normal.        Behavior: Behavior normal.        Thought Content: Thought content normal.   Morning stiffness lasting 30 minutes.   Report difficulty climbing steps due to knee joint pain.  Denies difficulty getting up from a chair.  Denies nocturnal pain. Denies difficulty writing.  No difficulty raising arms above her head. Complete fist formation.   Assessment and Plan: Psoriatic arthritis: She has no joint pain or joint swelling at this time. She has not had any recent flares. She has intermittent left [facility] joint pain but no joint swelling.  The left great toe pain she was experiencing several months ago has resolved.  She had a cortisone injection performed by Dr. Sharol Given on 08/15/18.  Her psoriasis has been well controlled.  She has no SI joint pain at this time.  No achilles tendonitis or plantar fasciitis. She is clinically doing well on combination of Xeljanz 5 mg BID and MTX 1 mg sq once weekly.  She will continue on this current treatment regimen.  She does not need any refills.  She will follow up in 3 months.  Psoriasis: Well controlled.   High risk medication use: She is on Xeljanz 5 mg BID and MTX 1 ml sq once weekly.  CBC and CMP were WNL on 08/22/18.  Future order for TB god was placed today.  DDD, lumbar:She has no lower back pain at this time.  Primary osteoarthritis of both knees:  She has no joint pain or joint swelling at this time.  She has some difficulty going up steps due to knee joint pain.  Spondylosis of lumbar region without myelopathy or radiculopathy:  History of ulcerative colitis: She has not had any recent flares.  She has no diarrhea, constipation, or blood in her stool.  No abdominal pain.  She is on Xeljanz 5 mg BID.    Follow Up Instructions: She will follow up in 3 months.   She is due for CBC and CMP in May and every 3 months.  Standing orders are in place. Future order for TB gold ordered today.     I discussed the assessment and treatment plan with the patient. The patient was provided an opportunity to ask questions and all were answered. The patient agreed with the plan and demonstrated an understanding of the instructions.   The patient was advised to call back or seek an in-person evaluation if the symptoms worsen or if the condition fails to improve as anticipated.  I provided 23 minutes of non-face-to-face time during this encounter.  Bo Merino, MD   Scribed by- Ofilia Neas, PA-C

## 2018-10-15 ENCOUNTER — Telehealth: Payer: Self-pay | Admitting: Rheumatology

## 2018-10-15 DIAGNOSIS — Z79899 Other long term (current) drug therapy: Secondary | ICD-10-CM

## 2018-10-15 NOTE — Telephone Encounter (Signed)
Patient left a voicemail stating she was returning a call to the office.

## 2018-10-15 NOTE — Telephone Encounter (Signed)
Patient states she has a telemedicine visit yesterday and was asked to go get a TB Gold. Patient states she called the lab and the do not have the order.Released the order for the patient.

## 2018-10-18 ENCOUNTER — Telehealth (INDEPENDENT_AMBULATORY_CARE_PROVIDER_SITE_OTHER): Payer: Self-pay | Admitting: Rheumatology

## 2018-10-18 LAB — QUANTIFERON-TB GOLD PLUS
QuantiFERON Mitogen Value: 6.53 IU/mL
QuantiFERON Nil Value: 0.04 IU/mL
QuantiFERON TB1 Ag Value: 0.05 IU/mL
QuantiFERON TB2 Ag Value: 0.05 IU/mL
QuantiFERON-TB Gold Plus: NEGATIVE

## 2018-10-18 NOTE — Telephone Encounter (Signed)
TB gold is negative.

## 2018-10-18 NOTE — Telephone Encounter (Signed)
Spoke with patient and made her aware that the TB gold was negative.

## 2018-10-23 ENCOUNTER — Other Ambulatory Visit: Payer: Self-pay | Admitting: *Deleted

## 2018-10-23 MED ORDER — METHOTREXATE SODIUM CHEMO INJECTION 50 MG/2ML: INTRAMUSCULAR | 0 refills | Status: DC

## 2018-10-23 NOTE — Telephone Encounter (Signed)
Refill request received via fax  Last visit: 05/07/18 Next visit: 10/14/18 Labs: 08/22/18 WNL  Okay to refill per Dr. Estanislado Pandy

## 2018-12-25 ENCOUNTER — Ambulatory Visit: Payer: Medicare HMO | Admitting: Orthopedic Surgery

## 2018-12-25 ENCOUNTER — Ambulatory Visit: Payer: Medicare HMO | Admitting: Family Medicine

## 2019-01-02 ENCOUNTER — Other Ambulatory Visit: Payer: Self-pay | Admitting: Rheumatology

## 2019-01-02 NOTE — Telephone Encounter (Addendum)
Last Visit: 10/14/18 Next Visit: 02/03/19 Labs: 08/22/18 WNL  Patient advised she is due to update labs. Patient states she was advised during her telemedicine visit that she was okat to wait until her visit on 02/03/19 to get labs. It is noted in the chart that she was due for labs in May.   Okay to refill MTX?

## 2019-01-02 NOTE — Telephone Encounter (Signed)
Ok to give 30 d supply

## 2019-01-10 DIAGNOSIS — E7800 Pure hypercholesterolemia, unspecified: Secondary | ICD-10-CM | POA: Diagnosis not present

## 2019-01-10 DIAGNOSIS — N289 Disorder of kidney and ureter, unspecified: Secondary | ICD-10-CM | POA: Diagnosis not present

## 2019-01-10 DIAGNOSIS — R000 Tachycardia, unspecified: Secondary | ICD-10-CM | POA: Diagnosis not present

## 2019-01-10 DIAGNOSIS — N6019 Diffuse cystic mastopathy of unspecified breast: Secondary | ICD-10-CM | POA: Diagnosis not present

## 2019-01-10 DIAGNOSIS — L409 Psoriasis, unspecified: Secondary | ICD-10-CM | POA: Diagnosis not present

## 2019-01-10 DIAGNOSIS — E039 Hypothyroidism, unspecified: Secondary | ICD-10-CM | POA: Diagnosis not present

## 2019-01-10 DIAGNOSIS — K219 Gastro-esophageal reflux disease without esophagitis: Secondary | ICD-10-CM | POA: Diagnosis not present

## 2019-01-10 DIAGNOSIS — I10 Essential (primary) hypertension: Secondary | ICD-10-CM | POA: Diagnosis not present

## 2019-01-10 DIAGNOSIS — Z6835 Body mass index (BMI) 35.0-35.9, adult: Secondary | ICD-10-CM | POA: Diagnosis not present

## 2019-01-20 NOTE — Progress Notes (Signed)
Office Visit Note  Patient: [name]             Date of Birth: [birth year]           MRN: [id number]             PCP: Mayra Neer, MD Referring: Mayra Neer, MD Visit Date: 02/03/2019 Occupation: @GUAROCC @  Subjective:  Left SI joint pain    History of Present Illness: [name] is a 53 y.o. female with history of psoriatic arthritis, osteoarthritis, DDD.  Patient is on Xeljanz 5 mg twice daily, methotrexate 1 mL every week, and folic acid 2 mg by mouth daily.  She denies any recent psoriatic arthritis flares.  She states she continues to have intermittent pain in bilateral hands and the left great toe.  She states that often times the left toe will swell if she stands for prolonged peers of time.  She states that she was evaluated for gout but her uric acid was within normal limits.  She was evaluated by Dr. Sharol Given in January and had a cortisone injection in the first MTP joint provided temporary relief.  She denies any Achilles tendinitis or plantar fasciitis.  She continues to have intermittent left SI joint pain.  She performs sitting exercises on a regular basis.  She currently has psoriasis on the plantar aspect of both feet.  She has been applying clobetasol as needed topically.  Activities of Daily Living:  Patient reports morning stiffness for 45 minutes.   Patient Denies nocturnal pain.  Difficulty dressing/grooming: Denies Difficulty climbing stairs: Reports Difficulty getting out of chair: Denies Difficulty using hands for taps, buttons, cutlery, and/or writing: Denies  Review of Systems  Constitutional: Positive for fatigue.  HENT: Negative for mouth sores, mouth dryness and nose dryness.   Eyes: Negative for pain, visual disturbance and dryness.  Respiratory: Negative for cough, hemoptysis, shortness of breath and difficulty breathing.   Cardiovascular: Negative for chest pain, palpitations, hypertension and swelling in legs/feet.  Gastrointestinal:  Negative for blood in stool, constipation and diarrhea.  Endocrine: Negative for increased urination.  Genitourinary: Negative for difficulty urinating and painful urination.  Musculoskeletal: Positive for arthralgias, joint pain and morning stiffness. Negative for joint swelling, myalgias, muscle weakness, muscle tenderness and myalgias.  Skin: Positive for rash. Negative for color change, pallor, hair loss, nodules/bumps, skin tightness, ulcers and sensitivity to sunlight.  Allergic/Immunologic: Negative for susceptible to infections.  Neurological: Negative for dizziness, numbness, headaches and weakness.  Hematological: Negative for bruising/bleeding tendency and swollen glands.  Psychiatric/Behavioral: Positive for sleep disturbance. Negative for depressed mood. The patient is not nervous/anxious.     PMFS History:  Patient Active Problem List   Diagnosis Date Noted  . Allergic rhinitis 04/19/2018  . Obstructive sleep apnea syndrome, mild 04/19/2018  . COPD (chronic obstructive pulmonary disease) ([place]) 03/26/2018  . Rectal bleeding 01/22/2018  . LLQ abdominal pain 01/22/2018  . Dysphagia 01/12/2017  . 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 Primary osteoarthritis of both knees 08/11/2016  . History of hypertension 08/11/2016  . History of ulcerative colitis 08/11/2016  . History of IBS 08/11/2016  . 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 High risk medications (not anticoagulants) long-term use 05/13/2016  . Knee pain, bilateral 05/13/2016  . Anserine bursitis 05/13/2016  . 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 Chondromalacia patellae, right knee 05/13/2016  . Psoriatic arthritis ([place]) 05/12/2016  . UNSPECIFIED HYPOTHYROIDISM 08/24/2009  . HYPERLIPIDEMIA 08/24/2009  . Essential  hypertension 08/24/2009  . GERD 08/24/2009  . Ulcerative colitis ([place]) 08/24/2009  . IRRITABLE BOWEL SYNDROME 08/24/2009  . OVARIAN CYST 08/24/2009  .  PSORIASIS 08/24/2009  . ARTHRITIS 08/24/2009  . TACHYCARDIA, HX OF 08/24/2009  . PANCREATITIS, HX OF 08/24/2009  . FIBROCYSTIC BREAST DISEASE, HX OF 08/24/2009    Past Medical History:  Diagnosis Date  . Allergy   . Anxiety   . Arthritis   . Asthma   . Cataract    both eyes  surgically repaired  . Depression    treated  . Dyspnea   . Fibrocystic breast disease   . GERD (gastroesophageal reflux disease)   . History of hiatal hernia    repaired  . History of kidney stones    questionable  . Hyperlipidemia   . Hypertension    pt denies  . Hypothyroidism   . Irritable bowel syndrome   . Ovarian cyst   . Pancreatitis   . PONV (postoperative nausea and vomiting)    NO TROUBLE WITH CATARACT SURGERY  . Psoriasis   . Psoriatic arthritis ([place]) 05/12/2016   Poor response to Humira Inadequate response to Enbrel Inadequate response to Simponi  . Tachycardia   . Ulcerative colitis     Family History  Problem Relation Age of Onset  . Breast cancer Mother 31  . Heart disease Mother   . Colon polyps Mother   . Other Mother        pre-cancerous tumor removed  . Colon cancer Mother   . Heart disease Father   . Crohn's disease Sister   . Alcoholism Sister   . Irritable bowel syndrome Other   . Crohn's disease Daughter   . Breast cancer Paternal Aunt   . Breast cancer Maternal Grandmother   . Esophageal cancer Neg Hx   . Rectal cancer Neg Hx   . Stomach cancer Neg Hx   . Pancreatic cancer Neg Hx    Past Surgical History:  Procedure Laterality Date  . ABDOMINAL HYSTERECTOMY    . ANKLE RECONSTRUCTION     left  . bladder tuck    . CATARACT EXTRACTION W/PHACO Right 09/11/2017   Procedure: CATARACT EXTRACTION PHACO AND INTRAOCULAR LENS PLACEMENT (IOC);  Surgeon: Birder Robson, MD;  Location: [facility];  Service: Ophthalmology;  Laterality: Right;  [place] 00:18.5 AP% 6.9 CDE 1.29 Fluid Pack Lot # [id number]  . CATARACT EXTRACTION W/PHACO Left 10/02/2017   Procedure: CATARACT  EXTRACTION PHACO AND INTRAOCULAR LENS PLACEMENT ([place]);  Surgeon: Birder Robson, MD;  Location: [facility];  Service: Ophthalmology;  Laterality: Left;  [place] 00:25.9 AP% 6.9 CDE 1.80 Fluid Pack Lot # [id number]  . CLOSED REDUCTION NASAL FRACTURE N/A 05/21/2018   Procedure: CLOSED REDUCTION NASAL FRACTURE;  Surgeon: Clyde Canterbury, MD;  Location: [facility];  Service: ENT;  Laterality: N/A;  Latex sensitivity  . COLONOSCOPY    . EYE SURGERY     Lasik  . FRACTURE SURGERY    . KNEE SURGERY     left  . LAPAROSCOPIC NISSEN FUNDOPLICATION N/A 4/54/0981   Procedure: LAPAROSCOPIC NISSEN TAKEDOWN AND UPPER ENDOSCOPY;  Surgeon: Johnathan Hausen, MD;  Location: [facility];  Service: General;  Laterality: N/A;  . NASAL SEPTUM SURGERY    . NISSEN FUNDOPLICATION    . PARTIAL HYSTERECTOMY    . POLYPECTOMY    . repair cystocele and rectocele    . thyroid ablation    . TUBAL LIGATION    . UPPER GASTROINTESTINAL ENDOSCOPY    .  UPPER GI ENDOSCOPY  01/12/2017   Procedure: UPPER GI ENDOSCOPY;  Surgeon: Johnathan Hausen, MD;  Location: [facility];  Service: General;;   Social History   Social History Narrative  . Not on file   Immunization History  Administered Date(s) Administered  . Influenza Inj Mdck Quad Pf 05/08/2017  . Influenza,inj,Quad PF,6+ Mos 03/08/2018     Objective: Vital Signs: BP 126/69 (BP Location: Left Arm, Patient Position: Sitting, Cuff Size: Normal)   Pulse 76   Resp 16   Ht 5' 6"  (1.676 m)   Wt 213 lb 12.8 oz (97 kg)   BMI 34.51 kg/m    Physical Exam Vitals signs and nursing note reviewed.  Constitutional:      Appearance: She is well-developed.  HENT:     Head: Normocephalic and atraumatic.  Eyes:     Conjunctiva/sclera: Conjunctivae normal.  Neck:     Musculoskeletal: Normal range of motion.  Cardiovascular:     Rate and Rhythm: Normal rate and regular rhythm.     Heart sounds: Normal heart sounds.  Pulmonary:     Effort: Pulmonary effort is normal.     Breath  sounds: Normal breath sounds.  Abdominal:     General: Bowel sounds are normal.     Palpations: Abdomen is soft.  Lymphadenopathy:     Cervical: No cervical adenopathy.  Skin:    General: Skin is warm and dry.     Capillary Refill: Capillary refill takes less than 2 seconds.     Comments: Psoriasis on the plantar aspect of both feet  Neurological:     Mental Status: She is alert and oriented to person, place, and time.  Psychiatric:        Behavior: Behavior normal.      Musculoskeletal Exam: C-spine, thoracic spine, lumbar spine good range of motion.  No midline spinal tenderness.  She has left SI joint tenderness.  Shoulder joints, elbow joints, wrist joints, MCPs, PIPs, DIPs good range of motion no synovitis.  She has mild PIP and DIP synovial thickening consistent with osteoarthritis of bilateral hands.  She has tenderness of the left first PIP joint.  Hip joints, knee joints, ankle joints, MTPs, PIPs and DIPs good range of motion with no synovitis.  She has tenderness of the left first MTP joint but no synovitis was noted.  No warmth or effusion of bilateral knee joints.  No tenderness or swelling of ankle joints.  No tenderness over trochanteric bursa bilaterally.  CDAI Exam: CDAI Score: 1.4  Patient Global: 2 mm; Provider Global: 2 mm Swollen: 0 ; Tender: 2  Joint Exam      Right  Left  IP      Tender  Sacroiliac      Tender     Investigation: No additional findings.  Imaging: No results found.  Recent Labs: Lab Results  Component Value Date   WBC 7.5 08/22/2018   HGB 13.3 08/22/2018   PLT 259 08/22/2018   NA 139 08/22/2018   K 4.5 08/22/2018   CL 103 08/22/2018   CO2 28 08/22/2018   GLUCOSE 84 08/22/2018   BUN 22 08/22/2018   CREATININE 0.74 08/22/2018   BILITOT 0.4 08/22/2018   ALKPHOS 68 01/20/2018   AST 16 08/22/2018   ALT 15 08/22/2018   PROT 7.1 08/22/2018   ALBUMIN 3.8 01/20/2018   CALCIUM 9.2 08/22/2018   GFRAA 108 08/22/2018   QFTBGOLDPLUS  Negative 10/15/2018    Speciality Comments: No specialty comments available.  Procedures:  No procedures performed Allergies: Imuran [azathioprine], Meperidine hcl, Remicade [infliximab], Sulfasalazine, Avelox [moxifloxacin], Doxycycline, Erythromycin, and Latex   Assessment / Plan:     Visit Diagnoses:  1. Psoriatic arthritis ([place]): She has no synovitis or dactylitis on exam.  She has not had any recent psoriatic arthritis flares.  She is clinically doing well on Xeljanz 5 mg 1 tablet by mouth twice daily, methotrexate 1 mL every week, and folic acid 2 mg by mouth daily.  She has not missed any doses of these medications recently.  She denies any recent infections.  She feels at this combination therapy has been very effective.  She does have some left SI joint tenderness on exam today.  She has intermittent pain in bilateral hands but no joint swelling was noted.  She has mild PIP and DIP synovial thickening on exam.  She has psoriasis on the plantar aspect of both feet and has been using clobetasol topically.  She has no Achilles tendinitis or plantar fasciitis at this time.  She will continue on the current treatment regimen.  She needs a refill of methotrexate today.  She was advised to notify us if she develops increased joint pain or joint swelling.  She will follow-up in the office in 5 months.  2. Psoriasis: She currently has psoriasis on the plantar aspect of both feet.  She has been applying clobetasol topically as needed.   3. High risk medication OHF:GBMSXJD 5 mg twice daily, methotrexate 1 mL every 7 days, and folic acid 1 mg 2 tablets daily.  Last TB gold negative on 10/15/2018 and will monitor yearly.  Last lipid panel within normal limits on 05/07/2018 and will monitor yearly.  She is on Crestor 20 mg managed by her PCP.  Most recent CBC/CMP within normal limits on 08/22/2018.  She is overdue for labs.  CBC/CMP ordered for today and will monitor every 3 months.  Standing orders placed.   She received the flu vaccine last August.    4. Primary osteoarthritis of both knees   5. DDD (degenerative disc disease), lumbar: She has no midline spinal tenderness.   6. Spondylosis of lumbar region without myelopathy or radiculopathy: She has no midline spinal tenderness.  She has no symptoms of radiculopathy.   7. History of ulcerative colitis: She is taking Xeljanz 5 mg BID.    8. History of hypertension   9. History of IBS   10. History of gastroesophageal reflux (GERD)   11. Vitamin D deficiency      Orders: Orders Placed This Encounter  Procedures  . CMP14+EGFR  . CBC with Differential/Platelet  . COMPLETE METABOLIC PANEL WITH GFR  . CBC with Differential/Platelet   No orders of the defined types were placed in this encounter.   Follow-Up Instructions: Return in about 5 months (around 07/06/2019) for Psoriatic arthritis, Osteoarthritis, DDD.   Ofilia Neas, PA-C  Note - This record has been created using Dragon software.  Chart creation errors have been sought, but may not always  have been located. Such creation errors do not reflect on  the standard of medical care.

## 2019-01-23 ENCOUNTER — Other Ambulatory Visit: Payer: Self-pay | Admitting: Rheumatology

## 2019-01-23 NOTE — Telephone Encounter (Addendum)
Last visit: 05/07/18 Next visit: 02/03/19 Labs: 08/22/18 WNL   Patient will update labs at appointment on 02/03/19.   Okay to refill 30 day supply per Dr. Estanislado Pandy

## 2019-02-03 ENCOUNTER — Ambulatory Visit: Payer: Medicare HMO | Admitting: Physician Assistant

## 2019-02-03 ENCOUNTER — Encounter: Payer: Self-pay | Admitting: Physician Assistant

## 2019-02-03 ENCOUNTER — Other Ambulatory Visit: Payer: Self-pay

## 2019-02-03 VITALS — BP 126/69 | HR 76 | Resp 16 | Ht 66.0 in | Wt 213.8 lb

## 2019-02-03 DIAGNOSIS — M51369 Other intervertebral disc degeneration, lumbar region without mention of lumbar back pain or lower extremity pain: Secondary | ICD-10-CM

## 2019-02-03 DIAGNOSIS — M170 Bilateral primary osteoarthritis of knee: Secondary | ICD-10-CM | POA: Diagnosis not present

## 2019-02-03 DIAGNOSIS — Z79899 Other long term (current) drug therapy: Secondary | ICD-10-CM | POA: Diagnosis not present

## 2019-02-03 DIAGNOSIS — M5136 Other intervertebral disc degeneration, lumbar region: Secondary | ICD-10-CM | POA: Diagnosis not present

## 2019-02-03 DIAGNOSIS — Z8719 Personal history of other diseases of the digestive system: Secondary | ICD-10-CM

## 2019-02-03 DIAGNOSIS — E559 Vitamin D deficiency, unspecified: Secondary | ICD-10-CM | POA: Diagnosis not present

## 2019-02-03 DIAGNOSIS — L409 Psoriasis, unspecified: Secondary | ICD-10-CM | POA: Diagnosis not present

## 2019-02-03 DIAGNOSIS — M47816 Spondylosis without myelopathy or radiculopathy, lumbar region: Secondary | ICD-10-CM | POA: Diagnosis not present

## 2019-02-03 DIAGNOSIS — L4050 Arthropathic psoriasis, unspecified: Secondary | ICD-10-CM | POA: Diagnosis not present

## 2019-02-03 DIAGNOSIS — Z8679 Personal history of other diseases of the circulatory system: Secondary | ICD-10-CM

## 2019-02-03 MED ORDER — METHOTREXATE SODIUM CHEMO INJECTION 50 MG/2ML: INTRAMUSCULAR | 0 refills | Status: DC

## 2019-02-03 NOTE — Patient Instructions (Signed)
Standing Labs We placed an order today for your standing lab work.    Please come back and get your standing labs in October and every 3 months   We have open lab daily Monday through Thursday from 8:30-12:30 PM and 1:30-4:30 PM and Friday from 8:30-12:30 PM and 1:30 -4:00 PM at the office of Dr. Bo Merino.   You may experience shorter wait times on Monday and Friday afternoons. The office is located at [address] [place], [place], [place] [address] No appointment is necessary.   Labs are drawn by [facility].  You may receive a bill from [facility] for your lab work.  If you wish to have your labs drawn at another location, please call the office 24 hours in advance to send orders.  If you have any questions regarding directions or hours of operation,  please call [phone].   Just as a reminder please drink plenty of water prior to coming for your lab work. Thanks!

## 2019-02-04 ENCOUNTER — Other Ambulatory Visit: Payer: Self-pay | Admitting: Rheumatology

## 2019-02-04 ENCOUNTER — Other Ambulatory Visit: Payer: Self-pay | Admitting: Gastroenterology

## 2019-02-04 LAB — CBC WITH DIFFERENTIAL/PLATELET
Absolute Monocytes: 585 cells/uL (ref 200–950)
Basophils Absolute: 38 cells/uL (ref 0–200)
Basophils Relative: 0.5 %
Eosinophils Absolute: 60 cells/uL (ref 15–500)
Eosinophils Relative: 0.8 %
HCT: 39.2 % (ref 35.0–45.0)
Hemoglobin: 13 g/dL (ref 11.7–15.5)
Lymphs Abs: 2663 cells/uL (ref 850–3900)
MCH: 31.9 pg (ref 27.0–33.0)
MCHC: 33.2 g/dL (ref 32.0–36.0)
MCV: 96.3 fL (ref 80.0–100.0)
MPV: 10.6 fL (ref 7.5–12.5)
Monocytes Relative: 7.8 %
Neutro Abs: 4155 cells/uL (ref 1500–7800)
Neutrophils Relative %: 55.4 %
Platelets: 239 10*3/uL (ref 140–400)
RBC: 4.07 10*6/uL (ref 3.80–5.10)
RDW: 13 % (ref 11.0–15.0)
Total Lymphocyte: 35.5 %
WBC: 7.5 10*3/uL (ref 3.8–10.8)

## 2019-02-04 LAB — COMPLETE METABOLIC PANEL WITH GFR
AG Ratio: 1.4 (calc) (ref 1.0–2.5)
ALT: 22 U/L (ref 6–29)
AST: 24 U/L (ref 10–35)
Albumin: 4.1 g/dL (ref 3.6–5.1)
Alkaline phosphatase (APISO): 58 U/L (ref 37–153)
BUN: 15 mg/dL (ref 7–25)
CO2: 29 mmol/L (ref 20–32)
Calcium: 9 mg/dL (ref 8.6–10.4)
Chloride: 102 mmol/L (ref 98–110)
Creat: 0.75 mg/dL (ref 0.50–1.05)
GFR, Est African American: 105 mL/min/{1.73_m2} (ref >=60)
GFR, Est Non African American: 91 mL/min/{1.73_m2} (ref >=60)
Globulin: 2.9 g/dL (calc) (ref 1.9–3.7)
Glucose, Bld: 86 mg/dL (ref 65–99)
Potassium: 4.5 mmol/L (ref 3.5–5.3)
Sodium: 139 mmol/L (ref 135–146)
Total Bilirubin: 0.6 mg/dL (ref 0.2–1.2)
Total Protein: 7 g/dL (ref 6.1–8.1)

## 2019-02-04 NOTE — Progress Notes (Signed)
CBC and CMP WNL

## 2019-02-05 NOTE — Telephone Encounter (Signed)
Last Visit: 02/03/19 Next Visit: 07/07/19 Labs: 02/03/19 WNL  Okay to refill per Dr. Estanislado Pandy

## 2019-04-03 DIAGNOSIS — E039 Hypothyroidism, unspecified: Secondary | ICD-10-CM | POA: Diagnosis not present

## 2019-04-03 DIAGNOSIS — E7800 Pure hypercholesterolemia, unspecified: Secondary | ICD-10-CM | POA: Diagnosis not present

## 2019-04-03 DIAGNOSIS — I10 Essential (primary) hypertension: Secondary | ICD-10-CM | POA: Diagnosis not present

## 2019-04-07 DIAGNOSIS — E039 Hypothyroidism, unspecified: Secondary | ICD-10-CM | POA: Diagnosis not present

## 2019-04-07 DIAGNOSIS — J449 Chronic obstructive pulmonary disease, unspecified: Secondary | ICD-10-CM | POA: Diagnosis not present

## 2019-04-07 DIAGNOSIS — K5190 Ulcerative colitis, unspecified, without complications: Secondary | ICD-10-CM | POA: Diagnosis not present

## 2019-04-07 DIAGNOSIS — F322 Major depressive disorder, single episode, severe without psychotic features: Secondary | ICD-10-CM | POA: Diagnosis not present

## 2019-04-07 DIAGNOSIS — E7800 Pure hypercholesterolemia, unspecified: Secondary | ICD-10-CM | POA: Diagnosis not present

## 2019-04-07 DIAGNOSIS — L4050 Arthropathic psoriasis, unspecified: Secondary | ICD-10-CM | POA: Diagnosis not present

## 2019-04-07 DIAGNOSIS — I10 Essential (primary) hypertension: Secondary | ICD-10-CM | POA: Diagnosis not present

## 2019-04-07 DIAGNOSIS — E669 Obesity, unspecified: Secondary | ICD-10-CM | POA: Diagnosis not present

## 2019-04-07 DIAGNOSIS — Z0000 Encounter for general adult medical examination without abnormal findings: Secondary | ICD-10-CM | POA: Diagnosis not present

## 2019-04-24 DIAGNOSIS — I10 Essential (primary) hypertension: Secondary | ICD-10-CM | POA: Diagnosis not present

## 2019-04-26 ENCOUNTER — Other Ambulatory Visit: Payer: Self-pay | Admitting: Physician Assistant

## 2019-04-28 NOTE — Telephone Encounter (Signed)
Last Visit: 02/03/19 Next Visit: 07/07/19 Labs: 02/03/19 WNL  Okay to refill per Dr. Estanislado Pandy

## 2019-05-05 ENCOUNTER — Other Ambulatory Visit: Payer: Self-pay | Admitting: Rheumatology

## 2019-05-05 DIAGNOSIS — Z79899 Other long term (current) drug therapy: Secondary | ICD-10-CM

## 2019-05-05 NOTE — Telephone Encounter (Signed)
Last Visit: 02/03/19 Next Visit: 07/07/19 Labs: 02/03/19 WNL  Patient advised she is due for labs this month. Lab order released.   Okay to refill per Dr. Estanislado Pandy

## 2019-05-07 ENCOUNTER — Other Ambulatory Visit: Payer: Self-pay | Admitting: Rheumatology

## 2019-05-08 DIAGNOSIS — Z79899 Other long term (current) drug therapy: Secondary | ICD-10-CM | POA: Diagnosis not present

## 2019-05-09 LAB — CBC WITH DIFFERENTIAL/PLATELET
Basophils Absolute: 0 10*3/uL (ref 0.0–0.2)
Basos: 0 %
EOS (ABSOLUTE): 0.1 10*3/uL (ref 0.0–0.4)
Eos: 1 %
Hematocrit: 40.4 % (ref 34.0–46.6)
Hemoglobin: 13.4 g/dL (ref 11.1–15.9)
Immature Grans (Abs): 0 10*3/uL (ref 0.0–0.1)
Immature Granulocytes: 0 %
Lymphocytes Absolute: 2.4 10*3/uL (ref 0.7–3.1)
Lymphs: 32 %
MCH: 32.1 pg (ref 26.6–33.0)
MCHC: 33.2 g/dL (ref 31.5–35.7)
MCV: 97 fL (ref 79–97)
Monocytes Absolute: 0.6 10*3/uL (ref 0.1–0.9)
Monocytes: 8 %
Neutrophils Absolute: 4.2 10*3/uL (ref 1.4–7.0)
Neutrophils: 59 %
Platelets: 239 10*3/uL (ref 150–450)
RBC: 4.18 x10E6/uL (ref 3.77–5.28)
RDW: 12.2 % (ref 11.7–15.4)
WBC: 7.3 10*3/uL (ref 3.4–10.8)

## 2019-05-09 LAB — CMP14+EGFR
ALT: 19 IU/L (ref 0–32)
AST: 21 IU/L (ref 0–40)
Albumin/Globulin Ratio: 1.8 (ref 1.2–2.2)
Albumin: 4.6 g/dL (ref 3.8–4.9)
Alkaline Phosphatase: 59 IU/L (ref 39–117)
BUN/Creatinine Ratio: 19 (ref 9–23)
BUN: 17 mg/dL (ref 6–24)
Bilirubin Total: 0.4 mg/dL (ref 0.0–1.2)
CO2: 26 mmol/L (ref 20–29)
Calcium: 9.9 mg/dL (ref 8.7–10.2)
Chloride: 100 mmol/L (ref 96–106)
Creatinine, Ser: 0.88 mg/dL (ref 0.57–1.00)
GFR calc Af Amer: 87 mL/min/{1.73_m2} (ref >59)
GFR calc non Af Amer: 75 mL/min/{1.73_m2} (ref >59)
Globulin, Total: 2.5 g/dL (ref 1.5–4.5)
Glucose: 99 mg/dL (ref 65–99)
Potassium: 5.1 mmol/L (ref 3.5–5.2)
Sodium: 140 mmol/L (ref 134–144)
Total Protein: 7.1 g/dL (ref 6.0–8.5)

## 2019-05-09 NOTE — Telephone Encounter (Signed)
CBC and CMP WNL

## 2019-06-25 DIAGNOSIS — Z961 Presence of intraocular lens: Secondary | ICD-10-CM | POA: Diagnosis not present

## 2019-06-26 ENCOUNTER — Other Ambulatory Visit: Payer: Self-pay

## 2019-06-26 ENCOUNTER — Ambulatory Visit (INDEPENDENT_AMBULATORY_CARE_PROVIDER_SITE_OTHER): Payer: Medicare HMO | Admitting: Emergency Medicine

## 2019-06-26 ENCOUNTER — Ambulatory Visit: Payer: Medicare HMO | Admitting: Gastroenterology

## 2019-06-26 ENCOUNTER — Encounter: Payer: Self-pay | Admitting: Emergency Medicine

## 2019-06-26 DIAGNOSIS — J449 Chronic obstructive pulmonary disease, unspecified: Secondary | ICD-10-CM | POA: Diagnosis not present

## 2019-06-26 MED ORDER — SPIRIVA RESPIMAT 2.5 MCG/ACT IN AERS: 2.0000 | INHALATION_SPRAY | Freq: Every day | RESPIRATORY_TRACT | 12 refills | Status: DC

## 2019-06-26 NOTE — Progress Notes (Signed)
Virtual Visit via Telephone Note  I connected with [name] on 06/26/19 at 10:45 AM EST by telephone and verified that I am speaking with the correct person using two identifiers.  Location: Patient: Home Provider: Office   I discussed the limitations, risks, security and privacy concerns of performing an evaluation and management service by telephone and the availability of in person appointments. I also discussed with the patient that there may be a patient responsible charge related to this service. The patient expressed understanding and agreed to proceed.   History of Present Illness: 53 year old former smoker with COPD and mild OSA (not on CPAP, unable to tolerate), allergic rhinitis, GERD and chronic cough.  She also has psoriatic arthritis, ulcerative colitis on methotrexate and Xeljanz.   Observations/Objective: Her sleep is "OK", she does still snore.  She has stable but significant exertional SOB with walking through the yard, with any hills. She uses her albuterol at those times. She is able to shop but has to stop to rest. Able to do ADL. 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 She hears wheezed when SOB from exercise, also in the am. She has congestion and cough remains on xyzal   Assessment and Plan: COPD w Increased exertional SOB - believe it is time for a therapeutic trial of scheduled BD  Continue current allergy regimen.  Trial Spiriva Respimat qd to see if she benefits.  Albuterol prn Discussed slow stead increase in her exercise with her today Follow up Ct chest sometime coming year given her auto-immune disease, MTX use.   Follow Up Instructions: 1 month by phone.    I discussed the assessment and treatment plan with the patient. The patient was provided an opportunity to ask questions and all were answered. The patient agreed with the plan and demonstrated an understanding of the instructions.   The patient was advised to call back or seek an in-person evaluation if the  symptoms worsen or if the condition fails to improve as anticipated.  I provided 15 minutes of non-face-to-face time during this encounter.   Collene Gobble, MD

## 2019-07-02 ENCOUNTER — Encounter: Payer: Self-pay | Admitting: Rheumatology

## 2019-07-02 ENCOUNTER — Telehealth (INDEPENDENT_AMBULATORY_CARE_PROVIDER_SITE_OTHER): Payer: Medicare HMO | Admitting: Rheumatology

## 2019-07-02 ENCOUNTER — Other Ambulatory Visit: Payer: Self-pay

## 2019-07-02 ENCOUNTER — Other Ambulatory Visit: Payer: Self-pay | Admitting: *Deleted

## 2019-07-02 DIAGNOSIS — M170 Bilateral primary osteoarthritis of knee: Secondary | ICD-10-CM | POA: Diagnosis not present

## 2019-07-02 DIAGNOSIS — M47816 Spondylosis without myelopathy or radiculopathy, lumbar region: Secondary | ICD-10-CM

## 2019-07-02 DIAGNOSIS — L4050 Arthropathic psoriasis, unspecified: Secondary | ICD-10-CM | POA: Diagnosis not present

## 2019-07-02 DIAGNOSIS — E559 Vitamin D deficiency, unspecified: Secondary | ICD-10-CM | POA: Diagnosis not present

## 2019-07-02 DIAGNOSIS — L409 Psoriasis, unspecified: Secondary | ICD-10-CM

## 2019-07-02 DIAGNOSIS — Z79899 Other long term (current) drug therapy: Secondary | ICD-10-CM | POA: Diagnosis not present

## 2019-07-02 DIAGNOSIS — Z8679 Personal history of other diseases of the circulatory system: Secondary | ICD-10-CM | POA: Diagnosis not present

## 2019-07-02 DIAGNOSIS — M5136 Other intervertebral disc degeneration, lumbar region: Secondary | ICD-10-CM | POA: Diagnosis not present

## 2019-07-02 DIAGNOSIS — Z8719 Personal history of other diseases of the digestive system: Secondary | ICD-10-CM

## 2019-07-02 DIAGNOSIS — M7661 Achilles tendinitis, right leg: Secondary | ICD-10-CM

## 2019-07-02 DIAGNOSIS — M7662 Achilles tendinitis, left leg: Secondary | ICD-10-CM

## 2019-07-02 MED ORDER — PREDNISONE 5 MG PO TABS: ORAL_TABLET | ORAL | 0 refills | Status: DC

## 2019-07-02 NOTE — Progress Notes (Signed)
Virtual Visit via Telephone Note  I connected with [name] on 07/02/19 at  3:15 PM EST by telephone and verified that I am speaking with the correct person using two identifiers.  Location: Patient: Home Provider: Clinic  This service was conducted via virtual visit. The patient was located at home. I was located in my office.  Consent was obtained prior to the virtual visit and is aware of possible charges through their insurance for this visit.  The patient is an established patient.  Dr. Estanislado Pandy, MD conducted the virtual visit and Hazel Sams, PA-C acted as scribe during the service.  Office staff helped with scheduling follow up visits after the service was conducted.   I discussed the limitations, risks, security and privacy concerns of performing an evaluation and management service by telephone and the availability of in person appointments. I also discussed with the patient that there may be a patient responsible charge related to this service. The patient expressed understanding and agreed to proceed.  CC: Achilles tendonitis bilaterally  History of Present Illness: Patient is a 53 year old female with a past medical history of psoriatic arthritis, osteoarthritis, and DDD.  Patient is on Xeljanz 5 mg twice daily, methotrexate 1 mL every week, and folic acid 2 mg by mouth daily.  She has ongoing left SI joint pain.  She has been experiencing achilles tendonitis bilaterally.  She experiences stiffness in both achilles tendons when getting out of bed in the morning.  She denies any joint swelling currently.   She has psoriasis on the plantar aspect of both feet.   Review of Systems  Constitutional: Negative for fever and malaise/fatigue.  Eyes: Negative for photophobia, pain, discharge and redness.  Respiratory: Negative for cough, shortness of breath and wheezing.   Cardiovascular: Negative for chest pain and palpitations.  Gastrointestinal: Negative for blood in stool,  constipation and diarrhea.  Genitourinary: Negative for dysuria.  Musculoskeletal: Negative for back pain, joint pain, myalgias and neck pain.  Skin: Negative for rash.  Neurological: Negative for dizziness and headaches.  Psychiatric/Behavioral: Negative for depression. The patient is not nervous/anxious and does not have insomnia.       Observations/Objective: Physical Exam  Constitutional: She is oriented to person, place, and time.  Neurological: She is alert and oriented to person, place, and time.  Psychiatric: Mood, memory, affect and judgment normal.   Patient reports morning stiffness for 30-45  minutes.   Patient denies nocturnal pain.  Difficulty dressing/grooming: Denies Difficulty climbing stairs: Reports Difficulty getting out of chair: Reports Difficulty using hands for taps, buttons, cutlery, and/or writing: Denies   Assessment and Plan: Visit Diagnoses:  1. Psoriatic arthritis ([place]): She has ongoing discomfort in the left SI joint.  She experiences morning stiffness for 30-45 minutes daily. She has been experiencing achilles tendonitis bilaterally for the past 1 month.  She denies any plantar fasciitis but she has ongoing psoriasis on the plantar aspect of both feet.  She is not having any other joint pain or inflammation at this time.  She is taking Xeljanz 5 mg 2 tablet daily, MTX 1 ml sq once weekly, and folic acid 2 mg po daily.  She has not missed any doses recently.  We will send in a prednisone taper starting at 20 mg tapering by 5 mg every 4 days. She was advised to notify us if her symptoms persist or worsen after the prednisone taper is complete.  She will follow up in 3-4 months.  2. Psoriasis: She has psoriasis on the plantar aspect of both feet.    3. High risk medication EBR:AXENMMH 5 mg twice daily, methotrexate 1 mL every 7 days, and folic acid 1 mg 2 tablets daily.  Last TB gold negative on 10/15/2018 and will monitor yearly.  Last lipid panel within  normal limits on 05/07/2018 and will monitor yearly.  She is on Crestor 20 mg managed by her PCP. CBC and CMP WNL on 05/08/19.  She will be due to update lab work in January and every 3 months.    Achilles tendonitis, bilateral: She has been experiencing achilles tendonitis bilaterally for the past 1 month.  She experiences significant stiffness first thing in the morning.  We will send in a prednisone taper starting at 20 mg tapering by 5 mg every 4 days.  4. Primary osteoarthritis of both knees: She is not having any increased knee joint pain or inflammation at this time.  She has occasional difficulty climbing steps due to the discomfort.   5. DDD (degenerative disc disease), lumbar:   6. Spondylosis of lumbar region without myelopathy or radiculopathy:   7. History of ulcerative colitis: She is taking Xeljanz 5 mg BID.  She has not had any recent UC flares.    8. History of hypertension   9. History of IBS   10. History of gastroesophageal reflux (GERD)   11. Vitamin D deficiency       Follow Up Instructions: She will follow up in 3-4 months   I discussed the assessment and treatment plan with the patient. The patient was provided an opportunity to ask questions and all were answered. The patient agreed with the plan and demonstrated an understanding of the instructions.   The patient was advised to call back or seek an in-person evaluation if the symptoms worsen or if the condition fails to improve as anticipated.  I provided 25 minutes of non-face-to-face time during this encounter.   Bo Merino, MD   Scribed by-  Hazel Sams, PA-C

## 2019-07-07 ENCOUNTER — Other Ambulatory Visit: Payer: Self-pay | Admitting: Physician Assistant

## 2019-07-07 ENCOUNTER — Ambulatory Visit: Payer: Self-pay | Admitting: Rheumatology

## 2019-07-07 NOTE — Telephone Encounter (Signed)
Last Visit: 07/02/2019 telemedicine  Next Visit: message sent to the front desk to schedule.  Labs: 05/08/2019 CBC and CMP WNL   Okay to refill per Dr. Estanislado Pandy.

## 2019-07-23 ENCOUNTER — Other Ambulatory Visit: Payer: Self-pay | Admitting: Rheumatology

## 2019-07-24 NOTE — Telephone Encounter (Signed)
Last Visit: 07/02/2019 telemedicine  Next Visit: 11/03/19 Labs: 05/08/19 WNL  Okay to refill per Dr. Estanislado Pandy

## 2019-07-28 ENCOUNTER — Telehealth: Payer: Self-pay | Admitting: Gastroenterology

## 2019-07-28 NOTE — Telephone Encounter (Signed)
Dr Silverio Decamp I looked at this Her risk for COVID is a 4  Which is ok?? But its for a UC Follow up  Can she be Virtual ?

## 2019-07-29 ENCOUNTER — Other Ambulatory Visit: Payer: Self-pay | Admitting: Family Medicine

## 2019-07-29 DIAGNOSIS — Z1231 Encounter for screening mammogram for malignant neoplasm of breast: Secondary | ICD-10-CM

## 2019-07-29 NOTE — Telephone Encounter (Signed)
Ok to switch to virtual visit. Thanks

## 2019-07-29 NOTE — Telephone Encounter (Signed)
Scheduled pt for virtual

## 2019-07-30 ENCOUNTER — Telehealth: Payer: Self-pay | Admitting: Rheumatology

## 2019-07-30 ENCOUNTER — Encounter: Payer: Self-pay | Admitting: Gastroenterology

## 2019-07-30 ENCOUNTER — Ambulatory Visit (INDEPENDENT_AMBULATORY_CARE_PROVIDER_SITE_OTHER): Payer: Medicare HMO | Admitting: Gastroenterology

## 2019-07-30 VITALS — Ht 66.0 in

## 2019-07-30 DIAGNOSIS — K588 Other irritable bowel syndrome: Secondary | ICD-10-CM

## 2019-07-30 DIAGNOSIS — K5100 Ulcerative (chronic) pancolitis without complications: Secondary | ICD-10-CM | POA: Diagnosis not present

## 2019-07-30 DIAGNOSIS — K219 Gastro-esophageal reflux disease without esophagitis: Secondary | ICD-10-CM

## 2019-07-30 NOTE — Patient Instructions (Addendum)
Please change recall colonoscopy for surveillance for May 2022  Refill Zofran 4 mg daily as needed X 30 tablets and dicyclomine 20 mg every 8 hours as needed X 90 tablets  Refill omeprazole 20 mg daily  Return office visit in 1 year or sooner if needed  I appreciate the  opportunity to care for you  Thank You   Harl Bowie , MD

## 2019-07-30 NOTE — Progress Notes (Signed)
[name]    [id number]    1965/09/07  Primary Care Physician:Shaw, Nathen May, MD  Referring Physician: Mayra Neer, MD [address] [place],  [place] [address]  This service was provided via  telemedicine due to [place] 19 pandemic.  I connected with@ on 07/30/19 at  8:10 AM EST by a video enabled telemedicine application and verified that I am speaking with the correct person using two identifiers.  Patient location: Home Provider location: Office   I discussed the limitations, risks, security and privacy concerns of performing an evaluation and management service by video enabled telemedicine application and the availability of in person appointments. I also discussed with the patient that there may be a patient responsible charge related to this service. The patient expressed understanding and agreed to proceed.   The persons participating in this telemedicine service were myself and the patient  Interactive audio and video telecommunications were established between this provider and patient, however failed, due to  having technical difficulties . We continued and completed visit with audio only.    Chief complaint: Ulcerative colitis HPI: 54 year old female with history of ulcerative colitis, psoriasis and psoriatic arthritis for follow-up visit.  Last office visit September 2019.  She is doing overall really well on Xeljanz. Denies any nausea, vomiting, abdominal pain, melena or bright red blood per rectum She is having 2 formed bowel movements daily and has no specific GI complaints.  No colitis flare since she was initiated on [place] She is on methotrexate for psoriatic arthritis and recently coming off prednisone taper for tendinitis.  Omeprazole 20 mg daily with good control of acid reflux symptoms.  Denies any dysphagia or odynophagia.  GI history Ulcerative colitis diagnosed in 2002, colonoscopy 2011 showed pancolitis.   Colonoscopy December 2014 showed chronic active colitis from 0 to 30 cm and normal transverse and right colon. Colonoscopy Nov 14, 2017 negative for active colitis  Chronic GERD status post Nissen fundoplication EGD February 2011 negative for Barrett's esophagus   Psoriasis and psoriatic arthritis managed by Dr. Patrecia Pour.  She failed Remicade, developed antibodies.  She did not respond to Humira, Enbrel and Simponi.  She was also on [place] with no significant improvement in addition to methotrexate weekly.  Outpatient Encounter Medications as of 07/30/2019  Medication Sig  . aspirin-acetaminophen-caffeine (EXCEDRIN MIGRAINE) 250-250-65 MG tablet Take 2 tablets by mouth every 8 (eight) hours as needed for headache.   . Calcium Carbonate-Vit D-Min (CALTRATE 600+D PLUS PO) Take 1 tablet by mouth daily.  Marland Kitchen dicyclomine (BENTYL) 20 MG tablet Take 1 tablet (20 mg total) by mouth 2 (two) times daily. (Patient taking differently: Take 20 mg by mouth as needed. )  . estradiol (ESTRACE) 1 MG tablet Take 1 mg by mouth daily.  . fluticasone (FLONASE) 50 MCG/ACT nasal spray Place 1 spray into both nostrils daily.   . folic acid (FOLVITE) 1 MG tablet Take 2 mg by mouth daily.  Marland Kitchen ibuprofen (ADVIL,MOTRIN) 200 MG tablet Take 400 mg by mouth every 6 (six) hours as needed for mild pain.   Marland Kitchen levocetirizine (XYZAL) 5 MG tablet Take 5 mg by mouth every evening.  Marland Kitchen levothyroxine (SYNTHROID, LEVOTHROID) 125 MCG tablet Take 125 mcg by mouth every other day.   . levothyroxine (SYNTHROID, LEVOTHROID) 137 MCG tablet Take 137 mcg by mouth every other day.  . methotrexate 50 MG/2ML injection INJECT 1 ML UNDER THE SKIN ONE TIME WEEKLY  (  DISCARD USED VIAL 28 DAYS AFTER OPENING)  . metoprolol succinate (TOPROL-XL) 50 MG 24 hr tablet Take 50 mg by mouth daily. Take with or immediately following a meal.  . montelukast (SINGULAIR) 10 MG tablet Take 10 mg by mouth at bedtime.  . Multiple Vitamins-Minerals (CENTRUM  SILVER ULTRA WOMENS PO) Take 1 tablet by mouth daily.  Marland Kitchen omeprazole (PRILOSEC) 20 MG capsule TAKE 1 CAPSULE EVERY DAY BEFORE BREAKFAST  . ondansetron (ZOFRAN) 4 MG tablet Take 1 tablet (4 mg total) by mouth every 8 (eight) hours as needed for nausea or vomiting.  . predniSONE (DELTASONE) 5 MG tablet Take 4 tabs po x 4 days, 3  tabs po x 4 days, 2  tabs po x 4 days, 1  tab po x 4 days  . rosuvastatin (CRESTOR) 20 MG tablet Take 20 mg by mouth daily.  . sertraline (ZOLOFT) 100 MG tablet Take 100 mg by mouth daily.  . Tiotropium Bromide Monohydrate (SPIRIVA RESPIMAT) 2.5 MCG/ACT AERS Inhale 2 puffs into the lungs daily.  . Tuberculin-Allergy Syringes 27G X 1/2" 1 ML KIT Inject 1 Syringe into the skin once a week. To be used with weekly methotrexate injections  . VENTOLIN HFA 108 (90 Base) MCG/ACT inhaler Inhale 2 puffs into the lungs every 6 (six) hours as needed for wheezing or shortness of breath.  Morrie Sheldon 5 MG TABS Take 1 tablet by mouth twice a day as directed by physician.   No facility-administered encounter medications on file as of 07/30/2019.    Allergies as of 07/30/2019 - Review Complete 07/02/2019  Allergen Reaction Noted  . Imuran [azathioprine] Other (See Comments) 08/24/2009  . Meperidine hcl Hives 08/24/2009  . Remicade [infliximab] Hives and Other (See Comments) 01/26/2011  . Sulfasalazine Swelling and Hives 08/04/2011  . Avelox [moxifloxacin] Rash 08/24/2009  . Doxycycline Rash   . Erythromycin Rash 08/24/2009  . Latex Rash 08/24/2009    Past Medical History:  Diagnosis Date  . Allergy   . Anxiety   . Arthritis   . Asthma   . Cataract    both eyes  surgically repaired  . Depression    treated  . Dyspnea   . Fibrocystic breast disease   . GERD (gastroesophageal reflux disease)   . History of hiatal hernia    repaired  . History of kidney stones    questionable  . Hyperlipidemia   . Hypertension    pt denies  . Hypothyroidism   . Irritable bowel syndrome     . Ovarian cyst   . Pancreatitis   . PONV (postoperative nausea and vomiting)    NO TROUBLE WITH CATARACT SURGERY  . Psoriasis   . Psoriatic arthritis ([place]) 05/12/2016   Poor response to Humira Inadequate response to Enbrel Inadequate response to Simponi  . Tachycardia   . Ulcerative colitis     Past Surgical History:  Procedure Laterality Date  . ABDOMINAL HYSTERECTOMY    . ANKLE RECONSTRUCTION     left  . bladder tuck    . CATARACT EXTRACTION W/PHACO Right 09/11/2017   Procedure: CATARACT EXTRACTION PHACO AND INTRAOCULAR LENS PLACEMENT (IOC);  Surgeon: Birder Robson, MD;  Location: [facility];  Service: Ophthalmology;  Laterality: Right;  [place] 00:18.5 AP% 6.9 CDE 1.29 Fluid Pack Lot # [id number]  . CATARACT EXTRACTION W/PHACO Left 10/02/2017   Procedure: CATARACT EXTRACTION PHACO AND INTRAOCULAR LENS PLACEMENT ([facility]);  Surgeon: Birder Robson, MD;  Location: [facility];  Service: Ophthalmology;  Laterality: Left;  [place] 00:25.9 AP%  6.9 CDE 1.80 Fluid Pack Lot # [id number]  . CLOSED REDUCTION NASAL FRACTURE N/A 05/21/2018   Procedure: CLOSED REDUCTION NASAL FRACTURE;  Surgeon: Clyde Canterbury, MD;  Location: [place];  Service: ENT;  Laterality: N/A;  Latex sensitivity  . COLONOSCOPY    . EYE SURGERY     Lasik  . FRACTURE SURGERY    . KNEE SURGERY     left  . LAPAROSCOPIC NISSEN FUNDOPLICATION N/A 9/40/7680   Procedure: LAPAROSCOPIC NISSEN TAKEDOWN AND UPPER ENDOSCOPY;  Surgeon: Johnathan Hausen, MD;  Location: [facility];  Service: General;  Laterality: N/A;  . NASAL SEPTUM SURGERY    . NISSEN FUNDOPLICATION    . PARTIAL HYSTERECTOMY    . POLYPECTOMY    . repair cystocele and rectocele    . thyroid ablation    . TUBAL LIGATION    . UPPER GASTROINTESTINAL ENDOSCOPY    . UPPER GI ENDOSCOPY  01/12/2017   Procedure: UPPER GI ENDOSCOPY;  Surgeon: Johnathan Hausen, MD;  Location: [facility];  Service: General;;    Family History  Problem Relation Age of Onset  . Breast cancer  Mother 35  . Heart disease Mother   . Colon polyps Mother   . Other Mother        pre-cancerous tumor removed  . Colon cancer Mother   . Heart disease Father   . Crohn's disease Sister   . Alcoholism Sister   . Irritable bowel syndrome Other   . Crohn's disease Daughter   . Breast cancer Paternal Aunt   . Breast cancer Maternal Grandmother   . Esophageal cancer Neg Hx   . Rectal cancer Neg Hx   . Stomach cancer Neg Hx   . Pancreatic cancer Neg Hx     Social History   Socioeconomic History  . Marital status: Married    Spouse name: Not on file  . Number of children: Not on file  . Years of education: Not on file  . Highest education level: Not on file  Occupational History  . 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 Occupation: disabled  Tobacco Use  . Smoking status: Former Smoker    Types: Cigarettes    Quit date: 07/27/2005    Years since quitting: 14.0  . Smokeless tobacco: Never Used  Substance and Sexual Activity  . Alcohol use: Yes    Alcohol/week: 0.0 standard drinks    Comment:  occasionally (1x/mo)  . Drug use: No  . Sexual activity: Yes  Other Topics Concern  . Not on file  Social History Narrative  . Not on file   Social Determinants of Health   Financial Resource Strain:   . Difficulty of Paying Living Expenses: Not on file  Food Insecurity:   . Worried About Charity fundraiser in the Last Year: Not on file  . Ran Out of Food in the Last Year: Not on file  Transportation Needs:   . Lack of Transportation (Medical): Not on file  . Lack of Transportation (Non-Medical): Not on file  Physical Activity:   . Days of Exercise per Week: Not on file  . Minutes of Exercise per Session: Not on file  Stress:   . Feeling of Stress : Not on file  Social Connections:   . Frequency of Communication with Friends and Family: Not on file  . Frequency of Social Gatherings with Friends and Family: Not on file  . Attends  Religious Services: Not  on file  . Active Member of Clubs or Organizations: Not on file  . Attends Archivist Meetings: Not on file  . Marital Status: Not on file  Intimate Partner Violence:   . Fear of Current or Ex-Partner: Not on file  . Emotionally Abused: Not on file  . Physically Abused: Not on file  . Sexually Abused: Not on file      Review of systems: Review of Systems as per HPI All other systems reviewed and are negative.   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  TB QuantiFERON gold negative March 2020, she is due for TB interferon gold next month, will get labs through Dr. Arlean Hopping office.  Change recall colonoscopy for surveillance to May 2022 (3 years from previous colonoscopy]  Will refill prescription for dicyclomine for intermittent IBS symptoms with abdominal cramping  Refill Zofran to use daily for as needed nausea  GERD: Continue omeprazole and antireflux measures  Return in 1 year or sooner if needed    I discussed the assessment and treatment plan with the patient. The patient was provided an opportunity to ask questions and all were answered. The patient agreed with the plan and demonstrated an understanding of the instructions.   The patient was advised to call back or seek an in-person evaluation if the symptoms worsen or if the condition fails to improve as anticipated.  I provided 32 minutes of non-face-to-face time during this encounter.   Harl Bowie, MD   CC: Mayra Neer, MD

## 2019-07-30 NOTE — Telephone Encounter (Signed)
#  1.Patient needs refill on Xeljanx sent to Patient assistance program. Patient is almost out of medication.  #2.Patient also saw Gastro doctor today, and she would like for Dr. Estanislado Pandy  to repeat TB Gold at the end of month to follow up on that. Please call patient to advise.

## 2019-07-31 NOTE — Telephone Encounter (Signed)
Spoke with patient and advised that a prescription was sent to the patient assistance program on 07/24/19. Patient states that was advised that she had to reapply. Patient states she has sent in her part. Patient advised she may come to the office to get a sample. Patient advised her TB Gold is due at the end of March and we can repeat it with her April labs so her insurance will cover it.  Rachael, can you look into her patient assistance? Thanks!

## 2019-07-31 NOTE — Addendum Note (Signed)
Addended by: Carole Binning on: 07/31/2019 12:45 PM   Modules accepted: Orders

## 2019-07-31 NOTE — Telephone Encounter (Signed)
Provider portion filled out and pending provider signature.   Mariella Saa, PharmD, [place], [facility] Pharmacist [phone]  07/31/2019 3:18 PM

## 2019-07-31 NOTE — Telephone Encounter (Signed)
Called [employer], Rep [name] advised that the Provider page fax on 06/20/19 did not come through clearly. Will need to send in a new provider page, then patient will need to call in and state that she has less than 10 days to expedite. Rep states they are not processing applications until each patient is due for refills. So patient must call to initiate the process.

## 2019-08-05 NOTE — Telephone Encounter (Signed)
Called [employer], they have received provider portion and full application is in process. Again requested for case to be expedited as patient needs refill scheduled. Called patient and advised. She will call them if they have not contacted her in the next day or so.  Phone# [phone]  11:09 AM [name], CPhT

## 2019-08-06 ENCOUNTER — Other Ambulatory Visit: Payer: Self-pay

## 2019-08-06 ENCOUNTER — Encounter: Payer: Self-pay | Admitting: Emergency Medicine

## 2019-08-06 ENCOUNTER — Ambulatory Visit (INDEPENDENT_AMBULATORY_CARE_PROVIDER_SITE_OTHER): Payer: Medicare HMO | Admitting: Emergency Medicine

## 2019-08-06 DIAGNOSIS — J449 Chronic obstructive pulmonary disease, unspecified: Secondary | ICD-10-CM | POA: Diagnosis not present

## 2019-08-06 DIAGNOSIS — Z720 Tobacco use: Secondary | ICD-10-CM | POA: Diagnosis not present

## 2019-08-06 DIAGNOSIS — Z87891 Personal history of nicotine dependence: Secondary | ICD-10-CM | POA: Insufficient documentation

## 2019-08-06 NOTE — Progress Notes (Signed)
Virtual Visit via Telephone Note  I connected with [name] on 08/06/19 at 10:30 AM EST by telephone and verified that I am speaking with the correct person using two identifiers.  Location: Patient: Home Provider: Office   I discussed the limitations, risks, security and privacy concerns of performing an evaluation and management service by telephone and the availability of in person appointments. I also discussed with the patient that there may be a patient responsible charge related to this service. The patient expressed understanding and agreed to proceed.   History of Present Illness: 54 year old woman with a history of COPD, mild obstructive sleep apnea (not on CPAP), allergic rhinitis with GERD and chronic cough.  She is on methotrexate and Xeljanz for psoriatic arthritis and ulcerative colitis.   Observations/Objective: We had a phone visit in December and at that time she was experiencing exertional dyspnea, stable but definitely impairing her functional capacity.  It did seem to respond to albuterol.  I started her on Spiriva Respimat to see if she would get benefit.  On follow-up today she reports that she has benefited > less wheeze in the am, able do house work. The wheeze is improved, she has still needed some albuterol, especially after climbing stairs, but not as much. She has moderate obstruction based on FEV1 from 04/19/18.   Assessment and Plan: COPD (chronic obstructive pulmonary disease) ([place]) [name] has been in a little bit of denial about having COPD and its clinical significance.  I think she is now more accepting.  She did the trial of Spiriva Respimat and did feel benefit.  She is willing to continue it.  She will keep albuterol available to use as needed.  Tobacco use Tobacco use, quit about 13 years ago, did smoke 30+ pack years.  She would be a candidate at least for the next 2 years for lung cancer screening.  We talked about this today and she is  interested.  I will refer her to our lung cancer screening program.    Follow Up Instructions: 6 months.    I discussed the assessment and treatment plan with the patient. The patient was provided an opportunity to ask questions and all were answered. The patient agreed with the plan and demonstrated an understanding of the instructions.   The patient was advised to call back or seek an in-person evaluation if the symptoms worsen or if the condition fails to improve as anticipated.  I provided 18 minutes of non-face-to-face time during this encounter.   Collene Gobble, MD

## 2019-08-06 NOTE — Assessment & Plan Note (Signed)
[name] has been in a little bit of denial about having COPD and its clinical significance.  I think she is now more accepting.  She did the trial of Spiriva Respimat and did feel benefit.  She is willing to continue it.  She will keep albuterol available to use as needed.

## 2019-08-06 NOTE — Assessment & Plan Note (Signed)
Tobacco use, quit about 13 years ago, did smoke 30+ pack years.  She would be a candidate at least for the next 2 years for lung cancer screening.  We talked about this today and she is interested.  I will refer her to our lung cancer screening program.

## 2019-08-15 NOTE — Telephone Encounter (Signed)
Received fax from [place], patient's renewal application has been APPROVED. Coverage dates are from 08/15/19 to 07/16/20.   Will send documents to [place].   Phone# [phone] Fax# [phone]

## 2019-08-19 ENCOUNTER — Telehealth: Payer: Self-pay | Admitting: Rheumatology

## 2019-08-19 ENCOUNTER — Other Ambulatory Visit: Payer: Self-pay | Admitting: *Deleted

## 2019-08-19 ENCOUNTER — Other Ambulatory Visit: Payer: Self-pay

## 2019-08-19 ENCOUNTER — Ambulatory Visit
Admission: RE | Admit: 2019-08-19 | Discharge: 2019-08-19 | Disposition: A | Discharge status: Home / Self Care | Payer: Medicare HMO | Source: Ambulatory Visit | Attending: Family Medicine | Admitting: Family Medicine

## 2019-08-19 DIAGNOSIS — Z1231 Encounter for screening mammogram for malignant neoplasm of breast: Secondary | ICD-10-CM | POA: Diagnosis not present

## 2019-08-19 DIAGNOSIS — Z79899 Other long term (current) drug therapy: Secondary | ICD-10-CM | POA: Diagnosis not present

## 2019-08-19 NOTE — Telephone Encounter (Signed)
Patient is at [facility] now for labs. Please release orders.

## 2019-08-19 NOTE — Telephone Encounter (Signed)
Lab Orders released.  

## 2019-08-20 ENCOUNTER — Other Ambulatory Visit: Payer: Self-pay

## 2019-08-20 LAB — CBC WITH DIFFERENTIAL/PLATELET
Basophils Absolute: 0.1 10*3/uL (ref 0.0–0.2)
Basos: 1 %
EOS (ABSOLUTE): 0.1 10*3/uL (ref 0.0–0.4)
Eos: 1 %
Hematocrit: 37.8 % (ref 34.0–46.6)
Hemoglobin: 13.1 g/dL (ref 11.1–15.9)
Immature Grans (Abs): 0 10*3/uL (ref 0.0–0.1)
Immature Granulocytes: 0 %
Lymphocytes Absolute: 3.3 10*3/uL — ABNORMAL HIGH (ref 0.7–3.1)
Lymphs: 47 %
MCH: 32.7 pg (ref 26.6–33.0)
MCHC: 34.7 g/dL (ref 31.5–35.7)
MCV: 94 fL (ref 79–97)
Monocytes Absolute: 0.7 10*3/uL (ref 0.1–0.9)
Monocytes: 10 %
Neutrophils Absolute: 2.8 10*3/uL (ref 1.4–7.0)
Neutrophils: 41 %
Platelets: 273 10*3/uL (ref 150–450)
RBC: 4.01 x10E6/uL (ref 3.77–5.28)
RDW: 13.1 % (ref 11.7–15.4)
WBC: 6.9 10*3/uL (ref 3.4–10.8)

## 2019-08-20 LAB — CMP14+EGFR
ALT: 34 IU/L — ABNORMAL HIGH (ref 0–32)
AST: 43 IU/L — ABNORMAL HIGH (ref 0–40)
Albumin/Globulin Ratio: 1.4 (ref 1.2–2.2)
Albumin: 4.3 g/dL (ref 3.8–4.9)
Alkaline Phosphatase: 51 IU/L (ref 39–117)
BUN/Creatinine Ratio: 12 (ref 9–23)
BUN: 10 mg/dL (ref 6–24)
Bilirubin Total: 0.2 mg/dL (ref 0.0–1.2)
CO2: 27 mmol/L (ref 20–29)
Calcium: 9.6 mg/dL (ref 8.7–10.2)
Chloride: 101 mmol/L (ref 96–106)
Creatinine, Ser: 0.82 mg/dL (ref 0.57–1.00)
GFR calc Af Amer: 94 mL/min/{1.73_m2} (ref >59)
GFR calc non Af Amer: 82 mL/min/{1.73_m2} (ref >59)
Globulin, Total: 3.1 g/dL (ref 1.5–4.5)
Glucose: 102 mg/dL — ABNORMAL HIGH (ref 65–99)
Potassium: 4.6 mmol/L (ref 3.5–5.2)
Sodium: 141 mmol/L (ref 134–144)
Total Protein: 7.4 g/dL (ref 6.0–8.5)

## 2019-08-20 MED ORDER — METHOTREXATE SODIUM CHEMO INJECTION 50 MG/2ML: INTRAMUSCULAR | 0 refills | Status: DC

## 2019-08-20 NOTE — Progress Notes (Signed)
LFTs are borderline elevated.  Please advise patient to reduce MTX to 0.8 ml sq once weekly.  Avoid NSAIDs, tylenol, and alcohol.  Absolute lymphocytes are borderline elevated. WBC count WNL.  We will continue to monitor lab work closely.

## 2019-08-25 DIAGNOSIS — R232 Flushing: Secondary | ICD-10-CM | POA: Diagnosis not present

## 2019-08-25 DIAGNOSIS — L989 Disorder of the skin and subcutaneous tissue, unspecified: Secondary | ICD-10-CM | POA: Diagnosis not present

## 2019-09-09 ENCOUNTER — Other Ambulatory Visit: Payer: Self-pay | Admitting: Rheumatology

## 2019-09-10 NOTE — Telephone Encounter (Signed)
Last Visit:07/02/2019 telemedicine Next Visit: 11/03/19 Labs: 08/19/19 LFTs are borderline elevated. Absolute lymphocytes are borderline elevated. WBC count WNL.   Okay to refill per Dr. Estanislado Pandy

## 2019-09-15 ENCOUNTER — Other Ambulatory Visit: Payer: Self-pay | Admitting: Physician Assistant

## 2019-09-15 NOTE — Telephone Encounter (Signed)
Last Visit:07/02/2019 telemedicine Next Visit: 11/03/19 Labs: 08/19/19 LFTs are borderline elevated. Absolute lymphocytes are borderline elevated. WBC count WNL.reduce MTX to 0.8 ml sq once weekly   Okay to refill per Dr. Estanislado Pandy

## 2019-09-17 ENCOUNTER — Other Ambulatory Visit: Payer: Self-pay | Admitting: *Deleted

## 2019-09-17 MED ORDER — SPIRIVA RESPIMAT 2.5 MCG/ACT IN AERS: 2.0000 | INHALATION_SPRAY | Freq: Every day | RESPIRATORY_TRACT | 4 refills | Status: DC

## 2019-09-24 ENCOUNTER — Other Ambulatory Visit: Payer: Self-pay | Admitting: Emergency Medicine

## 2019-09-24 MED ORDER — SPIRIVA RESPIMAT 2.5 MCG/ACT IN AERS: 2.0000 | INHALATION_SPRAY | Freq: Every day | RESPIRATORY_TRACT | 4 refills | Status: DC

## 2019-10-08 DIAGNOSIS — I10 Essential (primary) hypertension: Secondary | ICD-10-CM | POA: Diagnosis not present

## 2019-10-08 DIAGNOSIS — J449 Chronic obstructive pulmonary disease, unspecified: Secondary | ICD-10-CM | POA: Diagnosis not present

## 2019-10-08 DIAGNOSIS — E7800 Pure hypercholesterolemia, unspecified: Secondary | ICD-10-CM | POA: Diagnosis not present

## 2019-10-08 DIAGNOSIS — E669 Obesity, unspecified: Secondary | ICD-10-CM | POA: Diagnosis not present

## 2019-10-08 DIAGNOSIS — E039 Hypothyroidism, unspecified: Secondary | ICD-10-CM | POA: Diagnosis not present

## 2019-10-15 ENCOUNTER — Other Ambulatory Visit: Payer: Self-pay

## 2019-10-15 ENCOUNTER — Encounter (INDEPENDENT_AMBULATORY_CARE_PROVIDER_SITE_OTHER): Payer: Self-pay | Admitting: Family Medicine

## 2019-10-15 ENCOUNTER — Ambulatory Visit (INDEPENDENT_AMBULATORY_CARE_PROVIDER_SITE_OTHER): Payer: Medicare HMO | Admitting: Family Medicine

## 2019-10-15 VITALS — BP 105/72 | HR 78 | Temp 98.0°F | Ht 66.0 in | Wt 224.0 lb

## 2019-10-15 DIAGNOSIS — R739 Hyperglycemia, unspecified: Secondary | ICD-10-CM

## 2019-10-15 DIAGNOSIS — R0602 Shortness of breath: Secondary | ICD-10-CM | POA: Diagnosis not present

## 2019-10-15 DIAGNOSIS — I10 Essential (primary) hypertension: Secondary | ICD-10-CM

## 2019-10-15 DIAGNOSIS — Z0289 Encounter for other administrative examinations: Secondary | ICD-10-CM

## 2019-10-15 DIAGNOSIS — F3289 Other specified depressive episodes: Secondary | ICD-10-CM

## 2019-10-15 DIAGNOSIS — E038 Other specified hypothyroidism: Secondary | ICD-10-CM | POA: Diagnosis not present

## 2019-10-15 DIAGNOSIS — E7849 Other hyperlipidemia: Secondary | ICD-10-CM | POA: Diagnosis not present

## 2019-10-15 DIAGNOSIS — Z6836 Body mass index (BMI) 36.0-36.9, adult: Secondary | ICD-10-CM

## 2019-10-15 DIAGNOSIS — R5383 Other fatigue: Secondary | ICD-10-CM

## 2019-10-15 DIAGNOSIS — Z9189 Other specified personal risk factors, not elsewhere classified: Secondary | ICD-10-CM

## 2019-10-15 MED ORDER — VORTIOXETINE HBR 10 MG PO TABS: 10.0000 mg | ORAL_TABLET | Freq: Every day | ORAL | 0 refills | Status: DC

## 2019-10-15 NOTE — Telephone Encounter (Signed)
Please advise 

## 2019-10-15 NOTE — Progress Notes (Signed)
Dear Dr. Mayra Chavez,   Thank you for referring [name] Chavez to our clinic. The following note includes my evaluation and treatment recommendations.  Chief Complaint:   [name] Chavez (MR# [id number]) is a 54 y.o. female who presents for evaluation and treatment of [name] and related comorbidities. Current BMI is Body mass index is 36.15 kg/m.Marland Kitchen [name] Chavez has been struggling with her weight for many years and has been unsuccessful in either losing weight, maintaining weight loss, or reaching her healthy weight goal.  [name] Chavez is currently in the action stage of change and ready to dedicate time achieving and maintaining a healthier weight. [name] Chavez is interested in becoming our patient and working on intensive lifestyle modifications including (but not limited to) diet and exercise for weight loss.  Upon waking up, [name] Chavez has juice and water with no breakfast; at 1-2 p.m. she will have 4 cups of cereal with 2% milk; for dinner she has spaghetti and salad or 1 chicken breast with 2 cups brussel sprouts and 1 baked potato with butter, sour cream, and salt and pepper; for snack she will have Pringles or Oreo cookies.  [name] Chavez's habits were reviewed today and are as follows: Her family eats dinner together, she thinks her family will eat healthier with her, she struggles with family and or coworkers weight loss sabotage, her desired weight loss is 79 lbs, she started gaining weight in 1998, her heaviest weight ever was 224 pounds, she craves sweets, she snacks frequently in the evenings, she skips breakfast and lunch, she is frequently drinking liquids with calories, she frequently makes poor food choices, she frequently eats larger portions than normal, she has binge eating behaviors and she struggles with emotional eating.  Depression Screen [name] Chavez's Food and Mood (modified PHQ-9) score was 13.  Depression screen PHQ 2/9 10/15/2019  Decreased Interest 1  Down, Depressed, Hopeless 1    PHQ - 2 Score 2  Altered sleeping 0  Tired, decreased energy 3  Change in appetite 2  Feeling bad or failure about yourself  2  Trouble concentrating 2  Moving slowly or fidgety/restless 2  Suicidal thoughts 0  PHQ-9 Score 13  Difficult doing work/chores Somewhat difficult  Some recent data might be hidden   Subjective:   Other fatigue. [name] Chavez denies daytime somnolence and admits to waking up still tired. Patent has a history of symptoms of Epworth sleepiness scale. [name] Chavez generally gets 5-7 hours of sleep per night, and states that she has generally restful sleep. Snoring is present. Apneic episodes are not present. Epworth Sleepiness Score is 10.  SOB (shortness of breath) on exertion. [name] notes increasing shortness of breath with exercising and seems to be worsening over time with weight gain. She notes getting out of breath sooner with activity than she used to. This has gotten worse recently. [name] Chavez denies shortness of breath at rest or orthopnea.  Essential hypertension. Blood pressure is well controlled today. No chest pain, no chest pressure, or headache. [name] is on metoprolol 50 mg daily.  BP Readings from Last 3 Encounters:  10/15/19 105/72  02/03/19 126/69  09/23/18 126/88   Lab Results  Component Value Date   CREATININE 0.82 08/19/2019   CREATININE 0.88 05/08/2019   CREATININE 0.75 02/03/2019   Other hyperlipidemia. LDL 69, HDL 58, triglycerides 130, total 150 (labs done 10/08/2019). [name] Chavez is on Crestor 20 mg. No myalgias. She was diagnosed ~30 years ago.   Lab Results  Component Value Date   CHOL 153 05/07/2018  HDL 61 05/07/2018   LDLCALC 75 05/07/2018   TRIG 90 05/07/2018   CHOLHDL 2.5 05/07/2018   Lab Results  Component Value Date   ALT 34 (H) 08/19/2019   AST 43 (H) 08/19/2019   ALKPHOS 51 08/19/2019   BILITOT 0.2 08/19/2019   The 10-year ASCVD risk score [name] Chavez DC Jr., et al., 2013) is: 1.1%   Values used to calculate the score:     Age: 11  years     Sex: Female     Is Non-Hispanic African American: No     Diabetic: No     Tobacco smoker: No     Systolic Blood Pressure: 315 mmHg     Is BP treated: Yes     HDL Cholesterol: 61 mg/dL     Total Cholesterol: 153 mg/dL  Other specified hypothyroidism. No cold intolerance, heat intolerance, or palpitations. [name] is on levothyroxine 125 and 137 mcg alternating days. TSH 1.47.  Lab Results  Component Value Date   TSH 1.811  01/10/2007   Other depression, with emotional eating. [name] is struggling with emotional eating and using food for comfort to the extent that it is negatively impacting her health. She has been working on behavior modification techniques to help reduce her emotional eating and has been somewhat successful. She shows no sign of suicidal or homicidal ideations. [name] voices she is experiencing sexual dysfunction.  Hyperglycemia. [name] Chavez has a history of some elevated blood glucose readings without a diagnosis of diabetes. Glucose 102. No history of prediabetes. She is on no medications.  At risk for heart disease. [name] Chavez is at a higher than average risk for cardiovascular disease due to [name].   Assessment/Plan:   Other fatigue. [name] Chavez does feel that her weight is causing her energy to be lower than it should be. Fatigue may be related to [name], depression or many other causes. Labs will be ordered, and in the meanwhile, [name] Chavez will focus on self care including making healthy food choices, increasing physical activity and focusing on stress reduction. EKG 12-Lead showed NSR at 76 BPM. VITAMIN D 25 Hydroxy (Vit-D Deficiency, Fractures) labs ordered today.  SOB (shortness of breath) on exertion. [name] Chavez does feel that she gets out of breath more easily that she used to when she exercises. [name] Chavez's shortness of breath appears to be [name] related and exercise induced. She has agreed to work on weight loss and gradually increase exercise to treat her exercise  induced shortness of breath. Will continue to monitor closely.  Essential hypertension. [name] Chavez is working on healthy weight loss and exercise to improve blood pressure control. We will watch for signs of hypotension as she continues her lifestyle modifications. Comprehensive metabolic panel ordered today. Will follow-up at her next appointment.  Other hyperlipidemia. Cardiovascular risk and specific lipid/LDL goals reviewed.  We discussed several lifestyle modifications today and [name] Chavez will continue to work on diet, exercise and weight loss efforts. Orders and follow up as documented in patient record. Will repeat labs in 3 months.  Counseling Intensive lifestyle modifications are the first line treatment for this issue. . Dietary changes: Increase soluble fiber. Decrease simple carbohydrates. . Exercise changes: Moderate to vigorous-intensity aerobic activity 150 minutes per week if tolerated. . Lipid-lowering medications: see documented in medical record.  Other specified hypothyroidism. Patient with long-standing hypothyroidism, on levothyroxine therapy. She appears euthyroid. Orders and follow up as documented in patient record. T3, T4, free, TSH labs ordered today.  Counseling . Good thyroid control is important for overall health.  Supratherapeutic thyroid levels are dangerous and will not improve weight loss results. . The correct way to take levothyroxine is fasting, with water, separated by at least 30 minutes from breakfast, and separated by more than 4 hours from calcium, iron, multivitamins, acid reflux medications (PPIs).     Other depression, with emotional eating. Behavior modification techniques were discussed today to help [name] Chavez deal with her emotional/non-hunger eating behaviors.  Orders and follow up as documented in patient record. [name] Chavez will decrease Zoloft to 50 mg PO daily x1 week and then start vortioxetine HBr (TRINTELLIX) 10 MG TABS tablet PO daily #30 with 0  refills.  Hyperglycemia. Fasting labs will be obtained and results with be discussed with [name] Chavez in 2 weeks at her follow up visit. In the meanwhile [name] Chavez was started on a lower simple carbohydrate diet and will work on weight loss efforts. Hemoglobin A1c, Insulin, random ordered today.  At risk for heart disease. [name] Chavez was given approximately 15 minutes of coronary artery disease prevention counseling today. She is 54 y.o. female and has risk factors for heart disease including [name]. We discussed intensive lifestyle modifications today with an emphasis on specific weight loss instructions and strategies.   Repetitive spaced learning was employed today to elicit superior memory formation and behavioral change.  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  [name] Chavez is currently in the action stage of change and her goal is to continue with weight loss efforts. I recommend [name] Chavez begin the structured treatment plan as follows:  She has agreed to the Category 3 Plan.  Exercise goals: No exercise has been prescribed at this time.   Behavioral modification strategies: increasing lean protein intake, increasing vegetables, meal planning and cooking strategies, keeping healthy foods in the home and planning for success.  She was informed of the importance of frequent follow-up visits to maximize her success with intensive lifestyle modifications for her multiple health conditions. She was informed we would discuss her lab results at her next visit unless there is a critical issue that needs to be addressed sooner. [name] Chavez agreed to keep her next visit at the agreed upon time to discuss these results.  Objective:   Blood pressure 105/72, pulse 78, temperature 98 F (36.7 C), temperature source Oral, height 5' 6"  (1.676 m), weight 224 lb (101.6 kg), SpO2 99 %. Body mass index is 36.15 kg/m.  EKG: Sinus  Rhythm  with a rate of 76 BPM. Low voltage in precordial leads. Abnormal.   Indirect Calorimeter completed today shows a VO2 of 316 and a REE of 2200.  Her calculated basal metabolic rate is 3009 thus her basal metabolic rate is better than expected.  General: Cooperative, alert, well developed, in no acute distress. HEENT: Conjunctivae and lids unremarkable. Cardiovascular: Regular rhythm.  Lungs: Normal work of breathing. Neurologic: No focal deficits.   Lab Results  Component Value Date   CREATININE 0.82 08/19/2019   BUN 10 08/19/2019   NA 141 08/19/2019   K 4.6 08/19/2019   CL 101 08/19/2019   CO2 27 08/19/2019   Lab Results  Component Value Date   ALT 34 (H) 08/19/2019   AST 43 (H) 08/19/2019   ALKPHOS 51 08/19/2019   BILITOT 0.2 08/19/2019   No results found for: HGBA1C No results found for: INSULIN Lab Results  Component Value Date   TSH 1.811  01/10/2007   Lab Results  Component Value Date  CHOL 153 05/07/2018   HDL 61 05/07/2018   LDLCALC 75 05/07/2018   TRIG 90 05/07/2018   CHOLHDL 2.5 05/07/2018   Lab Results  Component Value Date   WBC 6.9 08/19/2019   HGB 13.1 08/19/2019   HCT 37.8 08/19/2019   MCV 94 08/19/2019   PLT 273 08/19/2019   Lab Results  Component Value Date   IRON 85 05/31/2015   FERRITIN 55.2 03/28/2018   Attestation Statements:   This is the patient's first visit at [facility]. The patient's NEW PATIENT PACKET was reviewed at length. Included in the packet: current and past health history, medications, allergies, ROS, gynecologic history (women only), surgical history, family history, social history, weight history, weight loss surgery history (for those that have had weight loss surgery), nutritional evaluation, mood and food questionnaire, PHQ9, Epworth questionnaire, sleep habits questionnaire, patient life and health improvement goals questionnaire. These will all be scanned into the patient's chart under media.   During  the visit, I independently reviewed the patient's EKG, bioimpedance scale results, and indirect calorimeter results. I used this information to tailor a meal plan for the patient that will help her to lose weight and will improve her [name]-related conditions going forward. I performed a medically necessary appropriate examination and/or evaluation. I discussed the assessment and treatment plan with the patient. The patient was provided an opportunity to ask questions and all were answered. The patient agreed with the plan and demonstrated an understanding of the instructions. Labs were ordered at this visit and will be reviewed at the next visit unless more critical results need to be addressed immediately. Clinical information was updated and documented in the EMR.   Time spent on visit including pre-visit chart review and post-visit care was 45 minutes.   A separate 15 minutes was spent on risk counseling (see above).    I, Michaelene Song, am acting as transcriptionist for Coralie Common, MD   I have reviewed the above documentation for accuracy and completeness, and I agree with the above. - Ilene Qua, MD

## 2019-10-16 ENCOUNTER — Encounter (INDEPENDENT_AMBULATORY_CARE_PROVIDER_SITE_OTHER): Payer: Self-pay | Admitting: Family Medicine

## 2019-10-16 NOTE — Telephone Encounter (Signed)
Please Advise

## 2019-10-23 LAB — COMPREHENSIVE METABOLIC PANEL

## 2019-10-23 LAB — TSH

## 2019-10-23 LAB — VITAMIN D 25 HYDROXY (VIT D DEFICIENCY, FRACTURES)

## 2019-10-23 LAB — T3

## 2019-10-23 LAB — HEMOGLOBIN A1C
Est. average glucose Bld gHb Est-mCnc: 120 mg/dL
Hgb A1c MFr Bld: 5.8 % — ABNORMAL HIGH (ref 4.8–5.6)

## 2019-10-23 LAB — T4, FREE

## 2019-10-23 LAB — INSULIN, RANDOM

## 2019-10-24 NOTE — Progress Notes (Signed)
Office Visit Note  Patient: [name]             Date of Birth: Nov 04, 1965           MRN: [id number]             PCP: Mayra Neer, MD Referring: Mayra Neer, MD Visit Date: 11/03/2019 Occupation: @GUAROCC @  Subjective:  Joint stiffness.   History of Present Illness: [name] is a 54 y.o. female with history of psoriatic arthritis and osteoarthritis.  She states she has been continued to have some joint stiffness but no joint swelling.  She has been taking Xeljanz and methotrexate on a regular basis.  She also had her second Covid vaccine recently.  She has some lower back pain from underlying disc disease.  She also has some knee joint discomfort off and on.  She states she has been going to a weight loss clinic now.  She also had her recent lipid panel which was good per patient.  Activities of Daily Living:  Patient reports morning stiffness for 15-30 minutes.   Patient Denies nocturnal pain.  Difficulty dressing/grooming: Denies Difficulty climbing stairs: Reports Difficulty getting out of chair: Denies Difficulty using hands for taps, buttons, cutlery, and/or writing: Reports  Review of Systems  Constitutional: Positive for fatigue. Negative for night sweats, weight gain and weight loss.  HENT: Negative for mouth sores, trouble swallowing, trouble swallowing, mouth dryness and nose dryness.   Eyes: Negative for pain, redness, itching, visual disturbance and dryness.  Respiratory: Negative for cough, shortness of breath and difficulty breathing.   Cardiovascular: Negative for chest pain, palpitations, hypertension, irregular heartbeat and swelling in legs/feet.  Gastrointestinal: Negative for blood in stool, constipation and diarrhea.  Endocrine: Negative for increased urination.  Genitourinary: Negative for difficulty urinating and vaginal dryness.  Musculoskeletal: Positive for arthralgias, joint pain, morning stiffness and muscle tenderness. Negative for  joint swelling, myalgias, muscle weakness and myalgias.  Skin: Positive for hair loss. Negative for color change, rash, redness, skin tightness, ulcers and sensitivity to sunlight.  Allergic/Immunologic: Negative for susceptible to infections.  Neurological: Negative for dizziness, numbness, headaches, memory loss, night sweats and weakness.  Hematological: Negative for bruising/bleeding tendency and swollen glands.  Psychiatric/Behavioral: Negative for depressed mood, confusion and sleep disturbance. The patient is not nervous/anxious.     PMFS History:  Patient Active Problem List   Diagnosis Date Noted  . Tobacco use 08/06/2019  . Allergic rhinitis 04/19/2018  . Obstructive sleep apnea syndrome, mild 04/19/2018  . COPD (chronic obstructive pulmonary disease) ([place]) 03/26/2018  . Rectal bleeding 01/22/2018  . LLQ abdominal pain 01/22/2018  . Dysphagia 01/12/2017  . 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 Primary osteoarthritis of both knees 08/11/2016  . History of hypertension 08/11/2016  . History of ulcerative colitis 08/11/2016  . History of IBS 08/11/2016  . 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 High risk medications (not anticoagulants) long-term use 05/13/2016  . Knee pain, bilateral 05/13/2016  . Anserine bursitis 05/13/2016  . 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 Chondromalacia patellae, right knee 05/13/2016  . Psoriatic arthritis ([place]) 05/12/2016  . UNSPECIFIED HYPOTHYROIDISM 08/24/2009  . HYPERLIPIDEMIA 08/24/2009  . Essential hypertension 08/24/2009  . GERD 08/24/2009  . Ulcerative colitis ([place]) 08/24/2009  . IRRITABLE BOWEL SYNDROME 08/24/2009  . OVARIAN CYST 08/24/2009  . PSORIASIS 08/24/2009  . ARTHRITIS 08/24/2009  . TACHYCARDIA, HX OF 08/24/2009  . PANCREATITIS, HX OF 08/24/2009  . FIBROCYSTIC BREAST DISEASE,  HX OF 08/24/2009    Past Medical History:  Diagnosis Date  . Allergy   . Anxiety   . Arthritis   .  Asthma   . Back pain   . Cataract    both eyes  surgically repaired  . COPD (chronic obstructive pulmonary disease) ([place])   . Depression    treated  . Dyspnea   . Fibrocystic breast disease   . GERD (gastroesophageal reflux disease)   . History of hiatal hernia    repaired  . History of kidney stones    questionable  . Hyperlipidemia   . Hypertension    pt denies  . Hypothyroidism   . Irritable bowel syndrome   . Joint pain   . Osteoarthritis   . Ovarian cyst   . Palpitations   . Pancreatitis   . PONV (postoperative nausea and vomiting)    NO TROUBLE WITH CATARACT SURGERY  . Psoriasis   . Psoriatic arthritis ([place]) 05/12/2016   Poor response to Humira Inadequate response to Enbrel Inadequate response to Simponi  . Shortness of breath   . Tachycardia   . Ulcerative colitis     Family History  Problem Relation Age of Onset  . Breast cancer Mother 9  . Heart disease Mother   . Colon polyps Mother   . Other Mother        pre-cancerous tumor removed  . Colon cancer Mother   . Hypertension Mother   . Hyperlipidemia Mother   . Cancer Mother   . Depression Mother   . Heart disease Father   . Hypertension Father   . Hyperlipidemia Father   . Stroke Father   . Alcohol abuse Father   . Crohn's disease Sister   . Alcoholism Sister   . Irritable bowel syndrome Other   . Crohn's disease Daughter   . Breast cancer Paternal Aunt   . Breast cancer Maternal Grandmother   . Esophageal cancer Neg Hx   . Rectal cancer Neg Hx   . Stomach cancer Neg Hx   . Pancreatic cancer Neg Hx    Past Surgical History:  Procedure Laterality Date  . ABDOMINAL HYSTERECTOMY    . ANKLE RECONSTRUCTION     left  . bladder tuck    . CATARACT EXTRACTION W/PHACO Right 09/11/2017   Procedure: CATARACT EXTRACTION PHACO AND INTRAOCULAR LENS PLACEMENT (IOC);  Surgeon: Birder Robson, MD;  Location: [facility];  Service: Ophthalmology;  Laterality: Right;  [place] 00:18.5 AP% 6.9 CDE 1.29 Fluid Pack  Lot # [id number]  . CATARACT EXTRACTION W/PHACO Left 10/02/2017   Procedure: CATARACT EXTRACTION PHACO AND INTRAOCULAR LENS PLACEMENT ([place]);  Surgeon: Birder Robson, MD;  Location: [facility];  Service: Ophthalmology;  Laterality: Left;  [place] 00:25.9 AP% 6.9 CDE 1.80 Fluid Pack Lot # [id number]  . CLOSED REDUCTION NASAL FRACTURE N/A 05/21/2018   Procedure: CLOSED REDUCTION NASAL FRACTURE;  Surgeon: Clyde Canterbury, MD;  Location: [place];  Service: ENT;  Laterality: N/A;  Latex sensitivity  . COLONOSCOPY    . EYE SURGERY     Lasik  . FRACTURE SURGERY    . KNEE SURGERY     left  . LAPAROSCOPIC NISSEN FUNDOPLICATION N/A 3/33/8329   Procedure: LAPAROSCOPIC NISSEN TAKEDOWN AND UPPER ENDOSCOPY;  Surgeon: Johnathan Hausen, MD;  Location: [facility];  Service: General;  Laterality: N/A;  . NASAL SEPTUM SURGERY    . NISSEN FUNDOPLICATION    . PARTIAL HYSTERECTOMY    . POLYPECTOMY    . repair  cystocele and rectocele    . thyroid ablation    . TUBAL LIGATION    . UPPER GASTROINTESTINAL ENDOSCOPY    . UPPER GI ENDOSCOPY  01/12/2017   Procedure: UPPER GI ENDOSCOPY;  Surgeon: Johnathan Hausen, MD;  Location: [facility];  Service: General;;   Social History   Social History Narrative  . Not on file   Immunization History  Administered Date(s) Administered  . Influenza Inj Mdck Quad Pf 05/08/2017  . Influenza,inj,Quad PF,6+ Mos 03/08/2018, 03/29/2019, 06/25/2019     Objective: Vital Signs: BP 98/65 (BP Location: Left Arm, Patient Position: Sitting, Cuff Size: Normal)   Pulse 65   Resp 16   Ht 5' 6"  (1.676 m)   Wt 222 lb (100.7 kg)   BMI 35.83 kg/m    Physical Exam Vitals and nursing note reviewed.  Constitutional:      Appearance: She is well-developed.  HENT:     Head: Normocephalic and atraumatic.  Eyes:     Conjunctiva/sclera: Conjunctivae normal.  Cardiovascular:     Rate and Rhythm: Normal rate and regular rhythm.     Heart sounds: Normal heart sounds.  Pulmonary:      Effort: Pulmonary effort is normal.     Breath sounds: Normal breath sounds.  Abdominal:     General: Bowel sounds are normal.     Palpations: Abdomen is soft.  Musculoskeletal:     Cervical back: Normal range of motion.  Lymphadenopathy:     Cervical: No cervical adenopathy.  Skin:    General: Skin is warm and dry.     Capillary Refill: Capillary refill takes less than 2 seconds.  Neurological:     Mental Status: She is alert and oriented to person, place, and time.  Psychiatric:        Behavior: Behavior normal.      Musculoskeletal Exam: C-spine was not range of motion.  Shoulder joints, elbow joints, wrist joints with good range of motion.  She has some PIP and DIP thickening but no synovitis was noted.  Hip joints, knee joints, ankles, MTPs and PIPs with good range of motion.  CDAI Exam: CDAI Score: 0.6  Patient Global: 3 mm; Provider Global: 3 mm Swollen: 0 ; Tender: 0  Joint Exam 11/03/2019   No joint exam has been documented for this visit   There is currently no information documented on the homunculus. Go to the Rheumatology activity and complete the homunculus joint exam.  Investigation: No additional findings.  Imaging: No results found.  Recent Labs: Lab Results  Component Value Date   WBC 6.9 08/19/2019   HGB 13.1 08/19/2019   PLT 273 08/19/2019   NA 143 10/27/2019   K 4.4 10/27/2019   CL 102 10/27/2019   CO2 20 10/27/2019   GLUCOSE 95 10/27/2019   BUN 21 10/27/2019   CREATININE 0.90 10/27/2019   BILITOT 0.3 10/27/2019   ALKPHOS 61 10/27/2019   AST 22 10/27/2019   ALT 19 10/27/2019   PROT 7.2 10/27/2019   ALBUMIN 4.2 10/27/2019   CALCIUM 9.1 10/27/2019   GFRAA 84 10/27/2019   QFTBGOLDPLUS Negative 10/15/2018    Speciality Comments: No specialty comments available.  Procedures:  No procedures performed Allergies: Demerol [meperidine], Imuran [azathioprine], Meperidine hcl, Remicade [infliximab], Sulfasalazine, Avelox [moxifloxacin],  Doxycycline, Erythromycin, and Latex   Assessment / Plan:     Visit Diagnoses: Psoriatic arthritis (HCC)-patient had no synovitis on my examination today.  She complains of intermittent pain.  Psoriasis-she gets intermittent rash from  psoriasis.  High risk medication use - Xeljanz 5 mg BID, MTX 0.8 ml sq once weekly, folic acid 2 mg po daily -she had recent CMP which was normal.  Will obtain following labs today.  Patient has been getting lipid panel through her PCP.  Plan: CBC with Differential/Platelet, QuantiFERON-TB Gold Plus  Primary osteoarthritis of both knees-she has some chronic discomfort.  DDD (degenerative disc disease), lumbar-chronic pain.  Achilles tendonitis, bilateral-patient had no recent flares.  History of ulcerative colitis-currently not active per patient.  History of hypertension-her blood pressure is normal.  History of IBS  History of gastroesophageal reflux (GERD)  Vitamin D deficiency  BMI 35.83-she has been going to weight loss clinic.  Orders: Orders Placed This Encounter  Procedures  . CBC with Differential/Platelet  . QuantiFERON-TB Gold Plus   No orders of the defined types were placed in this encounter.    Follow-Up Instructions: Return in about 5 months (around 04/04/2020) for Psoriatic arthritis.   Bo Merino, MD  Note - This record has been created using Editor, commissioning.  Chart creation errors have been sought, but may not always  have been located. Such creation errors do not reflect on  the standard of medical care.

## 2019-10-27 ENCOUNTER — Other Ambulatory Visit (INDEPENDENT_AMBULATORY_CARE_PROVIDER_SITE_OTHER): Payer: Self-pay

## 2019-10-27 DIAGNOSIS — E559 Vitamin D deficiency, unspecified: Secondary | ICD-10-CM | POA: Diagnosis not present

## 2019-10-27 DIAGNOSIS — E038 Other specified hypothyroidism: Secondary | ICD-10-CM

## 2019-10-27 DIAGNOSIS — R5383 Other fatigue: Secondary | ICD-10-CM

## 2019-10-27 DIAGNOSIS — R0602 Shortness of breath: Secondary | ICD-10-CM | POA: Diagnosis not present

## 2019-10-27 DIAGNOSIS — R739 Hyperglycemia, unspecified: Secondary | ICD-10-CM | POA: Diagnosis not present

## 2019-10-27 DIAGNOSIS — E7849 Other hyperlipidemia: Secondary | ICD-10-CM

## 2019-10-28 LAB — COMPREHENSIVE METABOLIC PANEL
ALT: 19 IU/L (ref 0–32)
AST: 22 IU/L (ref 0–40)
Albumin/Globulin Ratio: 1.4 (ref 1.2–2.2)
Albumin: 4.2 g/dL (ref 3.8–4.9)
Alkaline Phosphatase: 61 IU/L (ref 39–117)
BUN/Creatinine Ratio: 23 (ref 9–23)
BUN: 21 mg/dL (ref 6–24)
Bilirubin Total: 0.3 mg/dL (ref 0.0–1.2)
CO2: 20 mmol/L (ref 20–29)
Calcium: 9.1 mg/dL (ref 8.7–10.2)
Chloride: 102 mmol/L (ref 96–106)
Creatinine, Ser: 0.9 mg/dL (ref 0.57–1.00)
GFR calc Af Amer: 84 mL/min/{1.73_m2} (ref >59)
GFR calc non Af Amer: 73 mL/min/{1.73_m2} (ref >59)
Globulin, Total: 3 g/dL (ref 1.5–4.5)
Glucose: 95 mg/dL (ref 65–99)
Potassium: 4.4 mmol/L (ref 3.5–5.2)
Sodium: 143 mmol/L (ref 134–144)
Total Protein: 7.2 g/dL (ref 6.0–8.5)

## 2019-10-28 LAB — T4, FREE: Free T4: 1.22 ng/dL (ref 0.82–1.77)

## 2019-10-28 LAB — VITAMIN D 25 HYDROXY (VIT D DEFICIENCY, FRACTURES): Vit D, 25-Hydroxy: 22.5 ng/mL — ABNORMAL LOW (ref 30.0–100.0)

## 2019-10-28 LAB — INSULIN, RANDOM: INSULIN: 20.8 u[IU]/mL (ref 2.6–24.9)

## 2019-10-28 LAB — T3: T3, Total: 109 ng/dL (ref 71–180)

## 2019-10-28 LAB — TSH: TSH: 1.94 u[IU]/mL (ref 0.450–4.500)

## 2019-10-29 ENCOUNTER — Ambulatory Visit (INDEPENDENT_AMBULATORY_CARE_PROVIDER_SITE_OTHER): Payer: Medicare HMO | Admitting: Family Medicine

## 2019-10-29 ENCOUNTER — Encounter (INDEPENDENT_AMBULATORY_CARE_PROVIDER_SITE_OTHER): Payer: Self-pay | Admitting: Family Medicine

## 2019-10-29 ENCOUNTER — Other Ambulatory Visit: Payer: Self-pay

## 2019-10-29 VITALS — BP 104/55 | HR 73 | Temp 97.9°F | Ht 66.0 in | Wt 218.0 lb

## 2019-10-29 DIAGNOSIS — E559 Vitamin D deficiency, unspecified: Secondary | ICD-10-CM | POA: Diagnosis not present

## 2019-10-29 DIAGNOSIS — R7303 Prediabetes: Secondary | ICD-10-CM | POA: Diagnosis not present

## 2019-10-29 DIAGNOSIS — Z6835 Body mass index (BMI) 35.0-35.9, adult: Secondary | ICD-10-CM

## 2019-10-29 DIAGNOSIS — F3289 Other specified depressive episodes: Secondary | ICD-10-CM | POA: Diagnosis not present

## 2019-10-29 DIAGNOSIS — Z9189 Other specified personal risk factors, not elsewhere classified: Secondary | ICD-10-CM

## 2019-10-29 DIAGNOSIS — E7849 Other hyperlipidemia: Secondary | ICD-10-CM | POA: Diagnosis not present

## 2019-10-29 MED ORDER — VITAMIN D (ERGOCALCIFEROL) 1.25 MG (50000 UNIT) PO CAPS: 50000.0000 [IU] | ORAL_CAPSULE | ORAL | 0 refills | Status: DC

## 2019-10-30 NOTE — Progress Notes (Signed)
Chief Complaint:   OBESITY [name] is here to discuss her progress with her obesity treatment plan along with follow-up of her obesity related diagnoses. [name] is on the Category 3 Plan and states she is following her eating plan approximately 100% of the time. [name] states she is walking for 30 minutes 4 times per week.  Today's visit was #: 2 Starting weight: 224 lbs Starting date: 10/15/2019 Today's weight: 218 lbs Today's date: 10/29/2019 Total lbs lost to date: 6 lbs Total lbs lost since last in-office visit: 6 lbs  Interim History: [name] voices that she felt the plan was monotonous after the first few days.  She is looking for some vegetarian options.  She says the amount of meat is a bit much.  For snacks, she is doing hummus and carrots, pita crackers, cheese sticks, ice cream bars, and mini Kind bars.  She denies hunger.  She is getting in 8-10 ounces of protein at dinner.  She has a craving for cereal.  Subjective:   1. Prediabetes [name] has a diagnosis of prediabetes based on her elevated HgA1c and was informed this puts her at greater risk of developing diabetes. She continues to work on diet and exercise to decrease her risk of diabetes. She denies nausea or hypoglycemia.  She is not taking any medications.  Lab Results  Component Value Date   HGBA1C 5.8 (H) 10/15/2019   Lab Results  Component Value Date   INSULIN 20.8 10/27/2019   INSULIN CANCELED 10/15/2019   2. Vitamin D deficiency [name]'s Vitamin D level was 22.5 on 10/27/2019. She is currently taking Calcium + D. She denies nausea, vomiting or muscle weakness.  She endorses fatigue.  3. Other hyperlipidemia [name] has hyperlipidemia and has been trying to improve her cholesterol levels with intensive lifestyle modification including a low saturated fat diet, exercise and weight loss. She denies any chest pain, claudication or myalgias.  She is taking Crestor.  Lab Results  Component Value Date   ALT 19  10/27/2019   AST 22 10/27/2019   ALKPHOS 61 10/27/2019   BILITOT 0.3 10/27/2019   Lab Results  Component Value Date   CHOL 153 05/07/2018   HDL 61 05/07/2018   LDLCALC 75 05/07/2018   TRIG 90 05/07/2018   CHOLHDL 2.5 05/07/2018   4. Other depression, with emotional eating  [name] cut back to 50 mg of Zoloft but could not start Tritellix due to cost.  5. At risk for diabetes mellitus [name] was given approximately 15 minutes of diabetes education and counseling today. We discussed intensive lifestyle modifications today with an emphasis on weight loss as well as increasing exercise and decreasing simple carbohydrates in her diet. We also reviewed medication options with an emphasis on risk versus benefit of those discussed.   Repetitive spaced learning was employed today to elicit superior memory formation and behavioral change.  Assessment/Plan:   1. Prediabetes Category 3 or Vegetarian +400 for the next 2 weeks and then repeat labs in 3 months.  2. Vitamin D deficiency Low Vitamin D level contributes to fatigue and are associated with obesity, breast, and colon cancer. She agrees to stop OTC supplement and start prescription Vitamin D @50 ,000 IU every week and will follow-up for routine testing of Vitamin D, at least 2-3 times per year to avoid over-replacement. - Vitamin D, Ergocalciferol, (DRISDOL) 1.25 MG (50000 UNIT) CAPS capsule; Take 1 capsule (50,000 Units total) by mouth every 7 (seven) days.  Dispense: 4 capsule; Refill:  0  3. Other hyperlipidemia Will repeat labs in 3 months.  4. Other depression, with emotional eating  Continue on Zoloft 50 mg daily.  5. At risk for diabetes mellitus [name] was given approximately 15 minutes of diabetes education and counseling today. We discussed intensive lifestyle modifications today with an emphasis on weight loss as well as increasing exercise and decreasing simple carbohydrates in her diet. We also reviewed medication options  with an emphasis on risk versus benefit of those discussed.   Repetitive spaced learning was employed today to elicit superior memory formation and behavioral change.  6. Class 2 severe obesity with serious comorbidity and body mass index (BMI) of 35.0 to 35.9 in adult, unspecified obesity type [facility]) [name] is currently in the action stage of change. As such, her goal is to continue with weight loss efforts. She has agreed to the Category 3 Plan or the Vegetarian Plan +400 calories.   Exercise goals: As is.  Behavioral modification strategies: increasing lean protein intake, increasing vegetables, meal planning and cooking strategies, keeping healthy foods in the home and planning for success.  [name] has agreed to follow-up with our clinic in 2 weeks. She was informed of the importance of frequent follow-up visits to maximize her success with intensive lifestyle modifications for her multiple health conditions.   Objective:   Blood pressure (!) 104/55, pulse 73, temperature 97.9 F (36.6 C), temperature source Oral, height 5' 6"  (1.676 m), weight 218 lb (98.9 kg), SpO2 98 %. Body mass index is 35.19 kg/m.  General: Cooperative, alert, well developed, in no acute distress. HEENT: Conjunctivae and lids unremarkable. Cardiovascular: Regular rhythm.  Lungs: Normal work of breathing. Neurologic: No focal deficits.   Lab Results  Component Value Date   CREATININE 0.90 10/27/2019   BUN 21 10/27/2019   NA 143 10/27/2019   K 4.4 10/27/2019   CL 102 10/27/2019   CO2 20 10/27/2019   Lab Results  Component Value Date   ALT 19 10/27/2019   AST 22 10/27/2019   ALKPHOS 61 10/27/2019   BILITOT 0.3 10/27/2019   Lab Results  Component Value Date   HGBA1C 5.8 (H) 10/15/2019   Lab Results  Component Value Date   INSULIN 20.8 10/27/2019   INSULIN CANCELED 10/15/2019   Lab Results  Component Value Date   TSH 1.940 10/27/2019   Lab Results  Component Value Date   CHOL 153  05/07/2018   HDL 61 05/07/2018   LDLCALC 75 05/07/2018   TRIG 90 05/07/2018   CHOLHDL 2.5 05/07/2018   Lab Results  Component Value Date   WBC 6.9 08/19/2019   HGB 13.1 08/19/2019   HCT 37.8 08/19/2019   MCV 94 08/19/2019   PLT 273 08/19/2019   Lab Results  Component Value Date   IRON 85 05/31/2015   FERRITIN 55.2 03/28/2018   Attestation Statements:   Reviewed by clinician on day of visit: allergies, medications, problem list, medical history, surgical history, family history, social history, and previous encounter notes.  I, Water quality scientist, CMA, am acting as transcriptionist for Coralie Common, MD.  I have reviewed the above documentation for accuracy and completeness, and I agree with the above. - Ilene Qua, MD

## 2019-11-03 ENCOUNTER — Other Ambulatory Visit: Payer: Self-pay

## 2019-11-03 ENCOUNTER — Encounter: Payer: Self-pay | Admitting: Physician Assistant

## 2019-11-03 ENCOUNTER — Ambulatory Visit: Payer: Medicare HMO | Admitting: Rheumatology

## 2019-11-03 VITALS — BP 98/65 | HR 65 | Resp 16 | Ht 66.0 in | Wt 222.0 lb

## 2019-11-03 DIAGNOSIS — Z8679 Personal history of other diseases of the circulatory system: Secondary | ICD-10-CM | POA: Diagnosis not present

## 2019-11-03 DIAGNOSIS — E559 Vitamin D deficiency, unspecified: Secondary | ICD-10-CM | POA: Diagnosis not present

## 2019-11-03 DIAGNOSIS — Z79899 Other long term (current) drug therapy: Secondary | ICD-10-CM | POA: Diagnosis not present

## 2019-11-03 DIAGNOSIS — M170 Bilateral primary osteoarthritis of knee: Secondary | ICD-10-CM

## 2019-11-03 DIAGNOSIS — L409 Psoriasis, unspecified: Secondary | ICD-10-CM

## 2019-11-03 DIAGNOSIS — Z8719 Personal history of other diseases of the digestive system: Secondary | ICD-10-CM | POA: Diagnosis not present

## 2019-11-03 DIAGNOSIS — M7662 Achilles tendinitis, left leg: Secondary | ICD-10-CM

## 2019-11-03 DIAGNOSIS — M7661 Achilles tendinitis, right leg: Secondary | ICD-10-CM

## 2019-11-03 DIAGNOSIS — L4050 Arthropathic psoriasis, unspecified: Secondary | ICD-10-CM | POA: Diagnosis not present

## 2019-11-03 DIAGNOSIS — M5136 Other intervertebral disc degeneration, lumbar region: Secondary | ICD-10-CM | POA: Diagnosis not present

## 2019-11-03 DIAGNOSIS — M47816 Spondylosis without myelopathy or radiculopathy, lumbar region: Secondary | ICD-10-CM

## 2019-11-03 NOTE — Patient Instructions (Addendum)
Standing Labs We placed an order today for your standing lab work.    Please come back and get your standing labs in July and every 3 months  We have open lab daily Monday through Thursday from 8:30-12:30 PM and 1:30-4:30 PM and Friday from 8:30-12:30 PM and 1:30-4:00 PM at the office of Dr. Bo Merino.   You may experience shorter wait times on Monday and Friday afternoons. The office is located at [address], [place], [place], [place] [address] No appointment is necessary.   Labs are drawn by [facility].  You may receive a bill from [place] [facility] for your lab work.  If you wish to have your labs drawn at another location, please call the office 24 hours in advance to send orders.  If you have any questions regarding directions or hours of operation,  please call [phone].   Just as a reminder please drink plenty of water prior to coming for your lab work. Thanks!

## 2019-11-05 ENCOUNTER — Ambulatory Visit (INDEPENDENT_AMBULATORY_CARE_PROVIDER_SITE_OTHER): Payer: Medicare HMO | Admitting: Family Medicine

## 2019-11-05 LAB — CBC WITH DIFFERENTIAL/PLATELET
Absolute Monocytes: 594 cells/uL (ref 200–950)
Basophils Absolute: 40 cells/uL (ref 0–200)
Basophils Relative: 0.6 %
Eosinophils Absolute: 40 cells/uL (ref 15–500)
Eosinophils Relative: 0.6 %
HCT: 38.4 % (ref 35.0–45.0)
Hemoglobin: 12.6 g/dL (ref 11.7–15.5)
Lymphs Abs: 2528 cells/uL (ref 850–3900)
MCH: 32.1 pg (ref 27.0–33.0)
MCHC: 32.8 g/dL (ref 32.0–36.0)
MCV: 97.7 fL (ref 80.0–100.0)
MPV: 10.7 fL (ref 7.5–12.5)
Monocytes Relative: 9 %
Neutro Abs: 3399 cells/uL (ref 1500–7800)
Neutrophils Relative %: 51.5 %
Platelets: 315 10*3/uL (ref 140–400)
RBC: 3.93 10*6/uL (ref 3.80–5.10)
RDW: 12.5 % (ref 11.0–15.0)
Total Lymphocyte: 38.3 %
WBC: 6.6 10*3/uL (ref 3.8–10.8)

## 2019-11-05 LAB — QUANTIFERON-TB GOLD PLUS
Mitogen-NIL: >10 IU/mL
NIL: 0.52 IU/mL
QuantiFERON-TB Gold Plus: NEGATIVE
TB1-NIL: <0 IU/mL
TB2-NIL: <0 IU/mL

## 2019-11-12 ENCOUNTER — Encounter (INDEPENDENT_AMBULATORY_CARE_PROVIDER_SITE_OTHER): Payer: Self-pay | Admitting: Family Medicine

## 2019-11-12 ENCOUNTER — Ambulatory Visit (INDEPENDENT_AMBULATORY_CARE_PROVIDER_SITE_OTHER): Payer: Medicare HMO | Admitting: Family Medicine

## 2019-11-12 ENCOUNTER — Other Ambulatory Visit: Payer: Self-pay

## 2019-11-12 VITALS — BP 114/67 | HR 71 | Temp 98.1°F | Ht 66.0 in | Wt 214.0 lb

## 2019-11-12 DIAGNOSIS — Z6834 Body mass index (BMI) 34.0-34.9, adult: Secondary | ICD-10-CM

## 2019-11-12 DIAGNOSIS — E038 Other specified hypothyroidism: Secondary | ICD-10-CM | POA: Diagnosis not present

## 2019-11-12 DIAGNOSIS — E669 Obesity, unspecified: Secondary | ICD-10-CM

## 2019-11-12 DIAGNOSIS — E559 Vitamin D deficiency, unspecified: Secondary | ICD-10-CM | POA: Diagnosis not present

## 2019-11-12 MED ORDER — VITAMIN D (ERGOCALCIFEROL) 1.25 MG (50000 UNIT) PO CAPS: 50000.0000 [IU] | ORAL_CAPSULE | ORAL | 0 refills | Status: DC

## 2019-11-13 NOTE — Progress Notes (Signed)
Chief Complaint:   OBESITY [name] is here to discuss her progress with her obesity treatment plan along with follow-up of her obesity related diagnoses. [name] is on the Category 3 Plan and states she is following her eating plan approximately 100% of the time. [name] states she is walking 45 minutes 5 times per week.  Today's visit was #: 3 Starting weight: 224 lbs Starting date: 10/15/2019 Today's weight: 214 lbs Today's date: 11/12/2019 Total lbs lost to date: 10 Total lbs lost since last in-office visit: 4  Interim History: [name] feels she is getting used to eating all of the food and does feel she is starting to get hungry.  She is craving [place] and [place] food. She states she is having lots of cravings in the evening after dinner.  Subjective:   Vitamin D deficiency.  No nausea, vomiting, or muscle weakness. [name] endorses fatigue. She is on prescription Vitamin D. Last Vitamin D 22.5 on 10/27/2019.  Other specified hypothyroidism. No cold intolerance, heat intolerance, or palpitations. [name] is on levothyroxine. Last TSH was within normal limits.   Lab Results  Component Value Date   TSH 1.940 10/27/2019   Assessment/Plan:   Vitamin D deficiency. Low Vitamin D level contributes to fatigue and are associated with obesity, breast, and colon cancer. She was given a refill on her Vitamin D, Ergocalciferol, (DRISDOL) 1.25 MG (50000 UNIT) CAPS capsule every week #4 with 0 refills and will follow-up for routine testing of Vitamin D, at least 2-3 times per year to avoid over-replacement.    Other specified hypothyroidism. Patient with long-standing hypothyroidism, on levothyroxine therapy. She appears euthyroid. Orders and follow up as documented in patient record. [name] will continue her levothyroxine as directed (no refill needed).  Counseling . Good thyroid control is important for overall health. Supratherapeutic thyroid levels are dangerous and will not  improve weight loss results. . The correct way to take levothyroxine is fasting, with water, separated by at least 30 minutes from breakfast, and separated by more than 4 hours from calcium, iron, multivitamins, acid reflux medications (PPIs).   Class 1 obesity with serious comorbidity and body mass index (BMI) of 34.0 to 34.9 in adult, unspecified obesity type.  [name] is currently in the action stage of change. As such, her goal is to continue with weight loss efforts. She has agreed to the Category 4 Plan.   Exercise goals: [name] will continue her current exercise regimen.  Behavioral modification strategies: increasing lean protein intake, meal planning and cooking strategies, keeping healthy foods in the home, better snacking choices and planning for success.  [name] has agreed to follow-up with our clinic in 2 weeks. She was informed of the importance of frequent follow-up visits to maximize her success with intensive lifestyle modifications for her multiple health conditions.   Objective:   Blood pressure 114/67, pulse 71, temperature 98.1 F (36.7 C), temperature source Oral, height 5' 6"  (1.676 m), weight 214 lb (97.1 kg), SpO2 95 %. Body mass index is 34.54 kg/m.  General: Cooperative, alert, well developed, in no acute distress. HEENT: Conjunctivae and lids unremarkable. Cardiovascular: Regular rhythm.  Lungs: Normal work of breathing. Neurologic: No focal deficits.   Lab Results  Component Value Date   CREATININE 0.90 10/27/2019   BUN 21 10/27/2019   NA 143 10/27/2019   K 4.4 10/27/2019   CL 102 10/27/2019   CO2 20 10/27/2019   Lab Results  Component Value Date   ALT 19 10/27/2019  AST 22 10/27/2019   ALKPHOS 61 10/27/2019   BILITOT 0.3 10/27/2019   Lab Results  Component Value Date   HGBA1C 5.8 (H) 10/15/2019   Lab Results  Component Value Date   INSULIN 20.8 10/27/2019   INSULIN CANCELED 10/15/2019   Lab Results  Component Value Date   TSH 1.940  10/27/2019   Lab Results  Component Value Date   CHOL 153 05/07/2018   HDL 61 05/07/2018   LDLCALC 75 05/07/2018   TRIG 90 05/07/2018   CHOLHDL 2.5 05/07/2018   Lab Results  Component Value Date   WBC 6.6 11/03/2019   HGB 12.6 11/03/2019   HCT 38.4 11/03/2019   MCV 97.7 11/03/2019   PLT 315 11/03/2019   Lab Results  Component Value Date   IRON 85 05/31/2015   FERRITIN 55.2 03/28/2018   Obesity Behavioral Intervention Documentation for Insurance:   Approximately 15 minutes were spent on the discussion below.  ASK: We discussed the diagnosis of obesity with [name] today and [name] agreed to give [place] permission to discuss obesity behavioral modification therapy today.  ASSESS: [name] has the diagnosis of obesity and her BMI today is 34.6. [name] is in the action stage of change.   ADVISE: [name] was educated on the multiple health risks of obesity as well as the benefit of weight loss to improve her health. She was advised of the need for long term treatment and the importance of lifestyle modifications to improve her current health and to decrease her risk of future health problems.  AGREE: Multiple dietary modification options and treatment options were discussed and [name] agreed to follow the recommendations documented in the above note.  ARRANGE: [name] was educated on the importance of frequent visits to treat obesity as outlined per CMS and USPSTF guidelines and agreed to schedule her next follow up appointment today.  Attestation Statements:   Reviewed by clinician on day of visit: allergies, medications, problem list, medical history, surgical history, family history, social history, and previous encounter notes.  I, Michaelene Song, am acting as transcriptionist for Coralie Common, MD   I have reviewed the above documentation for accuracy and completeness, and I agree with the above. - Ilene Qua, MD

## 2019-11-19 ENCOUNTER — Telehealth: Payer: Self-pay | Admitting: Emergency Medicine

## 2019-11-19 ENCOUNTER — Other Ambulatory Visit: Payer: Self-pay | Admitting: Gastroenterology

## 2019-11-19 NOTE — Telephone Encounter (Signed)
Pt was supposed to be referred to lung cancer screening at 08/06/19 OV with [facility] but referral was not placed.    lmtcb for pt.

## 2019-11-20 NOTE — Telephone Encounter (Signed)
LMTCB x2 for pt 

## 2019-11-25 NOTE — Telephone Encounter (Signed)
LMTCB x3 for pt. We have attempted to contact pt several times with no success or call back from pt. Per triage protocol, message will be closed.   

## 2019-11-27 ENCOUNTER — Other Ambulatory Visit: Payer: Self-pay | Admitting: Physician Assistant

## 2019-11-27 NOTE — Telephone Encounter (Signed)
Last Visit: 11/03/2019 Next Visit: 04/05/2020 Labs: 10/27/2019 WNL  Current Dose per office note 11/03/2019: MTX 0.8 ml sq once weekly  Okay to refill per Dr. Estanislado Pandy

## 2019-12-02 ENCOUNTER — Other Ambulatory Visit: Payer: Self-pay | Admitting: Rheumatology

## 2019-12-03 NOTE — Telephone Encounter (Signed)
Last Visit: 11/03/2019 Next Visit: 04/05/2020 Labs: 10/27/2019 CMP WNL, 11/03/2019 CBC WNL  TB Gold: 11/03/2019 negative   Okay to refill per Dr. Estanislado Pandy.

## 2019-12-04 ENCOUNTER — Ambulatory Visit (INDEPENDENT_AMBULATORY_CARE_PROVIDER_SITE_OTHER): Payer: Medicare HMO | Admitting: Family Medicine

## 2019-12-04 ENCOUNTER — Encounter (INDEPENDENT_AMBULATORY_CARE_PROVIDER_SITE_OTHER): Payer: Self-pay | Admitting: Family Medicine

## 2019-12-04 ENCOUNTER — Other Ambulatory Visit: Payer: Self-pay

## 2019-12-04 VITALS — BP 104/49 | HR 69 | Temp 97.9°F | Ht 66.0 in | Wt 214.0 lb

## 2019-12-04 DIAGNOSIS — Z6834 Body mass index (BMI) 34.0-34.9, adult: Secondary | ICD-10-CM | POA: Diagnosis not present

## 2019-12-04 DIAGNOSIS — E669 Obesity, unspecified: Secondary | ICD-10-CM

## 2019-12-04 DIAGNOSIS — Z9189 Other specified personal risk factors, not elsewhere classified: Secondary | ICD-10-CM

## 2019-12-04 DIAGNOSIS — R7303 Prediabetes: Secondary | ICD-10-CM

## 2019-12-04 DIAGNOSIS — E559 Vitamin D deficiency, unspecified: Secondary | ICD-10-CM

## 2019-12-04 MED ORDER — VITAMIN D (ERGOCALCIFEROL) 1.25 MG (50000 UNIT) PO CAPS: 50000.0000 [IU] | ORAL_CAPSULE | ORAL | 0 refills | Status: DC

## 2019-12-08 NOTE — Progress Notes (Signed)
Chief Complaint:   OBESITY [name] is here to discuss her progress with her obesity treatment plan along with follow-up of her obesity related diagnoses. [name] is on the Category 4 Plan and states she is following her eating plan approximately 98% of the time. [name] states she is walking 45 minutes 2-3 times per week.  Today's visit was #: 4 Starting weight: 224 lbs Starting date: 10/15/2019 Today's weight: 214 lbs Today's date: 12/04/2019 Total lbs lost to date: 10 Total lbs lost since last in-office visit: 0  Interim History: Food-wise, [name] reports eating within plan. Breakfast is 3 eggs (cereal option 2 times per week) and occasionally will have 2 pieces of toast; Lunch is low sodium deli [place], cheese stick, wrap, yogurt, and an apple; Snacks are Kind bars, celery and carrots; Dinner is meat and vegetables (reports getting 8-10 oz at dinner).  Subjective:   Prediabetes. [name] has a diagnosis of prediabetes based on her elevated HgA1c and was informed this puts her at greater risk of developing diabetes. She continues to work on diet and exercise to decrease her risk of diabetes. She denies nausea or hypoglycemia. [name] is not on metformin.  Lab Results  Component Value Date   HGBA1C 5.8 (H) 10/15/2019   Lab Results  Component Value Date   INSULIN 20.8 10/27/2019   INSULIN CANCELED 10/15/2019   Vitamin D deficiency. No nausea, vomiting, or muscle weakness. [name] endorses fatigue. She is taking prescription Vitamin D. Last Vitamin D 22.5 on 10/27/2019.  At risk for osteoporosis. [name] is at higher risk of osteopenia and osteoporosis due to Vitamin D deficiency.   Assessment/Plan:   Prediabetes. [name] will continue to work on weight loss, exercise, and decreasing simple carbohydrates to help decrease the risk of diabetes. We will follow-up with [name] at her next appointment. If weight loss stalls of cravings increase, will start metformin.  Vitamin D  deficiency. Low Vitamin D level contributes to fatigue and are associated with obesity, breast, and colon cancer. She was given a refill on her Vitamin D, Ergocalciferol, (DRISDOL) 1.25 MG (50000 UNIT) CAPS capsule every week #4 with 0 refills and will follow-up for routine testing of Vitamin D, at least 2-3 times per year to avoid over-replacement.    At risk for osteoporosis. [name] was given approximately 15 minutes of osteoporosis prevention counseling today. [name] is at risk for osteopenia and osteoporosis due to her Vitamin D deficiency. She was encouraged to take her Vitamin D and follow her higher calcium diet and increase strengthening exercise to help strengthen her bones and decrease her risk of osteopenia and osteoporosis.  Repetitive spaced learning was employed today to elicit superior memory formation and behavioral change.  Class 1 obesity with serious comorbidity and body mass index (BMI) of 34.0 to 34.9 in adult, unspecified obesity type.  [name] is currently in the action stage of change. As such, her goal is to continue with weight loss efforts. She has agreed to the Category 4 Plan.   Exercise goals: For substantial health benefits, adults should do at least 150 minutes (2 hours and 30 minutes) a week of moderate-intensity, or 75 minutes (1 hour and 15 minutes) a week of vigorous-intensity aerobic physical activity, or an equivalent combination of moderate- and vigorous-intensity aerobic activity. Aerobic activity should be performed in episodes of at least 10 minutes, and preferably, it should be spread throughout the week.  Behavioral modification strategies: increasing lean protein intake, increasing vegetables, meal planning and cooking  strategies, keeping healthy foods in the home and planning for success.  [name] has agreed to follow-up with our clinic in 2 weeks. She was informed of the importance of frequent follow-up visits to maximize her success with intensive lifestyle  modifications for her multiple health conditions.   Objective:   Blood pressure (!) 104/49, pulse 69, temperature 97.9 F (36.6 C), temperature source Oral, height 5' 6"  (1.676 m), weight 214 lb (97.1 kg), SpO2 98 %. Body mass index is 34.54 kg/m.  General: Cooperative, alert, well developed, in no acute distress. HEENT: Conjunctivae and lids unremarkable. Cardiovascular: Regular rhythm.  Lungs: Normal work of breathing. Neurologic: No focal deficits.   Lab Results  Component Value Date   CREATININE 0.90 10/27/2019   BUN 21 10/27/2019   NA 143 10/27/2019   K 4.4 10/27/2019   CL 102 10/27/2019   CO2 20 10/27/2019   Lab Results  Component Value Date   ALT 19 10/27/2019   AST 22 10/27/2019   ALKPHOS 61 10/27/2019   BILITOT 0.3 10/27/2019   Lab Results  Component Value Date   HGBA1C 5.8 (H) 10/15/2019   Lab Results  Component Value Date   INSULIN 20.8 10/27/2019   INSULIN CANCELED 10/15/2019   Lab Results  Component Value Date   TSH 1.940 10/27/2019   Lab Results  Component Value Date   CHOL 153 05/07/2018   HDL 61 05/07/2018   LDLCALC 75 05/07/2018   TRIG 90 05/07/2018   CHOLHDL 2.5 05/07/2018   Lab Results  Component Value Date   WBC 6.6 11/03/2019   HGB 12.6 11/03/2019   HCT 38.4 11/03/2019   MCV 97.7 11/03/2019   PLT 315 11/03/2019   Lab Results  Component Value Date   IRON 85 05/31/2015   FERRITIN 55.2 03/28/2018   Attestation Statements:   Reviewed by clinician on day of visit: allergies, medications, problem list, medical history, surgical history, family history, social history, and previous encounter notes.  I, Michaelene Song, am acting as transcriptionist for Coralie Common, MD   I have reviewed the above documentation for accuracy and completeness, and I agree with the above. - Jinny Blossom, MD

## 2019-12-10 ENCOUNTER — Encounter (INDEPENDENT_AMBULATORY_CARE_PROVIDER_SITE_OTHER): Payer: Self-pay

## 2019-12-10 ENCOUNTER — Encounter: Payer: Self-pay | Admitting: Gastroenterology

## 2019-12-23 ENCOUNTER — Other Ambulatory Visit: Payer: Self-pay

## 2019-12-23 ENCOUNTER — Ambulatory Visit (INDEPENDENT_AMBULATORY_CARE_PROVIDER_SITE_OTHER): Payer: Medicare HMO | Admitting: Adult Health

## 2019-12-23 VITALS — BP 104/66 | HR 70 | Temp 98.1°F | Ht 66.0 in | Wt 212.0 lb

## 2019-12-23 DIAGNOSIS — E559 Vitamin D deficiency, unspecified: Secondary | ICD-10-CM

## 2019-12-23 DIAGNOSIS — Z6834 Body mass index (BMI) 34.0-34.9, adult: Secondary | ICD-10-CM

## 2019-12-23 DIAGNOSIS — I10 Essential (primary) hypertension: Secondary | ICD-10-CM | POA: Diagnosis not present

## 2019-12-23 DIAGNOSIS — E669 Obesity, unspecified: Secondary | ICD-10-CM

## 2019-12-23 DIAGNOSIS — F3289 Other specified depressive episodes: Secondary | ICD-10-CM | POA: Diagnosis not present

## 2019-12-23 MED ORDER — VITAMIN D (ERGOCALCIFEROL) 1.25 MG (50000 UNIT) PO CAPS: 50000.0000 [IU] | ORAL_CAPSULE | ORAL | 0 refills | Status: DC

## 2019-12-24 DIAGNOSIS — E559 Vitamin D deficiency, unspecified: Secondary | ICD-10-CM | POA: Insufficient documentation

## 2019-12-24 DIAGNOSIS — F32A Depression, unspecified: Secondary | ICD-10-CM | POA: Insufficient documentation

## 2019-12-24 NOTE — Progress Notes (Signed)
Chief Complaint:   OBESITY [name] is here to discuss her progress with her obesity treatment plan along with follow-up of her obesity related diagnoses. [name] is on the Category 4 Plan and states she is following her eating plan approximately 95% of the time. [name] states she is walking for 45-60 minutes 2-3 times per week.  Today's visit was #: 5 Starting weight: 224 lbs Starting date: 10/15/2019 Today's weight: 212 lbs Today's date: 12/23/2019 Total lbs lost to date: 12 Total lbs lost since last in-office visit: 2  Interim History: [name] denies excessive hunger levels and will even struggle to consume all of the protein with dinner. She is frustrated that her weight loss has been slow but she remains very committed to following the plan consistently.  Subjective:   1. Vitamin D deficiency [name]'s Vit D level on 10/27/2019 was 22.5. She is on prescription strength Vit D supplementation, and is tolerating it well.  2. Essential hypertension [name]'s blood pressure is well controlled at her office visit today. She is on metoprolol 50 mg q daily. She denies excessive fatigue.  3. Other depression, with emotional eating  [name] reduced sertraline to 100 mg 1/2 tablet q daily, and she feels that her sexual dysfunction has decreased. She reports her mood is stable on the decreased SSRI dose.  Assessment/Plan:   1. Vitamin D deficiency Low Vitamin D level contributes to fatigue and are associated with obesity, breast, and colon cancer. We will refill prescription Vitamin D for 1 month. [name] will follow-up for routine testing of Vitamin D, at least 2-3 times per year to avoid over-replacement. We will recheck labs in July 2021.  - Vitamin D, Ergocalciferol, (DRISDOL) 1.25 MG (50000 UNIT) CAPS capsule; Take 1 capsule (50,000 Units total) by mouth every 7 (seven) days.  Dispense: 4 capsule; Refill: 0  2. Essential hypertension [name] will continue her beta blocker regimen, and will  continue her Category 4 meal plan. She is working on healthy weight loss and exercise to improve blood pressure control. We will watch for signs of hypotension as she continues her lifestyle modifications.  3. Other depression, with emotional eating  Behavior modification techniques were discussed today to help [name] deal with her emotional/non-hunger eating behaviors. [name] will continue with sertraline 100 mg 1/2 tablet q daily, and will continue regular exercise. Orders and follow up as documented in patient record.   4. Class 1 obesity with serious comorbidity and body mass index (BMI) of 34.0 to 34.9 in adult, unspecified obesity type [name] is currently in the action stage of change. As such, her goal is to continue with weight loss efforts. She has agreed to the Category 4 Plan.   Handout provided: Protein Equivalents.  Exercise goals: As is.  Behavioral modification strategies: increasing lean protein intake, decreasing simple carbohydrates, increasing water intake, no skipping meals, meal planning and cooking strategies, travel eating strategies and celebration eating strategies.  [name] has agreed to follow-up with our clinic in 2 to 3 weeks. She was informed of the importance of frequent follow-up visits to maximize her success with intensive lifestyle modifications for her multiple health conditions.   Objective:   Blood pressure 104/66, pulse 70, temperature 98.1 F (36.7 C), temperature source Oral, height 5' 6"  (1.676 m), weight 212 lb (96.2 kg), SpO2 97 %. Body mass index is 34.22 kg/m.  General: Cooperative, alert, well developed, in no acute distress. HEENT: Conjunctivae and lids unremarkable. Cardiovascular: Regular rhythm.  Lungs: Normal work of breathing.  Neurologic: No focal deficits.   Lab Results  Component Value Date   CREATININE 0.90 10/27/2019   BUN 21 10/27/2019   NA 143 10/27/2019   K 4.4 10/27/2019   CL 102 10/27/2019   CO2 20 10/27/2019   Lab  Results  Component Value Date   ALT 19 10/27/2019   AST 22 10/27/2019   ALKPHOS 61 10/27/2019   BILITOT 0.3 10/27/2019   Lab Results  Component Value Date   HGBA1C 5.8 (H) 10/15/2019   Lab Results  Component Value Date   INSULIN 20.8 10/27/2019   INSULIN CANCELED 10/15/2019   Lab Results  Component Value Date   TSH 1.940 10/27/2019   Lab Results  Component Value Date   CHOL 153 05/07/2018   HDL 61 05/07/2018   LDLCALC 75 05/07/2018   TRIG 90 05/07/2018   CHOLHDL 2.5 05/07/2018   Lab Results  Component Value Date   WBC 6.6 11/03/2019   HGB 12.6 11/03/2019   HCT 38.4 11/03/2019   MCV 97.7 11/03/2019   PLT 315 11/03/2019   Lab Results  Component Value Date   IRON 85 05/31/2015   FERRITIN 55.2 03/28/2018    Obesity Behavioral Intervention Documentation for Insurance:   Approximately 15 minutes were spent on the discussion below.  ASK: We discussed the diagnosis of obesity with [name] today and [name] agreed to give [place] permission to discuss obesity behavioral modification therapy today.  ASSESS: [name] has the diagnosis of obesity and her BMI today is 34.23. [name] is in the action stage of change.   ADVISE: [name] was educated on the multiple health risks of obesity as well as the benefit of weight loss to improve her health. She was advised of the need for long term treatment and the importance of lifestyle modifications to improve her current health and to decrease her risk of future health problems.  AGREE: Multiple dietary modification options and treatment options were discussed and [name] agreed to follow the recommendations documented in the above note.  ARRANGE: [name] was educated on the importance of frequent visits to treat obesity as outlined per CMS and USPSTF guidelines and agreed to schedule her next follow up appointment today.  Attestation Statements:   Reviewed by clinician on day of visit: allergies, medications, problem list, medical  history, surgical history, family history, social history, and previous encounter notes.   Wilhemena Durie, am acting as Location manager for [employer], NP-C.  I have reviewed the above documentation for accuracy and completeness, and I agree with the above. -  Esaw Grandchild, NP

## 2020-01-06 ENCOUNTER — Other Ambulatory Visit (INDEPENDENT_AMBULATORY_CARE_PROVIDER_SITE_OTHER): Payer: Self-pay | Admitting: Family Medicine

## 2020-01-06 DIAGNOSIS — E559 Vitamin D deficiency, unspecified: Secondary | ICD-10-CM

## 2020-01-13 ENCOUNTER — Encounter (INDEPENDENT_AMBULATORY_CARE_PROVIDER_SITE_OTHER): Payer: Self-pay | Admitting: Adult Health

## 2020-01-13 ENCOUNTER — Ambulatory Visit (INDEPENDENT_AMBULATORY_CARE_PROVIDER_SITE_OTHER): Payer: Medicare HMO | Admitting: Adult Health

## 2020-01-13 ENCOUNTER — Encounter (INDEPENDENT_AMBULATORY_CARE_PROVIDER_SITE_OTHER): Payer: Self-pay

## 2020-01-13 ENCOUNTER — Ambulatory Visit (INDEPENDENT_AMBULATORY_CARE_PROVIDER_SITE_OTHER): Payer: Medicare HMO | Admitting: Family Medicine

## 2020-01-13 ENCOUNTER — Other Ambulatory Visit: Payer: Self-pay

## 2020-01-13 VITALS — BP 120/75 | HR 71 | Temp 98.0°F | Ht 66.0 in | Wt 213.0 lb

## 2020-01-13 DIAGNOSIS — E559 Vitamin D deficiency, unspecified: Secondary | ICD-10-CM | POA: Diagnosis not present

## 2020-01-13 DIAGNOSIS — R7303 Prediabetes: Secondary | ICD-10-CM | POA: Diagnosis not present

## 2020-01-13 DIAGNOSIS — Z6834 Body mass index (BMI) 34.0-34.9, adult: Secondary | ICD-10-CM | POA: Diagnosis not present

## 2020-01-13 DIAGNOSIS — E669 Obesity, unspecified: Secondary | ICD-10-CM | POA: Diagnosis not present

## 2020-01-13 MED ORDER — VITAMIN D (ERGOCALCIFEROL) 1.25 MG (50000 UNIT) PO CAPS: 50000.0000 [IU] | ORAL_CAPSULE | ORAL | 0 refills | Status: DC

## 2020-01-13 NOTE — Progress Notes (Signed)
Chief Complaint:   OBESITY [name] is here to discuss her progress with her obesity treatment plan along with follow-up of her obesity related diagnoses. [name] is on the Category 4 Plan and states she is following her eating plan approximately 90-95% of the time. [name] states she is exercising 0 minutes 0 times per week.  Today's visit was #: 6 Starting weight: 224 lbs Starting date: 10/15/2019 Today's weight: 213 lbs Today's date: 01/13/2020 Total lbs lost to date: 11 Total lbs lost since last in-office visit: 0  Interim History: [name] recently slipped on wet grass and fell on her right hip/right hamstring. She was able to stand up on her own. She denies striking her head or loss of consciousness. She has been resting, icing, applying an ACE wrap, and elevating the injured right hamstring. They are traveling to [place] for 2 weeks )on 01/23/20) and are staying at a [facility]. She is already formulating a shopping list to stay on plan during the vacation - fantastic!  Subjective:   Vitamin D deficiency. [name] is on prescription strength Vitamin D supplementation, which she is tolerating well with no medication side effects.   Ref. Range 10/27/2019 15:12  Vitamin D, 25-Hydroxy Latest Ref Range: 30.0 - 100.0 ng/mL 22.5 (L)   Prediabetes. [name] has a diagnosis of prediabetes based on her elevated HgA1c and was informed this puts her at greater risk of developing diabetes. She continues to work on diet and exercise to decrease her risk of diabetes. She denies nausea or hypoglycemia. [name] is not on metformin and denies polyphagia.  Lab Results  Component Value Date   HGBA1C 5.8 (H) 10/15/2019   Lab Results  Component Value Date   INSULIN 20.8 10/27/2019   INSULIN CANCELED 10/15/2019   Assessment/Plan:   Vitamin D deficiency. Low Vitamin D level contributes to fatigue and are associated with obesity, breast, and colon cancer. She was given a refill on her Vitamin D,  Ergocalciferol, (DRISDOL) 1.25 MG (50000 UNIT) CAPS capsule every week #4 with 0 refills and will follow-up for routine testing of Vitamin D at her next office visit.   Prediabetes. [name] will continue to work on weight loss, exercise, and decreasing simple carbohydrates to help decrease the risk of diabetes. Will have labs checked at her next office visit.  Class 1 obesity with serious comorbidity and body mass index (BMI) of 34.0 to 34.9 in adult, unspecified obesity type.  [name] is currently in the action stage of change. As such, her goal is to continue with weight loss efforts. She has agreed to the Category 4 Plan.   Exercise goals: No exercise has been prescribed at this time.  Behavioral modification strategies: increasing lean protein intake, meal planning and cooking strategies, travel eating strategies and celebration eating strategies.  [name] has agreed to follow-up with our clinic in 3 weeks. She was informed of the importance of frequent follow-up visits to maximize her success with intensive lifestyle modifications for her multiple health conditions.   Objective:   Blood pressure 120/75, pulse 71, temperature 98 F (36.7 C), temperature source Oral, height 5' 6"  (1.676 m), weight 213 lb (96.6 kg), SpO2 100 %. Body mass index is 34.38 kg/m.  General: Cooperative, alert, well developed, in no acute distress. HEENT: Conjunctivae and lids unremarkable. Cardiovascular: Regular rhythm.  Lungs: Normal work of breathing. Neurologic: No focal deficits.   Lab Results  Component Value Date   CREATININE 0.90 10/27/2019   BUN 21 10/27/2019  NA 143 10/27/2019   Chavez 4.4 10/27/2019   CL 102 10/27/2019   CO2 20 10/27/2019   Lab Results  Component Value Date   ALT 19 10/27/2019   AST 22 10/27/2019   ALKPHOS 61 10/27/2019   BILITOT 0.3 10/27/2019   Lab Results  Component Value Date   HGBA1C 5.8 (H) 10/15/2019   Lab Results  Component Value Date   INSULIN 20.8 10/27/2019    INSULIN CANCELED 10/15/2019   Lab Results  Component Value Date   TSH 1.940 10/27/2019   Lab Results  Component Value Date   CHOL 153 05/07/2018   HDL 61 05/07/2018   LDLCALC 75 05/07/2018   TRIG 90 05/07/2018   CHOLHDL 2.5 05/07/2018   Lab Results  Component Value Date   WBC 6.6 11/03/2019   HGB 12.6 11/03/2019   HCT 38.4 11/03/2019   MCV 97.7 11/03/2019   PLT 315 11/03/2019   Lab Results  Component Value Date   IRON 85 05/31/2015   FERRITIN 55.2 03/28/2018   Obesity Behavioral Intervention Documentation for Insurance:   Approximately 15 minutes were spent on the discussion below.  ASK: We discussed the diagnosis of obesity with [name] today and [name] agreed to give [place] permission to discuss obesity behavioral modification therapy today.  ASSESS: [name] has the diagnosis of obesity and her BMI today is 34.5. [name] is in the action stage of change.   ADVISE: [name] was educated on the multiple health risks of obesity as well as the benefit of weight loss to improve her health. She was advised of the need for long term treatment and the importance of lifestyle modifications to improve her current health and to decrease her risk of future health problems.  AGREE: Multiple dietary modification options and treatment options were discussed and [name] agreed to follow the recommendations documented in the above note.  ARRANGE: [name] was educated on the importance of frequent visits to treat obesity as outlined per CMS and USPSTF guidelines and agreed to schedule her next follow up appointment today.  Attestation Statements:   Reviewed by clinician on day of visit: allergies, medications, problem list, medical history, surgical history, family history, social history, and previous encounter notes.  I, Michaelene Song, am acting as Location manager for [employer], NP-C   I have reviewed the above documentation for accuracy and completeness, and I agree with the above. -   Esaw Grandchild, NP

## 2020-01-14 DIAGNOSIS — K219 Gastro-esophageal reflux disease without esophagitis: Secondary | ICD-10-CM | POA: Diagnosis not present

## 2020-01-14 DIAGNOSIS — N6019 Diffuse cystic mastopathy of unspecified breast: Secondary | ICD-10-CM | POA: Diagnosis not present

## 2020-01-14 DIAGNOSIS — N289 Disorder of kidney and ureter, unspecified: Secondary | ICD-10-CM | POA: Diagnosis not present

## 2020-01-14 DIAGNOSIS — Z0000 Encounter for general adult medical examination without abnormal findings: Secondary | ICD-10-CM | POA: Diagnosis not present

## 2020-01-14 DIAGNOSIS — R000 Tachycardia, unspecified: Secondary | ICD-10-CM | POA: Diagnosis not present

## 2020-01-14 DIAGNOSIS — E7800 Pure hypercholesterolemia, unspecified: Secondary | ICD-10-CM | POA: Diagnosis not present

## 2020-01-14 DIAGNOSIS — E039 Hypothyroidism, unspecified: Secondary | ICD-10-CM | POA: Diagnosis not present

## 2020-01-14 DIAGNOSIS — I10 Essential (primary) hypertension: Secondary | ICD-10-CM | POA: Diagnosis not present

## 2020-01-14 DIAGNOSIS — Z6835 Body mass index (BMI) 35.0-35.9, adult: Secondary | ICD-10-CM | POA: Diagnosis not present

## 2020-01-21 ENCOUNTER — Telehealth: Payer: Self-pay | Admitting: Rheumatology

## 2020-01-21 ENCOUNTER — Ambulatory Visit
Admission: EM | Admit: 2020-01-21 | Discharge: 2020-01-21 | Disposition: A | Discharge status: Home / Self Care | Payer: Medicare HMO

## 2020-01-21 DIAGNOSIS — R591 Generalized enlarged lymph nodes: Secondary | ICD-10-CM

## 2020-01-21 NOTE — ED Triage Notes (Signed)
Patient reports she noticed a bump on the right side of her neck behind her ear. Reports she gave it one day to see what would happen; no changes. Denies pain unless pressing hard on the bump. Is concerned because she is immunosuppressed.   Patient is leaving for a vacation and request if she is prescribed any abx, please prescribe diflucan.

## 2020-01-21 NOTE — Telephone Encounter (Signed)
Patient states she woke up on 01/20/2020 and states "it felt weird when she turned her head." Patient states she reached behind her right ear and there was a quarter sized lump there. Patient states "it is squishy." Patient states it is only painful when she pushes on it. Patient denies any injury. Patient is on [place]. Patient states she does not fell sick. Patient states she flies out of town Friday and will be there for 2 weeks. Please advise.

## 2020-01-21 NOTE — Telephone Encounter (Signed)
Please advise the patient to follow up with PCP for further evaluation prior to leaving Friday.

## 2020-01-21 NOTE — Telephone Encounter (Signed)
Patient called stating she has a knot on her head behind her right ear.  Patient states there is no redness, but it is sore when she presses on it.  Patient denies any trauma to her head and states the bump is not hard.  Patient is worried it could be from the medications she is taking and requesting a return call.

## 2020-01-21 NOTE — ED Provider Notes (Signed)
[name]    CSN: [id number] Arrival date & time: 01/21/20  1330      History   Chief Complaint Chief Complaint  Patient presents with  . Mass    HPI [name] is a 54 y.o. female.   Patient presents with a swollen mass on her neck at the base of scalp behind her right ear x1 day.  No falls or injury.  She denies rash, lesions, redness, bruising, neck pain, fever, chills, ear pain, sore throat, cough, shortness of breath, vomiting, diarrhea, or other symptoms.  Patient is concerned because she is on methotrexate and her immune system is compromised.    The history is provided by the patient.    Past Medical History:  Diagnosis Date  . Allergy   . Anxiety   . Arthritis   . Asthma   . Back pain   . Cataract    both eyes  surgically repaired  . COPD (chronic obstructive pulmonary disease) ([place])   . Depression    treated  . Dyspnea   . Fibrocystic breast disease   . GERD (gastroesophageal reflux disease)   . History of hiatal hernia    repaired  . History of kidney stones    questionable  . Hyperlipidemia   . Hypertension    pt denies  . Hypothyroidism   . Irritable bowel syndrome   . Joint pain   . Osteoarthritis   . Ovarian cyst   . Palpitations   . Pancreatitis   . PONV (postoperative nausea and vomiting)    NO TROUBLE WITH CATARACT SURGERY  . Psoriasis   . Psoriatic arthritis ([place]) 05/12/2016   Poor response to Humira Inadequate response to Enbrel Inadequate response to Simponi  . Shortness of breath   . Tachycardia   . Ulcerative colitis     Patient Active Problem List   Diagnosis Date Noted  . Prediabetes 01/13/2020  . Vitamin D deficiency 12/24/2019  . Depression 12/24/2019  . Class 1 obesity with serious comorbidity and body mass index (BMI) of 34.0 to 34.9 in adult 12/24/2019  . Tobacco use 08/06/2019  . Allergic rhinitis 04/19/2018  . Obstructive sleep apnea syndrome, mild 04/19/2018  . COPD (chronic obstructive pulmonary  disease) ([place]) 03/26/2018  . Rectal bleeding 01/22/2018  . LLQ abdominal pain 01/22/2018  . Dysphagia 01/12/2017  . 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 Primary osteoarthritis of both knees 08/11/2016  . History of hypertension 08/11/2016  . History of ulcerative colitis 08/11/2016  . History of IBS 08/11/2016  . 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 High risk medications (not anticoagulants) long-term use 05/13/2016  . Knee pain, bilateral 05/13/2016  . Anserine bursitis 05/13/2016  . 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 Chondromalacia patellae, right knee 05/13/2016  . Psoriatic arthritis ([place]) 05/12/2016  . UNSPECIFIED HYPOTHYROIDISM 08/24/2009  . HYPERLIPIDEMIA 08/24/2009  . Essential hypertension 08/24/2009  . GERD 08/24/2009  . Ulcerative colitis ([place]) 08/24/2009  . IRRITABLE BOWEL SYNDROME 08/24/2009  . OVARIAN CYST 08/24/2009  . PSORIASIS 08/24/2009  . ARTHRITIS 08/24/2009  . TACHYCARDIA, HX OF 08/24/2009  . PANCREATITIS, HX OF 08/24/2009  . FIBROCYSTIC BREAST DISEASE, HX OF 08/24/2009    Past Surgical History:  Procedure Laterality Date  . ABDOMINAL HYSTERECTOMY    . ANKLE RECONSTRUCTION     left  . bladder tuck    . CATARACT EXTRACTION W/PHACO Right 09/11/2017   Procedure: CATARACT EXTRACTION PHACO AND INTRAOCULAR LENS PLACEMENT (IOC);  Surgeon: Birder Robson, MD;  Location: [facility];  Service: Ophthalmology;  Laterality: Right;  [place] 00:18.5 AP% 6.9 CDE 1.29 Fluid Pack Lot # [id number]  . CATARACT EXTRACTION W/PHACO Left 10/02/2017   Procedure: CATARACT EXTRACTION PHACO AND INTRAOCULAR LENS PLACEMENT ([place]);  Surgeon: Birder Robson, MD;  Location: [facility];  Service: Ophthalmology;  Laterality: Left;  [place] 00:25.9 AP% 6.9 CDE 1.80 Fluid Pack Lot # [id number]  . CLOSED REDUCTION NASAL FRACTURE N/A 05/21/2018   Procedure: CLOSED REDUCTION NASAL FRACTURE;  Surgeon: Clyde Canterbury, MD;  Location: [facility];   Service: ENT;  Laterality: N/A;  Latex sensitivity  . COLONOSCOPY    . EYE SURGERY     Lasik  . FRACTURE SURGERY    . KNEE SURGERY     left  . LAPAROSCOPIC NISSEN FUNDOPLICATION N/A 5/73/2202   Procedure: LAPAROSCOPIC NISSEN TAKEDOWN AND UPPER ENDOSCOPY;  Surgeon: Johnathan Hausen, MD;  Location: [facility];  Service: General;  Laterality: N/A;  . NASAL SEPTUM SURGERY    . NISSEN FUNDOPLICATION    . PARTIAL HYSTERECTOMY    . POLYPECTOMY    . repair cystocele and rectocele    . thyroid ablation    . TUBAL LIGATION    . UPPER GASTROINTESTINAL ENDOSCOPY    . UPPER GI ENDOSCOPY  01/12/2017   Procedure: UPPER GI ENDOSCOPY;  Surgeon: Johnathan Hausen, MD;  Location: [facility];  Service: General;;    OB History    Gravida  2   Para      Term      Preterm      AB      Living        SAB      TAB      Ectopic      Multiple      Live Births               Home Medications    Prior to Admission medications   Medication Sig Start Date End Date Taking? Authorizing Provider  aspirin-acetaminophen-caffeine (EXCEDRIN MIGRAINE) [phone] MG tablet Take 2 tablets by mouth every 8 (eight) hours as needed for headache.     [provider]  Calcium Carbonate-Vit D-Min (CALTRATE 600+D PLUS PO) Take 1 tablet by mouth daily.    [provider]  dicyclomine (BENTYL) 20 MG tablet Take 1 tablet (20 mg total) by mouth 2 (two) times daily. Patient taking differently: Take 20 mg by mouth as needed.  01/20/18   Jacqlyn Larsen, PA-C  estradiol (ESTRACE) 1 MG tablet Take 1 mg by mouth daily.    [provider]  fluticasone (FLONASE) 50 MCG/ACT nasal spray Place 1 spray into both nostrils daily.     [provider]  folic acid (FOLVITE) 1 MG tablet Take 2 mg by mouth daily.    [provider]  ibuprofen (ADVIL,MOTRIN) 200 MG tablet Take 400 mg by mouth every 6 (six) hours as needed for mild pain.     [provider]  levocetirizine (XYZAL) 5 MG  tablet Take 5 mg by mouth every evening.    [provider]  levothyroxine (SYNTHROID, LEVOTHROID) 125 MCG tablet Take 125 mcg by mouth every other day.     [provider]  levothyroxine (SYNTHROID, LEVOTHROID) 137 MCG tablet Take 137 mcg by mouth every other day.    [provider]  methotrexate 50 MG/2ML injection INJECT  0.8ML (20MG TOTAL) SUBCUTANEOUSLY ONE TIME WEEKLY. DISCARD VIAL 28 DAYS AFTER FIRST USE 11/27/19  Bo Merino, MD  metoprolol succinate (TOPROL-XL) 50 MG 24 hr tablet Take 50 mg by mouth daily. Take with or immediately following a meal.    [provider]  montelukast (SINGULAIR) 10 MG tablet Take 10 mg by mouth at bedtime.    [provider]  Multiple Vitamins-Minerals (CENTRUM SILVER ULTRA WOMENS PO) Take 1 tablet by mouth daily.    [provider]  omeprazole (PRILOSEC) 20 MG capsule TAKE 1 CAPSULE EVERY DAY BEFORE BREAKFAST 11/19/19   Nandigam, Venia Minks, MD  ondansetron (ZOFRAN) 4 MG tablet Take 1 tablet (4 mg total) by mouth every 8 (eight) hours as needed for nausea or vomiting. 03/01/17   Mauri Pole, MD  rosuvastatin (CRESTOR) 20 MG tablet Take 20 mg by mouth daily.    [provider]  sertraline (ZOLOFT) 100 MG tablet Take 50 mg by mouth daily.     [provider]  Tiotropium Bromide Monohydrate (SPIRIVA RESPIMAT) 2.5 MCG/ACT AERS Inhale 2 puffs into the lungs daily. 09/24/19   Collene Gobble, MD  Tuberculin-Allergy Syringes 27G X 1/2" 1 ML KIT Inject 1 Syringe into the skin once a week. To be used with weekly methotrexate injections 08/14/16   Bo Merino, MD  VENTOLIN HFA 108 (90 Base) MCG/ACT inhaler Inhale 2 puffs into the lungs every 6 (six) hours as needed for wheezing or shortness of breath. 06/18/18   Collene Gobble, MD  Vitamin D, Ergocalciferol, (DRISDOL) 1.25 MG (50000 UNIT) CAPS capsule Take 1 capsule (50,000 Units total) by mouth every 7 (seven) days. 01/13/20   Danford,  Berna Spare, NP  XELJANZ 5 MG TABS Take 1 tablet by mouth twice a day as directed by physician. 12/03/19   Bo Merino, MD    Family History Family History  Problem Relation Age of Onset  . Breast cancer Mother 73  . Heart disease Mother   . Colon polyps Mother   . Other Mother        pre-cancerous tumor removed  . Colon cancer Mother   . Hypertension Mother   . Hyperlipidemia Mother   . Cancer Mother   . Depression Mother   . Heart disease Father   . Hypertension Father   . Hyperlipidemia Father   . Stroke Father   . Alcohol abuse Father   . Crohn's disease Sister   . Alcoholism Sister   . Irritable bowel syndrome Other   . Crohn's disease Daughter   . Breast cancer Paternal Aunt   . Breast cancer Maternal Grandmother   . Esophageal cancer Neg Hx   . Rectal cancer Neg Hx   . Stomach cancer Neg Hx   . Pancreatic cancer Neg Hx     Social History Social History   Tobacco Use  . Smoking status: Former Smoker    Packs/day: 1.00    Years: 28.00    Pack years: 28.00    Types: Cigarettes    Quit date: 07/27/2005    Years since quitting: 14.4  . Smokeless tobacco: Never Used  Vaping Use  . Vaping Use: Never used  Substance Use Topics  . Alcohol use: Yes    Alcohol/week: 0.0 standard drinks    Comment:  occasionally (1x/mo)  . Drug use: No     Allergies   Demerol [meperidine], Imuran [azathioprine], Meperidine hcl, Remicade [infliximab], Sulfasalazine, Avelox [moxifloxacin], Doxycycline, Erythromycin, and Latex   Review of Systems Review of Systems  Constitutional: Negative for chills and fever.  HENT: Negative for congestion, ear  pain, sinus pressure, sore throat and trouble swallowing.        Mass on neck at base of scalp.   Eyes: Negative for pain and visual disturbance.  Respiratory: Negative for cough and shortness of breath.   Cardiovascular: Negative for chest pain and palpitations.  Gastrointestinal: Negative for abdominal pain, diarrhea, nausea and  vomiting.  Genitourinary: Negative for dysuria and hematuria.  Musculoskeletal: Negative for arthralgias, back pain and neck pain.  Skin: Negative for color change and rash.  Neurological: Negative for seizures, syncope, weakness and numbness.  All other systems reviewed and are negative.    Physical Exam Triage Vital Signs ED Triage Vitals [01/21/20 1331]  Enc Vitals Group     BP      Pulse      Resp      Temp      Temp src      SpO2      Weight      Height      Head Circumference      Peak Flow      Pain Score 0     Pain Loc      Pain Edu?      Excl. in [place]?    No data found.  Updated Vital Signs BP 125/77   Pulse 77   Temp 98.1 F (36.7 C)   Resp 12   SpO2 97%   Visual Acuity Right Eye Distance:   Left Eye Distance:   Bilateral Distance:    Right Eye Near:   Left Eye Near:    Bilateral Near:     Physical Exam Vitals and nursing note reviewed.  Constitutional:      General: She is not in acute distress.    Appearance: She is well-developed. She is not ill-appearing.  HENT:     Head: Normocephalic and atraumatic.     Right Ear: Tympanic membrane normal.     Left Ear: Tympanic membrane normal.     Nose: Nose normal.     Mouth/Throat:     Mouth: Mucous membranes are moist.     Pharynx: Oropharynx is clear.  Eyes:     Conjunctiva/sclera: Conjunctivae normal.  Neck:     Comments: Nontender 0.5 cm mass behind right ear on scalp.   Cardiovascular:     Rate and Rhythm: Normal rate and regular rhythm.     Heart sounds: No murmur heard.   Pulmonary:     Effort: Pulmonary effort is normal. No respiratory distress.     Breath sounds: Normal breath sounds.  Abdominal:     Palpations: Abdomen is soft.     Tenderness: There is no abdominal tenderness. There is no guarding or rebound.  Musculoskeletal:     Cervical back: Neck supple.  Skin:    General: Skin is warm and dry.     Findings: No bruising, erythema, lesion or rash.  Neurological:      General: No focal deficit present.     Mental Status: She is alert and oriented to person, place, and time.     Gait: Gait normal.  Psychiatric:        Mood and Affect: Mood normal.        Behavior: Behavior normal.      UC Treatments / Results  Labs (all labs ordered are listed, but only abnormal results are displayed) Labs Reviewed - No data to display  EKG   Radiology No results found.  Procedures Procedures (including critical care time)  Medications Ordered in UC Medications - No data to display  Initial Impression / Assessment and Plan / UC Course  I have reviewed the triage vital signs and the nursing notes.  Pertinent labs & imaging results that were available during my care of the patient were reviewed by me and considered in my medical decision making (see chart for details).   Lymphadenopathy.  Instructed patient to follow-up with her PCP in 2 weeks for a recheck.  Instructed patient that she may require diagnostic testing or surgical consult if the area is not resolving.  Patient agrees to plan of care.       Final Clinical Impressions(s) / UC Diagnoses   Final diagnoses:  Lymphadenopathy     Discharge Instructions     Follow-up with your primary care provider in 2 weeks for a recheck.      ED Prescriptions    None     PDMP not reviewed this encounter.   Sharion Balloon, NP 01/21/20 1402

## 2020-01-21 NOTE — Telephone Encounter (Signed)
Patient advised per Hazel Sams, PA-C as she is unable to get an appointment with her PCP, she should be evaluated at urgent care. Patient verbalized understanding.

## 2020-01-21 NOTE — Discharge Instructions (Addendum)
Follow-up with your primary care provider in 2 weeks for a recheck.

## 2020-01-26 ENCOUNTER — Telehealth: Payer: Self-pay | Admitting: *Deleted

## 2020-01-26 NOTE — Telephone Encounter (Signed)
Dr Silverio Decamp,  [facility] saw this pt 07-30-2019 for a follow up of UC- in the note you stated change recall to 5--2022-, 3 yrs from last colon 2019  She is on the schedule for a colon now, 02-23-2020.  Do you want to proceed or change recall to 11-2020?  Please advise and thanks for your time, Lelan Pons

## 2020-01-26 NOTE — Telephone Encounter (Signed)
Based on current guidelines the recommendation for surveillance recall colonoscopy for patients with history of IBD is 1 to 3 years. Given she was asymptomatic and overall doing well, we switched the recall to 3 years, 11/2020 but if patient wants to proceed with colonoscopy next month, I am fine with that.  Thank you

## 2020-02-09 ENCOUNTER — Ambulatory Visit (AMBULATORY_SURGERY_CENTER): Payer: Self-pay | Admitting: *Deleted

## 2020-02-09 ENCOUNTER — Other Ambulatory Visit: Payer: Self-pay

## 2020-02-09 VITALS — Ht 66.0 in | Wt 219.0 lb

## 2020-02-09 DIAGNOSIS — K5100 Ulcerative (chronic) pancolitis without complications: Secondary | ICD-10-CM

## 2020-02-09 MED ORDER — SUTAB 1479-225-188 MG PO TABS: 24.0000 | ORAL_TABLET | ORAL | 0 refills | Status: DC

## 2020-02-09 NOTE — Progress Notes (Signed)
No egg or soy allergy known to patient   issues with past sedation with any surgeries or procedures of PONV  no intubation problems in the past  No diet pills per patient No home 02 use per patient  No blood thinners per patient  Pt denies issues with constipation  No A fib or A flutter  EMMI video to pt or MyChart  COVID 19 guidelines implemented in [facility] today   10-20-19 cov vacc x 2   [employer] C[employer]on given to pt in [facility] today   Due to the COVID-19 pandemic we are asking patients to follow these guidelines. Please only bring one care partner. Please be aware that your care partner may wait in the car in the parking lot or if they feel like they will be too hot to wait in the car, they may wait in the lobby on the 4th floor. All care partners are required to wear a mask the entire time (we do not have any that we can provide them), they need to practice social distancing, and we will do a Covid check for all patient's and care partners when you arrive. Also we will check their temperature and your temperature. If the care partner waits in their car they need to stay in the parking lot the entire time and we will call them on their cell phone when the patient is ready for discharge so they can bring the car to the front of the building. Also all patient's will need to wear a mask into building.

## 2020-02-10 ENCOUNTER — Encounter (INDEPENDENT_AMBULATORY_CARE_PROVIDER_SITE_OTHER): Payer: Self-pay | Admitting: Physician Assistant

## 2020-02-10 ENCOUNTER — Telehealth: Payer: Self-pay | Admitting: Emergency Medicine

## 2020-02-10 ENCOUNTER — Ambulatory Visit (INDEPENDENT_AMBULATORY_CARE_PROVIDER_SITE_OTHER): Payer: Medicare HMO | Admitting: Physician Assistant

## 2020-02-10 ENCOUNTER — Other Ambulatory Visit: Payer: Self-pay

## 2020-02-10 ENCOUNTER — Encounter: Payer: Self-pay | Admitting: Gastroenterology

## 2020-02-10 VITALS — BP 120/77 | HR 74 | Temp 97.7°F | Ht 66.0 in | Wt 213.0 lb

## 2020-02-10 DIAGNOSIS — E559 Vitamin D deficiency, unspecified: Secondary | ICD-10-CM | POA: Diagnosis not present

## 2020-02-10 DIAGNOSIS — J449 Chronic obstructive pulmonary disease, unspecified: Secondary | ICD-10-CM

## 2020-02-10 DIAGNOSIS — E7849 Other hyperlipidemia: Secondary | ICD-10-CM

## 2020-02-10 DIAGNOSIS — E669 Obesity, unspecified: Secondary | ICD-10-CM

## 2020-02-10 DIAGNOSIS — Z6834 Body mass index (BMI) 34.0-34.9, adult: Secondary | ICD-10-CM

## 2020-02-10 MED ORDER — VITAMIN D (ERGOCALCIFEROL) 1.25 MG (50000 UNIT) PO CAPS: 50000.0000 [IU] | ORAL_CAPSULE | ORAL | 0 refills | Status: DC

## 2020-02-10 NOTE — Progress Notes (Signed)
Chief Complaint:   OBESITY [name] is here to discuss her progress with her obesity treatment plan along with follow-up of her obesity related diagnoses. [name] is on the Category 4 Plan and states she is following her eating plan approximately 90% of the time. [name] states she is walking 30 minutes 3 times per week.  Today's visit was #: 7 Starting weight: 224 lbs Starting date: 10/15/2019 Today's weight: 213 lbs Today's date: 02/10/2020 Total lbs lost to date: 11 Total lbs lost since last in-office visit: 0  Interim History: [name] just returned from [place] where she was mindful of her food choices. She is ready to get back on plan.  Subjective:   Vitamin D deficiency. [name] is on prescription Vitamin D, which she is tolerating well.   Ref. Range 10/27/2019 15:12  Vitamin D, 25-Hydroxy Latest Ref Range: 30.0 - 100.0 ng/mL 22.5 (L)   Other hyperlipidemia. [name] is on Crestor. No chest pain or myalgias.   Lab Results  Component Value Date   CHOL 153 05/07/2018   HDL 61 05/07/2018   LDLCALC 75 05/07/2018   TRIG 90 05/07/2018   CHOLHDL 2.5 05/07/2018   Lab Results  Component Value Date   ALT 19 10/27/2019   AST 22 10/27/2019   ALKPHOS 61 10/27/2019   BILITOT 0.3 10/27/2019   The 10-year ASCVD risk score Mikey Bussing DC Jr., et al., 2013) is: 1.4%   Values used to calculate the score:     Age: 54 years     Sex: Female     Is Non-Hispanic African American: No     Diabetic: No     Tobacco smoker: No     Systolic Blood Pressure: 017 mmHg     Is BP treated: Yes     HDL Cholesterol: 61 mg/dL     Total Cholesterol: 153 mg/dL  Assessment/Plan:   Vitamin D deficiency. Low Vitamin D level contributes to fatigue and are associated with obesity, breast, and colon cancer. She was given a refill on her Vitamin D, Ergocalciferol, (DRISDOL) 1.25 MG (50000 UNIT) CAPS capsule every week #4 with 0 refills and will follow-up for routine testing of Vitamin D, at least 2-3  times per year to avoid over-replacement.   Other hyperlipidemia. Cardiovascular risk and specific lipid/LDL goals reviewed.  We discussed several lifestyle modifications today and Bridey will continue to work on diet, exercise and weight loss efforts. Orders and follow up as documented in patient record. She will continue her medication as directed.   Counseling Intensive lifestyle modifications are the first line treatment for this issue. . Dietary changes: Increase soluble fiber. Decrease simple carbohydrates. . Exercise changes: Moderate to vigorous-intensity aerobic activity 150 minutes per week if tolerated. . Lipid-lowering medications: see documented in medical record.  Class 1 obesity with serious comorbidity and body mass index (BMI) of 34.0 to 34.9 in adult, unspecified obesity type.  [name] is currently in the action stage of change. As such, her goal is to continue with weight loss efforts. She has agreed to the Category 4 Plan.   Exercise goals: For substantial health benefits, adults should do at least 150 minutes (2 hours and 30 minutes) a week of moderate-intensity, or 75 minutes (1 hour and 15 minutes) a week of vigorous-intensity aerobic physical activity, or an equivalent combination of moderate- and vigorous-intensity aerobic activity. Aerobic activity should be performed in episodes of at least 10 minutes, and preferably, it should be spread throughout the week.  Behavioral  modification strategies: meal planning and cooking strategies and keeping healthy foods in the home.  [name] has agreed to follow-up with our clinic in 2-3 weeks. She was informed of the importance of frequent follow-up visits to maximize her success with intensive lifestyle modifications for her multiple health conditions.   Objective:   Blood pressure 120/77, pulse 74, temperature 97.7 F (36.5 C), temperature source Oral, height 5' 6"  (1.676 m), weight (!) 213 lb (96.6 kg), SpO2 97 %. Body mass  index is 34.38 kg/m.  General: Cooperative, alert, well developed, in no acute distress. HEENT: Conjunctivae and lids unremarkable. Cardiovascular: Regular rhythm.  Lungs: Normal work of breathing. Neurologic: No focal deficits.   Lab Results  Component Value Date   CREATININE 0.90 10/27/2019   BUN 21 10/27/2019   NA 143 10/27/2019   K 4.4 10/27/2019   CL 102 10/27/2019   CO2 20 10/27/2019   Lab Results  Component Value Date   ALT 19 10/27/2019   AST 22 10/27/2019   ALKPHOS 61 10/27/2019   BILITOT 0.3 10/27/2019   Lab Results  Component Value Date   HGBA1C 5.8 (H) 10/15/2019   Lab Results  Component Value Date   INSULIN 20.8 10/27/2019   INSULIN CANCELED 10/15/2019   Lab Results  Component Value Date   TSH 1.940 10/27/2019   Lab Results  Component Value Date   CHOL 153 05/07/2018   HDL 61 05/07/2018   LDLCALC 75 05/07/2018   TRIG 90 05/07/2018   CHOLHDL 2.5 05/07/2018   Lab Results  Component Value Date   WBC 6.6 11/03/2019   HGB 12.6 11/03/2019   HCT 38.4 11/03/2019   MCV 97.7 11/03/2019   PLT 315 11/03/2019   Lab Results  Component Value Date   IRON 85 05/31/2015   FERRITIN 55.2 03/28/2018   Obesity Behavioral Intervention Documentation for Insurance:   Approximately 15 minutes were spent on the discussion below.  ASK: We discussed the diagnosis of obesity with [name] today and Keyasha agreed to give [place] permission to discuss obesity behavioral modification therapy today.  ASSESS: Esteen has the diagnosis of obesity and her BMI today is 34.5. Johnelle is in the action stage of change.   ADVISE: Arielis was educated on the multiple health risks of obesity as well as the benefit of weight loss to improve her health. She was advised of the need for long term treatment and the importance of lifestyle modifications to improve her current health and to decrease her risk of future health problems.  AGREE: Multiple dietary modification options and  treatment options were discussed and Kyelle agreed to follow the recommendations documented in the above note.  ARRANGE: [name] was educated on the importance of frequent visits to treat obesity as outlined per CMS and USPSTF guidelines and agreed to schedule her next follow up appointment today.  Attestation Statements:   Reviewed by clinician on day of visit: allergies, medications, problem list, medical history, surgical history, family history, social history, and previous encounter notes.  IMichaelene Song, am acting as transcriptionist for Abby Potash, PA-C   I have reviewed the above documentation for accuracy and completeness, and I agree with the above. Abby Potash, PA-C

## 2020-02-10 NOTE — Telephone Encounter (Signed)
I am ok with a trial change from Spiriva to wixella.  Also agree with referral to lung cancer screening program.

## 2020-02-10 NOTE — Telephone Encounter (Signed)
Spoke with the pt  She states that she needs appt for lung ca screening that RB mentioned at her appt in Jan  Order placed  I have scheduled her f/u for Sept with RB  She asks if we can switch her spiriva to generic advair since this would be less expensive  Please advise, thanks

## 2020-02-10 NOTE — Telephone Encounter (Signed)
Pt called back about this, please return call.

## 2020-02-10 NOTE — Telephone Encounter (Signed)
Tried calling the pt and there was no answer- LMTCB x 1

## 2020-02-10 NOTE — Telephone Encounter (Signed)
OK, thanks please advise on strength of wixella

## 2020-02-11 ENCOUNTER — Other Ambulatory Visit: Payer: Self-pay | Admitting: Physician Assistant

## 2020-02-11 MED ORDER — FLUTICASONE-SALMETEROL 250-50 MCG/DOSE IN AEPB: 1.0000 | INHALATION_SPRAY | Freq: Two times a day (BID) | RESPIRATORY_TRACT | 5 refills | Status: DC

## 2020-02-11 NOTE — Telephone Encounter (Signed)
Wixella 250/50 twice a day

## 2020-02-11 NOTE — Telephone Encounter (Signed)
Called and spoke with pt letting her know the info stated by RB and she verbalized understanding. Rx for [name] has been sent to pharmacy for pt. Nothing further needed.

## 2020-02-11 NOTE — Telephone Encounter (Signed)
Last Visit: 11/03/2019 Next Visit: 04/05/2020 Labs: 10/27/2019 CMP WNL, 11/03/2019 CBC WNL   Current Dose per office note on 11/03/2019: MTX 0.8 ml sq once weekly Dx: Psoriatic arthritis   Patient advised she due to update labs. Patient she will get them done when she gets back into town next week.   Okay to refill 30 day supply MTX?

## 2020-02-17 ENCOUNTER — Telehealth: Payer: Self-pay | Admitting: Rheumatology

## 2020-02-17 ENCOUNTER — Other Ambulatory Visit: Payer: Self-pay | Admitting: *Deleted

## 2020-02-17 DIAGNOSIS — Z79899 Other long term (current) drug therapy: Secondary | ICD-10-CM

## 2020-02-17 NOTE — Telephone Encounter (Signed)
Patient going to [name] for labs. Please release orders.

## 2020-02-17 NOTE — Telephone Encounter (Signed)
Lab Orders released.  

## 2020-02-18 LAB — CMP14+EGFR
ALT: 13 IU/L (ref 0–32)
AST: 19 IU/L (ref 0–40)
Albumin/Globulin Ratio: 1.8 (ref 1.2–2.2)
Albumin: 4.1 g/dL (ref 3.8–4.9)
Alkaline Phosphatase: 61 IU/L (ref 48–121)
BUN/Creatinine Ratio: 17 (ref 9–23)
BUN: 15 mg/dL (ref 6–24)
Bilirubin Total: 0.2 mg/dL (ref 0.0–1.2)
CO2: 27 mmol/L (ref 20–29)
Calcium: 9.3 mg/dL (ref 8.7–10.2)
Chloride: 101 mmol/L (ref 96–106)
Creatinine, Ser: 0.86 mg/dL (ref 0.57–1.00)
GFR calc Af Amer: 89 mL/min/{1.73_m2} (ref >59)
GFR calc non Af Amer: 77 mL/min/{1.73_m2} (ref >59)
Globulin, Total: 2.3 g/dL (ref 1.5–4.5)
Glucose: 121 mg/dL — ABNORMAL HIGH (ref 65–99)
Potassium: 4.6 mmol/L (ref 3.5–5.2)
Sodium: 141 mmol/L (ref 134–144)
Total Protein: 6.4 g/dL (ref 6.0–8.5)

## 2020-02-18 LAB — CBC WITH DIFFERENTIAL/PLATELET
Basophils Absolute: 0.1 10*3/uL (ref 0.0–0.2)
Basos: 1 %
EOS (ABSOLUTE): 0.1 10*3/uL (ref 0.0–0.4)
Eos: 1 %
Hematocrit: 34.2 % (ref 34.0–46.6)
Hemoglobin: 11.9 g/dL (ref 11.1–15.9)
Immature Grans (Abs): 0 10*3/uL (ref 0.0–0.1)
Immature Granulocytes: 0 %
Lymphocytes Absolute: 2.2 10*3/uL (ref 0.7–3.1)
Lymphs: 31 %
MCH: 33 pg (ref 26.6–33.0)
MCHC: 34.8 g/dL (ref 31.5–35.7)
MCV: 95 fL (ref 79–97)
Monocytes Absolute: 0.5 10*3/uL (ref 0.1–0.9)
Monocytes: 7 %
Neutrophils Absolute: 4.2 10*3/uL (ref 1.4–7.0)
Neutrophils: 60 %
Platelets: 244 10*3/uL (ref 150–450)
RBC: 3.61 x10E6/uL — ABNORMAL LOW (ref 3.77–5.28)
RDW: 12.5 % (ref 11.7–15.4)
WBC: 7.1 10*3/uL (ref 3.4–10.8)

## 2020-02-18 NOTE — Progress Notes (Signed)
Glucose is 121.  Rest of CMP WNL.  RBC count is borderline low.  Hgb and hct are WNL.  Rest of CBC WNL>

## 2020-02-19 ENCOUNTER — Other Ambulatory Visit: Payer: Self-pay | Admitting: *Deleted

## 2020-02-19 DIAGNOSIS — Z87891 Personal history of nicotine dependence: Secondary | ICD-10-CM

## 2020-02-22 ENCOUNTER — Encounter: Payer: Self-pay | Admitting: Certified Registered Nurse Anesthetist

## 2020-02-23 ENCOUNTER — Other Ambulatory Visit: Payer: Self-pay

## 2020-02-23 ENCOUNTER — Ambulatory Visit (AMBULATORY_SURGERY_CENTER): Payer: Medicare HMO | Admitting: Gastroenterology

## 2020-02-23 ENCOUNTER — Encounter: Payer: Self-pay | Admitting: Gastroenterology

## 2020-02-23 VITALS — BP 138/62 | HR 61 | Temp 96.0°F | Resp 14 | Ht 67.2 in | Wt 219.0 lb

## 2020-02-23 DIAGNOSIS — I10 Essential (primary) hypertension: Secondary | ICD-10-CM | POA: Diagnosis not present

## 2020-02-23 DIAGNOSIS — K5100 Ulcerative (chronic) pancolitis without complications: Secondary | ICD-10-CM

## 2020-02-23 DIAGNOSIS — Z800 Family history of malignant neoplasm of digestive organs: Secondary | ICD-10-CM | POA: Diagnosis not present

## 2020-02-23 DIAGNOSIS — D123 Benign neoplasm of transverse colon: Secondary | ICD-10-CM

## 2020-02-23 DIAGNOSIS — Z86010 Personal history of colonic polyps: Secondary | ICD-10-CM | POA: Diagnosis not present

## 2020-02-23 DIAGNOSIS — K621 Rectal polyp: Secondary | ICD-10-CM

## 2020-02-23 DIAGNOSIS — J449 Chronic obstructive pulmonary disease, unspecified: Secondary | ICD-10-CM | POA: Diagnosis not present

## 2020-02-23 DIAGNOSIS — K5190 Ulcerative colitis, unspecified, without complications: Secondary | ICD-10-CM | POA: Diagnosis not present

## 2020-02-23 MED ORDER — SODIUM CHLORIDE 0.9 % IV SOLN: 500.0000 mL | Freq: Once | INTRAVENOUS | Status: DC

## 2020-02-23 NOTE — Patient Instructions (Signed)
YOU HAD AN ENDOSCOPIC PROCEDURE TODAY AT [facility]:   Refer to the procedure report that was given to you for any specific questions about what was found during the examination.  If the procedure report does not answer your questions, please call your gastroenterologist to clarify.  If you requested that your care partner not be given the details of your procedure findings, then the procedure report has been included in a sealed envelope for you to review at your convenience later.  YOU SHOULD EXPECT: Some feelings of bloating in the abdomen. Passage of more gas than usual.  Walking can help get rid of the air that was put into your GI tract during the procedure and reduce the bloating. If you had a lower endoscopy (such as a colonoscopy or flexible sigmoidoscopy) you may notice spotting of blood in your stool or on the toilet paper. If you underwent a bowel prep for your procedure, you may not have a normal bowel movement for a few days.  **Handouts given on Polyps and Hemorrhoids**  Please Note:  You might notice some irritation and congestion in your nose or some drainage.  This is from the oxygen used during your procedure.  There is no need for concern and it should clear up in a day or so.  SYMPTOMS TO REPORT IMMEDIATELY:   Following lower endoscopy (colonoscopy or flexible sigmoidoscopy):  Excessive amounts of blood in the stool  Significant tenderness or worsening of abdominal pains  Swelling of the abdomen that is new, acute  Fever of 100F or higher  For urgent or emergent issues, a gastroenterologist can be reached at any hour by calling [phone]. Do not use MyChart messaging for urgent concerns.    DIET:  We do recommend a small meal at first, but then you may proceed to your regular diet.  Drink plenty of fluids but you should avoid alcoholic beverages for 24 hours.  ACTIVITY:  You should plan to take it easy for the rest of today and you should NOT DRIVE  or use heavy machinery until tomorrow (because of the sedation medicines used during the test).    FOLLOW UP: Our staff will call the number listed on your records 48-72 hours following your procedure to check on you and address any questions or concerns that you may have regarding the information given to you following your procedure. If we do not reach you, we will leave a message.  We will attempt to reach you two times.  During this call, we will ask if you have developed any symptoms of COVID 19. If you develop any symptoms (ie: fever, flu-like symptoms, shortness of breath, cough etc.) before then, please call [phone].  If you test positive for Covid 19 in the 2 weeks post procedure, please call and report this information to [place].    If any biopsies were taken you will be contacted by phone or by letter within the next 1-3 weeks.  Please call us at [phone] if you have not heard about the biopsies in 3 weeks.    SIGNATURES/CONFIDENTIALITY: You and/or your care partner have signed paperwork which will be entered into your electronic medical record.  These signatures attest to the fact that that the information above on your After Visit Summary has been reviewed and is understood.  Full responsibility of the confidentiality of this discharge information lies with you and/or your care-partner.

## 2020-02-23 NOTE — Op Note (Addendum)
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 The patient's tolerance of                            previous anesthesia was also reviewed. The risks                            and benefits of the procedure and the sedation                            options and risks were discussed with the patient.                            All questions were answered, and informed consent                            was obtained. Prior Anticoagulants: The patient has                            taken no previous anticoagulant or antiplatelet                            agents. ASA Grade Assessment: III - A patient with                            severe systemic disease. After reviewing the risks  and benefits, the patient was deemed in                            satisfactory condition to undergo the procedure.                           After obtaining informed consent, the colonoscope                            was passed under direct vision. Throughout the                            procedure, the patient's blood pressure, pulse, and                            oxygen  saturations were monitored continuously. The                            Colonoscope was introduced through the anus and                            advanced to the the cecum, identified by                            appendiceal orifice and ileocecal valve. The                            colonoscopy was performed without difficulty. The                            patient tolerated the procedure well. The quality                            of the bowel preparation was good. The terminal                            ileum, ileocecal valve, appendiceal orifice, and                            rectum were photographed. Scope In: 9:48:31 AM Scope Out: 10:00:49 AM Scope Withdrawal Time: 0 hours 10 minutes 44 seconds  Total Procedure Duration: 0 hours 12 minutes 18 seconds  Findings:                 The perianal and digital rectal examinations were                            normal.                           A 5 mm polyp was found in the transverse colon. The                            polyp was sessile. The polyp was removed with a  cold snare. Resection and retrieval were complete.                           A localized area of mildly nodular mucosa was found                            in the rectum. Biopsies were taken with a cold                            forceps for histology.                           Non-bleeding internal hemorrhoids were found during                            retroflexion. The hemorrhoids were medium-sized.                           The exam was otherwise without abnormality. Complications:            No immediate complications. Estimated Blood Loss:     Estimated blood loss was minimal. Impression:               - One 5 mm polyp in the transverse colon, removed                            with a cold snare. Resected and retrieved.                           - Nodular mucosa in the rectum. Biopsied.                           - Non-bleeding internal  hemorrhoids.                           - The examination was otherwise normal. Recommendation:           - Patient has a contact number available for                            emergencies. The signs and symptoms of potential                            delayed complications were discussed with the                            patient. Return to normal activities tomorrow.                            Written discharge instructions were provided to the                            patient.                           - Resume previous diet.                           -  Continue present medications.                           - Await pathology results.                           - Repeat colonoscopy in 3 years for surveillance                            based on pathology results. Mauri Pole, MD 02/23/2020 10:06:40 AM This report has been signed electronically.

## 2020-02-23 NOTE — Progress Notes (Signed)
Report given to PACU, vss 

## 2020-02-23 NOTE — Progress Notes (Signed)
Pt's states no medical or surgical changes since previsit or office visit.  Check in LB Vs-CW/TB

## 2020-02-23 NOTE — Progress Notes (Signed)
Called to room to assist during endoscopic procedure.  Patient ID and intended procedure confirmed with present staff. Received instructions for my participation in the procedure from the performing physician.  

## 2020-02-24 ENCOUNTER — Other Ambulatory Visit: Payer: Self-pay

## 2020-02-24 ENCOUNTER — Encounter (INDEPENDENT_AMBULATORY_CARE_PROVIDER_SITE_OTHER): Payer: Self-pay | Admitting: Family Medicine

## 2020-02-24 ENCOUNTER — Ambulatory Visit (INDEPENDENT_AMBULATORY_CARE_PROVIDER_SITE_OTHER): Payer: Medicare HMO | Admitting: Family Medicine

## 2020-02-24 VITALS — BP 110/72 | HR 62 | Temp 98.0°F | Ht 66.0 in | Wt 212.0 lb

## 2020-02-24 DIAGNOSIS — E559 Vitamin D deficiency, unspecified: Secondary | ICD-10-CM

## 2020-02-24 DIAGNOSIS — R7303 Prediabetes: Secondary | ICD-10-CM

## 2020-02-24 DIAGNOSIS — Z6834 Body mass index (BMI) 34.0-34.9, adult: Secondary | ICD-10-CM

## 2020-02-24 DIAGNOSIS — E669 Obesity, unspecified: Secondary | ICD-10-CM | POA: Diagnosis not present

## 2020-02-24 MED ORDER — VITAMIN D (ERGOCALCIFEROL) 1.25 MG (50000 UNIT) PO CAPS: 50000.0000 [IU] | ORAL_CAPSULE | ORAL | 0 refills | Status: DC

## 2020-02-25 ENCOUNTER — Telehealth: Payer: Self-pay | Admitting: *Deleted

## 2020-02-25 NOTE — Telephone Encounter (Signed)
  Follow up Call-  Call back number 02/23/2020 11/14/2017  Post procedure Call Back phone  # [phone]  Permission to leave phone message Yes Yes  Some recent data might be hidden     Patient questions:  Do you have a fever, pain , or abdominal swelling? No. Pain Score  0 *  Have you tolerated food without any problems? Yes.    Have you been able to return to your normal activities? Yes.    Do you have any questions about your discharge instructions: Diet   No. Medications  No. Follow up visit  No.  Do you have questions or concerns about your Care? No.  Actions: * If pain score is 4 or above: No action needed, pain <4  1. Have you developed a fever since your procedure? NO  2.   Have you had an respiratory symptoms (SOB or cough) since your procedure? NO  3.   Have you tested positive for COVID 19 since your procedure NO  4.   Have you had any family members/close contacts diagnosed with the COVID 19 since your procedure?  NO   If yes to any of these questions please route to Joylene John, RN and Joella Prince, RN

## 2020-02-26 NOTE — Progress Notes (Signed)
Chief Complaint:   OBESITY [name] is here to discuss her progress with her obesity treatment plan along with follow-up of her obesity related diagnoses. [name] is on the Category 4 Plan and states she is following her eating plan approximately 99% of the time. [name] states she is active while walking.  Today's visit was #: 8 Starting weight: 224 lbs Starting date: 10/15/2019 Today's weight: 212 lbs Today's date: 02/25/2020 Total lbs lost to date: 12 Total lbs lost since last in-office visit: 1  Interim History: [name] is following the plan almost completely. Breakfast she is doing egg option, lunch is a sandwich or microwave meal, and dinner is closer to 8 oz of protein and vegetables. Snacks are carrots, pretzel crisps, snack bars, and ice cream bars. She denies cravings.  Subjective:   1. Pre-diabetes [name]'s last A1c was 5.8 and insulin 20.8. She is not on medications.  2. Vitamin D deficiency [name] denies nausea, vomiting, or muscle weakness, but she notes fatigue. She is on prescription Vit D.  Assessment/Plan:   1. Pre-diabetes [name] will continue to work on weight loss, exercise, and decreasing simple carbohydrates to help decrease the risk of diabetes. We will repeat labs at her next appointment.  2. Vitamin D deficiency Low Vitamin D level contributes to fatigue and are associated with obesity, breast, and colon cancer. We will refill prescription Vitamin D for 1 month. [name] will follow-up for routine testing of Vitamin D, at least 2-3 times per year to avoid over-replacement.  - Vitamin D, Ergocalciferol, (DRISDOL) 1.25 MG (50000 UNIT) CAPS capsule; Take 1 capsule (50,000 Units total) by mouth every 7 (seven) days.  Dispense: 4 capsule; Refill: 0  3. Class 1 obesity with serious comorbidity and body mass index (BMI) of 34.0 to 34.9 in adult, unspecified obesity type [name] is currently in the action stage of change. As such, her goal is to continue with weight  loss efforts. She has agreed to the Category 4 Plan with 6-8 oz of milk.   We will repeat IC at her next appointment.  Exercise goals: All adults should avoid inactivity. Some physical activity is better than none, and adults who participate in any amount of physical activity gain some health benefits.  Behavioral modification strategies: increasing lean protein intake, meal planning and cooking strategies, keeping healthy foods in the home and planning for success.  [name] has agreed to follow-up with our clinic in 3 weeks. She was informed of the importance of frequent follow-up visits to maximize her success with intensive lifestyle modifications for her multiple health conditions.   Objective:   Blood pressure 110/72, pulse 62, temperature 98 F (36.7 C), temperature source Oral, height 5' 6"  (1.676 m), weight 212 lb (96.2 kg), SpO2 99 %. Body mass index is 34.22 kg/m.  General: Cooperative, alert, well developed, in no acute distress. HEENT: Conjunctivae and lids unremarkable. Cardiovascular: Regular rhythm.  Lungs: Normal work of breathing. Neurologic: No focal deficits.   Lab Results  Component Value Date   CREATININE 0.86 02/17/2020   BUN 15 02/17/2020   NA 141 02/17/2020   K 4.6 02/17/2020   CL 101 02/17/2020   CO2 27 02/17/2020   Lab Results  Component Value Date   ALT 13 02/17/2020   AST 19 02/17/2020   ALKPHOS 61 02/17/2020   BILITOT 0.2 02/17/2020   Lab Results  Component Value Date   HGBA1C 5.8 (H) 10/15/2019   Lab Results  Component Value Date   INSULIN 20.8  10/27/2019   INSULIN CANCELED 10/15/2019   Lab Results  Component Value Date   TSH 1.940 10/27/2019   Lab Results  Component Value Date   CHOL 153 05/07/2018   HDL 61 05/07/2018   LDLCALC 75 05/07/2018   TRIG 90 05/07/2018   CHOLHDL 2.5 05/07/2018   Lab Results  Component Value Date   WBC 7.1 02/17/2020   HGB 11.9 02/17/2020   HCT 34.2 02/17/2020   MCV 95 02/17/2020   PLT 244  02/17/2020   Lab Results  Component Value Date   IRON 85 05/31/2015   FERRITIN 55.2 03/28/2018    Obesity Behavioral Intervention Documentation for Insurance:   Approximately 15 minutes were spent on the discussion below.  ASK: We discussed the diagnosis of obesity with [name] today and [name] agreed to give [place] permission to discuss obesity behavioral modification therapy today.  ASSESS: [name] has the diagnosis of obesity and her BMI today is 34.23. [name] is in the action stage of change.   ADVISE: [name] was educated on the multiple health risks of obesity as well as the benefit of weight loss to improve her health. She was advised of the need for long term treatment and the importance of lifestyle modifications to improve her current health and to decrease her risk of future health problems.  AGREE: Multiple dietary modification options and treatment options were discussed and [name] agreed to follow the recommendations documented in the above note.  ARRANGE: [name] was educated on the importance of frequent visits to treat obesity as outlined per CMS and USPSTF guidelines and agreed to schedule her next follow up appointment today.  Attestation Statements:   Reviewed by clinician on day of visit: allergies, medications, problem list, medical history, surgical history, family history, social history, and previous encounter notes.   I, Trixie Dredge, am acting as transcriptionist for Coralie Common, MD.  I have reviewed the above documentation for accuracy and completeness, and I agree with the above. - Jinny Blossom, MD

## 2020-03-01 ENCOUNTER — Other Ambulatory Visit (INDEPENDENT_AMBULATORY_CARE_PROVIDER_SITE_OTHER): Payer: Self-pay | Admitting: Adult Health

## 2020-03-01 ENCOUNTER — Other Ambulatory Visit: Payer: Self-pay | Admitting: Rheumatology

## 2020-03-01 DIAGNOSIS — E559 Vitamin D deficiency, unspecified: Secondary | ICD-10-CM

## 2020-03-02 NOTE — Telephone Encounter (Signed)
TB gold negative on 11/03/19.  Ok to refill Morrie Sheldon.

## 2020-03-02 NOTE — Telephone Encounter (Signed)
Last Visit:11/03/2019 Next Visit:04/05/2020 Labs:02/17/2020 Glucose is 121. Rest of CMP WNL. RBC count is borderline low. Hgb and hct are WNL. Rest of CBC WNL  Current Dose per office note on 11/03/2019: [name] 5 mg BID  Okay to refill [name]?

## 2020-03-03 ENCOUNTER — Other Ambulatory Visit: Payer: Self-pay | Admitting: Physician Assistant

## 2020-03-03 NOTE — Telephone Encounter (Signed)
Last Visit:11/03/2019 Next Visit:04/05/2020 Labs: 02/17/2020 Glucose is 121. Rest of CMP WNL. RBC count is borderline low. Hgb and hct are WNL. Rest of CBC WNL  Current Dose per office note on 11/03/2019: MTX 0.8 ml sq once weekly Dx: Psoriatic arthritis   Okay to refill MTX?

## 2020-03-05 ENCOUNTER — Encounter: Payer: Self-pay | Admitting: Gastroenterology

## 2020-03-16 ENCOUNTER — Encounter (INDEPENDENT_AMBULATORY_CARE_PROVIDER_SITE_OTHER): Payer: Self-pay | Admitting: Family Medicine

## 2020-03-16 ENCOUNTER — Ambulatory Visit (INDEPENDENT_AMBULATORY_CARE_PROVIDER_SITE_OTHER): Payer: Medicare HMO

## 2020-03-16 ENCOUNTER — Ambulatory Visit (INDEPENDENT_AMBULATORY_CARE_PROVIDER_SITE_OTHER): Payer: Medicare HMO | Admitting: Family Medicine

## 2020-03-16 ENCOUNTER — Encounter: Payer: Self-pay | Admitting: Family

## 2020-03-16 ENCOUNTER — Ambulatory Visit: Payer: Self-pay

## 2020-03-16 ENCOUNTER — Ambulatory Visit: Payer: Medicare HMO | Admitting: Family

## 2020-03-16 ENCOUNTER — Other Ambulatory Visit: Payer: Self-pay

## 2020-03-16 VITALS — BP 100/67 | HR 74 | Temp 97.9°F | Ht 66.0 in | Wt 208.0 lb

## 2020-03-16 DIAGNOSIS — Z6833 Body mass index (BMI) 33.0-33.9, adult: Secondary | ICD-10-CM | POA: Diagnosis not present

## 2020-03-16 DIAGNOSIS — M79672 Pain in left foot: Secondary | ICD-10-CM | POA: Diagnosis not present

## 2020-03-16 DIAGNOSIS — R7303 Prediabetes: Secondary | ICD-10-CM

## 2020-03-16 DIAGNOSIS — M79671 Pain in right foot: Secondary | ICD-10-CM

## 2020-03-16 DIAGNOSIS — R0602 Shortness of breath: Secondary | ICD-10-CM | POA: Diagnosis not present

## 2020-03-16 DIAGNOSIS — G8929 Other chronic pain: Secondary | ICD-10-CM

## 2020-03-16 DIAGNOSIS — M25562 Pain in left knee: Secondary | ICD-10-CM

## 2020-03-16 DIAGNOSIS — E559 Vitamin D deficiency, unspecified: Secondary | ICD-10-CM

## 2020-03-16 DIAGNOSIS — E669 Obesity, unspecified: Secondary | ICD-10-CM

## 2020-03-16 MED ORDER — LIDOCAINE HCL 1 % IJ SOLN
2.0000 mL | INTRAMUSCULAR | Status: AC | PRN
  Administered 2020-03-16: 2 mL

## 2020-03-16 MED ORDER — METHYLPREDNISOLONE ACETATE 40 MG/ML IJ SUSP
40.0000 mg | INTRAMUSCULAR | Status: AC | PRN
  Administered 2020-03-16: 40 mg

## 2020-03-16 MED ORDER — VITAMIN D (ERGOCALCIFEROL) 1.25 MG (50000 UNIT) PO CAPS: 50000.0000 [IU] | ORAL_CAPSULE | ORAL | 0 refills | Status: DC

## 2020-03-16 NOTE — Progress Notes (Signed)
Office Visit Note   Patient: [name]           Date of Birth: 1965-10-20           MRN: [id number] Visit Date: 03/16/2020              Requested by: Mayra Neer, MD [address] [place],  [place] [address] PCP: Mayra Neer, MD  Chief Complaint  Patient presents with  . Left Foot - Pain      HPI: Patient is a 54 year old woman who presents today complaining of a 63-monthhistory of left heel pain.  She does not have any falls no associated injury she is currently wearing [place] tennis shoes that she has been wearing for years now for support.  She states she cannot tolerate hardly any of her other shoewear cannot walk barefoot.  She has gradually worsening pain with prolonged weightbearing.  Cannot tolerate being barefoot pain primarily to the plantar aspect of her heel some along the medial side of her calcaneus.  She is used Advil with modest relief she is also been using ice with modest relief  She is also complaining of some chronic left knee pain.  This is primarily deep behind her kneecap somewhat over the medial joint line she has been having some intermittent buckling of her knee this is been ongoing for many years.  She was last seen for the same in January 2020 at that time she had a cortisone shot which provided her with some moderate relief of her symptoms things gradually improved and did plateau following the injection she is continued with pain since then she would like to get to the bottom of the reason for her pain  Assessment & Plan: Visit Diagnoses:  1. Heel pain, chronic, left   2. Heel pain, chronic, right     Plan: Deferred injection of the left knee.  We will proceed with MRI evaluate for meniscal injury of the left knee she will follow-up with Dr. DSharol Givenfor MRI review.  At that time they will reevaluate her left heel.  Depo-Medrol injection today for plantar fasciitis on the left.  Follow-Up Instructions: No follow-ups on file.   Left  Knee Exam   Muscle Strength  The patient has normal left knee strength.  Tenderness  The patient is experiencing tenderness in the medial joint line.  Range of Motion  The patient has normal left knee ROM.  Tests  Varus: negative Valgus: negative  Other  Erythema: absent Swelling: none Effusion: no effusion present      Patient is alert, oriented, no adenopathy, well-dressed, normal affect, normal respiratory effort. The left foot is plantigrade.  There is point tenderness to the origin of plantar fascia.  Some mild tenderness with palpation of the medial aspect of the calcaneus.  Imaging: No results found. No images are attached to the encounter.  Labs: Lab Results  Component Value Date   HGBA1C 5.8 (H) 10/15/2019   ESRSEDRATE 39 (H) 01/22/2018   ESRSEDRATE 17 03/01/2017   ESRSEDRATE 16 07/22/2015   CRP 0.3 (L) 07/22/2015   LABURIC 5.1 05/07/2018     Lab Results  Component Value Date   ALBUMIN 4.1 02/17/2020   ALBUMIN 4.2 10/27/2019   ALBUMIN CANCELED 10/15/2019   LABURIC 5.1 05/07/2018    No results found for: MG Lab Results  Component Value Date   VD25OH 22.5 (L) 10/27/2019   VD25OH CANCELED 10/15/2019   VD25OH 24 (L) 05/15/2016  No results found for: PREALBUMIN CBC EXTENDED Latest Ref Rng & Units 02/17/2020 11/03/2019 08/19/2019  WBC 3.4 - 10.8 x10E3/uL 7.1 6.6 6.9  RBC 3.77 - 5.28 x10E6/uL 3.61(L) 3.93 4.01  HGB 11.1 - 15.9 g/dL 11.9 12.6 13.1  HCT 34.0 - 46.6 % 34.2 38.4 37.8  PLT 150 - 450 x10E3/uL 244 315 273  NEUTROABS 1 - 7 x10E3/uL 4.2 3,399 2.8  LYMPHSABS 0 - 3 x10E3/uL 2.2 2,528 3.3(H)     There is no height or weight on file to calculate BMI.  Orders:  Orders Placed This Encounter  Procedures  . XR Os Calcis Left   No orders of the defined types were placed in this encounter.    Procedures: Foot Inj  Date/Time: 03/16/2020 11:30 AM Performed by: Suzan Slick, NP Authorized by: Suzan Slick, NP   Consent Given by:   Patient Site marked: the procedure site was marked   Timeout: prior to procedure the correct patient, procedure, and site was verified   Indications:  Fasciitis and pain Condition: Plantar Fasciitis   Location: left plantar fascia muscle   Prep: patient was prepped and draped in usual sterile fashion   Needle Size:  22 G Medications:  2 mL lidocaine 1 %; 40 mg methylPREDNISolone acetate 40 MG/ML Patient Tolerance:  Patient tolerated the procedure well with no immediate complications    Clinical Data: No additional findings.  ROS:  All other systems negative, except as noted in the HPI. Review of Systems  Constitutional: Negative for chills and fever.  Musculoskeletal: Positive for arthralgias, gait problem and myalgias.    Objective: Vital Signs: There were no vitals taken for this visit.  Specialty Comments:  No specialty comments available.  PMFS History: Patient Active Problem List   Diagnosis Date Noted  . Prediabetes 01/13/2020  . Vitamin D deficiency 12/24/2019  . Depression 12/24/2019  . Class 1 obesity with serious comorbidity and body mass index (BMI) of 34.0 to 34.9 in adult 12/24/2019  . Tobacco use 08/06/2019  . Allergic rhinitis 04/19/2018  . Obstructive sleep apnea syndrome, mild 04/19/2018  . COPD (chronic obstructive pulmonary disease) ([place]) 03/26/2018  . Rectal bleeding 01/22/2018  . LLQ abdominal pain 01/22/2018  . Dysphagia 01/12/2017  . 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 Primary osteoarthritis of both knees 08/11/2016  . History of hypertension 08/11/2016  . History of ulcerative colitis 08/11/2016  . History of IBS 08/11/2016  . 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 High risk medications (not anticoagulants) long-term use 05/13/2016  . Knee pain, bilateral 05/13/2016  . Anserine bursitis 05/13/2016  . 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 Chondromalacia patellae, right knee 05/13/2016  .  Psoriatic arthritis ([place]) 05/12/2016  . UNSPECIFIED HYPOTHYROIDISM 08/24/2009  . HYPERLIPIDEMIA 08/24/2009  . Essential hypertension 08/24/2009  . GERD 08/24/2009  . Ulcerative colitis ([place]) 08/24/2009  . IRRITABLE BOWEL SYNDROME 08/24/2009  . OVARIAN CYST 08/24/2009  . PSORIASIS 08/24/2009  . ARTHRITIS 08/24/2009  . TACHYCARDIA, HX OF 08/24/2009  . PANCREATITIS, HX OF 08/24/2009  . FIBROCYSTIC BREAST DISEASE, HX OF 08/24/2009   Past Medical History:  Diagnosis Date  . Allergy   . Anxiety   . Arthritis   . Asthma   . Back pain   . Cataract    both eyes  surgically repaired  . COPD (chronic obstructive pulmonary disease) ([place])   . Depression    treated  . Dyspnea   . Fibrocystic breast disease   .  GERD (gastroesophageal reflux disease)   . History of hiatal hernia    repaired  . History of kidney stones    questionable  . Hyperlipidemia   . Hypertension    pt denies  . Hypothyroidism   . Irritable bowel syndrome   . Joint pain   . Osteoarthritis   . Ovarian cyst   . Palpitations   . Pancreatitis   . PONV (postoperative nausea and vomiting)    NO TROUBLE WITH CATARACT SURGERY  . Psoriasis   . Psoriatic arthritis ([place]) 05/12/2016   Poor response to Humira Inadequate response to Enbrel Inadequate response to Simponi  . Shortness of breath   . Tachycardia   . Ulcerative colitis     Family History  Problem Relation Age of Onset  . Breast cancer Mother 38  . Heart disease Mother   . Colon polyps Mother   . Other Mother        pre-cancerous tumor removed  . Colon cancer Mother   . Hypertension Mother   . Hyperlipidemia Mother   . Cancer Mother   . Depression Mother   . Heart disease Father   . Hypertension Father   . Hyperlipidemia Father   . Stroke Father   . Alcohol abuse Father   . Crohn's disease Sister   . Alcoholism Sister   . Irritable bowel syndrome Other   . Crohn's disease Daughter   . Breast cancer Paternal Aunt   . Breast cancer Maternal  Grandmother   . Colon polyps Paternal Grandmother   . Esophageal cancer Neg Hx   . Rectal cancer Neg Hx   . Stomach cancer Neg Hx   . Pancreatic cancer Neg Hx     Past Surgical History:  Procedure Laterality Date  . ABDOMINAL HYSTERECTOMY    . ANKLE RECONSTRUCTION     left  . bladder tuck    . CATARACT EXTRACTION W/PHACO Right 09/11/2017   Procedure: CATARACT EXTRACTION PHACO AND INTRAOCULAR LENS PLACEMENT (IOC);  Surgeon: Birder Robson, MD;  Location: [facility];  Service: Ophthalmology;  Laterality: Right;  [place] 00:18.5 AP% 6.9 CDE 1.29 Fluid Pack Lot # [id number]  . CATARACT EXTRACTION W/PHACO Left 10/02/2017   Procedure: CATARACT EXTRACTION PHACO AND INTRAOCULAR LENS PLACEMENT ([facility]);  Surgeon: Birder Robson, MD;  Location: [facility];  Service: Ophthalmology;  Laterality: Left;  [place] 00:25.9 AP% 6.9 CDE 1.80 Fluid Pack Lot # [id number]  . CLOSED REDUCTION NASAL FRACTURE N/A 05/21/2018   Procedure: CLOSED REDUCTION NASAL FRACTURE;  Surgeon: Clyde Canterbury, MD;  Location: [facility];  Service: ENT;  Laterality: N/A;  Latex sensitivity  . COLONOSCOPY    . EYE SURGERY     Lasik  . FRACTURE SURGERY    . KNEE SURGERY     left  . LAPAROSCOPIC NISSEN FUNDOPLICATION N/A 0/63/0160   Procedure: LAPAROSCOPIC NISSEN TAKEDOWN AND UPPER ENDOSCOPY;  Surgeon: Johnathan Hausen, MD;  Location: [facility];  Service: General;  Laterality: N/A;  . NASAL SEPTUM SURGERY    . NISSEN FUNDOPLICATION    . PARTIAL HYSTERECTOMY    . POLYPECTOMY    . repair cystocele and rectocele    . thyroid ablation    . TUBAL LIGATION    . UPPER GASTROINTESTINAL ENDOSCOPY    . UPPER GI ENDOSCOPY  01/12/2017   Procedure: UPPER GI ENDOSCOPY;  Surgeon: Johnathan Hausen, MD;  Location: [facility];  Service: General;;   Social History   Occupational History  . 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 Occupation: disabled  Tobacco Use  . Smoking status: Former Smoker    Packs/day: 1.00     Years: 28.00    Pack years: 28.00    Types: Cigarettes    Quit date: 07/27/2005    Years since quitting: 14.6  . Smokeless tobacco: Never Used  Vaping Use  . Vaping Use: Never used  Substance and Sexual Activity  . Alcohol use: Yes    Alcohol/week: 0.0 standard drinks    Comment:  occasionally (1x/mo)  . Drug use: No  . Sexual activity: Yes

## 2020-03-17 ENCOUNTER — Encounter (INDEPENDENT_AMBULATORY_CARE_PROVIDER_SITE_OTHER): Payer: Self-pay | Admitting: Family Medicine

## 2020-03-17 LAB — COMPREHENSIVE METABOLIC PANEL
ALT: 21 IU/L (ref 0–32)
AST: 21 IU/L (ref 0–40)
Albumin/Globulin Ratio: 1.6 (ref 1.2–2.2)
Albumin: 4.6 g/dL (ref 3.8–4.9)
Alkaline Phosphatase: 74 IU/L (ref 48–121)
BUN/Creatinine Ratio: 22 (ref 9–23)
BUN: 19 mg/dL (ref 6–24)
Bilirubin Total: 0.4 mg/dL (ref 0.0–1.2)
CO2: 23 mmol/L (ref 20–29)
Calcium: 9.9 mg/dL (ref 8.7–10.2)
Chloride: 101 mmol/L (ref 96–106)
Creatinine, Ser: 0.87 mg/dL (ref 0.57–1.00)
GFR calc Af Amer: 87 mL/min/{1.73_m2} (ref >59)
GFR calc non Af Amer: 76 mL/min/{1.73_m2} (ref >59)
Globulin, Total: 2.8 g/dL (ref 1.5–4.5)
Glucose: 90 mg/dL (ref 65–99)
Potassium: 4.7 mmol/L (ref 3.5–5.2)
Sodium: 139 mmol/L (ref 134–144)
Total Protein: 7.4 g/dL (ref 6.0–8.5)

## 2020-03-17 LAB — VITAMIN D 25 HYDROXY (VIT D DEFICIENCY, FRACTURES): Vit D, 25-Hydroxy: 48.9 ng/mL (ref 30.0–100.0)

## 2020-03-17 LAB — HEMOGLOBIN A1C
Est. average glucose Bld gHb Est-mCnc: 126 mg/dL
Hgb A1c MFr Bld: 6 % — ABNORMAL HIGH (ref 4.8–5.6)

## 2020-03-17 LAB — INSULIN, RANDOM: INSULIN: 15.2 u[IU]/mL (ref 2.6–24.9)

## 2020-03-17 NOTE — Telephone Encounter (Signed)
Please advise 

## 2020-03-17 NOTE — Progress Notes (Signed)
Chief Complaint:   OBESITY Jericho is here to discuss her progress with her obesity treatment plan along with follow-up of her obesity related diagnoses. Anaiya is on the Category 4 Plan with 6-8 oz of milk and states she is following her eating plan approximately 95% of the time. Denny states she is doing 0 minutes 0 times per week.  Today's visit was #: 9 Starting weight: 224 lbs Starting date: 10/15/2019 Today's weight: 208 lbs Today's date: 03/16/2020 Total lbs lost to date: 16 Total lbs lost since last in-office visit: 4  Interim History: Tonika voices the last few weeks have been plagued by plantar fascitis. Meal plan wise she has been almost completely on the plan. She notes very few cravings with the exception of french fries. She denies hunger. For snack calories she is doing pretzel crips, skinny cow ice cream sandwiches. She has no plans for the upcoming few weeks.  Subjective:   1. Pre-diabetes Jaleigha's last A1c was 5.8 and insulin 20.8. She is not on metformin.  2. Vitamin D deficiency Rosalynd denies nausea, vomiting, or muscle weakness, but she notes fatigue. She is on prescription Vit D.  3. SOB (shortness of breath) on exertion Ramia voices some improvement from initial appointment. She recently got her COVID booster.  Assessment/Plan:   1. Pre-diabetes Neilah will continue to work on weight loss, exercise, and decreasing simple carbohydrates to help decrease the risk of diabetes. We will check labs today.  - Comprehensive metabolic panel - Hemoglobin A1c - Insulin, random  2. Vitamin D deficiency Low Vitamin D level contributes to fatigue and are associated with obesity, breast, and colon cancer. We will check labs today, and we will refill prescription Vitamin D for 1 month. Mailani will follow-up for routine testing of Vitamin D, at least 2-3 times per year to avoid over-replacement.  - Vitamin D, Ergocalciferol, (DRISDOL) 1.25 MG (50000 UNIT) CAPS  capsule; Take 1 capsule (50,000 Units total) by mouth every 7 (seven) days.  Dispense: 4 capsule; Refill: 0 - VITAMIN D 25 Hydroxy (Vit-D Deficiency, Fractures)  3. SOB (shortness of breath) on exertion IC was repeated today. Loletha will continue to follow up as directed.  4. Class 1 obesity with serious comorbidity and body mass index (BMI) of 33.0 to 33.9 in adult, unspecified obesity type Karagan is currently in the action stage of change. As such, her goal is to continue with weight loss efforts. She has agreed to the Category 4 Plan with 8 oz of meat at dinner.   Exercise goals: All adults should avoid inactivity. Some physical activity is better than none, and adults who participate in any amount of physical activity gain some health benefits. Start Vinyasa yoga for 15 minutes 3 times per week.  Behavioral modification strategies: increasing lean protein intake, increasing vegetables, meal planning and cooking strategies, keeping healthy foods in the home and planning for success.  Walker has agreed to follow-up with our clinic in 2 weeks. She was informed of the importance of frequent follow-up visits to maximize her success with intensive lifestyle modifications for her multiple health conditions.   Tywanda was informed we would discuss her lab results at her next visit unless there is a critical issue that needs to be addressed sooner. Holy agreed to keep her next visit at the agreed upon time to discuss these results.  Objective:   Blood pressure 100/67, pulse 74, temperature 97.9 F (36.6 C), temperature source Oral, height 5' 6"  (1.676 m), weight  208 lb (94.3 kg), SpO2 95 %. Body mass index is 33.57 kg/m.  General: Cooperative, alert, well developed, in no acute distress. HEENT: Conjunctivae and lids unremarkable. Cardiovascular: Regular rhythm.  Lungs: Normal work of breathing. Neurologic: No focal deficits.   Lab Results  Component Value Date   CREATININE 0.87 03/16/2020    BUN 19 03/16/2020   NA 139 03/16/2020   K 4.7 03/16/2020   CL 101 03/16/2020   CO2 23 03/16/2020   Lab Results  Component Value Date   ALT 21 03/16/2020   AST 21 03/16/2020   ALKPHOS 74 03/16/2020   BILITOT 0.4 03/16/2020   Lab Results  Component Value Date   HGBA1C 6.0 (H) 03/16/2020   HGBA1C 5.8 (H) 10/15/2019   Lab Results  Component Value Date   INSULIN 15.2 03/16/2020   INSULIN 20.8 10/27/2019   INSULIN CANCELED 10/15/2019   Lab Results  Component Value Date   TSH 1.940 10/27/2019   Lab Results  Component Value Date   CHOL 153 05/07/2018   HDL 61 05/07/2018   LDLCALC 75 05/07/2018   TRIG 90 05/07/2018   CHOLHDL 2.5 05/07/2018   Lab Results  Component Value Date   WBC 7.1 02/17/2020   HGB 11.9 02/17/2020   HCT 34.2 02/17/2020   MCV 95 02/17/2020   PLT 244 02/17/2020   Lab Results  Component Value Date   IRON 85 05/31/2015   FERRITIN 55.2 03/28/2018    Obesity Behavioral Intervention Documentation for Insurance:   Approximately 15 minutes were spent on the discussion below.  ASK: We discussed the diagnosis of obesity with [name] today and Lear agreed to give [place] permission to discuss obesity behavioral modification therapy today.  ASSESS: Cinzia has the diagnosis of obesity and her BMI today is 33.59. Shay is in the action stage of change.   ADVISE: Mackinley was educated on the multiple health risks of obesity as well as the benefit of weight loss to improve her health. She was advised of the need for long term treatment and the importance of lifestyle modifications to improve her current health and to decrease her risk of future health problems.  AGREE: Multiple dietary modification options and treatment options were discussed and Katye agreed to follow the recommendations documented in the above note.  ARRANGE: Deysi was educated on the importance of frequent visits to treat obesity as outlined per CMS and USPSTF guidelines and agreed to  schedule her next follow up appointment today.  Attestation Statements:   Reviewed by clinician on day of visit: allergies, medications, problem list, medical history, surgical history, family history, social history, and previous encounter notes.   I, Trixie Dredge, am acting as transcriptionist for Coralie Common, MD.  I have reviewed the above documentation for accuracy and completeness, and I agree with the above. - Jinny Blossom, MD

## 2020-03-18 ENCOUNTER — Other Ambulatory Visit (INDEPENDENT_AMBULATORY_CARE_PROVIDER_SITE_OTHER): Payer: Self-pay | Admitting: Physician Assistant

## 2020-03-18 DIAGNOSIS — E559 Vitamin D deficiency, unspecified: Secondary | ICD-10-CM

## 2020-03-24 ENCOUNTER — Encounter: Payer: Self-pay | Admitting: Acute Care

## 2020-03-24 ENCOUNTER — Ambulatory Visit (INDEPENDENT_AMBULATORY_CARE_PROVIDER_SITE_OTHER): Payer: Medicare HMO | Admitting: Acute Care

## 2020-03-24 ENCOUNTER — Ambulatory Visit (INDEPENDENT_AMBULATORY_CARE_PROVIDER_SITE_OTHER)
Admission: RE | Admit: 2020-03-24 | Discharge: 2020-03-24 | Disposition: A | Discharge status: Home / Self Care | Payer: Medicare HMO | Source: Ambulatory Visit | Attending: Acute Care | Admitting: Acute Care

## 2020-03-24 ENCOUNTER — Other Ambulatory Visit: Payer: Self-pay

## 2020-03-24 VITALS — BP 110/70 | HR 74 | Temp 97.7°F | Ht 66.0 in | Wt 191.4 lb

## 2020-03-24 DIAGNOSIS — Z87891 Personal history of nicotine dependence: Secondary | ICD-10-CM | POA: Diagnosis not present

## 2020-03-24 DIAGNOSIS — Z122 Encounter for screening for malignant neoplasm of respiratory organs: Secondary | ICD-10-CM

## 2020-03-24 NOTE — Progress Notes (Signed)
Office Visit Note  Patient: [name]             Date of Birth: 08-10-1965           MRN: [id number]             PCP: Mayra Neer, MD Referring: Mayra Neer, MD Visit Date: 04/05/2020 Occupation: @GUAROCC @  Subjective:  Other (low back pain ) and Medication Management   History of Present Illness: [name] is a 54 y.o. female with history of psoriatic arthritis and osteoarthritis.  She states she continues to have some stiffness in her hands.  Her left PIP joint has been painful.  She is also experiencing discomfort in her left knee joint.  She was seeing Dr. Sharol Given and is scheduled to have MRI of her left knee joint tomorrow.  She states she also has some stiffness in her left heel in the morning.  She had a cortisone injection by Dr. Sharol Given recently.  Her psoriasis is better controlled.  Her UC is well controlled on Xeljanz.  Activities of Daily Living:  Patient reports morning stiffness for 30-45  minutes.   Patient Reports nocturnal pain.  Difficulty dressing/grooming: Denies Difficulty climbing stairs: Reports Difficulty getting out of chair: Denies Difficulty using hands for taps, buttons, cutlery, and/or writing: Reports  Review of Systems  Constitutional: Negative for fatigue.  HENT: Negative for mouth sores, mouth dryness and nose dryness.   Eyes: Negative for itching and dryness.  Respiratory: Positive for shortness of breath. Negative for difficulty breathing.   Cardiovascular: Positive for chest pain and palpitations.  Gastrointestinal: Positive for constipation and diarrhea. Negative for blood in stool.  Endocrine: Negative for increased urination.  Genitourinary: Negative for difficulty urinating.  Musculoskeletal: Positive for arthralgias, joint pain, myalgias, morning stiffness and myalgias. Negative for joint swelling and muscle tenderness.  Skin: Positive for rash. Negative for color change.  Allergic/Immunologic: Negative for susceptible to  infections.  Neurological: Positive for numbness. Negative for dizziness, headaches, memory loss and weakness.  Hematological: Negative for bruising/bleeding tendency.  Psychiatric/Behavioral: Negative for confusion.    PMFS History:  Patient Active Problem List   Diagnosis Date Noted  . Dyspnea on exertion 03/31/2020  . Prediabetes 01/13/2020  . Vitamin D deficiency 12/24/2019  . Depression 12/24/2019  . Class 1 obesity with serious comorbidity and body mass index (BMI) of 34.0 to 34.9 in adult 12/24/2019  . Tobacco use 08/06/2019  . Allergic rhinitis 04/19/2018  . Obstructive sleep apnea syndrome, mild 04/19/2018  . COPD (chronic obstructive pulmonary disease) ([place]) 03/26/2018  . Rectal bleeding 01/22/2018  . LLQ abdominal pain 01/22/2018  . Dysphagia 01/12/2017  . 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 Primary osteoarthritis of both knees 08/11/2016  . History of hypertension 08/11/2016  . History of ulcerative colitis 08/11/2016  . History of IBS 08/11/2016  . 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 High risk medications (not anticoagulants) long-term use 05/13/2016  . Knee pain, bilateral 05/13/2016  . Anserine bursitis 05/13/2016  . 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 Chondromalacia patellae, right knee 05/13/2016  . Psoriatic arthritis ([place]) 05/12/2016  . UNSPECIFIED HYPOTHYROIDISM 08/24/2009  . HYPERLIPIDEMIA 08/24/2009  . Essential hypertension 08/24/2009  . GERD 08/24/2009  . Ulcerative colitis ([place]) 08/24/2009  . IRRITABLE BOWEL SYNDROME 08/24/2009  . OVARIAN CYST 08/24/2009  . PSORIASIS 08/24/2009  . ARTHRITIS 08/24/2009  . TACHYCARDIA, HX OF 08/24/2009  . PANCREATITIS, HX OF 08/24/2009  . FIBROCYSTIC BREAST DISEASE,  HX OF 08/24/2009    Past Medical History:  Diagnosis Date  . Allergy   . Anxiety   . Arthritis   . Asthma   . Back pain   . Cataract    both eyes  surgically repaired  . COPD (chronic  obstructive pulmonary disease) ([place])   . Depression    treated  . Dyspnea   . Fibrocystic breast disease   . GERD (gastroesophageal reflux disease)   . History of hiatal hernia    repaired  . History of kidney stones    questionable  . Hyperlipidemia   . Hypertension    pt denies  . Hypothyroidism   . Irritable bowel syndrome   . Joint pain   . Osteoarthritis   . Ovarian cyst   . Palpitations   . Pancreatitis   . PONV (postoperative nausea and vomiting)    NO TROUBLE WITH CATARACT SURGERY  . Psoriasis   . Psoriatic arthritis ([place]) 05/12/2016   Poor response to Humira Inadequate response to Enbrel Inadequate response to Simponi  . Shortness of breath   . Tachycardia   . Ulcerative colitis     Family History  Problem Relation Age of Onset  . Breast cancer Mother 70  . Heart disease Mother   . Colon polyps Mother   . Other Mother        pre-cancerous tumor removed  . Colon cancer Mother   . Hypertension Mother   . Hyperlipidemia Mother   . Cancer Mother   . Depression Mother   . Heart disease Father   . Hypertension Father   . Hyperlipidemia Father   . Stroke Father   . Alcohol abuse Father   . Crohn's disease Sister   . Alcoholism Sister   . Irritable bowel syndrome Other   . Crohn's disease Daughter   . Breast cancer Paternal Aunt   . Breast cancer Maternal Grandmother   . Colon polyps Paternal Grandmother   . Esophageal cancer Neg Hx   . Rectal cancer Neg Hx   . Stomach cancer Neg Hx   . Pancreatic cancer Neg Hx    Past Surgical History:  Procedure Laterality Date  . ABDOMINAL HYSTERECTOMY    . ANKLE RECONSTRUCTION     left  . bladder tuck    . CATARACT EXTRACTION W/PHACO Right 09/11/2017   Procedure: CATARACT EXTRACTION PHACO AND INTRAOCULAR LENS PLACEMENT (IOC);  Surgeon: Birder Robson, MD;  Location: [facility];  Service: Ophthalmology;  Laterality: Right;  [place] 00:18.5 AP% 6.9 CDE 1.29 Fluid Pack Lot # [id number]  . CATARACT EXTRACTION W/PHACO  Left 10/02/2017   Procedure: CATARACT EXTRACTION PHACO AND INTRAOCULAR LENS PLACEMENT ([place]);  Surgeon: Birder Robson, MD;  Location: [facility];  Service: Ophthalmology;  Laterality: Left;  [place] 00:25.9 AP% 6.9 CDE 1.80 Fluid Pack Lot # [id number]  . CLOSED REDUCTION NASAL FRACTURE N/A 05/21/2018   Procedure: CLOSED REDUCTION NASAL FRACTURE;  Surgeon: Clyde Canterbury, MD;  Location: [facility];  Service: ENT;  Laterality: N/A;  Latex sensitivity  . COLONOSCOPY    . EYE SURGERY     Lasik  . FRACTURE SURGERY    . KNEE SURGERY     left  . LAPAROSCOPIC NISSEN FUNDOPLICATION N/A 8/91/6945   Procedure: LAPAROSCOPIC NISSEN TAKEDOWN AND UPPER ENDOSCOPY;  Surgeon: Johnathan Hausen, MD;  Location: [facility];  Service: General;  Laterality: N/A;  . NASAL SEPTUM SURGERY    . NISSEN FUNDOPLICATION    . PARTIAL HYSTERECTOMY    .  POLYPECTOMY    . repair cystocele and rectocele    . thyroid ablation    . TUBAL LIGATION    . UPPER GASTROINTESTINAL ENDOSCOPY    . UPPER GI ENDOSCOPY  01/12/2017   Procedure: UPPER GI ENDOSCOPY;  Surgeon: Johnathan Hausen, MD;  Location: [facility];  Service: General;;   Social History   Social History Narrative  . Not on file   Immunization History  Administered Date(s) Administered  . Influenza Inj Mdck Quad Pf 05/08/2017  . Influenza,inj,Quad PF,6+ Mos 03/08/2018, 03/29/2019, 06/25/2019  . Influenza-Unspecified 03/31/2020  . PFIZER SARS-COV-2 Vaccination 09/28/2019, 10/20/2019, 03/02/2020     Objective: Vital Signs: BP 116/75 (BP Location: Left Arm, Patient Position: Sitting, Cuff Size: Normal)   Pulse 73   Ht 5' 6"  (1.676 m)   Wt 215 lb 12.8 oz (97.9 kg)   BMI 34.83 kg/m    Physical Exam Vitals and nursing note reviewed.  Constitutional:      Appearance: She is well-developed.  HENT:     Head: Normocephalic and atraumatic.  Eyes:     Conjunctiva/sclera: Conjunctivae normal.  Cardiovascular:     Rate and Rhythm: Normal rate and regular rhythm.      Heart sounds: Normal heart sounds.  Pulmonary:     Effort: Pulmonary effort is normal.     Breath sounds: Normal breath sounds.  Abdominal:     General: Bowel sounds are normal.     Palpations: Abdomen is soft.  Musculoskeletal:     Cervical back: Normal range of motion.  Lymphadenopathy:     Cervical: No cervical adenopathy.  Skin:    General: Skin is warm and dry.     Capillary Refill: Capillary refill takes less than 2 seconds.  Neurological:     Mental Status: She is alert and oriented to person, place, and time.  Psychiatric:        Behavior: Behavior normal.      Musculoskeletal Exam: C-spine was in good range of motion.  Shoulder joints, elbow joints, wrist joints with good range of motion.  She had bilateral PIP and DIP thickening with no synovitis.  She has subluxation of left first PIP.  Hip joints are in good range of motion.  Knee joints point good range of motion without any warmth swelling or effusion.  She has some discomfort range of motion of her left knee.  She had tenderness over the left heel but no tenderness over the ankles or MTPs or PIPs was noted.  CDAI Exam: CDAI Score: -- Patient Global: --; Provider Global: -- Swollen: --; Tender: -- Joint Exam 04/05/2020   No joint exam has been documented for this visit   There is currently no information documented on the homunculus. Go to the Rheumatology activity and complete the homunculus joint exam.  Investigation: No additional findings.  Imaging: CT CHEST LUNG CA SCREEN LOW DOSE W/O CM  Result Date: 03/24/2020 CLINICAL DATA:  Thirty-seven pack-year smoking history, quitting 10 years ago. EXAM: CT CHEST WITHOUT CONTRAST LOW-DOSE FOR LUNG CANCER SCREENING TECHNIQUE: Multidetector CT imaging of the chest was performed following the standard protocol without IV contrast. COMPARISON:  02/08/2018 plain film.  No prior CT. FINDINGS: Cardiovascular: Aortic atherosclerosis. Normal heart size, without pericardial  effusion. Multivessel coronary artery atherosclerosis. Mediastinum/Nodes: No mediastinal or definite hilar adenopathy, given limitations of unenhanced CT. Surgical changes at the gastroesophageal junction. Lungs/Pleura: No pleural fluid. Isolate central right middle lobe pulmonary nodule of volume derived equivalent diameter 1.8 mm. Upper Abdomen: Normal  imaged portions of the liver, spleen, stomach, pancreas, gallbladder, adrenal glands, kidneys. Musculoskeletal: Lower thoracic spondylosis. IMPRESSION: 1. Lung-RADS 2, benign appearance or behavior. Continue annual screening with low-dose chest CT without contrast in 12 months. 2. Aortic Atherosclerosis (ICD10-I70.0) and Emphysema (ICD10-J43.9). 3. Age advanced coronary artery atherosclerosis. Recommend assessment of coronary risk factors and consideration of medical therapy. Electronically Signed   By: Abigail Miyamoto M.D.   On: 03/24/2020 13:48   XR Os Calcis Left  Result Date: 03/16/2020 Radiographs of the left heel negative for fracture no acute finding.  Some early calcaneal spurring plantar aspect.   Recent Labs: Lab Results  Component Value Date   WBC 7.1 02/17/2020   HGB 11.9 02/17/2020   PLT 244 02/17/2020   NA 139 03/16/2020   K 4.7 03/16/2020   CL 101 03/16/2020   CO2 23 03/16/2020   GLUCOSE 90 03/16/2020   BUN 19 03/16/2020   CREATININE 0.87 03/16/2020   BILITOT 0.4 03/16/2020   ALKPHOS 74 03/16/2020   AST 21 03/16/2020   ALT 21 03/16/2020   PROT 7.4 03/16/2020   ALBUMIN 4.6 03/16/2020   CALCIUM 9.9 03/16/2020   GFRAA 87 03/16/2020   QFTBGOLDPLUS NEGATIVE 11/03/2019    Speciality Comments: Inadequate response to Enbrel, Humira, Simponi,  Remicade-allergic response  Procedures:  No procedures performed Allergies: Demerol [meperidine], Imuran [azathioprine], Meperidine hcl, Remicade [infliximab], Sulfasalazine, Avelox [moxifloxacin], Doxycycline, Erythromycin, and Latex   Assessment / Plan:     Visit Diagnoses: Psoriatic  arthritis ([place]) -she had complaints of some stiffness and discomfort in her hand especially the left first PIP joint.  No synovitis was noted.  C subluxation of the left first PIP.  X-rays of bilateral hands and feet were reviewed which did not show any erosive changes.  Clinical findings things and x-ray findings are consistent with osteoarthritis.  She has noticed a lot of improvement in her symptoms since she has been on [place] and methotrexate combination.  Plan: XR Hand 2 View Right, XR Hand 2 View Left, XR Foot 2 Views Right, XR Foot 2 Views Left.  X-rays obtained of bilateral hands and bilateral feet today showed osteoarthritic changes.  No erosive changes were noted.  No radiographic progression was noted when compared to the x-rays of 2017.  Psoriasis-psoriasis rash is improved.  She has few scales left.  High risk medication use - Xeljanz 5 mg BID, MTX 0.8 ml sq once weekly, folic acid 2 mg po daily.  Her labs have been stable.  Have advised her to get labs every 3 months to monitor for drug toxicity.  Black box warning of increased risk of clotting, heart attack stroke and death was discussed today.  I have advised her to stay active and increase fluid intake.  I also discussed if possible she can take aspirin 81 mg 3 times a week that will reduce her risk of clotting.  Primary osteoarthritis of both knees-she has been experiencing pain in her left knee joint.  She had injury in the past.  She will be getting MRI per patient.  DDD (degenerative disc disease), lumbar-she is currently not having much discomfort.  Achilles tendonitis, bilateral-she states that she had left heel injection by Dr. Due to last week.  History of IBS  History of ulcerative colitis - Followed by Dr. Silverio Decamp.  Her UC is much better control since she is on [place].  History of hypertension  History of gastroesophageal reflux (GERD)  Class 1 obesity due to excess calories with serious  comorbidity and body mass  index (BMI) of 34.0 to 34.9 in adult - Going to weight loss clinic.  Weight loss diet and exercise was discussed at length.  Vitamin D deficiency  Former smoker - Followed by Dr. Lamonte Sakai  Educated about COVID-19 virus infection-she is fully vaccinated and also received booster.  Instructions were placed in the AVS.  Use of mask, social distancing and hand hygiene was discussed.  I also discussed that she may qualify for monoclonal antibody infusion in case she develops COVID-19 infection.  Orders: Orders Placed This Encounter  Procedures  . XR Hand 2 View Right  . XR Hand 2 View Left  . XR Foot 2 Views Right  . XR Foot 2 Views Left   No orders of the defined types were placed in this encounter.    Follow-Up Instructions: Return in about 5 months (around 09/05/2020) for Psoriatic arthritis, Osteoarthritis.   Bo Merino, MD  Note - This record has been created using Editor, commissioning.  Chart creation errors have been sought, but may not always  have been located. Such creation errors do not reflect on  the standard of medical care.

## 2020-03-24 NOTE — Progress Notes (Signed)
Shared Decision Making Visit Lung Cancer Screening Program [phone])   Eligibility:  Age 54 y.o.  Pack Years Smoking History Calculation 37.5 pack year smoking history (# packs/per year x # years smoked)  Recent History of coughing up blood  no  Unexplained weight loss? no ( >Than 15 pounds within the last 6 months )  Prior History Lung / other cancer no (Diagnosis within the last 5 years already requiring surveillance chest CT Scans).  Smoking Status Former Smoker  Former Smokers: Years since quit: 10 years  Quit Date: 2011  Visit Components:  Discussion included one or more decision making aids. yes  Discussion included risk/benefits of screening. yes  Discussion included potential follow up diagnostic testing for abnormal scans. yes  Discussion included meaning and risk of over diagnosis. yes  Discussion included meaning and risk of False Positives. yes  Discussion included meaning of total radiation exposure. yes  Counseling Included:  Importance of adherence to annual lung cancer LDCT screening. yes  Impact of comorbidities on ability to participate in the program. yes  Ability and willingness to under diagnostic treatment. yes  Smoking Cessation Counseling:  Current Smokers:   Discussed importance of smoking cessation. yes  Information about tobacco cessation classes and interventions provided to patient. yes  Patient provided with "ticket" for LDCT Scan. yes  Symptomatic Patient. no  CounselingNA  Diagnosis Code: Tobacco Use Z72.0  Asymptomatic Patient yes  Counseling (Intermediate counseling: > three minutes counseling) E0923  Former Smokers:   Discussed the importance of maintaining cigarette abstinence. yes  Diagnosis Code: Personal History of Nicotine Dependence. R00.762  Information about tobacco cessation classes and interventions provided to patient. Yes  Patient provided with "ticket" for LDCT Scan. yes  Written Order for Lung  Cancer Screening with LDCT placed in Epic. Yes (CT Chest Lung Cancer Screening Low Dose W/O CM) [id number] Z12.2-Screening of respiratory organs Z87.891-Personal history of nicotine dependence  BP 110/70 (BP Location: Left Arm, Cuff Size: Normal)   Pulse 74   Temp 97.7 F (36.5 C) (Oral)   Ht 5' 6"  (1.676 m)   Wt 191 lb 6.4 oz (86.8 kg)   SpO2 95%   BMI 30.89 kg/m    I spent 25 minutes of face to face time with [name] discussing the risks and benefits of lung cancer screening. We viewed a power point together that explained in detail the above noted topics. We took the time to pause the power point at intervals to allow for questions to be asked and answered to ensure understanding. We discussed that she had taken the single most powerful action possible to decrease her risk of developing lung cancer when she quit smoking. I counseled her to remain smoke free, and to contact me if she ever had the desire to smoke again so that I can provide resources and tools to help support the effort to remain smoke free. We discussed the time and location of the scan, and that either  Doroteo Glassman RN or I will call with the results within  24-48 hours of receiving them. She has my card and contact information in the event she needs to speak with me, in addition to a copy of the power point we reviewed as a resource. She verbalized understanding of all of the above and had no further questions upon leaving the office.     I explained to the patient that there has been a high incidence of coronary artery disease noted on these exams. I explained  that this is a non-gated exam therefore degree or severity cannot be determined. This patient is currently on statin therapy. I have asked the patient to follow-up with their PCP regarding any incidental finding of coronary artery disease and management with diet or medication as they feel is clinically indicated. The patient verbalized understanding of the above and  had no further questions.     Carla Spatz, NP 03/24/2020 10:18 AM

## 2020-03-24 NOTE — Patient Instructions (Signed)
Thank you for participating in the [facility]. It was our pleasure to meet you today. We will call you with the results of your scan within the next few days. Your scan will be assigned a Lung RADS category score by the physicians reading the scans.  This Lung RADS score determines follow up scanning.  See below for description of categories, and follow up screening recommendations. We will be in touch to schedule your follow up screening annually or based on recommendations of our providers. We will fax a copy of your scan results to your Primary Care Physician, or the physician who referred you to the program, to ensure they have the results. Please call the office if you have any questions or concerns regarding your scanning experience or results.  Our office number is [phone]. Please speak with Denise Phelps, RN. She is our Lung Cancer Screening RN. If she is unavailable when you call, please have the office staff send her a message. She will return your call at her earliest convenience. Remember, if your scan is normal, we will scan you annually as long as you continue to meet the criteria for the program. (Age 55-77, Current smoker or smoker who has quit within the last 15 years). If you are a smoker, remember, quitting is the single most powerful action that you can take to decrease your risk of lung cancer and other pulmonary, breathing related problems. We know quitting is hard, and we are here to help.  Please let us know if there is anything we can do to help you meet your goal of quitting. If you are a former smoker, congratulations. We are proud of you! Remain smoke free! Remember you can refer friends or family members through the number above.  We will screen them to make sure they meet criteria for the program. Thank you for helping us take better care of you by participating in Lung Screening.  Lung RADS Categories:  Lung RADS 1: no nodules  or definitely non-concerning nodules.  Recommendation is for a repeat annual scan in 12 months.  Lung RADS 2:  nodules that are non-concerning in appearance and behavior with a very low likelihood of becoming an active cancer. Recommendation is for a repeat annual scan in 12 months.  Lung RADS 3: nodules that are probably non-concerning , includes nodules with a low likelihood of becoming an active cancer.  Recommendation is for a 6-month repeat screening scan. Often noted after an upper respiratory illness. We will be in touch to make sure you have no questions, and to schedule your 6-month scan.  Lung RADS 4 A: nodules with concerning findings, recommendation is most often for a follow up scan in 3 months or additional testing based on our provider's assessment of the scan. We will be in touch to make sure you have no questions and to schedule the recommended 3 month follow up scan.  Lung RADS 4 B:  indicates findings that are concerning. We will be in touch with you to schedule additional diagnostic testing based on our provider's  assessment of the scan.   

## 2020-03-30 ENCOUNTER — Encounter (INDEPENDENT_AMBULATORY_CARE_PROVIDER_SITE_OTHER): Payer: Self-pay | Admitting: Family Medicine

## 2020-03-30 ENCOUNTER — Ambulatory Visit (INDEPENDENT_AMBULATORY_CARE_PROVIDER_SITE_OTHER): Payer: Medicare HMO | Admitting: Family Medicine

## 2020-03-30 ENCOUNTER — Other Ambulatory Visit: Payer: Self-pay

## 2020-03-30 VITALS — BP 96/65 | HR 70 | Temp 97.9°F | Ht 66.0 in | Wt 207.0 lb

## 2020-03-30 DIAGNOSIS — E8881 Metabolic syndrome: Secondary | ICD-10-CM | POA: Diagnosis not present

## 2020-03-30 DIAGNOSIS — Z6833 Body mass index (BMI) 33.0-33.9, adult: Secondary | ICD-10-CM

## 2020-03-30 DIAGNOSIS — R7303 Prediabetes: Secondary | ICD-10-CM | POA: Diagnosis not present

## 2020-03-30 DIAGNOSIS — E559 Vitamin D deficiency, unspecified: Secondary | ICD-10-CM | POA: Diagnosis not present

## 2020-03-30 DIAGNOSIS — E669 Obesity, unspecified: Secondary | ICD-10-CM

## 2020-03-30 MED ORDER — VITAMIN D (ERGOCALCIFEROL) 1.25 MG (50000 UNIT) PO CAPS: 50000.0000 [IU] | ORAL_CAPSULE | ORAL | 0 refills | Status: DC

## 2020-03-30 MED ORDER — VICTOZA 18 MG/3ML ~~LOC~~ SOPN: 0.6000 mg | PEN_INJECTOR | Freq: Every day | SUBCUTANEOUS | 0 refills | Status: DC

## 2020-03-30 MED ORDER — METFORMIN HCL 500 MG PO TABS: 500.0000 mg | ORAL_TABLET | Freq: Every day | ORAL | 0 refills | Status: DC

## 2020-03-31 ENCOUNTER — Other Ambulatory Visit: Payer: Self-pay

## 2020-03-31 ENCOUNTER — Encounter: Payer: Self-pay | Admitting: Emergency Medicine

## 2020-03-31 ENCOUNTER — Ambulatory Visit: Payer: Medicare HMO | Admitting: Emergency Medicine

## 2020-03-31 ENCOUNTER — Other Ambulatory Visit: Payer: Self-pay | Admitting: Emergency Medicine

## 2020-03-31 DIAGNOSIS — R0609 Other forms of dyspnea: Secondary | ICD-10-CM

## 2020-03-31 DIAGNOSIS — G4733 Obstructive sleep apnea (adult) (pediatric): Secondary | ICD-10-CM | POA: Diagnosis not present

## 2020-03-31 DIAGNOSIS — J449 Chronic obstructive pulmonary disease, unspecified: Secondary | ICD-10-CM

## 2020-03-31 DIAGNOSIS — J301 Allergic rhinitis due to pollen: Secondary | ICD-10-CM | POA: Diagnosis not present

## 2020-03-31 DIAGNOSIS — R0600 Dyspnea, unspecified: Secondary | ICD-10-CM

## 2020-03-31 MED ORDER — BD PEN NEEDLE NANO 2ND GEN 32G X 4 MM MISC: 1.0000 | Freq: Two times a day (BID) | 0 refills | Status: DC

## 2020-03-31 NOTE — Progress Notes (Signed)
Chief Complaint:   OBESITY Keeghan is here to discuss her progress with her obesity treatment plan along with follow-up of her obesity related diagnoses. Idabelle is on the Category 4 Plan with 8 oz of meat at dinner and states she is following her eating plan approximately 95% of the time. Shaterra states she is doing yoga for 30 minutes 3 times per week.  Today's visit was #: 10 Starting weight: 224 lbs Starting date: 10/15/2019 Today's weight: 207 lbs Today's date: 03/30/2020 Total lbs lost to date: 17 Total lbs lost since last in-office visit: 1  Interim History: [place] is getting tired of eggs. She doesn't want to go back to cereal secondary to carbohydrate content. She is interested in possibly incorporating oatmeal. She is occasionally craving for things like fried chicken. She has no plans for travel.  Subjective:   1. Pre-diabetes Mouna's last A1c was 6.0 (done at her last appointment), and insulin of 15.2.  2. Vitamin D deficiency Jamie-Lee denies nausea, vomiting, or muscle weakness, but notes fatigue. Last Vit D level was 34.1.  3. Metabolic syndrome Beatric has a history of elevated cholesterol, elevated blood glucose, and increased waist circumference.  Assessment/Plan:   1. Pre-diabetes Valera will continue to work on weight loss, exercise, and decreasing simple carbohydrates to help decrease the risk of diabetes. Kamla agreed to start metformin 500 mg PO daily with no refills.  - metFORMIN (GLUCOPHAGE) 500 MG tablet; Take 1 tablet (500 mg total) by mouth daily with breakfast.  Dispense: 30 tablet; Refill: 0  2. Vitamin D deficiency Low Vitamin D level contributes to fatigue and are associated with obesity, breast, and colon cancer. Adelyne agreed to change prescription Vitamin D to 50,000 IU every 14 days with no refills. She will follow-up for routine testing of Vitamin D, at least 2-3 times per year to avoid over-replacement.  - Vitamin D, Ergocalciferol, (DRISDOL)  1.25 MG (50000 UNIT) CAPS capsule; Take 1 capsule (50,000 Units total) by mouth every 14 (fourteen) days.  Dispense: 6 capsule; Refill: 0  3. Metabolic syndrome Shantina agreed to start Victoza 0.6 mg SubQ daily with no refills.  - liraglutide (VICTOZA) 18 MG/3ML SOPN; Inject 0.6 mg into the skin daily.  Dispense: 9 mL; Refill: 0  4. Class 1 obesity with serious comorbidity and body mass index (BMI) of 33.0 to 33.9 in adult, unspecified obesity type Harolyn is currently in the action stage of change. As such, her goal is to continue with weight loss efforts. She has agreed to the Category 4 Plan with 8 oz of meat.   Exercise goals: All adults should avoid inactivity. Some physical activity is better than none, and adults who participate in any amount of physical activity gain some health benefits.  Behavioral modification strategies: increasing lean protein intake, meal planning and cooking strategies, keeping healthy foods in the home and planning for success.  Carolan has agreed to follow-up with our clinic in 2 weeks. She was informed of the importance of frequent follow-up visits to maximize her success with intensive lifestyle modifications for her multiple health conditions.   Objective:   Blood pressure 96/65, pulse 70, temperature 97.9 F (36.6 C), temperature source Oral, height 5' 6"  (1.676 m), weight 207 lb (93.9 kg), SpO2 96 %. Body mass index is 33.41 kg/m.  General: Cooperative, alert, well developed, in no acute distress. HEENT: Conjunctivae and lids unremarkable. Cardiovascular: Regular rhythm.  Lungs: Normal work of breathing. Neurologic: No focal deficits.   Lab Results  Component Value Date   CREATININE 0.87 03/16/2020   BUN 19 03/16/2020   NA 139 03/16/2020   K 4.7 03/16/2020   CL 101 03/16/2020   CO2 23 03/16/2020   Lab Results  Component Value Date   ALT 21 03/16/2020   AST 21 03/16/2020   ALKPHOS 74 03/16/2020   BILITOT 0.4 03/16/2020   Lab Results   Component Value Date   HGBA1C 6.0 (H) 03/16/2020   HGBA1C 5.8 (H) 10/15/2019   Lab Results  Component Value Date   INSULIN 15.2 03/16/2020   INSULIN 20.8 10/27/2019   INSULIN CANCELED 10/15/2019   Lab Results  Component Value Date   TSH 1.940 10/27/2019   Lab Results  Component Value Date   CHOL 153 05/07/2018   HDL 61 05/07/2018   LDLCALC 75 05/07/2018   TRIG 90 05/07/2018   CHOLHDL 2.5 05/07/2018   Lab Results  Component Value Date   WBC 7.1 02/17/2020   HGB 11.9 02/17/2020   HCT 34.2 02/17/2020   MCV 95 02/17/2020   PLT 244 02/17/2020   Lab Results  Component Value Date   IRON 85 05/31/2015   FERRITIN 55.2 03/28/2018    Obesity Behavioral Intervention:   Approximately 15 minutes were spent on the discussion below.  ASK: We discussed the diagnosis of obesity with [name] today and Chelci agreed to give [place] permission to discuss obesity behavioral modification therapy today.  ASSESS: Francie has the diagnosis of obesity and her BMI today is 33.43. Liliann is in the action stage of change.   ADVISE: Munira was educated on the multiple health risks of obesity as well as the benefit of weight loss to improve her health. She was advised of the need for long term treatment and the importance of lifestyle modifications to improve her current health and to decrease her risk of future health problems.  AGREE: Multiple dietary modification options and treatment options were discussed and Jazlynn agreed to follow the recommendations documented in the above note.  ARRANGE: Jalacia was educated on the importance of frequent visits to treat obesity as outlined per CMS and USPSTF guidelines and agreed to schedule her next follow up appointment today.  Attestation Statements:   Reviewed by clinician on day of visit: allergies, medications, problem list, medical history, surgical history, family history, social history, and previous encounter notes.   I, Trixie Dredge, am  acting as transcriptionist for Coralie Common, MD.  I have reviewed the above documentation for accuracy and completeness, and I agree with the above. - Jinny Blossom, MD

## 2020-03-31 NOTE — Assessment & Plan Note (Signed)
Mild obstruction makes with restriction.  She really is unsure that she is benefited from any bronchodilator regimen.  She was on Spiriva in the past.  Currently on Wixela.  I think we can stop the Wixela and see if she loses any ground.  She will pay attention to symptoms.  Can keep albuterol available to use as needed

## 2020-03-31 NOTE — Patient Instructions (Signed)
Stop Wixela for now. Keep albuterol available use 2 puffs if needed shortness breath, chest tightness, wheezing.  He can also pretreat exercise with 2 puffs. We will arrange for a cardiopulmonary exercise test Congratulations on your weight loss.  Keep up the good work.  Work on your exercise and conditioning. Follow Dr. Lamonte Sakai next available after your test so that we can review the results together.

## 2020-03-31 NOTE — Assessment & Plan Note (Addendum)
This is persistent even after treating her obstructive disease.  Question whether her restriction is the driving force behind her shortness of breath.  She has not had a cardiac evaluation.  I think she needs a cardiopulmonary exercise test to look for either pulmonary or cardiac limitation.  She is agreeable to do this and we will set it up.

## 2020-03-31 NOTE — Assessment & Plan Note (Signed)
Was unable to tolerate CPAP

## 2020-03-31 NOTE — Assessment & Plan Note (Signed)
Better control on this regimen.  Continue Flonase, Xyzal, Singulair

## 2020-03-31 NOTE — Progress Notes (Signed)
Subjective:    Patient ID: [name], female    DOB: July 31, 1965, 54 y.o.   MRN: [id number]  HPI  ROV 09/23/2018 --54 year old woman who follows up today for her history of former tobacco, COPD, new diagnosis of mild OSA (November 2019).  She also has psoriatic arthritis on methotrexate and Xeljanz, ulcerative colitis.  Finally she has allergic rhinitis for which she has been treated with Singulair, Xyzal, fluticasone nasal spray.  Chronic cough due to this and also reflux on omeprazole.  We tried her on CPAP to see if she can tolerate, get benefit.  She reports that she had difficulty with both nasal pillows and full face mask. She could not tolerate, gave it back. She has been pre-treating exercise w albuterol, uses prn about 1-2x a month. She can occasionally hear wheeze, again w exercise.   ROV 03/31/20 --54 year old with COPD and mild obstructive sleep apnea, psoriatic arthritis and ulcerative colitis on methotrexate and Xeljanz, allergic rhinitis and GERD with associated chronic cough.  We tried her on CPAP but she had difficulty tolerating and gave it back.  Currently managed on Flonase, Xyzal, Singulair, Prilosec.  She is on Wixella reliably. We did a trial of Spiriva Respimat but she wasn't sure that it did much.  She uses albuterol without much effect. She is working on diet >> has lost 17 lbs  LDCT 03/24/20 was RADS 2, no evidence ILD   Review of Systems  Constitutional: Negative for fever and unexpected weight change.  HENT: Positive for congestion, ear pain, postnasal drip, sinus pressure and sneezing. Negative for dental problem, nosebleeds, rhinorrhea and trouble swallowing.   Eyes: Negative for redness and itching.  Respiratory: Positive for cough, shortness of breath and wheezing. Negative for chest tightness.   Cardiovascular: Negative for palpitations and leg swelling.  Gastrointestinal: Negative for nausea and vomiting.  Genitourinary: Negative for dysuria.   Musculoskeletal: Negative for joint swelling.  Skin: Negative for rash.  Allergic/Immunologic: Negative.  Negative for environmental allergies, food allergies and immunocompromised state.  Neurological: Negative for headaches.  Hematological: Does not bruise/bleed easily.  Psychiatric/Behavioral: Negative for dysphoric mood. The patient is not nervous/anxious.         Objective:   Physical Exam Vitals:   03/31/20 1457  BP: 118/78  Pulse: 74  Temp: 97.8 F (36.6 C)  TempSrc: Oral  SpO2: 98%  Weight: 213 lb (96.6 kg)  Height: 5' 6"  (1.676 m)   Gen: Pleasant, overwt woman, in no distress,  normal affect  ENT: No lesions,  mouth clear,  oropharynx clear, no postnasal drip  Neck: No JVD, no stridor  Lungs: No use of accessory muscles, no wheeze, decreased at bases  Cardiovascular: RRR, heart sounds normal, no murmur or gallops, no peripheral edema  Musculoskeletal: No deformities, no cyanosis or clubbing  Neuro: alert, non focal  Skin: Warm, no lesions or rash      Assessment & Plan:  COPD (chronic obstructive pulmonary disease) (HCC) Mild obstruction makes with restriction.  She really is unsure that she is benefited from any bronchodilator regimen.  She was on Spiriva in the past.  Currently on Wixela.  I think we can stop the Wixela and see if she loses any ground.  She will pay attention to symptoms.  Can keep albuterol available to use as needed  Allergic rhinitis Better control on this regimen.  Continue Flonase, Xyzal, Singulair  Obstructive sleep apnea syndrome, mild Was unable to tolerate CPAP  Dyspnea on exertion This is  persistent even after treating her obstructive disease.  Question whether her restriction is the driving force behind her shortness of breath.  She has not had a cardiac evaluation.  I think she needs a cardiopulmonary exercise test to look for either pulmonary or cardiac limitation.  She is agreeable to do this and we will set it  up.  Baltazar Apo, MD, PhD 03/31/2020, 3:19 PM [facility] [phone] or if no answer [phone]

## 2020-04-01 NOTE — Progress Notes (Signed)
  Denise,  Please call patient and let them  know their  low dose Ct was read as a Lung RADS 2: nodules that are benign in appearance and behavior with a very low likelihood of becoming a clinically active cancer due to size or lack of growth. Recommendation per radiology is for a repeat LDCT in 12 months. .Please let them  know we will order and schedule their  annual screening scan for 03/2021. Please let them  know there was notation of CAD on their  scan.  Please remind the patient  that this is a non-gated exam therefore degree or severity of disease  cannot be determined. Please have them  follow up with their PCP regarding potential risk factor modification, dietary therapy or pharmacologic therapy if clinically indicated. Pt.  is  currently on statin therapy. Please place order for annual  screening scan for  03/2021 and fax results to PCP. Thanks so much.  Dr. 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 Multivessel coronary artery atherosclerosis. I see you already have her on Statin therapy, but wanted to make sure you knew so you can determine need for cards consult. Thanks so much

## 2020-04-02 ENCOUNTER — Other Ambulatory Visit: Payer: Self-pay | Admitting: *Deleted

## 2020-04-02 DIAGNOSIS — Z87891 Personal history of nicotine dependence: Secondary | ICD-10-CM

## 2020-04-05 ENCOUNTER — Ambulatory Visit: Payer: Self-pay

## 2020-04-05 ENCOUNTER — Encounter: Payer: Self-pay | Admitting: Rheumatology

## 2020-04-05 ENCOUNTER — Other Ambulatory Visit: Payer: Self-pay

## 2020-04-05 ENCOUNTER — Ambulatory Visit: Payer: Medicare HMO | Admitting: Rheumatology

## 2020-04-05 VITALS — BP 116/75 | HR 73 | Ht 66.0 in | Wt 215.8 lb

## 2020-04-05 DIAGNOSIS — M7661 Achilles tendinitis, right leg: Secondary | ICD-10-CM

## 2020-04-05 DIAGNOSIS — M170 Bilateral primary osteoarthritis of knee: Secondary | ICD-10-CM | POA: Diagnosis not present

## 2020-04-05 DIAGNOSIS — E559 Vitamin D deficiency, unspecified: Secondary | ICD-10-CM

## 2020-04-05 DIAGNOSIS — L409 Psoriasis, unspecified: Secondary | ICD-10-CM

## 2020-04-05 DIAGNOSIS — E6609 Other obesity due to excess calories: Secondary | ICD-10-CM

## 2020-04-05 DIAGNOSIS — L4050 Arthropathic psoriasis, unspecified: Secondary | ICD-10-CM

## 2020-04-05 DIAGNOSIS — Z7189 Other specified counseling: Secondary | ICD-10-CM

## 2020-04-05 DIAGNOSIS — Z6834 Body mass index (BMI) 34.0-34.9, adult: Secondary | ICD-10-CM

## 2020-04-05 DIAGNOSIS — M5136 Other intervertebral disc degeneration, lumbar region: Secondary | ICD-10-CM

## 2020-04-05 DIAGNOSIS — Z8719 Personal history of other diseases of the digestive system: Secondary | ICD-10-CM | POA: Diagnosis not present

## 2020-04-05 DIAGNOSIS — Z79899 Other long term (current) drug therapy: Secondary | ICD-10-CM

## 2020-04-05 DIAGNOSIS — E66811 Other obesity due to excess calories: Secondary | ICD-10-CM

## 2020-04-05 DIAGNOSIS — Z87891 Personal history of nicotine dependence: Secondary | ICD-10-CM

## 2020-04-05 DIAGNOSIS — M7662 Achilles tendinitis, left leg: Secondary | ICD-10-CM

## 2020-04-05 DIAGNOSIS — Z8679 Personal history of other diseases of the circulatory system: Secondary | ICD-10-CM

## 2020-04-05 DIAGNOSIS — M51369 Other intervertebral disc degeneration, lumbar region without mention of lumbar back pain or lower extremity pain: Secondary | ICD-10-CM

## 2020-04-05 NOTE — Patient Instructions (Addendum)
COVID-19 vaccine recommendations:   COVID-19 vaccine is recommended for everyone (unless you are allergic to a vaccine component), even if you are on a medication that suppresses your immune system.   If you are on Methotrexate, Cellcept (mycophenolate), Rinvoq, Morrie Sheldon, and Olumiant- hold the medication for 1 week after each vaccine. Hold Methotrexate for 2 weeks after the single dose COVID-19 vaccine.   If you are on Orencia subcutaneous injection - hold medication one week prior to and one week after the first COVID-19 vaccine dose (only).   If you are on Orencia IV infusions- time vaccination administration so that the first COVID-19 vaccination will occur four weeks after the infusion and postpone the subsequent infusion by one week.   If you are on Cyclophosphamide or Rituxan infusions please contact your doctor prior to receiving the COVID-19 vaccine.   Do not take Tylenol or any anti-inflammatory medications (NSAIDs) 24 hours prior to the COVID-19 vaccination.   There is no direct evidence about the efficacy of the COVID-19 vaccine in individuals who are on medications that suppress the immune system.   Even if you are fully vaccinated, and you are on any medications that suppress your immune system, please continue to wear a mask, maintain at least six feet social distance and practice hand hygiene.   If you develop a COVID-19 infection, please contact your PCP or our office to determine if you need antibody infusion.  The booster vaccine is now available for immunocompromised patients. It is advised that if you had Pfizer vaccine you should get [employer] booster.  If you had a Moderna vaccine then you should get a Moderna booster. Johnson and Wynetta Emery does not have a booster vaccine at this time.  Please see the following web sites for updated information.    [email or web address]-Patient-[email or web address]  [email or web address]   Standing Labs We placed an order today for your standing lab work.   Please have your standing labs drawn in member and every 3 months  If possible, please have your labs drawn 2 weeks prior to your appointment so that the provider can discuss your results at your appointment.  We have open lab daily Monday through Thursday from 8:30-12:30 PM and 1:30-4:30 PM and Friday from 8:30-12:30 PM and 1:30-4:00 PM at the office of Dr. Bo Merino, [facility].   Please be advised, patients with office appointments requiring lab work will take precedents over walk-in lab work.  If possible, please come for your lab work on Monday and Friday afternoons, as you may experience shorter wait times. The office is located at [address] [place], [place], [place] [address] No appointment is necessary.   Labs are drawn by [facility]. Please bring your co-pay at the time of your lab draw.  You may receive a bill from [facility] for your lab work.  If you wish to have your labs drawn at another location, please call the office 24 hours in advance to send orders.  If you have any questions regarding directions or hours of operation,  please call [phone].   As a reminder, please drink plenty of water prior to coming for your lab work. Thanks!

## 2020-04-06 ENCOUNTER — Ambulatory Visit
Admission: RE | Admit: 2020-04-06 | Discharge: 2020-04-06 | Disposition: A | Discharge status: Home / Self Care | Payer: Medicare HMO | Source: Ambulatory Visit | Attending: Family | Admitting: Family

## 2020-04-06 ENCOUNTER — Other Ambulatory Visit: Payer: Self-pay

## 2020-04-06 DIAGNOSIS — R531 Weakness: Secondary | ICD-10-CM | POA: Diagnosis not present

## 2020-04-06 DIAGNOSIS — G8929 Other chronic pain: Secondary | ICD-10-CM

## 2020-04-06 DIAGNOSIS — M1712 Unilateral primary osteoarthritis, left knee: Secondary | ICD-10-CM | POA: Diagnosis not present

## 2020-04-06 DIAGNOSIS — M7122 Synovial cyst of popliteal space [Baker], left knee: Secondary | ICD-10-CM | POA: Diagnosis not present

## 2020-04-06 DIAGNOSIS — R200 Anesthesia of skin: Secondary | ICD-10-CM | POA: Diagnosis not present

## 2020-04-12 ENCOUNTER — Ambulatory Visit: Payer: Medicare HMO | Admitting: Orthopedic Surgery

## 2020-04-12 ENCOUNTER — Encounter: Payer: Self-pay | Admitting: Orthopedic Surgery

## 2020-04-12 VITALS — Ht 66.0 in | Wt 215.0 lb

## 2020-04-12 DIAGNOSIS — G8929 Other chronic pain: Secondary | ICD-10-CM

## 2020-04-12 DIAGNOSIS — M79672 Pain in left foot: Secondary | ICD-10-CM | POA: Diagnosis not present

## 2020-04-12 DIAGNOSIS — M6702 Short Achilles tendon (acquired), left ankle: Secondary | ICD-10-CM

## 2020-04-12 DIAGNOSIS — M25562 Pain in left knee: Secondary | ICD-10-CM

## 2020-04-13 DIAGNOSIS — L4050 Arthropathic psoriasis, unspecified: Secondary | ICD-10-CM | POA: Diagnosis not present

## 2020-04-13 DIAGNOSIS — J449 Chronic obstructive pulmonary disease, unspecified: Secondary | ICD-10-CM | POA: Diagnosis not present

## 2020-04-13 DIAGNOSIS — L409 Psoriasis, unspecified: Secondary | ICD-10-CM | POA: Diagnosis not present

## 2020-04-13 DIAGNOSIS — E7800 Pure hypercholesterolemia, unspecified: Secondary | ICD-10-CM | POA: Diagnosis not present

## 2020-04-13 DIAGNOSIS — I10 Essential (primary) hypertension: Secondary | ICD-10-CM | POA: Diagnosis not present

## 2020-04-13 DIAGNOSIS — G4700 Insomnia, unspecified: Secondary | ICD-10-CM | POA: Diagnosis not present

## 2020-04-13 DIAGNOSIS — Z0000 Encounter for general adult medical examination without abnormal findings: Secondary | ICD-10-CM | POA: Diagnosis not present

## 2020-04-13 DIAGNOSIS — R7303 Prediabetes: Secondary | ICD-10-CM | POA: Diagnosis not present

## 2020-04-13 DIAGNOSIS — E039 Hypothyroidism, unspecified: Secondary | ICD-10-CM | POA: Diagnosis not present

## 2020-04-13 DIAGNOSIS — F322 Major depressive disorder, single episode, severe without psychotic features: Secondary | ICD-10-CM | POA: Diagnosis not present

## 2020-04-13 DIAGNOSIS — K5190 Ulcerative colitis, unspecified, without complications: Secondary | ICD-10-CM | POA: Diagnosis not present

## 2020-04-13 DIAGNOSIS — E559 Vitamin D deficiency, unspecified: Secondary | ICD-10-CM | POA: Diagnosis not present

## 2020-04-14 ENCOUNTER — Telehealth: Payer: Self-pay | Admitting: Internal Medicine

## 2020-04-14 ENCOUNTER — Encounter: Payer: Self-pay | Admitting: Orthopedic Surgery

## 2020-04-14 NOTE — Telephone Encounter (Signed)
Pt called and wanted to know if the test she is taking (cardio pulmonary exercise test) would be beneficial with her appt with Dr. Gasper Sells. Please call

## 2020-04-14 NOTE — Progress Notes (Signed)
Office Visit Note   Patient: [name]           Date of Birth: 07/10/1966           MRN: [id number] Visit Date: 04/12/2020              Requested by: Mayra Neer, MD [address] [place],  [place] [id number] PCP: Mayra Neer, MD  Chief Complaint  Patient presents with  . Left Knee - Follow-up    MRI review      HPI: Patient is a 54 year old woman who presents in follow-up for 2 separate issues.  #1 she has chronic left knee pain status post MRI scan she is used ibuprofen for her knee.  Patient also has chronic left heel pain she has received a steroid injection which did not help she states she has pain with the first step in the morning.  Assessment & Plan: Visit Diagnoses:  1. Chronic pain of left knee   2. Heel pain, chronic, left   3. Achilles tendon contracture, left     Plan: Recommended Achilles stretching to unload the plantar fascia also recommended stiff soled sneakers to help unload the plantar fascia.  Discussed the MRIs findings that are consistent with arthritis and MRI scan does not show any indication that arthroscopic intervention would be beneficial.  May try Voltaren gel for symptom relief.  Follow-Up Instructions: Return in about 4 weeks (around 05/10/2020).   Ortho Exam  Patient is alert, oriented, no adenopathy, well-dressed, normal affect, normal respiratory effort.  Review of patient's MRI scan of the left knee shows no acute findings of internal derangement the meniscus and collateral cruciate ligaments are intact patient does have chronic arthritis of her knee.  Patient previously had a lateral tibial plateau per MRI scan and 2014.  Patient has no pain to palpation over the tarsal tunnel lateral compression is negative she does have Achilles contracture with dorsiflexion only to neutral. She feels like this may have developed for her walking on her toes due to the psoriatic arthritis over the plantar midfoot.  She still  has a rash on the plantar aspect of her foot.  Examination the knee patient has tenderness to palpation over the medial plica there is no effusion.  Patient is also status post lateral ankle reconstruction with peroneal tendon.  Imaging: No results found. No images are attached to the encounter.  Labs: Lab Results  Component Value Date   HGBA1C 6.0 (H) 03/16/2020   HGBA1C 5.8 (H) 10/15/2019   ESRSEDRATE 39 (H) 01/22/2018   ESRSEDRATE 17 03/01/2017   ESRSEDRATE 16 07/22/2015   CRP 0.3 (L) 07/22/2015   LABURIC 5.1 05/07/2018     Lab Results  Component Value Date   ALBUMIN 4.6 03/16/2020   ALBUMIN 4.1 02/17/2020   ALBUMIN 4.2 10/27/2019   LABURIC 5.1 05/07/2018    No results found for: MG Lab Results  Component Value Date   VD25OH 48.9 03/16/2020   VD25OH 22.5 (L) 10/27/2019   VD25OH CANCELED 10/15/2019    No results found for: PREALBUMIN CBC EXTENDED Latest Ref Rng & Units 02/17/2020 11/03/2019 08/19/2019  WBC 3.4 - 10.8 x10E3/uL 7.1 6.6 6.9  RBC 3.77 - 5.28 x10E6/uL 3.61(L) 3.93 4.01  HGB 11.1 - 15.9 g/dL 11.9 12.6 13.1  HCT 34.0 - 46.6 % 34.2 38.4 37.8  PLT 150 - 450 x10E3/uL 244 315 273  NEUTROABS 1 - 7 x10E3/uL 4.2 3,399 2.8  LYMPHSABS 0 - 3 x10E3/uL  2.2 2,528 3.3(H)     Body mass index is 34.7 kg/m.  Orders:  No orders of the defined types were placed in this encounter.  No orders of the defined types were placed in this encounter.    Procedures: No procedures performed  Clinical Data: No additional findings.  ROS:  All other systems negative, except as noted in the HPI. Review of Systems  Objective: Vital Signs: Ht 5' 6"  (1.676 m)   Wt 215 lb (97.5 kg)   BMI 34.70 kg/m   Specialty Comments:  No specialty comments available.  PMFS History: Patient Active Problem List   Diagnosis Date Noted  . Dyspnea on exertion 03/31/2020  . Prediabetes 01/13/2020  . Vitamin D deficiency 12/24/2019  . Depression 12/24/2019  . Class 1 obesity with  serious comorbidity and body mass index (BMI) of 34.0 to 34.9 in adult 12/24/2019  . Tobacco use 08/06/2019  . Allergic rhinitis 04/19/2018  . Obstructive sleep apnea syndrome, mild 04/19/2018  . COPD (chronic obstructive pulmonary disease) ([place]) 03/26/2018  . Rectal bleeding 01/22/2018  . LLQ abdominal pain 01/22/2018  . Dysphagia 01/12/2017  . 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 Primary osteoarthritis of both knees 08/11/2016  . History of hypertension 08/11/2016  . History of ulcerative colitis 08/11/2016  . History of IBS 08/11/2016  . 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 High risk medications (not anticoagulants) long-term use 05/13/2016  . Knee pain, bilateral 05/13/2016  . Anserine bursitis 05/13/2016  . 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 Chondromalacia patellae, right knee 05/13/2016  . Psoriatic arthritis ([place]) 05/12/2016  . UNSPECIFIED HYPOTHYROIDISM 08/24/2009  . HYPERLIPIDEMIA 08/24/2009  . Essential hypertension 08/24/2009  . GERD 08/24/2009  . Ulcerative colitis ([place]) 08/24/2009  . IRRITABLE BOWEL SYNDROME 08/24/2009  . OVARIAN CYST 08/24/2009  . PSORIASIS 08/24/2009  . ARTHRITIS 08/24/2009  . TACHYCARDIA, HX OF 08/24/2009  . PANCREATITIS, HX OF 08/24/2009  . FIBROCYSTIC BREAST DISEASE, HX OF 08/24/2009   Past Medical History:  Diagnosis Date  . Allergy   . Anxiety   . Arthritis   . Asthma   . Back pain   . Cataract    both eyes  surgically repaired  . COPD (chronic obstructive pulmonary disease) ([place])   . Depression    treated  . Dyspnea   . Fibrocystic breast disease   . GERD (gastroesophageal reflux disease)   . History of hiatal hernia    repaired  . History of kidney stones    questionable  . Hyperlipidemia   . Hypertension    pt denies  . Hypothyroidism   . Irritable bowel syndrome   . Joint pain   . Osteoarthritis   . Ovarian cyst   . Palpitations   . Pancreatitis   . PONV  (postoperative nausea and vomiting)    NO TROUBLE WITH CATARACT SURGERY  . Psoriasis   . Psoriatic arthritis ([place]) 05/12/2016   Poor response to Humira Inadequate response to Enbrel Inadequate response to Simponi  . Shortness of breath   . Tachycardia   . Ulcerative colitis     Family History  Problem Relation Age of Onset  . Breast cancer Mother 92  . Heart disease Mother   . Colon polyps Mother   . Other Mother        pre-cancerous tumor removed  . Colon cancer Mother   . Hypertension Mother   . Hyperlipidemia Mother   . Cancer Mother   . Depression  Mother   . Heart disease Father   . Hypertension Father   . Hyperlipidemia Father   . Stroke Father   . Alcohol abuse Father   . Crohn's disease Sister   . Alcoholism Sister   . Irritable bowel syndrome Other   . Crohn's disease Daughter   . Breast cancer Paternal Aunt   . Breast cancer Maternal Grandmother   . Colon polyps Paternal Grandmother   . Esophageal cancer Neg Hx   . Rectal cancer Neg Hx   . Stomach cancer Neg Hx   . Pancreatic cancer Neg Hx     Past Surgical History:  Procedure Laterality Date  . ABDOMINAL HYSTERECTOMY    . ANKLE RECONSTRUCTION     left  . bladder tuck    . CATARACT EXTRACTION W/PHACO Right 09/11/2017   Procedure: CATARACT EXTRACTION PHACO AND INTRAOCULAR LENS PLACEMENT (IOC);  Surgeon: Birder Robson, MD;  Location: [facility];  Service: Ophthalmology;  Laterality: Right;  [place] 00:18.5 AP% 6.9 CDE 1.29 Fluid Pack Lot # [id number]  . CATARACT EXTRACTION W/PHACO Left 10/02/2017   Procedure: CATARACT EXTRACTION PHACO AND INTRAOCULAR LENS PLACEMENT ([place]);  Surgeon: Birder Robson, MD;  Location: [facility];  Service: Ophthalmology;  Laterality: Left;  [place] 00:25.9 AP% 6.9 CDE 1.80 Fluid Pack Lot # [id number]  . CLOSED REDUCTION NASAL FRACTURE N/A 05/21/2018   Procedure: CLOSED REDUCTION NASAL FRACTURE;  Surgeon: Clyde Canterbury, MD;  Location: [place];  Service: ENT;  Laterality: N/A;   Latex sensitivity  . COLONOSCOPY    . EYE SURGERY     Lasik  . FRACTURE SURGERY    . KNEE SURGERY     left  . LAPAROSCOPIC NISSEN FUNDOPLICATION N/A 9/62/8366   Procedure: LAPAROSCOPIC NISSEN TAKEDOWN AND UPPER ENDOSCOPY;  Surgeon: Johnathan Hausen, MD;  Location: [facility];  Service: General;  Laterality: N/A;  . NASAL SEPTUM SURGERY    . NISSEN FUNDOPLICATION    . PARTIAL HYSTERECTOMY    . POLYPECTOMY    . repair cystocele and rectocele    . thyroid ablation    . TUBAL LIGATION    . UPPER GASTROINTESTINAL ENDOSCOPY    . UPPER GI ENDOSCOPY  01/12/2017   Procedure: UPPER GI ENDOSCOPY;  Surgeon: Johnathan Hausen, MD;  [facility];  Service: General;;   Social History   Occupational History  . 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 Occupation: disabled  Tobacco Use  . Smoking status: Former Smoker    Packs/day: 1.00    Years: 37.50    Pack years: 37.50    Types: Cigarettes    Quit date: 07/27/2009    Years since quitting: 10.7  . Smokeless tobacco: Never Used  Vaping Use  . Vaping Use: Never used  Substance and Sexual Activity  . Alcohol use: Yes    Alcohol/week: 0.0 standard drinks    Comment:  occasionally (1x/mo)  . Drug use: No  . Sexual activity: Yes

## 2020-04-14 NOTE — Telephone Encounter (Signed)
Spoke with the patient who is schedule to see Dr. Gasper Sells 10/21 for CAD seen on CT scan. She is also being followed by pulmonology and is scheduled for a cardiopulmonary stress test. There is concern that her SOB might be more cardiac related and she is wondering if she should still have this test done or if there is a more specific cardiac examine that Dr. Gasper Sells thinks would be more beneficial. She is willing to go through with the test, she just wants to make sure it will be beneficial. Advised patient that this test was ordered to determine if she has any cardiac or pulmonary limitation.

## 2020-04-20 ENCOUNTER — Ambulatory Visit (INDEPENDENT_AMBULATORY_CARE_PROVIDER_SITE_OTHER): Payer: Medicare HMO | Admitting: Family Medicine

## 2020-04-22 ENCOUNTER — Other Ambulatory Visit (INDEPENDENT_AMBULATORY_CARE_PROVIDER_SITE_OTHER): Payer: Self-pay | Admitting: Family Medicine

## 2020-04-22 DIAGNOSIS — R7303 Prediabetes: Secondary | ICD-10-CM

## 2020-05-03 ENCOUNTER — Ambulatory Visit (HOSPITAL_COMMUNITY): Payer: Medicare HMO | Attending: Family Medicine

## 2020-05-03 ENCOUNTER — Other Ambulatory Visit: Payer: Self-pay

## 2020-05-03 DIAGNOSIS — R0609 Other forms of dyspnea: Secondary | ICD-10-CM

## 2020-05-03 DIAGNOSIS — R0600 Dyspnea, unspecified: Secondary | ICD-10-CM | POA: Insufficient documentation

## 2020-05-05 ENCOUNTER — Telehealth: Payer: Self-pay | Admitting: Emergency Medicine

## 2020-05-05 NOTE — Telephone Encounter (Signed)
Spoke with pt. She does NOT want a televisit scheduled to go over results. Pt simply would like a phone call with the results. She is fine with a clinical staff member going over the results with her.  RB - please advise on pt's stress test results. Thanks!

## 2020-05-06 ENCOUNTER — Encounter: Payer: Self-pay | Admitting: Internal Medicine

## 2020-05-06 ENCOUNTER — Other Ambulatory Visit: Payer: Self-pay | Admitting: *Deleted

## 2020-05-06 ENCOUNTER — Other Ambulatory Visit: Payer: Self-pay

## 2020-05-06 ENCOUNTER — Ambulatory Visit: Payer: Medicare HMO | Admitting: Internal Medicine

## 2020-05-06 VITALS — BP 128/76 | HR 81 | Ht 66.0 in | Wt 222.4 lb

## 2020-05-06 DIAGNOSIS — Z720 Tobacco use: Secondary | ICD-10-CM

## 2020-05-06 DIAGNOSIS — E7849 Other hyperlipidemia: Secondary | ICD-10-CM | POA: Diagnosis not present

## 2020-05-06 DIAGNOSIS — I10 Essential (primary) hypertension: Secondary | ICD-10-CM

## 2020-05-06 DIAGNOSIS — Z8279 Family history of other congenital malformations, deformations and chromosomal abnormalities: Secondary | ICD-10-CM | POA: Diagnosis not present

## 2020-05-06 DIAGNOSIS — I209 Angina pectoris, unspecified: Secondary | ICD-10-CM

## 2020-05-06 DIAGNOSIS — I700 Atherosclerosis of aorta: Secondary | ICD-10-CM

## 2020-05-06 LAB — CBC
Hematocrit: 38.7 % (ref 34.0–46.6)
Hemoglobin: 13.4 g/dL (ref 11.1–15.9)
MCH: 32.3 pg (ref 26.6–33.0)
MCHC: 34.6 g/dL (ref 31.5–35.7)
MCV: 93 fL (ref 79–97)
Platelets: 295 10*3/uL (ref 150–450)
RBC: 4.15 x10E6/uL (ref 3.77–5.28)
RDW: 12.2 % (ref 11.7–15.4)
WBC: 10.6 10*3/uL (ref 3.4–10.8)

## 2020-05-06 LAB — COMPREHENSIVE METABOLIC PANEL
ALT: 14 IU/L (ref 0–32)
AST: 21 IU/L (ref 0–40)
Albumin/Globulin Ratio: 1.4 (ref 1.2–2.2)
Albumin: 4.2 g/dL (ref 3.8–4.9)
Alkaline Phosphatase: 61 IU/L (ref 44–121)
BUN/Creatinine Ratio: 20 (ref 9–23)
BUN: 16 mg/dL (ref 6–24)
Bilirubin Total: 0.4 mg/dL (ref 0.0–1.2)
CO2: 26 mmol/L (ref 20–29)
Calcium: 9.2 mg/dL (ref 8.7–10.2)
Chloride: 100 mmol/L (ref 96–106)
Creatinine, Ser: 0.81 mg/dL (ref 0.57–1.00)
GFR calc Af Amer: 95 mL/min/{1.73_m2} (ref >59)
GFR calc non Af Amer: 83 mL/min/{1.73_m2} (ref >59)
Globulin, Total: 3 g/dL (ref 1.5–4.5)
Glucose: 92 mg/dL (ref 65–99)
Potassium: 4.4 mmol/L (ref 3.5–5.2)
Sodium: 138 mmol/L (ref 134–144)
Total Protein: 7.2 g/dL (ref 6.0–8.5)

## 2020-05-06 MED ORDER — ASPIRIN EC 81 MG PO TBEC: 81.0000 mg | DELAYED_RELEASE_TABLET | Freq: Every day | ORAL | 3 refills | Status: DC

## 2020-05-06 MED ORDER — NITROGLYCERIN 0.4 MG SL SUBL: 0.4000 mg | SUBLINGUAL_TABLET | SUBLINGUAL | 6 refills | Status: DC | PRN

## 2020-05-06 NOTE — Progress Notes (Signed)
Cardiology Office Note:    Date:  05/06/2020   ID:  [name], DOB 05-12-1966, MRN [id number]  PCP:  Mayra Neer, MD   CC: DOE Consulted for the evaluation of coronary artery calcification at the behest of Mayra Neer, MD   History of Present Illness:    [name] is a 54 y.o. female with a hx of HTN, HLD, Tachycardia, Former tobacco use- quit in 2011, Morbid Obesity, coronary calcium and aortic atherosclerosis, bicuspid aortic valve in the family who presents to establish care.  Patient notes received low dose CT scan for lung cancer screening who incidentally found coronary calcium.  Patient notes that with recent CPET she has had exertional chest pain.  With chest pressure on exertion.  DOE with mild exercise that has progressed and got worse.  No rest pain.  No syncope. Sometimes this radiates to her shoulder.  Improves with rest.  Past Medical History:  Diagnosis Date  . Allergy   . Anxiety   . Arthritis   . Asthma   . Back pain   . Cataract    both eyes  surgically repaired  . COPD (chronic obstructive pulmonary disease) ([place])   . Depression    treated  . Dyspnea   . Fibrocystic breast disease   . GERD (gastroesophageal reflux disease)   . History of hiatal hernia    repaired  . History of kidney stones    questionable  . Hyperlipidemia   . Hypertension    pt denies  . Hypothyroidism   . Irritable bowel syndrome   . Joint pain   . Osteoarthritis   . Ovarian cyst   . Palpitations   . Pancreatitis   . PONV (postoperative nausea and vomiting)    NO TROUBLE WITH CATARACT SURGERY  . Psoriasis   . Psoriatic arthritis ([place]) 05/12/2016   Poor response to Humira Inadequate response to Enbrel Inadequate response to Simponi  . Shortness of breath   . Tachycardia   . Ulcerative colitis     Past Surgical History:  Procedure Laterality Date  . ABDOMINAL HYSTERECTOMY    . ANKLE RECONSTRUCTION     left  . bladder tuck    . CATARACT EXTRACTION  W/PHACO Right 09/11/2017   Procedure: CATARACT EXTRACTION PHACO AND INTRAOCULAR LENS PLACEMENT (IOC);  Surgeon: Birder Robson, MD;  Location: [facility];  Service: Ophthalmology;  Laterality: Right;  [place] 00:18.5 AP% 6.9 CDE 1.29 Fluid Pack Lot # [id number]  . CATARACT EXTRACTION W/PHACO Left 10/02/2017   Procedure: CATARACT EXTRACTION PHACO AND INTRAOCULAR LENS PLACEMENT ([place]);  Surgeon: Birder Robson, MD;  Location: [facility];  Service: Ophthalmology;  Laterality: Left;  [place] 00:25.9 AP% 6.9 CDE 1.80 Fluid Pack Lot # [id number]  . CLOSED REDUCTION NASAL FRACTURE N/A 05/21/2018   Procedure: CLOSED REDUCTION NASAL FRACTURE;  Surgeon: Clyde Canterbury, MD;  Location: [place];  Service: ENT;  Laterality: N/A;  Latex sensitivity  . COLONOSCOPY    . EYE SURGERY     Lasik  . FRACTURE SURGERY    . KNEE SURGERY     left  . LAPAROSCOPIC NISSEN FUNDOPLICATION N/A 8/65/7846   Procedure: LAPAROSCOPIC NISSEN TAKEDOWN AND UPPER ENDOSCOPY;  Surgeon: Johnathan Hausen, MD;  [facility];  Service: General;  Laterality: N/A;  . NASAL SEPTUM SURGERY    . NISSEN FUNDOPLICATION    . PARTIAL HYSTERECTOMY    . POLYPECTOMY    . repair cystocele and rectocele    . thyroid ablation    .  TUBAL LIGATION    . UPPER GASTROINTESTINAL ENDOSCOPY    . UPPER GI ENDOSCOPY  01/12/2017   Procedure: UPPER GI ENDOSCOPY;  Surgeon: Johnathan Hausen, MD;  [facility];  Service: General;;    Current Medications: Current Meds  Medication Sig  . aspirin-acetaminophen-caffeine (EXCEDRIN MIGRAINE) 250-250-65 MG tablet Take 2 tablets by mouth every 8 (eight) hours as needed for headache.   . Calcium Carbonate-Vit D-Min (CALTRATE 600+D PLUS PO) Take 1 tablet by mouth daily.  Marland Kitchen dicyclomine (BENTYL) 20 MG tablet Take 1 tablet (20 mg total) by mouth 2 (two) times daily.  Marland Kitchen estradiol (ESTRACE) 1 MG tablet Take 1 mg by mouth daily.  . fluticasone (FLONASE) 50 MCG/ACT nasal spray Place 1 spray into both nostrils daily.    . Fluticasone-Salmeterol (WIXELA INHUB) 250-50 MCG/DOSE AEPB Inhale 1 puff into the lungs in the morning and at bedtime.  . folic acid (FOLVITE) 1 MG tablet Take 2 mg by mouth daily.  Marland Kitchen ibuprofen (ADVIL,MOTRIN) 200 MG tablet Take 400 mg by mouth every 6 (six) hours as needed for mild pain.   . Insulin Pen Needle (BD PEN NEEDLE NANO 2ND GEN) 32G X 4 MM MISC 1 Package by Does not apply route 2 (two) times daily.  Marland Kitchen levocetirizine (XYZAL) 5 MG tablet Take 5 mg by mouth every evening.  Marland Kitchen levothyroxine (SYNTHROID, LEVOTHROID) 125 MCG tablet Take 125 mcg by mouth every other day.   . levothyroxine (SYNTHROID, LEVOTHROID) 137 MCG tablet Take 137 mcg by mouth every other day.  . methotrexate 50 MG/2ML injection INJECT 0.8 ML (20 MG)SUBCUTANEOUSLY ONE TIME WEEKLY. DISCARD VIAL 28 DAYS AFTER FIRST USE.  . metoprolol succinate (TOPROL-XL) 50 MG 24 hr tablet Take 50 mg by mouth daily. Take with or immediately following a meal.  . montelukast (SINGULAIR) 10 MG tablet Take 10 mg by mouth at bedtime.  . Multiple Vitamins-Minerals (CENTRUM SILVER ULTRA WOMENS PO) Take 1 tablet by mouth daily.  Marland Kitchen omeprazole (PRILOSEC) 20 MG capsule TAKE 1 CAPSULE EVERY DAY BEFORE BREAKFAST  . ondansetron (ZOFRAN) 4 MG tablet Take 1 tablet (4 mg total) by mouth every 8 (eight) hours as needed for nausea or vomiting.  . rosuvastatin (CRESTOR) 20 MG tablet Take 20 mg by mouth daily.  . sertraline (ZOLOFT) 100 MG tablet Take 50 mg by mouth daily.   . Tuberculin-Allergy Syringes 27G X 1/2" 1 ML KIT Inject 1 Syringe into the skin once a week. To be used with weekly methotrexate injections  . VENTOLIN HFA 108 (90 Base) MCG/ACT inhaler Inhale 2 puffs into the lungs every 6 (six) hours as needed for wheezing or shortness of breath.  . Vitamin D, Ergocalciferol, (DRISDOL) 1.25 MG (50000 UNIT) CAPS capsule Take 1 capsule (50,000 Units total) by mouth every 14 (fourteen) days.  Morrie Sheldon 5 MG TABS Take 1 tablet by mouth twice a day as  directed by physician.     Allergies:   Demerol [meperidine], Imuran [azathioprine], Meperidine hcl, Remicade [infliximab], Sulfasalazine, Avelox [moxifloxacin], Doxycycline, Erythromycin, and Latex   Social History   Socioeconomic History  . Marital status: Married    Spouse name: Not on file  . Number of children: Not on file  . Years of education: Not on file  . Highest education level: Not on file  Occupational History  . 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 Occupation: disabled  Tobacco Use  . Smoking status: Former Smoker    Packs/day: 1.00  Years: 37.50    Pack years: 37.50    Types: Cigarettes    Quit date: 07/27/2009    Years since quitting: 10.7  . Smokeless tobacco: Never Used  Vaping Use  . Vaping Use: Never used  Substance and Sexual Activity  . Alcohol use: Yes    Alcohol/week: 0.0 standard drinks    Comment:  occasionally (1x/mo)  . Drug use: No  . Sexual activity: Yes  Other Topics Concern  . Not on file  Social History Narrative  . Not on file   Social Determinants of Health   Financial Resource Strain:   . Difficulty of Paying Living Expenses: Not on file  Food Insecurity:   . Worried About Charity fundraiser in the Last Year: Not on file  . Ran Out of Food in the Last Year: Not on file  Transportation Needs:   . Lack of Transportation (Medical): Not on file  . Lack of Transportation (Non-Medical): Not on file  Physical Activity:   . Days of Exercise per Week: Not on file  . Minutes of Exercise per Session: Not on file  Stress:   . Feeling of Stress : Not on file  Social Connections:   . Frequency of Communication with Friends and Family: Not on file  . Frequency of Social Gatherings with Friends and Family: Not on file  . Attends Religious Services: Not on file  . Active Member of Clubs or Organizations: Not on file  . Attends Archivist Meetings: Not on file  . Marital Status: Not on file      Family History: The patient's family history includes Alcohol abuse in her father; Alcoholism in her sister; Breast cancer in her maternal grandmother and paternal aunt; Breast cancer (age of onset: 71) in her mother; Cancer in her mother; Colon cancer in her mother; Colon polyps in her mother and paternal grandmother; Crohn's disease in her daughter and sister; Depression in her mother; Heart disease in her father and mother; Hyperlipidemia in her father and mother; Hypertension in her father and mother; Irritable bowel syndrome in an other family member; Other in her mother; Stroke in her father. There is no history of Esophageal cancer, Rectal cancer, Stomach cancer, or Pancreatic cancer.  Family history of early heart attacks (mom in 78s) Family history of mechanical heart valve   ROS:   Please see the history of present illness.     All other systems reviewed and are negative.  EKGs/Labs/Other Studies Reviewed:    The following studies were reviewed today:  EKG:  EKG is ordered today.  The ekg ordered today demonstrates SR rate 81 without ST/T changes.  Interpretation  Notes: Patient gave a very good effort. Pulse-oximetry remained 95-98% for the duration of exercise (monitored with headprobe sensor).   ECG: Resting ECG in normal sinus rhythm. HR response appropriate. There were no sustained arrhythmias or ST-T changes. BP response hypertensive at peak exercise.   PFT: Pre-exercise spirometry demonstrates mild restrictive properties. The MVV was normal.   CPX: Exercise Capacity- Exercise testing with gas exchange demonstrates a normal peak VO2 of 17.4 ml/kg/min (94% of the age/gender/weight matched sedentary norms). The RER of 1.13 indicates a maximal effort. When adjusted to the patient's ideal body weight of 145.9 lb (66.2 kg) the peak VO2 is 26.6 ml/kg (ibw)/min (107% of the ibw-adjusted predicted).   Cardiovascular response- The O2pulse (a surrogate for stroke volume)  increased with incremental exercise reaching peak at 13 ml/beat (1118% predicted).  Ventilatory response- The VE/VCO2 slope is normal. The oxygen uptake efficiency slope (OUES) is normal. The VO2 at the ventilatory threshold was normal at 84% of the predicted peak VO2. At peak exercise,  the ventilation reached 53% of the measured MVV and breathing reserve was 47 indicating ventilatory reserve remained. PETCO2 was normal at39 mmHg during exercise.   Conclusion: Exercise testing with gas exchange demonstrates normal functional capacity when compared to matched sedentary norms. There is no clear cardiopulmonary abnormality noted. However, there was a hypertensive response to exercise. With corrections for ideal body weight, body habitus appears to be contributing to exercise intolerance.  Recent Labs: 10/27/2019: TSH 1.940 02/17/2020: Hemoglobin 11.9; Platelets 244 03/16/2020: ALT 21; BUN 19; Creatinine, Ser 0.87; Potassium 4.7; Sodium 139  Recent Lipid Panel    Component Value Date/Time   CHOL 153 05/07/2018 1024   TRIG 90 05/07/2018 1024   HDL 61 05/07/2018 1024   CHOLHDL 2.5 05/07/2018 1024   LDLCALC 75 05/07/2018 1024   04/13/20 HDL 58 LDL 70 Triglycerides 130 A1c 6%  Low Dose CT scan:  LAD and RCA calcium  Physical Exam:    VS:  BP 128/76   Pulse 81   Ht 5' 6" (1.676 m)   Wt 222 lb 6.4 oz (100.9 kg)   SpO2 98%   BMI 35.90 kg/m     Wt Readings from Last 3 Encounters:  05/06/20 222 lb 6.4 oz (100.9 kg)  04/12/20 215 lb (97.5 kg)  04/05/20 215 lb 12.8 oz (97.9 kg)    GEN: Obese, well developed in no acute distress HEENT: Normal NECK: No JVD; No carotid bruits LYMPHATICS: No lymphadenopathy CARDIAC: RRR, no murmurs, rubs, gallops RESPIRATORY:  Clear to auscultation without rales, wheezing or rhonchi  ABDOMEN: Soft, non-tender, non-distended MUSCULOSKELETAL:  No edema; No deformity  SKIN: Warm and dry NEUROLOGIC:  Alert and oriented x 3 PSYCHIATRIC:  Normal affect    ASSESSMENT:    1. Angina pectoris ([place])   2. Aortic atherosclerosis ([place])   3. Tobacco use   4. Essential hypertension   5. Other hyperlipidemia   6. Morbid obesity ([place])    PLAN:    In order of problems listed above:  Angina Chest pain Morbid Obesity HTN HLD Former Tobacco Abuse Aortic atherosclerosis Early history of CAD - with additional risk factor of psoriasis - will start ASA 81 mg, continue crestor, continue metoprolol succinate- will give you - PRN nitro - discussed risks repeat stress testing vs LCP; CCTA would be difficult given present calcification, given CPET OK; and high pretest-probabilty, would recommend Left heart catheterization - Risks and benefits of cardiac catheterization have been discussed with the patient.  These include bleeding, infection, kidney damage, stroke, heart attack, death.  The patient understands these risks and is willing to proceed. - CBC, CMP, and COVID Test  Has tolerated ASA int he past without issue.  Bicuspid Valve Family history - Father; will get screen; no history of dissection; will get echocardiogram for eval.  ~ 1 month follow up after LCP unless new symptoms or abnormal test results warranting change in plan  Would be reasonable for  Virtual Follow up Would be reasonable for  APP Follow up  Medication Adjustments/Labs and Tests Ordered: Current medicines are reviewed at length with the patient today.  Concerns regarding medicines are outlined above.  No orders of the defined types were placed in this encounter.  No orders of the defined types were placed in this encounter.   There are  no Patient Instructions on file for this visit.   Signed, Werner Lean, MD  05/06/2020 9:34 AM    [place]

## 2020-05-06 NOTE — H&P (View-Only) (Signed)
Cardiology Office Note:    Date:  05/06/2020   ID:  [name], DOB [birth year], MRN [id number]  PCP:  Mayra Neer, MD   CC: DOE Consulted for the evaluation of coronary artery calcification at the behest of Mayra Neer, MD   History of Present Illness:    [name] is a 54 y.o. female with a hx of HTN, HLD, Tachycardia, Former tobacco use- quit in 2011, Morbid Obesity, coronary calcium and aortic atherosclerosis, bicuspid aortic valve in the family who presents to establish care.  Patient notes received low dose CT scan for lung cancer screening who incidentally found coronary calcium.  Patient notes that with recent CPET she has had exertional chest pain.  With chest pressure on exertion.  DOE with mild exercise that has progressed and got worse.  No rest pain.  No syncope. Sometimes this radiates to her shoulder.  Improves with rest.  Past Medical History:  Diagnosis Date  . Allergy   . Anxiety   . Arthritis   . Asthma   . Back pain   . Cataract    both eyes  surgically repaired  . COPD (chronic obstructive pulmonary disease) ([place])   . Depression    treated  . Dyspnea   . Fibrocystic breast disease   . GERD (gastroesophageal reflux disease)   . History of hiatal hernia    repaired  . History of kidney stones    questionable  . Hyperlipidemia   . Hypertension    pt denies  . Hypothyroidism   . Irritable bowel syndrome   . Joint pain   . Osteoarthritis   . Ovarian cyst   . Palpitations   . Pancreatitis   . PONV (postoperative nausea and vomiting)    NO TROUBLE WITH CATARACT SURGERY  . Psoriasis   . Psoriatic arthritis ([place]) 05/12/2016   Poor response to Humira Inadequate response to Enbrel Inadequate response to Simponi  . Shortness of breath   . Tachycardia   . Ulcerative colitis     Past Surgical History:  Procedure Laterality Date  . ABDOMINAL HYSTERECTOMY    . ANKLE RECONSTRUCTION     left  . bladder tuck    . CATARACT EXTRACTION  W/PHACO Right 09/11/2017   Procedure: CATARACT EXTRACTION PHACO AND INTRAOCULAR LENS PLACEMENT (IOC);  Surgeon: Birder Robson, MD;  Location: [facility];  Service: Ophthalmology;  Laterality: Right;  [place] 00:18.5 AP% 6.9 CDE 1.29 Fluid Pack Lot # [id number]  . CATARACT EXTRACTION W/PHACO Left 10/02/2017   Procedure: CATARACT EXTRACTION PHACO AND INTRAOCULAR LENS PLACEMENT ([place]);  Surgeon: Birder Robson, MD;  Location: [facility];  Service: Ophthalmology;  Laterality: Left;  [place] 00:25.9 AP% 6.9 CDE 1.80 Fluid Pack Lot # [id number]  . CLOSED REDUCTION NASAL FRACTURE N/A 05/21/2018   Procedure: CLOSED REDUCTION NASAL FRACTURE;  Surgeon: Clyde Canterbury, MD;  Location: [facility];  Service: ENT;  Laterality: N/A;  Latex sensitivity  . COLONOSCOPY    . EYE SURGERY     Lasik  . FRACTURE SURGERY    . KNEE SURGERY     left  . LAPAROSCOPIC NISSEN FUNDOPLICATION N/A 8/65/7846   Procedure: LAPAROSCOPIC NISSEN TAKEDOWN AND UPPER ENDOSCOPY;  Surgeon: Johnathan Hausen, MD;  Location: [facility];  Service: General;  Laterality: N/A;  . NASAL SEPTUM SURGERY    . NISSEN FUNDOPLICATION    . PARTIAL HYSTERECTOMY    . POLYPECTOMY    . repair cystocele and rectocele    . thyroid ablation    .  TUBAL LIGATION    . UPPER GASTROINTESTINAL ENDOSCOPY    . UPPER GI ENDOSCOPY  01/12/2017   Procedure: UPPER GI ENDOSCOPY;  Surgeon: Martin, Matthew, MD;  Location: [facility];  Service: General;;    Current Medications: Current Meds  Medication Sig  . aspirin-acetaminophen-caffeine (EXCEDRIN MIGRAINE) 250-250-65 MG tablet Take 2 tablets by mouth every 8 (eight) hours as needed for headache.   . Calcium Carbonate-Vit D-Min (CALTRATE 600+D PLUS PO) Take 1 tablet by mouth daily.  . dicyclomine (BENTYL) 20 MG tablet Take 1 tablet (20 mg total) by mouth 2 (two) times daily.  . estradiol (ESTRACE) 1 MG tablet Take 1 mg by mouth daily.  . fluticasone (FLONASE) 50 MCG/ACT nasal spray Place 1 spray into both nostrils daily.    . Fluticasone-Salmeterol (WIXELA INHUB) 250-50 MCG/DOSE AEPB Inhale 1 puff into the lungs in the morning and at bedtime.  . folic acid (FOLVITE) 1 MG tablet Take 2 mg by mouth daily.  . ibuprofen (ADVIL,MOTRIN) 200 MG tablet Take 400 mg by mouth every 6 (six) hours as needed for mild pain.   . Insulin Pen Needle (BD PEN NEEDLE NANO 2ND GEN) 32G X 4 MM MISC 1 Package by Does not apply route 2 (two) times daily.  . levocetirizine (XYZAL) 5 MG tablet Take 5 mg by mouth every evening.  . levothyroxine (SYNTHROID, LEVOTHROID) 125 MCG tablet Take 125 mcg by mouth every other day.   . levothyroxine (SYNTHROID, LEVOTHROID) 137 MCG tablet Take 137 mcg by mouth every other day.  . methotrexate 50 MG/2ML injection INJECT 0.8 ML (20 MG)SUBCUTANEOUSLY ONE TIME WEEKLY. DISCARD VIAL 28 DAYS AFTER FIRST USE.  . metoprolol succinate (TOPROL-XL) 50 MG 24 hr tablet Take 50 mg by mouth daily. Take with or immediately following a meal.  . montelukast (SINGULAIR) 10 MG tablet Take 10 mg by mouth at bedtime.  . Multiple Vitamins-Minerals (CENTRUM SILVER ULTRA WOMENS PO) Take 1 tablet by mouth daily.  . omeprazole (PRILOSEC) 20 MG capsule TAKE 1 CAPSULE EVERY DAY BEFORE BREAKFAST  . ondansetron (ZOFRAN) 4 MG tablet Take 1 tablet (4 mg total) by mouth every 8 (eight) hours as needed for nausea or vomiting.  . rosuvastatin (CRESTOR) 20 MG tablet Take 20 mg by mouth daily.  . sertraline (ZOLOFT) 100 MG tablet Take 50 mg by mouth daily.   . Tuberculin-Allergy Syringes 27G X 1/2" 1 ML KIT Inject 1 Syringe into the skin once a week. To be used with weekly methotrexate injections  . VENTOLIN HFA 108 (90 Base) MCG/ACT inhaler Inhale 2 puffs into the lungs every 6 (six) hours as needed for wheezing or shortness of breath.  . Vitamin D, Ergocalciferol, (DRISDOL) 1.25 MG (50000 UNIT) CAPS capsule Take 1 capsule (50,000 Units total) by mouth every 14 (fourteen) days.  . XELJANZ 5 MG TABS Take 1 tablet by mouth twice a day as  directed by physician.     Allergies:   Demerol [meperidine], Imuran [azathioprine], Meperidine hcl, Remicade [infliximab], Sulfasalazine, Avelox [moxifloxacin], Doxycycline, Erythromycin, and Latex   Social History   Socioeconomic History  . Marital status: Married    Spouse name: Not on file  . Number of children: Not on file  . Years of education: Not on file  . Highest education level: Not on file  Occupational History  . 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 Occupation: disabled  Tobacco Use  . Smoking status: Former Smoker    Packs/day: 1.00      Years: 37.50    Pack years: 37.50    Types: Cigarettes    Quit date: 07/27/2009    Years since quitting: 10.7  . Smokeless tobacco: Never Used  Vaping Use  . Vaping Use: Never used  Substance and Sexual Activity  . Alcohol use: Yes    Alcohol/week: 0.0 standard drinks    Comment:  occasionally (1x/mo)  . Drug use: No  . Sexual activity: Yes  Other Topics Concern  . Not on file  Social History Narrative  . Not on file   Social Determinants of Health   Financial Resource Strain:   . Difficulty of Paying Living Expenses: Not on file  Food Insecurity:   . Worried About Running Out of Food in the Last Year: Not on file  . Ran Out of Food in the Last Year: Not on file  Transportation Needs:   . Lack of Transportation (Medical): Not on file  . Lack of Transportation (Non-Medical): Not on file  Physical Activity:   . Days of Exercise per Week: Not on file  . Minutes of Exercise per Session: Not on file  Stress:   . Feeling of Stress : Not on file  Social Connections:   . Frequency of Communication with Friends and Family: Not on file  . Frequency of Social Gatherings with Friends and Family: Not on file  . Attends Religious Services: Not on file  . Active Member of Clubs or Organizations: Not on file  . Attends Club or Organization Meetings: Not on file  . Marital Status: Not on file      Family History: The patient's family history includes Alcohol abuse in her father; Alcoholism in her sister; Breast cancer in her maternal grandmother and paternal aunt; Breast cancer (age of onset: 64) in her mother; Cancer in her mother; Colon cancer in her mother; Colon polyps in her mother and paternal grandmother; Crohn's disease in her daughter and sister; Depression in her mother; Heart disease in her father and mother; Hyperlipidemia in her father and mother; Hypertension in her father and mother; Irritable bowel syndrome in an other family member; Other in her mother; Stroke in her father. There is no history of Esophageal cancer, Rectal cancer, Stomach cancer, or Pancreatic cancer.  Family history of early heart attacks (mom in 40s) Family history of mechanical heart valve   ROS:   Please see the history of present illness.     All other systems reviewed and are negative.  EKGs/Labs/Other Studies Reviewed:    The following studies were reviewed today:  EKG:  EKG is ordered today.  The ekg ordered today demonstrates SR rate 81 without ST/T changes.  Interpretation  Notes: Patient gave a very good effort. Pulse-oximetry remained 95-98% for the duration of exercise (monitored with headprobe sensor).   ECG: Resting ECG in normal sinus rhythm. HR response appropriate. There were no sustained arrhythmias or ST-T changes. BP response hypertensive at peak exercise.   PFT: Pre-exercise spirometry demonstrates mild restrictive properties. The MVV was normal.   CPX: Exercise Capacity- Exercise testing with gas exchange demonstrates a normal peak VO2 of 17.4 ml/kg/min (94% of the age/gender/weight matched sedentary norms). The RER of 1.13 indicates a maximal effort. When adjusted to the patient's ideal body weight of 145.9 lb (66.2 kg) the peak VO2 is 26.6 ml/kg (ibw)/min (107% of the ibw-adjusted predicted).   Cardiovascular response- The O2pulse (a surrogate for stroke volume)  increased with incremental exercise reaching peak at 13 ml/beat (1118% predicted).     Ventilatory response- The VE/VCO2 slope is normal. The oxygen uptake efficiency slope (OUES) is normal. The VO2 at the ventilatory threshold was normal at 84% of the predicted peak VO2. At peak exercise,  the ventilation reached 53% of the measured MVV and breathing reserve was 47 indicating ventilatory reserve remained. PETCO2 was normal at39 mmHg during exercise.   Conclusion: Exercise testing with gas exchange demonstrates normal functional capacity when compared to matched sedentary norms. There is no clear cardiopulmonary abnormality noted. However, there was a hypertensive response to exercise. With corrections for ideal body weight, body habitus appears to be contributing to exercise intolerance.  Recent Labs: 10/27/2019: TSH 1.940 02/17/2020: Hemoglobin 11.9; Platelets 244 03/16/2020: ALT 21; BUN 19; Creatinine, Ser 0.87; Potassium 4.7; Sodium 139  Recent Lipid Panel    Component Value Date/Time   CHOL 153 05/07/2018 1024   TRIG 90 05/07/2018 1024   HDL 61 05/07/2018 1024   CHOLHDL 2.5 05/07/2018 1024   LDLCALC 75 05/07/2018 1024   04/13/20 HDL 58 LDL 70 Triglycerides 130 A1c 6%  Low Dose CT scan:  LAD and RCA calcium  Physical Exam:    VS:  BP 128/76   Pulse 81   Ht 5' 6" (1.676 m)   Wt 222 lb 6.4 oz (100.9 kg)   SpO2 98%   BMI 35.90 kg/m     Wt Readings from Last 3 Encounters:  05/06/20 222 lb 6.4 oz (100.9 kg)  04/12/20 215 lb (97.5 kg)  04/05/20 215 lb 12.8 oz (97.9 kg)    GEN: Obese, well developed in no acute distress HEENT: Normal NECK: No JVD; No carotid bruits LYMPHATICS: No lymphadenopathy CARDIAC: RRR, no murmurs, rubs, gallops RESPIRATORY:  Clear to auscultation without rales, wheezing or rhonchi  ABDOMEN: Soft, non-tender, non-distended MUSCULOSKELETAL:  No edema; No deformity  SKIN: Warm and dry NEUROLOGIC:  Alert and oriented x 3 PSYCHIATRIC:  Normal affect    ASSESSMENT:    1. Angina pectoris ([place])   2. Aortic atherosclerosis ([place])   3. Tobacco use   4. Essential hypertension   5. Other hyperlipidemia   6. Morbid obesity ([place])    PLAN:    In order of problems listed above:  Angina Chest pain Morbid Obesity HTN HLD Former Tobacco Abuse Aortic atherosclerosis Early history of CAD - with additional risk factor of psoriasis - will start ASA 81 mg, continue crestor, continue metoprolol succinate- will give you - PRN nitro - discussed risks repeat stress testing vs LCP; CCTA would be difficult given present calcification, given CPET OK; and high pretest-probabilty, would recommend Left heart catheterization - Risks and benefits of cardiac catheterization have been discussed with the patient.  These include bleeding, infection, kidney damage, stroke, heart attack, death.  The patient understands these risks and is willing to proceed. - CBC, CMP, and COVID Test  Has tolerated ASA int he past without issue.  Bicuspid Valve Family history - Father; will get screen; no history of dissection; will get echocardiogram for eval.  ~ 1 month follow up after LCP unless new symptoms or abnormal test results warranting change in plan  Would be reasonable for  Virtual Follow up Would be reasonable for  APP Follow up  Medication Adjustments/Labs and Tests Ordered: Current medicines are reviewed at length with the patient today.  Concerns regarding medicines are outlined above.  No orders of the defined types were placed in this encounter.  No orders of the defined types were placed in this encounter.   There are  no Patient Instructions on file for this visit.   Signed, Werner Lean, MD  05/06/2020 9:34 AM    [place]

## 2020-05-06 NOTE — Telephone Encounter (Signed)
Spoke with pt, aware of results/recs.  Nothing further needed at this time- will close encounter.

## 2020-05-06 NOTE — Telephone Encounter (Signed)
Please let her know that I reviewed her CPST >> It shows a great effort, overall normal function compared with general population. There did not appear to be a limit on her exercise from obstructive lung disease, which means her COPD is well-treated on our current meds. There was some restriction of the size of each breath she takes. This is most likely due to being overweight. Her blood pressure increased with exercise, but over the cardiac responses were normal. This is all good news >> I want her to continue her current meds, work on slowly and steadily increasing her exercise and conditioning.

## 2020-05-06 NOTE — Patient Instructions (Addendum)
Nitroglycerin sublingual tablets What is this medicine? NITROGLYCERIN (nye troe GLI ser in) is a type of vasodilator. It relaxes blood vessels, increasing the blood and oxygen supply to your heart. This medicine is used to relieve chest pain caused by angina. It is also used to prevent chest pain before activities like climbing stairs, going outdoors in cold weather, or sexual activity. This medicine may be used for other purposes; ask your health care provider or pharmacist if you have questions. COMMON BRAND NAME(S): Nitroquick, Nitrostat, Nitrotab What should I tell my health care provider before I take this medicine? They need to know if you have any of these conditions:  anemia  head injury, recent stroke, or bleeding in the brain  liver disease  previous heart attack  an unusual or allergic reaction to nitroglycerin, other medicines, foods, dyes, or preservatives  pregnant or trying to get pregnant  breast-feeding How should I use this medicine? Take this medicine by mouth as needed. At the first sign of an angina attack (chest pain or tightness) place one tablet under your tongue. You can also take this medicine 5 to 10 minutes before an event likely to produce chest pain. Follow the directions on the prescription label. Let the tablet dissolve under the tongue. Do not swallow whole. Replace the dose if you accidentally swallow it. It will help if your mouth is not dry. Saliva around the tablet will help it to dissolve more quickly. Do not eat or drink, smoke or chew tobacco while a tablet is dissolving. If you are not better within 5 minutes after taking ONE dose of nitroglycerin, call 9-1-1 immediately to seek emergency medical care. Do not take more than 3 nitroglycerin tablets over 15 minutes. If you take this medicine often to relieve symptoms of angina, your doctor or health care professional may provide you with different instructions to manage your symptoms. If symptoms do not  go away after following these instructions, it is important to call 9-1-1 immediately. Do not take more than 3 nitroglycerin tablets over 15 minutes. Talk to your pediatrician regarding the use of this medicine in children. Special care may be needed. Overdosage: If you think you have taken too much of this medicine contact a poison control center or emergency room at once. NOTE: This medicine is only for you. Do not share this medicine with others. What if I miss a dose? This does not apply. This medicine is only used as needed. What may interact with this medicine? Do not take this medicine with any of the following medications:  certain migraine medicines like ergotamine and dihydroergotamine (DHE)  medicines used to treat erectile dysfunction like sildenafil, tadalafil, and vardenafil  riociguat This medicine may also interact with the following medications:  alteplase  aspirin  heparin  medicines for high blood pressure  medicines for mental depression  other medicines used to treat angina  phenothiazines like chlorpromazine, mesoridazine, prochlorperazine, thioridazine This list may not describe all possible interactions. Give your health care provider a list of all the medicines, herbs, non-prescription drugs, or dietary supplements you use. Also tell them if you smoke, drink alcohol, or use illegal drugs. Some items may interact with your medicine. What should I watch for while using this medicine? Tell your doctor or health care professional if you feel your medicine is no longer working. Keep this medicine with you at all times. Sit or lie down when you take your medicine to prevent falling if you feel dizzy or faint after  using it. Try to remain calm. This will help you to feel better faster. If you feel dizzy, take several deep breaths and lie down with your feet propped up, or bend forward with your head resting between your knees. You may get drowsy or dizzy. Do not  drive, use machinery, or do anything that needs mental alertness until you know how this drug affects you. Do not stand or sit up quickly, especially if you are an older patient. This reduces the risk of dizzy or fainting spells. Alcohol can make you more drowsy and dizzy. Avoid alcoholic drinks. Do not treat yourself for coughs, colds, or pain while you are taking this medicine without asking your doctor or health care professional for advice. Some ingredients may increase your blood pressure. What side effects may I notice from receiving this medicine? Side effects that you should report to your doctor or health care professional as soon as possible:  blurred vision  dry mouth  skin rash  sweating  the feeling of extreme pressure in the head  unusually weak or tired Side effects that usually do not require medical attention (report to your doctor or health care professional if they continue or are bothersome):  flushing of the face or neck  headache  irregular heartbeat, palpitations  nausea, vomiting This list may not describe all possible side effects. Call your doctor for medical advice about side effects. You may report side effects to FDA at 1-800-FDA-1088. Where should I keep my medicine? Keep out of the reach of children. Store at room temperature between 20 and 25 degrees C (68 and 77 degrees F). Store in [employer]. Protect from light and moisture. Keep tightly closed. Throw away any unused medicine after the expiration date. NOTE: This sheet is a summary. It may not cover all possible information. If you have questions about this medicine, talk to your doctor, pharmacist, or health care provider.  2020 Elsevier/Gold Standard (2013-05-01 17:57:36)   Your physician has recommended you make the following change in your medication:  Start Aspirin 81 mg by mouth daily. A prescription for NTG has been sent to your pharmacy to use as needed.  *If you need a refill on  your cardiac medications before your next appointment, please call your pharmacy*   Lab Work: Lab work to be done today--CBC and CMP If you have labs (blood work) drawn today and your tests are completely normal, you will receive your results only by: Marland Kitchen MyChart Message (if you have MyChart) OR . A paper copy in the mail If you have any lab test that is abnormal or we need to change your treatment, we will call you to review the results.   Testing/Procedures: Your physician has requested that you have a cardiac catheterization. Cardiac catheterization is used to diagnose and/or treat various heart conditions. Doctors may recommend this procedure for a number of different reasons. The most common reason is to evaluate chest pain. Chest pain can be a symptom of coronary artery disease (CAD), and cardiac catheterization can show whether plaque is narrowing or blocking your heart's arteries. This procedure is also used to evaluate the valves, as well as measure the blood flow and oxygen levels in different parts of your heart. For further information please visit [email or web address]. Please follow instruction sheet, as given.  Scheduled for October 25,2021  Your physician has requested that you have an echocardiogram. Echocardiography is a painless test that uses sound waves to create images of your heart. It provides  your doctor with information about the size and shape of your heart and how well your heart's chambers and valves are working. This procedure takes approximately one hour. There are no restrictions for this procedure.   Follow-Up: At [facility], you and your health needs are our priority.  As part of our continuing mission to provide you with exceptional heart care, we have created designated Provider Care Teams.  These Care Teams include your primary Cardiologist (physician) and Advanced Practice Providers (APPs -  Physician Assistants and Nurse Practitioners) who all work together  to provide you with the care you need, when you need it.  We recommend signing up for the patient portal called "MyChart".  Sign up information is provided on this After Visit Summary.  MyChart is used to connect with patients for Virtual Visits (Telemedicine).  Patients are able to view lab/test results, encounter notes, upcoming appointments, etc.  Non-urgent messages can be sent to your provider as well.   To learn more about what you can do with MyChart, go to [email or web address].    Your next appointment:   1 month  The format for your next appointment:   In Person  Provider:   Rudean Haskell, MD   Other Instructions     [place] OFFICE [address] [place], [place] [address] Dept: [phone] Loc: [place]  05/06/2020  You are scheduled for a Cardiac Catheterization on Monday, October 25 with Dr. Daneen Schick.  1. Please arrive at the [facility] (Main Entrance A) at [facility]: [address] [place], [place] [address] at 5:30 AM (This time is two hours before your procedure to ensure your preparation). Free valet parking service is available.   Special note: Every effort is made to have your procedure done on time. Please understand that emergencies sometimes delay scheduled procedures.  2. Diet: Do not eat solid foods after midnight.  The patient may have clear liquids until 5am upon the day of the procedure.  3. Labs: done in office on 05/06/20 4. Medication instructions in preparation for your procedure:   Contrast Allergy: No   On the morning of your procedure, take your Aspirin and any morning medicines NOT listed above.  You may use sips of water.  5. Plan for one night stay--bring personal belongings. 6. Bring a current list of your medications and current insurance cards. 7. You MUST have a responsible person to drive you  home. 8. Someone MUST be with you the first 24 hours after you arrive home or your discharge will be delayed. 9. Please wear clothes that are easy to get on and off and wear slip-on shoes.  Thank you for allowing [place] to care for you!   -- [facility]  Due to recent COVID-19 restrictions implemented by our local and state authorities and in an effort to keep both patients and staff as safe as possible, our hospital system requires COVID-19 testing prior to certain scheduled hospital procedures.  Please go to [place]. [place], [place] [address] on 10/25 at 10:10  .  This is a drive up testing site.  You will not need to exit your vehicle.  You will not be billed at the time of testing but may receive a bill later depending on your insurance.  The approximate cost of the test is $100.  You must agree to self-quarantine from the time  of your testing until the procedure date on 05/10/20  This should included staying home with ONLY the people you live with.  Avoid take-out, grocery store shopping or leaving the house for any non-emergent reason.  Failure to have your COVID-19 test done on the date and time you have been scheduled will result in cancellation of your procedure.  Please call our office at [phone] if you have any questions.

## 2020-05-08 ENCOUNTER — Other Ambulatory Visit (HOSPITAL_COMMUNITY)
Admission: RE | Admit: 2020-05-08 | Discharge: 2020-05-08 | Disposition: A | Discharge status: Home / Self Care | Payer: Medicare HMO | Source: Ambulatory Visit | Attending: Interventional Cardiology | Admitting: Interventional Cardiology

## 2020-05-08 DIAGNOSIS — Z01812 Encounter for preprocedural laboratory examination: Secondary | ICD-10-CM | POA: Diagnosis not present

## 2020-05-08 DIAGNOSIS — Z20822 Contact with and (suspected) exposure to covid-19: Secondary | ICD-10-CM | POA: Insufficient documentation

## 2020-05-08 LAB — SARS CORONAVIRUS 2 (TAT 6-24 HRS): SARS Coronavirus 2: NEGATIVE

## 2020-05-09 NOTE — H&P (Signed)
Chart reviewed. CP history. Coronary calcium on non-coronary CT Multiple risk factors:DM II, Lipids, fam Hx, inflammatory dz(Psoriasis)

## 2020-05-10 ENCOUNTER — Ambulatory Visit (HOSPITAL_COMMUNITY)
Admission: RE | Admit: 2020-05-10 | Discharge: 2020-05-10 | Disposition: A | Discharge status: Home / Self Care | Payer: Medicare HMO | Attending: Interventional Cardiology | Admitting: Interventional Cardiology

## 2020-05-10 ENCOUNTER — Encounter (HOSPITAL_COMMUNITY): Payer: Self-pay | Admitting: Interventional Cardiology

## 2020-05-10 ENCOUNTER — Ambulatory Visit: Payer: Medicare HMO | Admitting: Orthopedic Surgery

## 2020-05-10 ENCOUNTER — Other Ambulatory Visit: Payer: Self-pay

## 2020-05-10 ENCOUNTER — Encounter (HOSPITAL_COMMUNITY)
Admission: RE | Disposition: A | Discharge status: Home / Self Care | Payer: Self-pay | Source: Home / Self Care | Attending: Interventional Cardiology

## 2020-05-10 DIAGNOSIS — F329 Major depressive disorder, single episode, unspecified: Secondary | ICD-10-CM | POA: Insufficient documentation

## 2020-05-10 DIAGNOSIS — I209 Angina pectoris, unspecified: Secondary | ICD-10-CM | POA: Diagnosis present

## 2020-05-10 DIAGNOSIS — K219 Gastro-esophageal reflux disease without esophagitis: Secondary | ICD-10-CM | POA: Diagnosis not present

## 2020-05-10 DIAGNOSIS — I700 Atherosclerosis of aorta: Secondary | ICD-10-CM | POA: Diagnosis present

## 2020-05-10 DIAGNOSIS — Z881 Allergy status to other antibiotic agents status: Secondary | ICD-10-CM | POA: Diagnosis not present

## 2020-05-10 DIAGNOSIS — K589 Irritable bowel syndrome without diarrhea: Secondary | ICD-10-CM | POA: Insufficient documentation

## 2020-05-10 DIAGNOSIS — Z79899 Other long term (current) drug therapy: Secondary | ICD-10-CM | POA: Insufficient documentation

## 2020-05-10 DIAGNOSIS — R0609 Other forms of dyspnea: Secondary | ICD-10-CM | POA: Diagnosis present

## 2020-05-10 DIAGNOSIS — Z882 Allergy status to sulfonamides status: Secondary | ICD-10-CM | POA: Insufficient documentation

## 2020-05-10 DIAGNOSIS — Z6835 Body mass index (BMI) 35.0-35.9, adult: Secondary | ICD-10-CM | POA: Insufficient documentation

## 2020-05-10 DIAGNOSIS — Z885 Allergy status to narcotic agent status: Secondary | ICD-10-CM | POA: Insufficient documentation

## 2020-05-10 DIAGNOSIS — Z87891 Personal history of nicotine dependence: Secondary | ICD-10-CM | POA: Diagnosis not present

## 2020-05-10 DIAGNOSIS — E785 Hyperlipidemia, unspecified: Secondary | ICD-10-CM | POA: Diagnosis not present

## 2020-05-10 DIAGNOSIS — Z720 Tobacco use: Secondary | ICD-10-CM | POA: Diagnosis present

## 2020-05-10 DIAGNOSIS — F419 Anxiety disorder, unspecified: Secondary | ICD-10-CM | POA: Diagnosis not present

## 2020-05-10 DIAGNOSIS — R0600 Dyspnea, unspecified: Secondary | ICD-10-CM | POA: Diagnosis present

## 2020-05-10 DIAGNOSIS — E039 Hypothyroidism, unspecified: Secondary | ICD-10-CM | POA: Diagnosis not present

## 2020-05-10 DIAGNOSIS — J449 Chronic obstructive pulmonary disease, unspecified: Secondary | ICD-10-CM | POA: Insufficient documentation

## 2020-05-10 DIAGNOSIS — Z79890 Hormone replacement therapy (postmenopausal): Secondary | ICD-10-CM | POA: Diagnosis not present

## 2020-05-10 DIAGNOSIS — R079 Chest pain, unspecified: Secondary | ICD-10-CM

## 2020-05-10 DIAGNOSIS — Z888 Allergy status to other drugs, medicaments and biological substances status: Secondary | ICD-10-CM | POA: Diagnosis not present

## 2020-05-10 DIAGNOSIS — Z91040 Latex allergy status: Secondary | ICD-10-CM | POA: Insufficient documentation

## 2020-05-10 DIAGNOSIS — I25119 Atherosclerotic heart disease of native coronary artery with unspecified angina pectoris: Secondary | ICD-10-CM | POA: Insufficient documentation

## 2020-05-10 DIAGNOSIS — I10 Essential (primary) hypertension: Secondary | ICD-10-CM | POA: Diagnosis not present

## 2020-05-10 DIAGNOSIS — R931 Abnormal findings on diagnostic imaging of heart and coronary circulation: Secondary | ICD-10-CM

## 2020-05-10 HISTORY — PX: LEFT HEART CATH AND CORONARY ANGIOGRAPHY: CATH118249

## 2020-05-10 SURGERY — LEFT HEART CATH AND CORONARY ANGIOGRAPHY
Anesthesia: LOCAL

## 2020-05-10 MED ORDER — HEPARIN (PORCINE) IN NACL 1000-0.9 UT/500ML-% IV SOLN
INTRAVENOUS | Status: AC
  Filled 2020-05-10: qty 1000

## 2020-05-10 MED ORDER — SODIUM CHLORIDE 0.9% FLUSH: 3.0000 mL | INTRAVENOUS | Status: DC | PRN

## 2020-05-10 MED ORDER — HEPARIN SODIUM (PORCINE) 1000 UNIT/ML IJ SOLN
INTRAMUSCULAR | Status: AC
  Filled 2020-05-10: qty 1

## 2020-05-10 MED ORDER — ASPIRIN 81 MG PO CHEW: 81.0000 mg | CHEWABLE_TABLET | Freq: Every day | ORAL | Status: DC

## 2020-05-10 MED ORDER — HYDRALAZINE HCL 20 MG/ML IJ SOLN: 10.0000 mg | INTRAMUSCULAR | Status: DC | PRN

## 2020-05-10 MED ORDER — OXYCODONE HCL 5 MG PO TABS: 5.0000 mg | ORAL_TABLET | ORAL | Status: DC | PRN

## 2020-05-10 MED ORDER — SODIUM CHLORIDE 0.9% FLUSH: 3.0000 mL | Freq: Two times a day (BID) | INTRAVENOUS | Status: DC

## 2020-05-10 MED ORDER — ASPIRIN 81 MG PO CHEW: 81.0000 mg | CHEWABLE_TABLET | ORAL | Status: DC

## 2020-05-10 MED ORDER — VERAPAMIL HCL 2.5 MG/ML IV SOLN
INTRAVENOUS | Status: DC | PRN
  Administered 2020-05-10: 10 mL via INTRA_ARTERIAL

## 2020-05-10 MED ORDER — SODIUM CHLORIDE 0.9 % IV SOLN: 250.0000 mL | INTRAVENOUS | Status: DC | PRN

## 2020-05-10 MED ORDER — VERAPAMIL HCL 2.5 MG/ML IV SOLN
INTRAVENOUS | Status: AC
  Filled 2020-05-10: qty 2

## 2020-05-10 MED ORDER — LIDOCAINE HCL (PF) 1 % IJ SOLN
INTRAMUSCULAR | Status: DC | PRN
  Administered 2020-05-10: 2 mL

## 2020-05-10 MED ORDER — MIDAZOLAM HCL 2 MG/2ML IJ SOLN
INTRAMUSCULAR | Status: DC | PRN
  Administered 2020-05-10 (×2): 1 mg via INTRAVENOUS

## 2020-05-10 MED ORDER — HEPARIN (PORCINE) IN NACL 1000-0.9 UT/500ML-% IV SOLN
INTRAVENOUS | Status: DC | PRN
  Administered 2020-05-10 (×2): 500 mL

## 2020-05-10 MED ORDER — HEPARIN SODIUM (PORCINE) 1000 UNIT/ML IJ SOLN
INTRAMUSCULAR | Status: DC | PRN
  Administered 2020-05-10: 5000 [IU] via INTRAVENOUS

## 2020-05-10 MED ORDER — ACETAMINOPHEN 325 MG PO TABS: 650.0000 mg | ORAL_TABLET | ORAL | Status: DC | PRN

## 2020-05-10 MED ORDER — SODIUM CHLORIDE 0.9 % WEIGHT BASED INFUSION
3.0000 mL/kg/h | INTRAVENOUS | Status: DC
  Administered 2020-05-10: 3 mL/kg/h via INTRAVENOUS

## 2020-05-10 MED ORDER — SODIUM CHLORIDE 0.9 % IV SOLN: INTRAVENOUS | Status: DC

## 2020-05-10 MED ORDER — LABETALOL HCL 5 MG/ML IV SOLN: 10.0000 mg | INTRAVENOUS | Status: DC | PRN

## 2020-05-10 MED ORDER — MIDAZOLAM HCL 2 MG/2ML IJ SOLN
INTRAMUSCULAR | Status: AC
  Filled 2020-05-10: qty 2

## 2020-05-10 MED ORDER — SODIUM CHLORIDE 0.9 % WEIGHT BASED INFUSION: 1.0000 mL/kg/h | INTRAVENOUS | Status: DC

## 2020-05-10 MED ORDER — LIDOCAINE HCL (PF) 1 % IJ SOLN
INTRAMUSCULAR | Status: AC
  Filled 2020-05-10: qty 30

## 2020-05-10 MED ORDER — ONDANSETRON HCL 4 MG/2ML IJ SOLN: 4.0000 mg | Freq: Four times a day (QID) | INTRAMUSCULAR | Status: DC | PRN

## 2020-05-10 MED ORDER — IOHEXOL 350 MG/ML SOLN
INTRAVENOUS | Status: DC | PRN
  Administered 2020-05-10: 70 mL

## 2020-05-10 SURGICAL SUPPLY — 10 items
CATH 5FR JL3.5 JR4 ANG PIG MP (CATHETERS) ×1 IMPLANT
DEVICE RAD COMP TR BAND LRG (VASCULAR PRODUCTS) ×1 IMPLANT
GLIDESHEATH SLEND A-KIT 6F 22G (SHEATH) ×1 IMPLANT
GUIDEWIRE INQWIRE 1.5J.035X260 (WIRE) IMPLANT
INQWIRE 1.5J .035X260CM (WIRE) ×2
KIT HEART LEFT (KITS) ×2 IMPLANT
PACK CARDIAC CATHETERIZATION (CUSTOM PROCEDURE TRAY) ×2 IMPLANT
SHEATH PROBE COVER 6X72 (BAG) ×1 IMPLANT
TRANSDUCER W/STOPCOCK (MISCELLANEOUS) ×2 IMPLANT
TUBING CIL FLEX 10 FLL-RA (TUBING) ×2 IMPLANT

## 2020-05-10 NOTE — Discharge Instructions (Signed)
Radial Site Care  This sheet gives you information about how to care for yourself after your procedure. Your health care provider may also give you more specific instructions. If you have problems or questions, contact your health care provider. What can I expect after the procedure? After the procedure, it is common to have:  Bruising and tenderness at the catheter insertion area. Follow these instructions at home: Medicines  Take over-the-counter and prescription medicines only as told by your health care provider. Insertion site care  Follow instructions from your health care provider about how to take care of your insertion site. Make sure you: ? Wash your hands with soap and water before you change your bandage (dressing). If soap and water are not available, use hand sanitizer. ? Change your dressing as told by your health care provider. ? Leave stitches (sutures), skin glue, or adhesive strips in place. These skin closures may need to stay in place for 2 weeks or longer. If adhesive strip edges start to loosen and curl up, you may trim the loose edges. Do not remove adhesive strips completely unless your health care provider tells you to do that.  Check your insertion site every day for signs of infection. Check for: ? Redness, swelling, or pain. ? Fluid or blood. ? Pus or a bad smell. ? Warmth.  Do not take baths, swim, or use a hot tub until your health care provider approves.  You may shower 24-48 hours after the procedure, or as directed by your health care provider. ? Remove the dressing and gently wash the site with plain soap and water. ? Pat the area dry with a clean towel. ? Do not rub the site. That could cause bleeding.  Do not apply powder or lotion to the site. Activity   For 24 hours after the procedure, or as directed by your health care provider: ? Do not flex or bend the affected arm. ? Do not push or pull heavy objects with the affected arm. ? Do not  drive yourself home from the hospital or clinic. You may drive 24 hours after the procedure unless your health care provider tells you not to. ? Do not operate machinery or power tools.  Do not lift anything that is heavier than 10 lb (4.5 kg), or the limit that you are told, until your health care provider says that it is safe.  Ask your health care provider when it is okay to: ? Return to work or school. ? Resume usual physical activities or sports. ? Resume sexual activity. General instructions  If the catheter site starts to bleed, raise your arm and put firm pressure on the site. If the bleeding does not stop, get help right away. This is a medical emergency.  If you went home on the same day as your procedure, a responsible adult should be with you for the first 24 hours after you arrive home.  Keep all follow-up visits as told by your health care provider. This is important. Contact a health care provider if:  You have a fever.  You have redness, swelling, or yellow drainage around your insertion site. Get help right away if:  You have unusual pain at the radial site.  The catheter insertion area swells very fast.  The insertion area is bleeding, and the bleeding does not stop when you hold steady pressure on the area.  Your arm or hand becomes pale, cool, tingly, or numb. These symptoms may represent a serious problem   that is an emergency. Do not wait to see if the symptoms will go away. Get medical help right away. Call your local emergency services (911 in the [place] Do not drive yourself to the hospital. Summary  After the procedure, it is common to have bruising and tenderness at the site.  Follow instructions from your health care provider about how to take care of your radial site wound. Check the wound every day for signs of infection.  Do not lift anything that is heavier than 10 lb (4.5 kg), or the limit that you are told, until your health care provider says  that it is safe. This information is not intended to replace advice given to you by your health care provider. Make sure you discuss any questions you have with your health care provider. Document Revised: 08/08/2017 Document Reviewed: 08/08/2017 Elsevier Patient Education  2020 Elsevier Inc.  

## 2020-05-10 NOTE — Interval H&P Note (Signed)
Cath Lab Visit (complete for each Cath Lab visit)  Clinical Evaluation Leading to the Procedure:   ACS: No.  Non-ACS:    Anginal Classification: CCS III  Anti-ischemic medical therapy: Minimal Therapy (1 class of medications)  Non-Invasive Test Results: No non-invasive testing performed  Prior CABG: No previous CABG      History and Physical Interval Note:  05/10/2020 8:03 AM  [name]  has presented today for surgery, with the diagnosis of Chest pain.  The various methods of treatment have been discussed with the patient and family. After consideration of risks, benefits and other options for treatment, the patient has consented to  Procedure(s): LEFT HEART CATH AND CORONARY ANGIOGRAPHY (N/A) as a surgical intervention.  The patient's history has been reviewed, patient examined, no change in status, stable for surgery.  I have reviewed the patient's chart and labs.  Questions were answered to the patient's satisfaction.     Belva Crome III

## 2020-05-10 NOTE — CV Procedure (Signed)
   Widely patent coronary arteries.  Normal LV function.  LVEDP 15 mmHg.

## 2020-05-13 ENCOUNTER — Other Ambulatory Visit: Payer: Self-pay | Admitting: Physician Assistant

## 2020-05-13 NOTE — Telephone Encounter (Signed)
Last Visit: 04/05/2020 Next Visit: 09/06/2020 Labs: 05/06/2020 WNL  Current Dose per office note 04/05/2020: MTX 0.8 ml sq once weekly DX: Psoriatic arthritis   Okay to refill per Dr. Estanislado Pandy

## 2020-05-20 ENCOUNTER — Other Ambulatory Visit (INDEPENDENT_AMBULATORY_CARE_PROVIDER_SITE_OTHER): Payer: Self-pay | Admitting: Family Medicine

## 2020-05-20 DIAGNOSIS — E8881 Metabolic syndrome: Secondary | ICD-10-CM

## 2020-05-20 NOTE — Telephone Encounter (Signed)
Last seen by Dr.Ukleja

## 2020-05-25 ENCOUNTER — Other Ambulatory Visit: Payer: Self-pay | Admitting: Physician Assistant

## 2020-05-26 NOTE — Telephone Encounter (Signed)
Last Visit: 04/05/2020 Next Visit: 09/06/2020 Labs: 05/06/2020 WNL  Current Dose per office note 04/05/2020: [name] 5 mg BID DX: Psoriatic arthritis   Okay to refill per Dr. Estanislado Pandy

## 2020-05-27 ENCOUNTER — Other Ambulatory Visit: Payer: Self-pay

## 2020-05-27 ENCOUNTER — Ambulatory Visit (HOSPITAL_COMMUNITY): Payer: Medicare HMO | Attending: Internal Medicine

## 2020-05-27 DIAGNOSIS — I209 Angina pectoris, unspecified: Secondary | ICD-10-CM | POA: Diagnosis not present

## 2020-05-27 DIAGNOSIS — I700 Atherosclerosis of aorta: Secondary | ICD-10-CM | POA: Diagnosis not present

## 2020-05-27 DIAGNOSIS — I10 Essential (primary) hypertension: Secondary | ICD-10-CM | POA: Diagnosis not present

## 2020-05-27 LAB — ECHOCARDIOGRAM COMPLETE
Area-P 1/2: 3.68 cm2
S' Lateral: 2.5 cm

## 2020-06-08 ENCOUNTER — Telehealth: Payer: Self-pay | Admitting: Pharmacist

## 2020-06-08 NOTE — Telephone Encounter (Addendum)
Received renewal application from [employer] for [facility] patient assistance for 2022.  Completed office portion and faxed to [employer]. Sent copy to scan center.  Last labs 05/06/20 WNL. Next due 08/06/20. TB Gold due April 2022.  Patient confirmed that she has completed and mailed her portion of the application to [employer].  Knox Saliva, PharmD, MPH Clinical Pharmacist (Rheumatology and Pulmonology)

## 2020-06-14 ENCOUNTER — Other Ambulatory Visit: Payer: Self-pay

## 2020-06-14 ENCOUNTER — Encounter: Payer: Self-pay | Admitting: Internal Medicine

## 2020-06-14 ENCOUNTER — Ambulatory Visit: Payer: Medicare HMO | Admitting: Internal Medicine

## 2020-06-14 VITALS — BP 120/70 | HR 82 | Ht 66.0 in | Wt 221.0 lb

## 2020-06-14 DIAGNOSIS — E7849 Other hyperlipidemia: Secondary | ICD-10-CM

## 2020-06-14 DIAGNOSIS — I25119 Atherosclerotic heart disease of native coronary artery with unspecified angina pectoris: Secondary | ICD-10-CM | POA: Diagnosis not present

## 2020-06-14 DIAGNOSIS — Z8679 Personal history of other diseases of the circulatory system: Secondary | ICD-10-CM | POA: Diagnosis not present

## 2020-06-14 DIAGNOSIS — L4050 Arthropathic psoriasis, unspecified: Secondary | ICD-10-CM

## 2020-06-14 DIAGNOSIS — Z8279 Family history of other congenital malformations, deformations and chromosomal abnormalities: Secondary | ICD-10-CM

## 2020-06-14 DIAGNOSIS — I700 Atherosclerosis of aorta: Secondary | ICD-10-CM | POA: Diagnosis not present

## 2020-06-14 DIAGNOSIS — I2510 Atherosclerotic heart disease of native coronary artery without angina pectoris: Secondary | ICD-10-CM | POA: Insufficient documentation

## 2020-06-14 NOTE — Patient Instructions (Signed)
Medication Instructions:  Your physician recommends that you continue on your current medications as directed. Please refer to the Current Medication list given to you today.  *If you need a refill on your cardiac medications before your next appointment, please call your pharmacy*   Lab Work: None  If you have labs (blood work) drawn today and your tests are completely normal, you will receive your results only by: Marland Kitchen MyChart Message (if you have MyChart) OR . A paper copy in the mail If you have any lab test that is abnormal or we need to change your treatment, we will call you to review the results.   Testing/Procedures: None  Follow-Up: Follow up with Dr. Gasper Sells on 09/13/20 at 10:00 AM   Other Instructions Your physician discussed the importance of regular exercise and recommended that you start or continue a regular exercise program for good health.

## 2020-06-14 NOTE — Progress Notes (Signed)
Cardiology Office Note:    Date:  06/14/2020   ID:  [name], DOB 1966/05/15, MRN [id number]  PCP:  Mayra Neer, MD   CC: DOE Consulted for the evaluation of coronary artery calcification at the behest of Mayra Neer, MD  History of Present Illness:    [name] is a 54 y.o. female with a hx of HTN, HLD,former tobacco use- quit in 2011, morbid obesity, non-obstructive coronary artery disease, aortic atherosclerosis, bicuspid aortic valve in the family who presented 05/06/20 with angina pectoris.  Only non-obstructive CAD found on 05/10/20 LHC.  Echo performed 05/27/20 without evidence of bicuspid valve.   Patient notes that she has been doing well.  Notes that she hosted her family and did marathon cooking.  Has been aggressively cleaned her house and started an walking exercise program tomorrow.  No chest pain; still noted slight twinge but no radiation.  Hasn't done a real strenuous exercise since.  No PND, no orthopnea.  No palpitations.  No syncope.  Ambulatory BP 118/70  Past Medical History:  Diagnosis Date  . Allergy   . Anxiety   . Arthritis   . Asthma   . Back pain   . Cataract    both eyes  surgically repaired  . COPD (chronic obstructive pulmonary disease) ([place])   . Depression    treated  . Dyspnea   . Fibrocystic breast disease   . GERD (gastroesophageal reflux disease)   . History of hiatal hernia    repaired  . History of kidney stones    questionable  . Hyperlipidemia   . Hypertension    pt denies  . Hypothyroidism   . Irritable bowel syndrome   . Joint pain   . Osteoarthritis   . Ovarian cyst   . Palpitations   . Pancreatitis   . PONV (postoperative nausea and vomiting)    NO TROUBLE WITH CATARACT SURGERY  . Psoriasis   . Psoriatic arthritis ([place]) 05/12/2016   Poor response to Humira Inadequate response to Enbrel Inadequate response to Simponi  . Shortness of breath   . Tachycardia   . Ulcerative colitis     Past Surgical  History:  Procedure Laterality Date  . ABDOMINAL HYSTERECTOMY    . ANKLE RECONSTRUCTION     left  . bladder tuck    . CATARACT EXTRACTION W/PHACO Right 09/11/2017   Procedure: CATARACT EXTRACTION PHACO AND INTRAOCULAR LENS PLACEMENT (IOC);  Surgeon: Birder Robson, MD;  Location: [facility];  Service: Ophthalmology;  Laterality: Right;  [place] 00:18.5 AP% 6.9 CDE 1.29 Fluid Pack Lot # [id number]  . CATARACT EXTRACTION W/PHACO Left 10/02/2017   Procedure: CATARACT EXTRACTION PHACO AND INTRAOCULAR LENS PLACEMENT ([place]);  Surgeon: Birder Robson, MD;  Location: [facility];  Service: Ophthalmology;  Laterality: Left;  [place] 00:25.9 AP% 6.9 CDE 1.80 Fluid Pack Lot # [id number]  . CLOSED REDUCTION NASAL FRACTURE N/A 05/21/2018   Procedure: CLOSED REDUCTION NASAL FRACTURE;  Surgeon: Clyde Canterbury, MD;  Location: [place];  Service: ENT;  Laterality: N/A;  Latex sensitivity  . COLONOSCOPY    . EYE SURGERY     Lasik  . FRACTURE SURGERY    . KNEE SURGERY     left  . LAPAROSCOPIC NISSEN FUNDOPLICATION N/A 4/53/6468   Procedure: LAPAROSCOPIC NISSEN TAKEDOWN AND UPPER ENDOSCOPY;  Surgeon: Johnathan Hausen, MD;  Location: [facility];  Service: General;  Laterality: N/A;  . LEFT HEART CATH AND CORONARY ANGIOGRAPHY N/A 05/10/2020   Procedure: LEFT HEART CATH  AND CORONARY ANGIOGRAPHY;  Surgeon: Belva Crome, MD;  Location: [place] [facility];  Service: Cardiovascular;  Laterality: N/A;  . NASAL SEPTUM SURGERY    . NISSEN FUNDOPLICATION    . PARTIAL HYSTERECTOMY    . POLYPECTOMY    . repair cystocele and rectocele    . thyroid ablation    . TUBAL LIGATION    . UPPER GASTROINTESTINAL ENDOSCOPY    . UPPER GI ENDOSCOPY  01/12/2017   Procedure: UPPER GI ENDOSCOPY;  Surgeon: Johnathan Hausen, MD;  Location: [facility];  Service: General;;   Current Medications: Current Meds  Medication Sig  . aspirin EC 81 MG tablet Take 1 tablet (81 mg total) by mouth daily. Swallow whole.  Marland Kitchen aspirin-acetaminophen-caffeine  (EXCEDRIN MIGRAINE) 250-250-65 MG tablet Take 2 tablets by mouth every 8 (eight) hours as needed for headache.   . clobetasol ointment (TEMOVATE) 0.17 % Apply 1 application topically daily as needed (Psoriasis).  Marland Kitchen dicyclomine (BENTYL) 20 MG tablet Take 1 tablet (20 mg total) by mouth 2 (two) times daily.  Marland Kitchen estradiol (ESTRACE) 1 MG tablet Take 1 mg by mouth daily.  . fluticasone (FLONASE) 50 MCG/ACT nasal spray Place 1 spray into both nostrils daily.   . folic acid (FOLVITE) 1 MG tablet Take 2 mg by mouth daily.  Marland Kitchen ibuprofen (ADVIL,MOTRIN) 200 MG tablet Take 400 mg by mouth every 6 (six) hours as needed for mild pain.   Marland Kitchen levocetirizine (XYZAL) 5 MG tablet Take 5 mg by mouth every evening.  Marland Kitchen levothyroxine (SYNTHROID) 125 MCG tablet Take 125 mcg by mouth every other day. Alternate with 137 mcg  . levothyroxine (SYNTHROID) 137 MCG tablet Take 137 mcg by mouth every other day. Alternate with 125 mcg  . methotrexate 50 MG/2ML injection INJECT 0.8 ML (20 MG)SUBCUTANEOUSLY ONE TIME WEEKLY. DISCARD VIAL 28 DAYS AFTER FIRST USE  . metoprolol succinate (TOPROL-XL) 50 MG 24 hr tablet Take 25 mg by mouth daily. Take with or immediately following a meal.   . montelukast (SINGULAIR) 10 MG tablet Take 10 mg by mouth at bedtime.  . nitroGLYCERIN (NITROSTAT) 0.4 MG SL tablet Place 1 tablet (0.4 mg total) under the tongue every 5 (five) minutes as needed for chest pain.  Marland Kitchen omeprazole (PRILOSEC) 20 MG capsule TAKE 1 CAPSULE EVERY DAY BEFORE BREAKFAST  . ondansetron (ZOFRAN) 4 MG tablet Take 1 tablet (4 mg total) by mouth every 8 (eight) hours as needed for nausea or vomiting.  . rosuvastatin (CRESTOR) 20 MG tablet Take 20 mg by mouth daily.  . Tuberculin-Allergy Syringes 27G X 1/2" 1 ML KIT Inject 1 Syringe into the skin once a week. To be used with weekly methotrexate injections  . VENTOLIN HFA 108 (90 Base) MCG/ACT inhaler Inhale 2 puffs into the lungs every 6 (six) hours as needed for wheezing or shortness of  breath.  . Vitamin D, Ergocalciferol, (DRISDOL) 1.25 MG (50000 UNIT) CAPS capsule Take 1 capsule (50,000 Units total) by mouth every 14 (fourteen) days.  Morrie Sheldon 5 MG TABS Take 1 tablet by mouth twice a day as directed by physician.     Allergies:   Demerol [meperidine], Imuran [azathioprine], Meperidine hcl, Remicade [infliximab], Sulfasalazine, Avelox [moxifloxacin], Doxycycline, Erythromycin, and Latex   Social History   Socioeconomic History  . Marital status: Married    Spouse name: Not on file  . Number of children: Not on file  . Years of education: Not on file  . Highest education level: Not on file  Occupational  History  . 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 Occupation: disabled  Tobacco Use  . Smoking status: Former Smoker    Packs/day: 1.00    Years: 37.50    Pack years: 37.50    Types: Cigarettes    Quit date: 07/27/2009    Years since quitting: 10.8  . Smokeless tobacco: Never Used  Vaping Use  . Vaping Use: Never used  Substance and Sexual Activity  . Alcohol use: Yes    Alcohol/week: 0.0 standard drinks    Comment:  occasionally (1x/mo)  . Drug use: No  . Sexual activity: Yes  Other Topics Concern  . Not on file  Social History Narrative  . Not on file   Social Determinants of Health   Financial Resource Strain:   . Difficulty of Paying Living Expenses: Not on file  Food Insecurity:   . Worried About Charity fundraiser in the Last Year: Not on file  . Ran Out of Food in the Last Year: Not on file  Transportation Needs:   . Lack of Transportation (Medical): Not on file  . Lack of Transportation (Non-Medical): Not on file  Physical Activity:   . Days of Exercise per Week: Not on file  . Minutes of Exercise per Session: Not on file  Stress:   . Feeling of Stress : Not on file  Social Connections:   . Frequency of Communication with Friends and Family: Not on file  . Frequency of Social Gatherings with Friends  and Family: Not on file  . Attends Religious Services: Not on file  . Active Member of Clubs or Organizations: Not on file  . Attends Archivist Meetings: Not on file  . Marital Status: Not on file    Family History: The patient's family history includes Alcohol abuse in her father; Alcoholism in her sister; Breast cancer in her maternal grandmother and paternal aunt; Breast cancer (age of onset: 39) in her mother; Cancer in her mother; Colon cancer in her mother; Colon polyps in her mother and paternal grandmother; Crohn's disease in her daughter and sister; Depression in her mother; Heart disease in her father and mother; Hyperlipidemia in her father and mother; Hypertension in her father and mother; Irritable bowel syndrome in an other family member; Other in her mother; Stroke in her father. There is no history of Esophageal cancer, Rectal cancer, Stomach cancer, or Pancreatic cancer.  Family history of early heart attacks (mom in 12s) Family history of mechanical heart valve after bicuspid valve  ROS:   Please see the history of present illness.     All other systems reviewed and are negative.  EKGs/Labs/Other Studies Reviewed:    The following studies were reviewed today:  EKG:   05/06/20 EKG SR rate 81 without ST/T changes.  Interpretation  Notes: Patient gave a very good effort. Pulse-oximetry remained 95-98% for the duration of exercise (monitored with headprobe sensor).   ECG: Resting ECG in normal sinus rhythm. HR response appropriate. There were no sustained arrhythmias or ST-T changes. BP response hypertensive at peak exercise.   PFT: Pre-exercise spirometry demonstrates mild restrictive properties. The MVV was normal.   CPX: Exercise Capacity- Exercise testing with gas exchange demonstrates a normal peak VO2 of 17.4 ml/kg/min (94% of the age/gender/weight matched sedentary norms). The RER of 1.13 indicates a maximal effort. When adjusted to the patient's ideal  body weight of 145.9 lb (66.2 kg) the peak VO2 is 26.6  ml/kg (ibw)/min (107% of the ibw-adjusted predicted).   Cardiovascular response- The O2pulse (a surrogate for stroke volume) increased with incremental exercise reaching peak at 13 ml/beat (1118% predicted).   Ventilatory response- The VE/VCO2 slope is normal. The oxygen uptake efficiency slope (OUES) is normal. The VO2 at the ventilatory threshold was normal at 84% of the predicted peak VO2. At peak exercise,  the ventilation reached 53% of the measured MVV and breathing reserve was 47 indicating ventilatory reserve remained. PETCO2 was normal at39 mmHg during exercise.   Conclusion: Exercise testing with gas exchange demonstrates normal functional capacity when compared to matched sedentary norms. There is no clear cardiopulmonary abnormality noted. However, there was a hypertensive response to exercise. With corrections for ideal body weight, body habitus appears to be contributing to exercise intolerance.  05/27/20 Echo IMPRESSIONS    1. Left ventricular ejection fraction, by estimation, is 60 to 65%. The  left ventricle has normal function. The left ventricle has no regional  wall motion abnormalities. Left ventricular diastolic parameters were  normal.  2. Right ventricular systolic function is normal. The right ventricular  size is normal. Tricuspid regurgitation signal is inadequate for assessing  PA pressure.  3. The mitral valve is normal in structure. No evidence of mitral valve  regurgitation. No evidence of mitral stenosis.  4. The aortic valve is tricuspid. Aortic valve regurgitation is not  visualized. No aortic stenosis is present.  5. The inferior vena cava is normal in size with greater than 50%  respiratory variability, suggesting right atrial pressure of 3 mmHg.   Conclusion(s)/Recommendation(s): Normal biventricular function without  evidence of hemodynamically significant valvular heart disease.    05/10/20 LHC  Normal left ventricular function with LVEDP 15%.  Estimated ejection fraction 55%.  Widely patent left main.  10 to 20% ostial LAD.  10 to 20% ostial circumflex.  Dominant normal RCA.   Recent Labs: 10/27/2019: TSH 1.940 05/06/2020: ALT 14; BUN 16; Creatinine, Ser 0.81; Hemoglobin 13.4; Platelets 295; Potassium 4.4; Sodium 138  Recent Lipid Panel    Component Value Date/Time   CHOL 153 05/07/2018 1024   TRIG 90 05/07/2018 1024   HDL 61 05/07/2018 1024   CHOLHDL 2.5 05/07/2018 1024   LDLCALC 75 05/07/2018 1024   04/13/20 HDL 58 LDL 70 Triglycerides 130 A1c 6%  Physical Exam:    VS:  BP 120/70   Pulse 82   Ht 5' 6"  (1.676 m)   Wt 221 lb (100.2 kg)   SpO2 98%   BMI 35.67 kg/m     Wt Readings from Last 3 Encounters:  06/14/20 221 lb (100.2 kg)  05/10/20 222 lb (100.7 kg)  05/06/20 222 lb 6.4 oz (100.9 kg)    GEN: Obese, well developed in no acute distress HEENT: Normal NECK: No JVD; No carotid bruits LYMPHATICS: No lymphadenopathy CARDIAC: RRR, no murmurs, rubs, gallops, R radial cath site c/d/i no bruit RESPIRATORY:  Clear to auscultation without rales, wheezing or rhonchi  ABDOMEN: Soft, non-tender, non-distended MUSCULOSKELETAL:  No edema; No deformity  SKIN: Warm and dry NEUROLOGIC:  Alert and oriented x 3 PSYCHIATRIC:  Normal affect   ASSESSMENT:    1. Coronary artery disease involving native coronary artery of native heart with angina pectoris ([place])   2. Morbid obesity ([place])   3. Other hyperlipidemia   4. Aortic atherosclerosis ([place])   5. Family history of bicuspid aortic valve   6. History of hypertension   7. Psoriatic arthritis ([place])    PLAN:  In order of problems listed above:  Non-obstructive CAD Morbid Obesity HTN HLD Former Tobacco Abuse Aortic atherosclerosis Psoriasis - asymptomatic but sedentary  - anatomy: mild nonobstructive disease - continue ASA 81 mg;  - continue statin, goal LDL 70 - continue BB -  at next visit consider stress CMR for microvascular disease or PET MPI is still no imrprovement with chest discomfort with exercise  Bicuspid Valve Family history - tricuspid aortic valve  3-4 month follow up unless new symptoms or abnormal test results warranting change in plan  Would be reasonable for  Virtual Follow up Would be reasonable for  APP Follow up   Medication Adjustments/Labs and Tests Ordered: Current medicines are reviewed at length with the patient today.  Concerns regarding medicines are outlined above.  No orders of the defined types were placed in this encounter.  No orders of the defined types were placed in this encounter.   There are no Patient Instructions on file for this visit.   Signed, Werner Lean, MD  06/14/2020 9:07 AM    [place]

## 2020-07-02 DIAGNOSIS — Z961 Presence of intraocular lens: Secondary | ICD-10-CM | POA: Diagnosis not present

## 2020-07-15 ENCOUNTER — Other Ambulatory Visit: Payer: Self-pay | Admitting: Family Medicine

## 2020-07-15 DIAGNOSIS — Z1231 Encounter for screening mammogram for malignant neoplasm of breast: Secondary | ICD-10-CM

## 2020-07-20 NOTE — Telephone Encounter (Signed)
Received a fax from  [facility] regarding an approval for [facility] patient assistance from 07/13/20 to 07/16/21.   Phone number: [phone] [employer] Pt ID: [id number]  Knox Saliva, PharmD, MPH Clinical Pharmacist (Rheumatology and Pulmonology)

## 2020-07-23 ENCOUNTER — Other Ambulatory Visit: Payer: Self-pay | Admitting: *Deleted

## 2020-07-23 ENCOUNTER — Telehealth: Payer: Self-pay | Admitting: Rheumatology

## 2020-07-23 DIAGNOSIS — Z79899 Other long term (current) drug therapy: Secondary | ICD-10-CM

## 2020-07-23 NOTE — Telephone Encounter (Signed)
Lab Orders released.  

## 2020-07-23 NOTE — Telephone Encounter (Signed)
Patient request orders to be released to [facility] for lab draw.

## 2020-07-24 LAB — CBC WITH DIFFERENTIAL/PLATELET
Basophils Absolute: 0.1 10*3/uL (ref 0.0–0.2)
Basos: 1 %
EOS (ABSOLUTE): 0.1 10*3/uL (ref 0.0–0.4)
Eos: 1 %
Hematocrit: 37.8 % (ref 34.0–46.6)
Hemoglobin: 12.6 g/dL (ref 11.1–15.9)
Immature Grans (Abs): 0 10*3/uL (ref 0.0–0.1)
Immature Granulocytes: 0 %
Lymphocytes Absolute: 3.1 10*3/uL (ref 0.7–3.1)
Lymphs: 47 %
MCH: 31.9 pg (ref 26.6–33.0)
MCHC: 33.3 g/dL (ref 31.5–35.7)
MCV: 96 fL (ref 79–97)
Monocytes Absolute: 0.6 10*3/uL (ref 0.1–0.9)
Monocytes: 9 %
Neutrophils Absolute: 2.7 10*3/uL (ref 1.4–7.0)
Neutrophils: 42 %
Platelets: 274 10*3/uL (ref 150–450)
RBC: 3.95 x10E6/uL (ref 3.77–5.28)
RDW: 12.6 % (ref 11.7–15.4)
WBC: 6.5 10*3/uL (ref 3.4–10.8)

## 2020-07-24 LAB — CMP14+EGFR
ALT: 18 IU/L (ref 0–32)
AST: 25 IU/L (ref 0–40)
Albumin/Globulin Ratio: 1.5 (ref 1.2–2.2)
Albumin: 4.3 g/dL (ref 3.8–4.9)
Alkaline Phosphatase: 55 IU/L (ref 44–121)
BUN/Creatinine Ratio: 17 (ref 9–23)
BUN: 19 mg/dL (ref 6–24)
Bilirubin Total: 0.4 mg/dL (ref 0.0–1.2)
CO2: 25 mmol/L (ref 20–29)
Calcium: 9.6 mg/dL (ref 8.7–10.2)
Chloride: 100 mmol/L (ref 96–106)
Creatinine, Ser: 1.15 mg/dL — ABNORMAL HIGH (ref 0.57–1.00)
GFR calc Af Amer: 62 mL/min/{1.73_m2} (ref >59)
GFR calc non Af Amer: 54 mL/min/{1.73_m2} — ABNORMAL LOW (ref >59)
Globulin, Total: 2.9 g/dL (ref 1.5–4.5)
Glucose: 87 mg/dL (ref 65–99)
Potassium: 4.5 mmol/L (ref 3.5–5.2)
Sodium: 139 mmol/L (ref 134–144)
Total Protein: 7.2 g/dL (ref 6.0–8.5)

## 2020-07-26 ENCOUNTER — Other Ambulatory Visit: Payer: Self-pay | Admitting: Physician Assistant

## 2020-07-26 NOTE — Telephone Encounter (Signed)
Please clarify if the patient has been taking NSAIDs prior to refilling this dose of MTX. Creatinine was elevated and GFR low on 07/23/20.

## 2020-07-26 NOTE — Telephone Encounter (Signed)
Last Visit: 04/05/2020 Next Visit: 09/07/2019 Labs:  07/23/2020, Creatinine 1.15, GFR 54 Current Dose per office note 9/20/201, MTX 0.8 ml sq once weekly DX:  Psoriatic arthritis   Okay to refill MTX?

## 2020-07-26 NOTE — Progress Notes (Signed)
Please advise the patient to reduce MTX to 0.6 ml sq injections once weekly and repeat CMP in 2 weeks.

## 2020-07-26 NOTE — Progress Notes (Signed)
CBC WNL.  Creatinine is elevated-1.15 and GFR is low-54.  Please clarify if the patient has been taking any NSAIDs? Ibuprofen is on her medication list. Please advise her to avoid NSAID use.    Depending on her answer we may have to discuss reducing the dose of MTX.

## 2020-08-10 ENCOUNTER — Telehealth: Payer: Self-pay | Admitting: Rheumatology

## 2020-08-10 DIAGNOSIS — Z79899 Other long term (current) drug therapy: Secondary | ICD-10-CM

## 2020-08-10 NOTE — Telephone Encounter (Signed)
Patient going to [facility] this pm. Please release orders.

## 2020-08-10 NOTE — Telephone Encounter (Signed)
Labs orders released.

## 2020-08-11 LAB — CMP14+EGFR
ALT: 19 IU/L (ref 0–32)
AST: 19 IU/L (ref 0–40)
Albumin/Globulin Ratio: 1.3 (ref 1.2–2.2)
Albumin: 4.1 g/dL (ref 3.8–4.9)
Alkaline Phosphatase: 61 IU/L (ref 44–121)
BUN/Creatinine Ratio: 16 (ref 9–23)
BUN: 14 mg/dL (ref 6–24)
Bilirubin Total: 0.3 mg/dL (ref 0.0–1.2)
CO2: 25 mmol/L (ref 20–29)
Calcium: 9.6 mg/dL (ref 8.7–10.2)
Chloride: 102 mmol/L (ref 96–106)
Creatinine, Ser: 0.88 mg/dL (ref 0.57–1.00)
GFR calc Af Amer: 86 mL/min/{1.73_m2} (ref >59)
GFR calc non Af Amer: 75 mL/min/{1.73_m2} (ref >59)
Globulin, Total: 3.2 g/dL (ref 1.5–4.5)
Glucose: 86 mg/dL (ref 65–99)
Potassium: 4.6 mmol/L (ref 3.5–5.2)
Sodium: 142 mmol/L (ref 134–144)
Total Protein: 7.3 g/dL (ref 6.0–8.5)

## 2020-08-11 NOTE — Telephone Encounter (Signed)
CMP WNL

## 2020-08-18 ENCOUNTER — Ambulatory Visit: Payer: Medicare HMO | Admitting: Obstetrics and Gynecology

## 2020-08-23 NOTE — Progress Notes (Signed)
Office Visit Note  Patient: [name]             Date of Birth: 03-11-1966           MRN: [id number]             PCP: Mayra Neer, MD Referring: Mayra Neer, MD Visit Date: 09/06/2020 Occupation: @GUAROCC @  Subjective:  Medication management.   History of Present Illness: [name] is a 55 y.o. female with history of psoriatic arthritis, psoriasis, ulcerative colitis and osteoarthritis.  She states she is doing quite well on the combination of methotrexate and Xeljanz.  She has been tolerating medications well.  She denies having any joint swelling.  She continues to have some discomfort in her knee joints due to underlying osteoarthritis.  She has occasional discomfort in her left Achilles tendon.  She also reports some discomfort over the left SI joint.  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  She states she ran out of clobetasol.  Activities of Daily Living:  Patient reports morning stiffness for 30-45 minutes.   Patient Denies nocturnal pain.  Difficulty dressing/grooming: Denies Difficulty climbing stairs: Reports Difficulty getting out of chair: Reports Difficulty using hands for taps, buttons, cutlery, and/or writing: Reports  Review of Systems  Constitutional: Positive for fatigue. Negative for night sweats, weight gain and weight loss.  HENT: Negative for mouth sores, trouble swallowing, trouble swallowing, mouth dryness and nose dryness.   Eyes: Negative for pain, redness, itching, visual disturbance and dryness.  Respiratory: Negative for cough, shortness of breath and difficulty breathing.   Cardiovascular: Negative for chest pain, palpitations, hypertension, irregular heartbeat and swelling in legs/feet.  Gastrointestinal: Negative for blood in stool, constipation and diarrhea.  Endocrine: Negative for increased urination.  Genitourinary: Negative for difficulty urinating and vaginal dryness.   Musculoskeletal: Positive for arthralgias, joint pain, myalgias, morning stiffness, muscle tenderness and myalgias. Negative for joint swelling and muscle weakness.  Skin: Positive for rash. Negative for color change, hair loss, redness, skin tightness, ulcers and sensitivity to sunlight.  Allergic/Immunologic: Negative for susceptible to infections.  Neurological: Negative for dizziness, numbness, headaches, memory loss, night sweats and weakness.  Hematological: Negative for bruising/bleeding tendency and swollen glands.  Psychiatric/Behavioral: Negative for depressed mood, confusion and sleep disturbance. The patient is not nervous/anxious.     PMFS History:  Patient Active Problem List   Diagnosis Date Noted  . Coronary artery disease involving native coronary artery of native heart with angina pectoris ([place]) 06/14/2020  . Aortic atherosclerosis ([place]) 05/06/2020  . Angina pectoris ([place]) 05/06/2020  . Family history of bicuspid aortic valve 05/06/2020  . Dyspnea on exertion 03/31/2020  . Prediabetes 01/13/2020  . Vitamin D deficiency 12/24/2019  . Depression 12/24/2019  . Morbid obesity ([place]) 12/24/2019  . Tobacco use 08/06/2019  . Allergic rhinitis 04/19/2018  . Obstructive sleep apnea syndrome, mild 04/19/2018  . COPD (chronic obstructive pulmonary disease) ([place]) 03/26/2018  . Rectal bleeding 01/22/2018  . LLQ abdominal pain 01/22/2018  . Dysphagia 01/12/2017  . 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 Primary osteoarthritis of both knees 08/11/2016  . History of hypertension 08/11/2016  . History of ulcerative colitis 08/11/2016  . History of IBS 08/11/2016  . 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 High risk medications (not anticoagulants) long-term use 05/13/2016  . Knee pain, bilateral 05/13/2016  . Anserine bursitis 05/13/2016  . 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  Chondromalacia patellae, right knee 05/13/2016  . Psoriatic  arthritis ([place]) 05/12/2016  . UNSPECIFIED HYPOTHYROIDISM 08/24/2009  . Other hyperlipidemia 08/24/2009  . Essential hypertension 08/24/2009  . GERD 08/24/2009  . Ulcerative colitis ([place]) 08/24/2009  . IRRITABLE BOWEL SYNDROME 08/24/2009  . OVARIAN CYST 08/24/2009  . PSORIASIS 08/24/2009  . ARTHRITIS 08/24/2009  . TACHYCARDIA, HX OF 08/24/2009  . PANCREATITIS, HX OF 08/24/2009  . FIBROCYSTIC BREAST DISEASE, HX OF 08/24/2009    Past Medical History:  Diagnosis Date  . Allergy   . Anxiety   . Arthritis   . Asthma   . Back pain   . Cataract    both eyes  surgically repaired  . COPD (chronic obstructive pulmonary disease) ([place])   . Depression    treated  . Dyspnea   . Fibrocystic breast disease   . GERD (gastroesophageal reflux disease)   . History of hiatal hernia    repaired  . History of kidney stones    questionable  . Hyperlipidemia   . Hypertension    pt denies  . Hypothyroidism   . Irritable bowel syndrome   . Joint pain   . Osteoarthritis   . Ovarian cyst   . Palpitations   . Pancreatitis   . PONV (postoperative nausea and vomiting)    NO TROUBLE WITH CATARACT SURGERY  . Psoriasis   . Psoriatic arthritis ([place]) 05/12/2016   Poor response to Humira Inadequate response to Enbrel Inadequate response to Simponi  . Shortness of breath   . Tachycardia   . Ulcerative colitis     Family History  Problem Relation Age of Onset  . Breast cancer Mother 37  . Heart disease Mother   . Colon polyps Mother   . Other Mother        pre-cancerous tumor removed  . Colon cancer Mother   . Hypertension Mother   . Hyperlipidemia Mother   . Cancer Mother   . Depression Mother   . Heart disease Father   . Hypertension Father   . Hyperlipidemia Father   . Stroke Father   . Alcohol abuse Father   . Crohn's disease Sister   . Alcoholism Sister   . Irritable bowel syndrome Other   . Crohn's disease Daughter   . Breast cancer Paternal Aunt   . Breast cancer Maternal  Grandmother   . Colon polyps Paternal Grandmother   . Esophageal cancer Neg Hx   . Rectal cancer Neg Hx   . Stomach cancer Neg Hx   . Pancreatic cancer Neg Hx    Past Surgical History:  Procedure Laterality Date  . ABDOMINAL HYSTERECTOMY    . ANKLE RECONSTRUCTION     left  . bladder tuck    . CATARACT EXTRACTION W/PHACO Right 09/11/2017   Procedure: CATARACT EXTRACTION PHACO AND INTRAOCULAR LENS PLACEMENT (IOC);  Surgeon: Birder Robson, MD;  Location: [facility];  Service: Ophthalmology;  Laterality: Right;  [place] 00:18.5 AP% 6.9 CDE 1.29 Fluid Pack Lot # [id number]  . CATARACT EXTRACTION W/PHACO Left 10/02/2017   Procedure: CATARACT EXTRACTION PHACO AND INTRAOCULAR LENS PLACEMENT ([place]);  Surgeon: Birder Robson, MD;  Location: [facility];  Service: Ophthalmology;  Laterality: Left;  [place] 00:25.9 AP% 6.9 CDE 1.80 Fluid Pack Lot # [id number]  . CLOSED REDUCTION NASAL FRACTURE N/A 05/21/2018   Procedure: CLOSED REDUCTION NASAL FRACTURE;  Surgeon: Clyde Canterbury, MD;  Location: [facility];  Service: ENT;  Laterality: N/A;  Latex sensitivity  . COLONOSCOPY    . EYE  SURGERY     Lasik  . FRACTURE SURGERY    . KNEE SURGERY     left  . LAPAROSCOPIC NISSEN FUNDOPLICATION N/A 2/70/3500   Procedure: LAPAROSCOPIC NISSEN TAKEDOWN AND UPPER ENDOSCOPY;  Surgeon: Johnathan Hausen, MD;  Location: [facility];  Service: General;  Laterality: N/A;  . LEFT HEART CATH AND CORONARY ANGIOGRAPHY N/A 05/10/2020   Procedure: LEFT HEART CATH AND CORONARY ANGIOGRAPHY;  Surgeon: Belva Crome, MD;  Location: [facility];  Service: Cardiovascular;  Laterality: N/A;  . NASAL SEPTUM SURGERY    . NISSEN FUNDOPLICATION    . PARTIAL HYSTERECTOMY    . POLYPECTOMY    . repair cystocele and rectocele    . thyroid ablation    . TUBAL LIGATION    . UPPER GASTROINTESTINAL ENDOSCOPY    . UPPER GI ENDOSCOPY  01/12/2017   Procedure: UPPER GI ENDOSCOPY;  Surgeon: Johnathan Hausen, MD;  Location: [facility];  Service:  General;;   Social History   Social History Narrative  . Not on file   Immunization History  Administered Date(s) Administered  . Influenza Inj Mdck Quad Pf 05/08/2017  . Influenza,inj,Quad PF,6+ Mos 03/08/2018, 03/29/2019, 06/25/2019  . Influenza-Unspecified 03/31/2020  . PFIZER(Purple Top)SARS-COV-2 Vaccination 09/28/2019, 10/20/2019, 03/02/2020     Objective: Vital Signs: BP 129/77 (BP Location: Left Arm, Patient Position: Sitting, Cuff Size: Normal)   Pulse 71   Resp 16   Ht 5' 6"  (1.676 m)   Wt 224 lb (101.6 kg)   BMI 36.15 kg/m    Physical Exam Vitals and nursing note reviewed.  Constitutional:      Appearance: She is well-developed and well-nourished.  HENT:     Head: Normocephalic and atraumatic.  Eyes:     Extraocular Movements: EOM normal.     Conjunctiva/sclera: Conjunctivae normal.  Cardiovascular:     Rate and Rhythm: Normal rate and regular rhythm.     Pulses: Intact distal pulses.     Heart sounds: Normal heart sounds.  Pulmonary:     Effort: Pulmonary effort is normal.     Breath sounds: Normal breath sounds.  Abdominal:     General: Bowel sounds are normal.     Palpations: Abdomen is soft.  Musculoskeletal:     Cervical back: Normal range of motion.  Lymphadenopathy:     Cervical: No cervical adenopathy.  Skin:    General: Skin is warm and dry.     Capillary Refill: Capillary refill takes less than 2 seconds.     Comments: Pustular  psoriasis was noted on the plantar surface of her feet.  Neurological:     Mental Status: She is alert and oriented to person, place, and time.  Psychiatric:        Mood and Affect: Mood and affect normal.        Behavior: Behavior normal.      Musculoskeletal Exam: C-spine thoracic and lumbar spine were in good range of motion.  She had mild tenderness over the left SI joint.  Shoulder joints, elbow joints, wrist joints, MCPs PIPs and DIPs with good range of motion with no synovitis.  Hip joints, knee joints,  ankles with good range of motion.  She had no tenderness over MTPs.  She had no tenderness over left Achilles tendon.  CDAI Exam: CDAI Score: - Patient Global: -; Provider Global: - Swollen: -; Tender: - Joint Exam 09/06/2020   No joint exam has been documented for this visit   There is currently no  information documented on the homunculus. Go to the Rheumatology activity and complete the homunculus joint exam.  Investigation: No additional findings.  Imaging: MM 3D SCREEN BREAST BILATERAL  Result Date: 08/27/2020 CLINICAL DATA:  Screening. EXAM: DIGITAL SCREENING BILATERAL MAMMOGRAM WITH TOMOSYNTHESIS AND CAD TECHNIQUE: Bilateral screening digital craniocaudal and mediolateral oblique mammograms were obtained. Bilateral screening digital breast tomosynthesis was performed. The images were evaluated with computer-aided detection. COMPARISON:  Previous exam(s). ACR Breast Density Category b: There are scattered areas of fibroglandular density. FINDINGS: There are no findings suspicious for malignancy. IMPRESSION: No mammographic evidence of malignancy. A result letter of this screening mammogram will be mailed directly to the patient. RECOMMENDATION: Screening mammogram in one year. (Code:SM-B-01Y) BI-RADS CATEGORY  1: Negative. Electronically Signed   By: Kristopher Oppenheim M.D.   On: 08/27/2020 15:32    Recent Labs: Lab Results  Component Value Date   WBC 6.5 07/23/2020   HGB 12.6 07/23/2020   PLT 274 07/23/2020   NA 142 08/10/2020   K 4.6 08/10/2020   CL 102 08/10/2020   CO2 25 08/10/2020   GLUCOSE 86 08/10/2020   BUN 14 08/10/2020   CREATININE 0.88 08/10/2020   BILITOT 0.3 08/10/2020   ALKPHOS 61 08/10/2020   AST 19 08/10/2020   ALT 19 08/10/2020   PROT 7.3 08/10/2020   ALBUMIN 4.1 08/10/2020   CALCIUM 9.6 08/10/2020   GFRAA 86 08/10/2020   QFTBGOLDPLUS NEGATIVE 11/03/2019    Speciality Comments: Inadequate response to Enbrel, Humira, Simponi,  Remicade-allergic  response  Procedures:  No procedures performed Allergies: Demerol [meperidine], Imuran [azathioprine], Meperidine hcl, Remicade [infliximab], Sulfasalazine, Avelox [moxifloxacin], Doxycycline, Erythromycin, and Latex   Assessment / Plan:     Visit Diagnoses: Psoriatic arthritis (HCC)-patient psoriatic arthritis is well controlled.  She had no synovitis on examination today.  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  A prescription for clobetasol cream was given.  High risk medication use - Xeljanz 5 mg BID, MTX 0.6 ml sq once weekly, folic acid 2 mg po daily. - Plan: CBC with Differential/Platelet, QuantiFERON-TB Gold Plus, CMP14+EGFR in April and then every 3 months to monitor for drug toxicity.  TB gold is only once a year.  Her labs in January 2022 were normal.  I detailed discussion regarding the side effects of Morrie Sheldon with the patient again today.  Which include increased risk of infection and is stopping the medication in case she gets an infection.  Also increased risk of blood clots and cardiac and cerebrovascular events was discussed.  Dietary modifications weight loss and exercise was emphasized.  Increased risk of cancer with the use of check millimeters was also discussed.  Increased risk of shingles on check measures was discussed.  Have advised her to get shingrix vaccine as soon as possible.  Primary osteoarthritis of both knees-she continues to have some discomfort in her knee joints.  DDD (degenerative disc disease), lumbar-she had good range of motion of her lumbar spine.  She had mild tenderness over the left SI joint.  Which could be muscular.  Achilles tendonitis, bilateral - left.  She states she is off-and-on discomfort in the left Achilles tendon.  No tenosynovitis was noted today.  History of IBS  History of hypertension-her blood pressure is well controlled.  History of ulcerative colitis - Followed by Dr. Silverio Decamp.  Her  UC is much better control since she is on [place].  History of gastroesophageal reflux (GERD)  BMI 36.0-36.9,adult-weight gain noted.  Increased risk of heart disease and psoriatic arthritis was emphasized.  Weight loss diet and exercise was discussed.  I have given her information to the [facility].  Vitamin D deficiency-she was given vitamin D in the past.  She is not taking vitamin D currently.  She was advised to start on vitamin D 2000 units daily.  Former smoker - Followed by Dr. Lamonte Sakai  Educated about COVID-19 virus infection-she is fully vaccinated against COVID-19 and had 3 doses of vaccination.  She has been advised to get a booster 6 months after the third dose.  Instructions were placed in the AVS to stop methotrexate and Xeljanz for 1 week after the booster.  Use of mask, social distancing and hand hygiene was emphasized.  Orders: Orders Placed This Encounter  Procedures  . CBC with Differential/Platelet  . QuantiFERON-TB Gold Plus  . CMP14+EGFR   Meds ordered this encounter  Medications  . Tofacitinib Citrate (XELJANZ) 5 MG TABS    Sig: Take 1 tablet by mouth twice a day as directed by physician.    Dispense:  180 tablet    Refill:  0  . clobetasol cream (TEMOVATE) 0.05 %    Sig: Apply 1 application topically daily as needed.    Dispense:  30 g    Refill:  0     Follow-Up Instructions: Return in about 5 months (around 02/03/2021) for Psoriatic arthritis, UC.   Bo Merino, MD  Note - This record has been created using Editor, commissioning.  Chart creation errors have been sought, but may not always  have been located. Such creation errors do not reflect on  the standard of medical care.

## 2020-08-25 ENCOUNTER — Ambulatory Visit
Admission: RE | Admit: 2020-08-25 | Discharge: 2020-08-25 | Disposition: A | Discharge status: Home / Self Care | Payer: Medicare HMO | Source: Ambulatory Visit | Attending: Family Medicine | Admitting: Family Medicine

## 2020-08-25 ENCOUNTER — Other Ambulatory Visit: Payer: Self-pay

## 2020-08-25 DIAGNOSIS — Z1231 Encounter for screening mammogram for malignant neoplasm of breast: Secondary | ICD-10-CM

## 2020-09-06 ENCOUNTER — Ambulatory Visit: Payer: Medicare HMO | Admitting: Rheumatology

## 2020-09-06 ENCOUNTER — Encounter (INDEPENDENT_AMBULATORY_CARE_PROVIDER_SITE_OTHER): Payer: Self-pay | Admitting: Family Medicine

## 2020-09-06 ENCOUNTER — Encounter: Payer: Self-pay | Admitting: Rheumatology

## 2020-09-06 ENCOUNTER — Other Ambulatory Visit: Payer: Self-pay

## 2020-09-06 VITALS — BP 129/77 | HR 71 | Resp 16 | Ht 66.0 in | Wt 224.0 lb

## 2020-09-06 DIAGNOSIS — Z8719 Personal history of other diseases of the digestive system: Secondary | ICD-10-CM | POA: Diagnosis not present

## 2020-09-06 DIAGNOSIS — M7661 Achilles tendinitis, right leg: Secondary | ICD-10-CM

## 2020-09-06 DIAGNOSIS — Z7189 Other specified counseling: Secondary | ICD-10-CM

## 2020-09-06 DIAGNOSIS — Z8679 Personal history of other diseases of the circulatory system: Secondary | ICD-10-CM

## 2020-09-06 DIAGNOSIS — L409 Psoriasis, unspecified: Secondary | ICD-10-CM

## 2020-09-06 DIAGNOSIS — Z79899 Other long term (current) drug therapy: Secondary | ICD-10-CM | POA: Diagnosis not present

## 2020-09-06 DIAGNOSIS — Z87891 Personal history of nicotine dependence: Secondary | ICD-10-CM

## 2020-09-06 DIAGNOSIS — Z6836 Body mass index (BMI) 36.0-36.9, adult: Secondary | ICD-10-CM | POA: Diagnosis not present

## 2020-09-06 DIAGNOSIS — M170 Bilateral primary osteoarthritis of knee: Secondary | ICD-10-CM

## 2020-09-06 DIAGNOSIS — E559 Vitamin D deficiency, unspecified: Secondary | ICD-10-CM

## 2020-09-06 DIAGNOSIS — M5136 Other intervertebral disc degeneration, lumbar region: Secondary | ICD-10-CM

## 2020-09-06 DIAGNOSIS — E6609 Other obesity due to excess calories: Secondary | ICD-10-CM

## 2020-09-06 DIAGNOSIS — M7662 Achilles tendinitis, left leg: Secondary | ICD-10-CM

## 2020-09-06 DIAGNOSIS — L4050 Arthropathic psoriasis, unspecified: Secondary | ICD-10-CM

## 2020-09-06 MED ORDER — XELJANZ 5 MG PO TABS
ORAL_TABLET | ORAL | 0 refills | Status: DC
Start: 2020-09-06 — End: 2021-02-18

## 2020-09-06 MED ORDER — CLOBETASOL PROPIONATE 0.05 % EX CREA: 1.0000 "application " | TOPICAL_CREAM | Freq: Every day | CUTANEOUS | 0 refills | Status: DC | PRN

## 2020-09-06 NOTE — Patient Instructions (Addendum)
Standing Labs We placed an order today for your standing lab work.   Please have your standing labs drawn in April and every 3 months  If possible, please have your labs drawn 2 weeks prior to your appointment so that the provider can discuss your results at your appointment.  We have open lab daily Monday through Thursday from 1:30-4:30 PM and Friday from 1:30-4:00 PM at the office of Dr. Bo Merino, [facility].   Please be advised, all patients with office appointments requiring lab work will take precedents over walk-in lab work.  If possible, please come for your lab work on Monday and Friday afternoons, as you may experience shorter wait times. The office is located at [address], [place], [place], [place] [address] No appointment is necessary.   Labs are drawn by [facility]. Please bring your co-pay at the time of your lab draw.  You may receive a bill from [facility] for your lab work.  If you wish to have your labs drawn at another location, please call the office 24 hours in advance to send orders.  If you have any questions regarding directions or hours of operation,  please call [phone].   As a reminder, please drink plenty of water prior to coming for your lab work. Thanks!   Vaccines You are taking a medication(s) that can suppress your immune system.  The following immunizations are recommended: . Flu annually . Covid-19  . Pneumonia (Pneumovax 23 and Prevnar 13 spaced at least 1 year apart) . Shingrix (after age 2)  Please check with your PCP to make sure you are up to date.  Heart Disease Prevention   Your inflammatory disease increases your risk of heart disease which includes heart attack, stroke, atrial fibrillation (irregular heartbeats), high blood pressure, heart failure and atherosclerosis (plaque in the arteries).  It is important to reduce your risk by:   . Keep blood pressure, cholesterol, and blood sugar at healthy levels    . Smoking Cessation   . Maintain a healthy weight  o BMI 20-25   . Eat a healthy diet  o Plenty of fresh fruit, vegetables, and whole grains  o Limit saturated fats, foods high in sodium, and added sugars  o DASH and Mediterranean diet   . Increase physical activity  o Recommend moderate physically activity for 150 minutes per week/ 30 minutes a day for five days a week These can be broken up into three separate ten-minute sessions during the day.   . Reduce Stress  . Meditation, slow breathing exercises, yoga, coloring books  . Dental visits twice a year   COVID-19 vaccine recommendations:   COVID-19 vaccine is recommended for everyone (unless you are allergic to a vaccine component), even if you are on a medication that suppresses your immune system.   If you are on Methotrexate, Cellcept (mycophenolate), Rinvoq, Morrie Sheldon, and Olumiant- hold the medication for 1 week after each vaccine. Hold Methotrexate for 2 weeks after the single dose COVID-19 vaccine.   Do not take Tylenol or any anti-inflammatory medications (NSAIDs) 24 hours prior to the COVID-19 vaccination.   It is recommended that individuals taking immunosuppressive medications should take COVID-19 vaccination first 3 doses 1 month apart and then the fourth dose (booster) 6 months from the third dose.  There is no direct evidence about the efficacy of the COVID-19 vaccine in individuals who are on medications that suppress the immune system.   Even if you are fully vaccinated, and you  are on any medications that suppress your immune system, please continue to wear a mask, maintain at least six feet social distance and practice hand hygiene.   If you develop a COVID-19 infection, please contact your PCP or our office to determine if you need monoclonal antibody infusion.  The booster vaccine is now available for immunocompromised patients.   Please see the following web sites for updated information.    [email or web address]

## 2020-09-07 NOTE — Telephone Encounter (Signed)
Dr.Ukleja 

## 2020-09-09 ENCOUNTER — Other Ambulatory Visit: Payer: Self-pay | Admitting: Gastroenterology

## 2020-09-13 ENCOUNTER — Ambulatory Visit: Payer: Medicare HMO | Admitting: Internal Medicine

## 2020-09-13 ENCOUNTER — Encounter: Payer: Self-pay | Admitting: Internal Medicine

## 2020-09-13 ENCOUNTER — Other Ambulatory Visit: Payer: Self-pay

## 2020-09-13 VITALS — BP 122/68 | HR 70 | Ht 66.0 in | Wt 221.0 lb

## 2020-09-13 DIAGNOSIS — E7849 Other hyperlipidemia: Secondary | ICD-10-CM | POA: Diagnosis not present

## 2020-09-13 DIAGNOSIS — I10 Essential (primary) hypertension: Secondary | ICD-10-CM

## 2020-09-13 DIAGNOSIS — I700 Atherosclerosis of aorta: Secondary | ICD-10-CM

## 2020-09-13 DIAGNOSIS — L4050 Arthropathic psoriasis, unspecified: Secondary | ICD-10-CM

## 2020-09-13 DIAGNOSIS — Z8279 Family history of other congenital malformations, deformations and chromosomal abnormalities: Secondary | ICD-10-CM

## 2020-09-13 DIAGNOSIS — I2510 Atherosclerotic heart disease of native coronary artery without angina pectoris: Secondary | ICD-10-CM | POA: Diagnosis not present

## 2020-09-13 NOTE — Progress Notes (Signed)
Cardiology Office Note:    Date:  09/13/2020   ID:  [name], DOB [birth year], MRN [id number]  PCP:  Mayra Neer, MD   CC: DOE f/u  History of Present Illness:    [name] is a 55 y.o. female with a hx of HTN, HLD,former tobacco use- quit in 2011, morbid obesity, non-obstructive coronary artery disease, aortic atherosclerosis, bicuspid aortic valve in the family who presented 05/06/20 with angina pectoris.  Only non-obstructive CAD found on 05/10/20 LHC.  Echo performed 05/27/20 without evidence of bicuspid valve- last seen 06/14/20; presenting 09/13/20.  Patient notes that he is doing well.  Since day prior/last visit notes getting branches and seeing her grandchildren.  Relevant interval testing or therapy include creatinine increase with ASA 81 mg PO.  There are no interval hospital/ED visit.   No chest pain or pressure.  No SOB/DOE and no PND/Orthopnea.  No weight gain or leg swelling.  No palpitations or syncope .  Ambulatory BP 120/68  Past Medical History:  Diagnosis Date  . Allergy   . Anxiety   . Arthritis   . Asthma   . Back pain   . Cataract    both eyes  surgically repaired  . COPD (chronic obstructive pulmonary disease) ([place])   . Depression    treated  . Dyspnea   . Fibrocystic breast disease   . GERD (gastroesophageal reflux disease)   . History of hiatal hernia    repaired  . History of kidney stones    questionable  . Hyperlipidemia   . Hypertension    pt denies  . Hypothyroidism   . Irritable bowel syndrome   . Joint pain   . Osteoarthritis   . Ovarian cyst   . Palpitations   . Pancreatitis   . PONV (postoperative nausea and vomiting)    NO TROUBLE WITH CATARACT SURGERY  . Psoriasis   . Psoriatic arthritis ([place]) 05/12/2016   Poor response to Humira Inadequate response to Enbrel Inadequate response to Simponi  . Shortness of breath   . Tachycardia   . Ulcerative colitis     Past Surgical History:  Procedure Laterality Date  .  ABDOMINAL HYSTERECTOMY    . ANKLE RECONSTRUCTION     left  . bladder tuck    . CATARACT EXTRACTION W/PHACO Right 09/11/2017   Procedure: CATARACT EXTRACTION PHACO AND INTRAOCULAR LENS PLACEMENT (IOC);  Surgeon: Birder Robson, MD;  Location: [facility];  Service: Ophthalmology;  Laterality: Right;  [place] 00:18.5 AP% 6.9 CDE 1.29 Fluid Pack Lot # [id number]  . CATARACT EXTRACTION W/PHACO Left 10/02/2017   Procedure: CATARACT EXTRACTION PHACO AND INTRAOCULAR LENS PLACEMENT ([place]);  Surgeon: Birder Robson, MD;  Location: [facility];  Service: Ophthalmology;  Laterality: Left;  [place] 00:25.9 AP% 6.9 CDE 1.80 Fluid Pack Lot # [id number]  . CLOSED REDUCTION NASAL FRACTURE N/A 05/21/2018   Procedure: CLOSED REDUCTION NASAL FRACTURE;  Surgeon: Clyde Canterbury, MD;  Location: [place];  Service: ENT;  Laterality: N/A;  Latex sensitivity  . COLONOSCOPY    . EYE SURGERY     Lasik  . FRACTURE SURGERY    . KNEE SURGERY     left  . LAPAROSCOPIC NISSEN FUNDOPLICATION N/A 3/87/5643   Procedure: LAPAROSCOPIC NISSEN TAKEDOWN AND UPPER ENDOSCOPY;  Surgeon: Johnathan Hausen, MD;  Location: [facility];  Service: General;  Laterality: N/A;  . LEFT HEART CATH AND CORONARY ANGIOGRAPHY N/A 05/10/2020   Procedure: LEFT HEART CATH AND CORONARY ANGIOGRAPHY;  Surgeon: Daneen Schick  W, MD;  Location: [place] [facility];  Service: Cardiovascular;  Laterality: N/A;  . NASAL SEPTUM SURGERY    . NISSEN FUNDOPLICATION    . PARTIAL HYSTERECTOMY    . POLYPECTOMY    . repair cystocele and rectocele    . thyroid ablation    . TUBAL LIGATION    . UPPER GASTROINTESTINAL ENDOSCOPY    . UPPER GI ENDOSCOPY  01/12/2017   Procedure: UPPER GI ENDOSCOPY;  Surgeon: Johnathan Hausen, MD;  Location: [facility];  Service: General;;   Current Medications: Current Meds  Medication Sig  . aspirin-acetaminophen-caffeine (EXCEDRIN MIGRAINE) 250-250-65 MG tablet Take 2 tablets by mouth every 8 (eight) hours as needed for headache.   . clobetasol  cream (TEMOVATE) 2.20 % Apply 1 application topically daily as needed.  . dicyclomine (BENTYL) 20 MG tablet Take 1 tablet (20 mg total) by mouth 2 (two) times daily. (Patient taking differently: Take 20 mg by mouth as needed.)  . estradiol (ESTRACE) 1 MG tablet Take 1 mg by mouth daily.  . fluticasone (FLONASE) 50 MCG/ACT nasal spray Place 1 spray into both nostrils daily.  . folic acid (FOLVITE) 1 MG tablet Take 2 mg by mouth daily.  Marland Kitchen ibuprofen (ADVIL,MOTRIN) 200 MG tablet Take 400 mg by mouth every 6 (six) hours as needed for mild pain.   Marland Kitchen levocetirizine (XYZAL) 5 MG tablet Take 5 mg by mouth every evening.  Marland Kitchen levothyroxine (SYNTHROID) 125 MCG tablet Take 125 mcg by mouth every other day. Alternate with 137 mcg  . levothyroxine (SYNTHROID) 137 MCG tablet Take 137 mcg by mouth every other day. Alternate with 125 mcg  . methotrexate 50 MG/2ML injection Inject 0.6 mLs (15 mg total) into the skin once a week.  . metoprolol succinate (TOPROL-XL) 50 MG 24 hr tablet Take 25 mg by mouth daily. Take with or immediately following a meal.  . montelukast (SINGULAIR) 10 MG tablet Take 10 mg by mouth at bedtime.  . nitroGLYCERIN (NITROSTAT) 0.4 MG SL tablet Place 1 tablet (0.4 mg total) under the tongue every 5 (five) minutes as needed for chest pain.  Marland Kitchen omeprazole (PRILOSEC) 20 MG capsule TAKE 1 CAPSULE EVERY DAY BEFORE BREAKFAST  . rosuvastatin (CRESTOR) 20 MG tablet Take 20 mg by mouth daily.  . Tofacitinib Citrate (XELJANZ) 5 MG TABS Take 1 tablet by mouth twice a day as directed by physician.  . Tuberculin-Allergy Syringes 27G X 1/2" 1 ML KIT Inject 1 Syringe into the skin once a week. To be used with weekly methotrexate injections     Allergies:   Demerol [meperidine], Imuran [azathioprine], Meperidine hcl, Remicade [infliximab], Sulfasalazine, Avelox [moxifloxacin], Doxycycline, Erythromycin, and Latex   Social History   Socioeconomic History  . Marital status: Married    Spouse name: Not on  file  . Number of children: Not on file  . Years of education: Not on file  . Highest education level: Not on file  Occupational History  . 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 Occupation: disabled  Tobacco Use  . Smoking status: Former Smoker    Packs/day: 1.00    Years: 37.50    Pack years: 37.50    Types: Cigarettes    Quit date: 07/27/2009    Years since quitting: 11.1  . Smokeless tobacco: Never Used  Vaping Use  . Vaping Use: Never used  Substance and Sexual Activity  . Alcohol use: Yes    Alcohol/week: 0.0 standard drinks  Comment:  occasionally (1x/mo)  . Drug use: No  . Sexual activity: Yes  Other Topics Concern  . Not on file  Social History Narrative  . Not on file   Social Determinants of Health   Financial Resource Strain: Not on file  Food Insecurity: Not on file  Transportation Needs: Not on file  Physical Activity: Not on file  Stress: Not on file  Social Connections: Not on file    Family History: The patient's family history includes Alcohol abuse in her father; Alcoholism in her sister; Breast cancer in her maternal grandmother and paternal aunt; Breast cancer (age of onset: 8) in her mother; Cancer in her mother; Colon cancer in her mother; Colon polyps in her mother and paternal grandmother; Crohn's disease in her daughter and sister; Depression in her mother; Heart disease in her father and mother; Hyperlipidemia in her father and mother; Hypertension in her father and mother; Irritable bowel syndrome in an other family member; Other in her mother; Stroke in her father. There is no history of Esophageal cancer, Rectal cancer, Stomach cancer, or Pancreatic cancer.  Family history of early heart attacks (mom in 69s) Family history of mechanical heart valve after bicuspid valve  ROS:   Please see the history of present illness.     All other systems reviewed and are negative.  EKGs/Labs/Other Studies Reviewed:     The following studies were reviewed today:  EKG:   05/06/20 EKG SR rate 81 without ST/T changes.  Cardiopulmonary Exercise Test Date: 05/04/20 Interpretation  Notes: Patient gave a very good effort. Pulse-oximetry remained 95-98% for the duration of exercise (monitored with headprobe sensor).   ECG: Resting ECG in normal sinus rhythm. HR response appropriate. There were no sustained arrhythmias or ST-T changes. BP response hypertensive at peak exercise.   PFT: Pre-exercise spirometry demonstrates mild restrictive properties. The MVV was normal.   CPX: Exercise Capacity- Exercise testing with gas exchange demonstrates a normal peak VO2 of 17.4 ml/kg/min (94% of the age/gender/weight matched sedentary norms). The RER of 1.13 indicates a maximal effort. When adjusted to the patient's ideal body weight of 145.9 lb (66.2 kg) the peak VO2 is 26.6 ml/kg (ibw)/min (107% of the ibw-adjusted predicted).   Cardiovascular response- The O2pulse (a surrogate for stroke volume) increased with incremental exercise reaching peak at 13 ml/beat (1118% predicted).   Ventilatory response- The VE/VCO2 slope is normal. The oxygen uptake efficiency slope (OUES) is normal. The VO2 at the ventilatory threshold was normal at 84% of the predicted peak VO2. At peak exercise,  the ventilation reached 53% of the measured MVV and breathing reserve was 47 indicating ventilatory reserve remained. PETCO2 was normal at39 mmHg during exercise.   Conclusion: Exercise testing with gas exchange demonstrates normal functional capacity when compared to matched sedentary norms. There is no clear cardiopulmonary abnormality noted. However, there was a hypertensive response to exercise. With corrections for ideal body weight, body habitus appears to be contributing to exercise intolerance.  Transthoracic Echocardiogram Date: 05/27/20 1. Left ventricular ejection fraction, by estimation, is 60 to 65%. The  left ventricle has  normal function. The left ventricle has no regional  wall motion abnormalities. Left ventricular diastolic parameters were  normal.  2. Right ventricular systolic function is normal. The right ventricular  size is normal. Tricuspid regurgitation signal is inadequate for assessing  PA pressure.  3. The mitral valve is normal in structure. No evidence of mitral valve  regurgitation. No evidence of mitral stenosis.  4. The  aortic valve is tricuspid. Aortic valve regurgitation is not  visualized. No aortic stenosis is present.  5. The inferior vena cava is normal in size with greater than 50%  respiratory variability, suggesting right atrial pressure of 3 mmHg.   Conclusion(s)/Recommendation(s): Normal biventricular function without  evidence of hemodynamically significant valvular heart disease.   Left heart catheterization Date 05/27/20  Normal left ventricular function with LVEDP 15%.  Estimated ejection fraction 55%.  Widely patent left main.  10 to 20% ostial LAD.  10 to 20% ostial circumflex.  Dominant normal RCA.   Recent Labs: 10/27/2019: TSH 1.940 07/23/2020: Hemoglobin 12.6; Platelets 274 08/10/2020: ALT 19; BUN 14; Creatinine, Ser 0.88; Potassium 4.6; Sodium 142  Recent Lipid Panel    Component Value Date/Time   CHOL 153 05/07/2018 1024   TRIG 90 05/07/2018 1024   HDL 61 05/07/2018 1024   CHOLHDL 2.5 05/07/2018 1024   LDLCALC 75 05/07/2018 1024   04/13/20 HDL 58 LDL 70 Triglycerides 130 A1c 6%  Physical Exam:    VS:  BP 122/68   Pulse 70   Ht 5' 6"  (1.676 m)   Wt 221 lb (100.2 kg)   SpO2 99%   BMI 35.67 kg/m     Wt Readings from Last 3 Encounters:  09/13/20 221 lb (100.2 kg)  09/06/20 224 lb (101.6 kg)  06/14/20 221 lb (100.2 kg)    GEN: Obese, well developed in no acute distress HEENT: Normal NECK: No JVD; No carotid bruits LYMPHATICS: No lymphadenopathy CARDIAC: RRR, no murmurs, rubs, gallops  RESPIRATORY:  Clear to auscultation without  rales, wheezing or rhonchi  ABDOMEN: Soft, non-tender, non-distended MUSCULOSKELETAL:  No edema; No deformity  SKIN: Warm and dry NEUROLOGIC:  Alert and oriented x 3 PSYCHIATRIC:  Normal affect   ASSESSMENT:    1. Coronary artery disease involving native coronary artery of native heart without angina pectoris   2. Aortic atherosclerosis ([place])   3. Essential hypertension   4. Other hyperlipidemia   5. Psoriatic arthritis ([place])   6. Family history of bicuspid aortic valve    PLAN:    In order of problems listed above:  Non-obstructive CAD Morbid Obesity HTN HLD Former Tobacco Abuse Aortic atherosclerosis Psoriasis - asymptomatic but sedentary  - anatomy: mild nonobstructive disease - ASA 81 mg- caused increase in creatinine; will consider plavix in future visits ( takes Excedrin with no issue) - continue statin, goal LDL 70 - continue BB - CP free will defer CMR - one year cholesterol  Bicuspid Valve Family history - tricuspid aortic valve  One year  follow up unless new symptoms or abnormal test results warranting change in plan  Would be reasonable for  Video Visit Follow up Would be reasonable for  APP Follow up   Medication Adjustments/Labs and Tests Ordered: Current medicines are reviewed at length with the patient today.  Concerns regarding medicines are outlined above.  No orders of the defined types were placed in this encounter.  No orders of the defined types were placed in this encounter.   There are no Patient Instructions on file for this visit.   Signed, Werner Lean, MD  09/13/2020 10:01 AM    [place]

## 2020-09-13 NOTE — Patient Instructions (Signed)
Medication Instructions:  Your physician recommends that you continue on your current medications as directed. Please refer to the Current Medication list given to you today.  *If you need a refill on your cardiac medications before your next appointment, please call your pharmacy*   Lab Work: NONE If you have labs (blood work) drawn today and your tests are completely normal, you will receive your results only by: Marland Kitchen MyChart Message (if you have MyChart) OR . A paper copy in the mail If you have any lab test that is abnormal or we need to change your treatment, we will call you to review the results.   Testing/Procedures: NONE   Follow-Up: At [facility], you and your health needs are our priority.  As part of our continuing mission to provide you with exceptional heart care, we have created designated Provider Care Teams.  These Care Teams include your primary Cardiologist (physician) and Advanced Practice Providers (APPs -  Physician Assistants and Nurse Practitioners) who all work together to provide you with the care you need, when you need it.  We recommend signing up for the patient portal called "MyChart".  Sign up information is provided on this After Visit Summary.  MyChart is used to connect with patients for Virtual Visits (Telemedicine).  Patients are able to view lab/test results, encounter notes, upcoming appointments, etc.  Non-urgent messages can be sent to your provider as well.   To learn more about what you can do with MyChart, go to [email or web address].    Your next appointment:   1 year(s)  The format for your next appointment:   In Person  Provider:   You may see Werner Lean, MD or one of the following Advanced Practice Providers on your designated Care Team:    Melina Copa, PA-C  Ermalinda Barrios, PA-C

## 2020-09-16 ENCOUNTER — Encounter (INDEPENDENT_AMBULATORY_CARE_PROVIDER_SITE_OTHER): Payer: Self-pay | Admitting: Family Medicine

## 2020-09-16 ENCOUNTER — Other Ambulatory Visit: Payer: Self-pay

## 2020-09-16 ENCOUNTER — Ambulatory Visit (INDEPENDENT_AMBULATORY_CARE_PROVIDER_SITE_OTHER): Payer: Medicare HMO | Admitting: Family Medicine

## 2020-09-16 VITALS — BP 127/72 | HR 77 | Temp 98.1°F | Ht 66.0 in | Wt 219.0 lb

## 2020-09-16 DIAGNOSIS — E7849 Other hyperlipidemia: Secondary | ICD-10-CM

## 2020-09-16 DIAGNOSIS — Z9189 Other specified personal risk factors, not elsewhere classified: Secondary | ICD-10-CM | POA: Diagnosis not present

## 2020-09-16 DIAGNOSIS — F3289 Other specified depressive episodes: Secondary | ICD-10-CM | POA: Diagnosis not present

## 2020-09-16 DIAGNOSIS — Z6835 Body mass index (BMI) 35.0-35.9, adult: Secondary | ICD-10-CM | POA: Diagnosis not present

## 2020-09-16 DIAGNOSIS — E559 Vitamin D deficiency, unspecified: Secondary | ICD-10-CM | POA: Diagnosis not present

## 2020-09-16 DIAGNOSIS — R7303 Prediabetes: Secondary | ICD-10-CM
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
## 2020-09-17 LAB — COMPREHENSIVE METABOLIC PANEL
ALT: 17 IU/L (ref 0–32)
AST: 20 IU/L (ref 0–40)
Albumin/Globulin Ratio: 1.4 (ref 1.2–2.2)
Albumin: 4.2 g/dL (ref 3.8–4.9)
Alkaline Phosphatase: 70 IU/L (ref 44–121)
BUN/Creatinine Ratio: 14 (ref 9–23)
BUN: 12 mg/dL (ref 6–24)
Bilirubin Total: 0.5 mg/dL (ref 0.0–1.2)
CO2: 24 mmol/L (ref 20–29)
Calcium: 9.4 mg/dL (ref 8.7–10.2)
Chloride: 101 mmol/L (ref 96–106)
Creatinine, Ser: 0.88 mg/dL (ref 0.57–1.00)
Globulin, Total: 3.1 g/dL (ref 1.5–4.5)
Glucose: 85 mg/dL (ref 65–99)
Potassium: 4.8 mmol/L (ref 3.5–5.2)
Sodium: 145 mmol/L — ABNORMAL HIGH (ref 134–144)
Total Protein: 7.3 g/dL (ref 6.0–8.5)
eGFR: 78 mL/min/{1.73_m2} (ref >59)

## 2020-09-17 LAB — LIPID PANEL WITH LDL/HDL RATIO
Cholesterol, Total: 157 mg/dL (ref 100–199)
HDL: 61 mg/dL (ref >39)
LDL Chol Calc (NIH): 69 mg/dL (ref 0–99)
LDL/HDL Ratio: 1.1 ratio (ref 0.0–3.2)
Triglycerides: 160 mg/dL — ABNORMAL HIGH (ref 0–149)
VLDL Cholesterol Cal: 27 mg/dL (ref 5–40)

## 2020-09-17 LAB — INSULIN, RANDOM: INSULIN: 16.7 u[IU]/mL (ref 2.6–24.9)

## 2020-09-17 LAB — HEMOGLOBIN A1C
Est. average glucose Bld gHb Est-mCnc: 120 mg/dL
Hgb A1c MFr Bld: 5.8 % — ABNORMAL HIGH (ref 4.8–5.6)

## 2020-09-17 LAB — VITAMIN D 25 HYDROXY (VIT D DEFICIENCY, FRACTURES): Vit D, 25-Hydroxy: 33.2 ng/mL (ref 30.0–100.0)

## 2020-09-20 NOTE — Progress Notes (Signed)
Chief Complaint:   OBESITY [name] is here to discuss her progress with her obesity treatment plan along with follow-up of her obesity related diagnoses. [name] is not on a plan. [name] states she is walking 30 minutes 3 times per week.  Today's visit was #: 11 Starting weight: 224 lbs Starting date: 10/15/2019 Today's weight: 219 lbs Today's date: 09/16/2020 Total lbs lost to date: 5 lbs Total lbs lost since last in-office visit: 0  Interim History: [name] is to restart the plan, as her last appointment was 03/30/2020. She has lots of familial stress with her sister's attempted suicide and recent CAD work-up. She hasn't gone to the grocery store yet. She has no plans for the upcoming few weeks.  Subjective:   1. Pre-diabetes [name] never started Metformin or Victoza. Her last A1c was 6.0.  2. Other depression, with emotional eating  [name] realized she has done quite a bit of comfort eating over the last few months.  3. Other hyperlipidemia [name]'s last FLP was in 2019. She is on statin therapy. She denies transaminitis.   4. Vitamin D deficiency Pt denies nausea, vomiting, and muscle weakness but notes fatigue. Pt is not on a Vit D supplement.  5. At risk for diabetes mellitus [name] is at higher than average risk for developing diabetes due to obesity.   Assessment/Plan:   1. Pre-diabetes [name] will continue to work on weight loss, exercise, and decreasing simple carbohydrates to help decrease the risk of diabetes. Check labs today.  - Hemoglobin A1c - Insulin, random  2. Other depression, with emotional eating  Behavior modification techniques were discussed today to help [name] deal with her emotional/non-hunger eating behaviors.  Orders and follow up as documented in patient record.   3. Other hyperlipidemia Cardiovascular risk and specific lipid/LDL goals reviewed.  We discussed several lifestyle modifications today and [name] will continue to work on diet,  exercise and weight loss efforts. Orders and follow up as documented in patient record. Check labs today.  Counseling Intensive lifestyle modifications are the first line treatment for this issue. . Dietary changes: Increase soluble fiber. Decrease simple carbohydrates. . Exercise changes: Moderate to vigorous-intensity aerobic activity 150 minutes per week if tolerated. . Lipid-lowering medications: see documented in medical record.  - Comprehensive metabolic panel - Lipid Panel With LDL/HDL Ratio  4. Vitamin D deficiency Low Vitamin D level contributes to fatigue and are associated with obesity, breast, and colon cancer. She agrees to continue to follow-up for routine testing of Vitamin D, at least 2-3 times per year to avoid over-replacement. Check labs today.  - VITAMIN D 25 Hydroxy (Vit-D Deficiency, Fractures)  5. At risk for diabetes mellitus [name] was given approximately 15 minutes of diabetes education and counseling today. We discussed intensive lifestyle modifications today with an emphasis on weight loss as well as increasing exercise and decreasing simple carbohydrates in her diet. We also reviewed medication options with an emphasis on risk versus benefit of those discussed.   Repetitive spaced learning was employed today to elicit superior memory formation and behavioral change.  6. Class 2 severe obesity with serious comorbidity and body mass index (BMI) of 35.0 to 35.9 in adult, unspecified obesity type [facility]) [name] is currently in the action stage of change. As such, her goal is to continue with weight loss efforts. She has agreed to the Category 4 Plan with 8 oz protein.   Exercise goals: No exercise has been prescribed at this time.  Behavioral modification strategies:  increasing lean protein intake, meal planning and cooking strategies, keeping healthy foods in the home and planning for success.  [name] has agreed to follow-up with our clinic in 2 weeks. She was  informed of the importance of frequent follow-up visits to maximize her success with intensive lifestyle modifications for her multiple health conditions.   [name] was informed we would discuss her lab results at her next visit unless there is a critical issue that needs to be addressed sooner. [name] agreed to keep her next visit at the agreed upon time to discuss these results.  Objective:   Blood pressure 127/72, pulse 77, temperature 98.1 F (36.7 C), temperature source Oral, height 5' 6"  (1.676 m), weight 219 lb (99.3 kg), SpO2 98 %. Body mass index is 35.35 kg/m.  General: Cooperative, alert, well developed, in no acute distress. HEENT: Conjunctivae and lids unremarkable. Cardiovascular: Regular rhythm.  Lungs: Normal work of breathing. Neurologic: No focal deficits.   Lab Results  Component Value Date   CREATININE 0.88 09/16/2020   BUN 12 09/16/2020   NA 145 (H) 09/16/2020   K 4.8 09/16/2020   CL 101 09/16/2020   CO2 24 09/16/2020   Lab Results  Component Value Date   ALT 17 09/16/2020   AST 20 09/16/2020   ALKPHOS 70 09/16/2020   BILITOT 0.5 09/16/2020   Lab Results  Component Value Date   HGBA1C 5.8 (H) 09/16/2020   HGBA1C 6.0 (H) 03/16/2020   HGBA1C 5.8 (H) 10/15/2019   Lab Results  Component Value Date   INSULIN 16.7 09/16/2020   INSULIN 15.2 03/16/2020   INSULIN 20.8 10/27/2019   INSULIN CANCELED 10/15/2019   Lab Results  Component Value Date   TSH 1.940 10/27/2019   Lab Results  Component Value Date   CHOL 157 09/16/2020   HDL 61 09/16/2020   LDLCALC 69 09/16/2020   TRIG 160 (H) 09/16/2020   CHOLHDL 2.5 05/07/2018   Lab Results  Component Value Date   WBC 6.5 07/23/2020   HGB 12.6 07/23/2020   HCT 37.8 07/23/2020   MCV 96 07/23/2020   PLT 274 07/23/2020   Lab Results  Component Value Date   IRON 85 05/31/2015   FERRITIN 55.2 03/28/2018    Attestation Statements:   Reviewed by clinician on day of visit: allergies, medications,  problem list, medical history, surgical history, family history, social history, and previous encounter notes.  Coral Ceo, am acting as transcriptionist for Coralie Common, MD.   I have reviewed the above documentation for accuracy and completeness, and I agree with the above. - Jinny Blossom, MD
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
## 2020-09-27 ENCOUNTER — Telehealth (INDEPENDENT_AMBULATORY_CARE_PROVIDER_SITE_OTHER): Payer: Medicare HMO | Admitting: Psychology

## 2020-09-29 ENCOUNTER — Telehealth (INDEPENDENT_AMBULATORY_CARE_PROVIDER_SITE_OTHER): Payer: Medicare HMO | Admitting: Psychology

## 2020-09-29 DIAGNOSIS — F5089 Other specified eating disorder: Secondary | ICD-10-CM

## 2020-09-29 NOTE — Progress Notes (Signed)
  Office: [phone]  /  Fax: [phone]    Date: October 12, 2020   Appointment Start Time: 9:55am Duration: 19 minutes Provider: Glennie Isle, Psy.D. Type of Session: Individual Therapy  Location of Patient: Home Location of Provider: Provider's Home (private office) Type of Contact: Telepsychological Visit via MyChart Video Visit  Session Content: [name] is a 55 y.o. female presenting for a follow-up appointment to address the previously established treatment goal of increasing coping skills. Today's appointment was a telepsychological visit due to COVID-19. [name] provided verbal consent for today's telepsychological appointment and she is aware she is responsible for securing confidentiality on her end of the session. Prior to proceeding with today's appointment, [name]'s physical location at the time of this appointment was obtained as well a phone number she could be reached at in the event of technical difficulties. [name] and this provider participated in today's telepsychological service.   This provider conducted a brief check-in. [name] reported she is following her meal plan, noting she has not had any instances of emotional eating. She reported she feels she has made changes to focus on her well-being, including setting boundaries with family members and starting Lexapro. Positive reinforcement was provided. Psychoeducation regarding mindfulness was provided. A handout was provided to [facility] with further information regarding mindfulness, including exercises. This provider also explained the benefit of mindfulness as it relates to emotional eating. [name] was encouraged to engage in the provided exercises between now and the next appointment with this provider. [name] agreed. During today's appointment, [name] was led through a mindfulness exercise involving her senses. [name] provided verbal consent during today's appointment for this provider to send a handout about mindfulness via  e-mail. [name] was receptive to today's appointment as evidenced by openness to sharing, responsiveness to feedback, and willingness to engage in mindfulness exercises to assist with coping.  Mental Status Examination:  Appearance: well groomed and appropriate hygiene  Behavior: appropriate to circumstances Mood: euthymic Affect: mood congruent Speech: normal in rate, volume, and tone Eye Contact: appropriate Psychomotor Activity: unable to assess  Gait: unable to assess Thought Process: linear, logical, and goal directed  Thought Content/Perception: no hallucinations, delusions, bizarre thinking or behavior reported or observed and no evidence of suicidal and homicidal ideation, plan, and intent Orientation: time, person, place, and purpose of appointment Memory/Concentration: memory, attention, language, and fund of knowledge intact  Insight/Judgment: good  Interventions:  Conducted a brief chart review Provided empathic reflections and validation Provided positive reinforcement Employed supportive psychotherapy interventions to facilitate reduced distress and to improve coping skills with identified stressors Psychoeducation provided regarding mindfulness Engaged patient in mindfulness exercise(s)  DSM-5 Diagnosis(es): 307.59 (F50.8) Other Specified Feeding or Eating Disorder, Emotional Eating Behaviors  Treatment Goal & Progress: During the initial appointment with this provider, the following treatment goal was established: increase coping skills. [name] has demonstrated progress in her goal as evidenced by increased awareness of hunger patterns and reduction in emotional eating.   Plan: Based on recent progress per [name]'s self-report and an upcoming trip, the next appointment will be scheduled in approximately four weeks, which will be via MyChart Video Visit. The next session will focus on working towards the established treatment goal and and possible termination.

## 2020-10-04 ENCOUNTER — Other Ambulatory Visit: Payer: Self-pay | Admitting: Physician Assistant

## 2020-10-04 NOTE — Telephone Encounter (Signed)
Next Visit: 01/31/2021  Last Visit: 09/06/2020  Last Fill: 07/27/2020  DX: Psoriatic arthritis   Current Dose per office note 09/06/2020, MTX 0.6 ml sq once weekly  Labs: 09/16/2020, Solium 145, Hgb A1c 5.8, Triglycerides 160,  CBC 07/23/2020 CBC WNL. Creatinine is elevated-1.15 and GFR is low-54.  Please clarify if the patient has been taking any NSAIDs? Ibuprofen is on her medication list. Please advise her to avoid NSAID use.   Depending on her answer we may have to discuss reducing the dose of MTX.   Okay to refill MTX?

## 2020-10-06 ENCOUNTER — Encounter (INDEPENDENT_AMBULATORY_CARE_PROVIDER_SITE_OTHER): Payer: Self-pay | Admitting: Family Medicine

## 2020-10-06 ENCOUNTER — Other Ambulatory Visit: Payer: Self-pay

## 2020-10-06 ENCOUNTER — Ambulatory Visit (INDEPENDENT_AMBULATORY_CARE_PROVIDER_SITE_OTHER): Payer: Medicare HMO | Admitting: Family Medicine

## 2020-10-06 VITALS — BP 101/65 | HR 63 | Temp 98.0°F | Ht 66.0 in | Wt 219.0 lb

## 2020-10-06 DIAGNOSIS — R7303 Prediabetes: Secondary | ICD-10-CM

## 2020-10-06 DIAGNOSIS — Z6834 Body mass index (BMI) 34.0-34.9, adult: Secondary | ICD-10-CM

## 2020-10-06 DIAGNOSIS — Z6836 Body mass index (BMI) 36.0-36.9, adult: Secondary | ICD-10-CM

## 2020-10-06 DIAGNOSIS — E669 Obesity, unspecified: Secondary | ICD-10-CM | POA: Diagnosis not present

## 2020-10-06 DIAGNOSIS — F3289 Other specified depressive episodes: Secondary | ICD-10-CM

## 2020-10-06 MED ORDER — ESCITALOPRAM OXALATE 10 MG PO TABS: 10.0000 mg | ORAL_TABLET | Freq: Every day | ORAL | 0 refills | Status: DC

## 2020-10-07 NOTE — Progress Notes (Signed)
Chief Complaint:   OBESITY [name] is here to discuss her progress with her obesity treatment plan along with follow-up of her obesity related diagnoses. [name] is on the Category 4 Plan and states she is following her eating plan approximately 95% of the time. [name] states she is going to the [employer] 45 minutes 4-5 times per week.  Today's visit was #: 12 Starting weight: 224 lbs Starting date: 10/15/2019 Today's weight: 211 lbs Today's date: 10/06/2020 Total lbs lost to date: 13 lbs Total lbs lost since last in-office visit: 8 lbs  Interim History: [name] has gotten back to following the plan ~95%. Some days she can't eat all the food. If she is craving sweets, she does shortbread cookies or 100 calorie ice cream. [name] has put some distance between herself and her sister/familial drama and this has been extremely beneficial in helping her stay on track.  Subjective:   1. Pre-diabetes [name]'s 09/16/2020 A1c was 5.8. She is not on medication.   Lab Results  Component Value Date   HGBA1C 5.8 (H) 09/16/2020   Lab Results  Component Value Date   INSULIN 16.7 09/16/2020   INSULIN 15.2 03/16/2020   INSULIN 20.8 10/27/2019   INSULIN CANCELED 10/15/2019    2. Other depression [name] reports increase in symptoms with stopping Zoloft. She had sexual side effects with Zoloft.  Assessment/Plan:   1. Pre-diabetes [name] will continue to work on weight loss, exercise, and decreasing simple carbohydrates to help decrease the risk of diabetes. Repeat labs in 3 months and continue category 4 plan.   2. Other depression Behavior modification techniques were discussed today to help [name] deal with her emotional/non-hunger eating behaviors.  Orders and follow up as documented in patient record. Start Lexapro 10 mg, as per below.  - escitalopram (LEXAPRO) 10 MG tablet; Take 1 tablet (10 mg total) by mouth daily.  Dispense: 30 tablet; Refill: 0  3. Class 1 obesity with serious comorbidity and body  mass index (BMI) of 34.0 to 34.9 in adult, unspecified obesity type [name] is currently in the action stage of change. As such, her goal is to continue with weight loss efforts. She has agreed to the Category 4 Plan.   Exercise goals: As is  Behavioral modification strategies: increasing lean protein intake, meal planning and cooking strategies and keeping healthy foods in the home.  [name] has agreed to follow-up with our clinic in 2-3 weeks. She was informed of the importance of frequent follow-up visits to maximize her success with intensive lifestyle modifications for her multiple health conditions.   Objective:   Blood pressure 101/65, pulse 63, temperature 98 F (36.7 C), temperature source Oral, height 5' 6"  (1.676 m), weight 219 lb (99.3 kg), SpO2 97 %. Body mass index is 35.35 kg/m.  General: Cooperative, alert, well developed, in no acute distress. HEENT: Conjunctivae and lids unremarkable. Cardiovascular: Regular rhythm.  Lungs: Normal work of breathing. Neurologic: No focal deficits.   Lab Results  Component Value Date   CREATININE 0.88 09/16/2020   BUN 12 09/16/2020   NA 145 (H) 09/16/2020   K 4.8 09/16/2020   CL 101 09/16/2020   CO2 24 09/16/2020   Lab Results  Component Value Date   ALT 17 09/16/2020   AST 20 09/16/2020   ALKPHOS 70 09/16/2020   BILITOT 0.5 09/16/2020   Lab Results  Component Value Date   HGBA1C 5.8 (H) 09/16/2020   HGBA1C 6.0 (H) 03/16/2020   HGBA1C 5.8 (H) 10/15/2019  Lab Results  Component Value Date   INSULIN 16.7 09/16/2020   INSULIN 15.2 03/16/2020   INSULIN 20.8 10/27/2019   INSULIN CANCELED 10/15/2019   Lab Results  Component Value Date   TSH 1.940 10/27/2019   Lab Results  Component Value Date   CHOL 157 09/16/2020   HDL 61 09/16/2020   LDLCALC 69 09/16/2020   TRIG 160 (H) 09/16/2020   CHOLHDL 2.5 05/07/2018   Lab Results  Component Value Date   WBC 6.5 07/23/2020   HGB 12.6 07/23/2020   HCT 37.8 07/23/2020    MCV 96 07/23/2020   PLT 274 07/23/2020   Lab Results  Component Value Date   IRON 85 05/31/2015   FERRITIN 55.2 03/28/2018    Obesity Behavioral Intervention:   Approximately 15 minutes were spent on the discussion below.  ASK: We discussed the diagnosis of obesity with [name] today and Monda agreed to give [place] permission to discuss obesity behavioral modification therapy today.  ASSESS: Floride has the diagnosis of obesity and her BMI today is 34.2. Bryna is in the action stage of change.   ADVISE: Hanley was educated on the multiple health risks of obesity as well as the benefit of weight loss to improve her health. She was advised of the need for long term treatment and the importance of lifestyle modifications to improve her current health and to decrease her risk of future health problems.  AGREE: Multiple dietary modification options and treatment options were discussed and Elyza agreed to follow the recommendations documented in the above note.  ARRANGE: Lorree was educated on the importance of frequent visits to treat obesity as outlined per CMS and USPSTF guidelines and agreed to schedule her next follow up appointment today.  Attestation Statements:   Reviewed by clinician on day of visit: allergies, medications, problem list, medical history, surgical history, family history, social history, and previous encounter notes.  [name] Ceo, am acting as transcriptionist for Carla Common, MD.   I have reviewed the above documentation for accuracy and completeness, and I agree with the above. - Jinny Blossom, MD

## 2020-10-12 ENCOUNTER — Telehealth (INDEPENDENT_AMBULATORY_CARE_PROVIDER_SITE_OTHER): Payer: Medicare HMO | Admitting: Psychology

## 2020-10-12 DIAGNOSIS — F5089 Other specified eating disorder: Secondary | ICD-10-CM

## 2020-10-25 ENCOUNTER — Other Ambulatory Visit: Payer: Self-pay

## 2020-10-25 ENCOUNTER — Ambulatory Visit (INDEPENDENT_AMBULATORY_CARE_PROVIDER_SITE_OTHER): Payer: Medicare HMO | Admitting: Family Medicine

## 2020-10-25 ENCOUNTER — Encounter (INDEPENDENT_AMBULATORY_CARE_PROVIDER_SITE_OTHER): Payer: Self-pay | Admitting: Family Medicine

## 2020-10-25 VITALS — BP 106/72 | HR 64 | Temp 98.6°F | Ht 66.0 in | Wt 205.0 lb

## 2020-10-25 DIAGNOSIS — R7303 Prediabetes: Secondary | ICD-10-CM

## 2020-10-25 DIAGNOSIS — Z6836 Body mass index (BMI) 36.0-36.9, adult: Secondary | ICD-10-CM | POA: Diagnosis not present

## 2020-10-25 DIAGNOSIS — Z9189 Other specified personal risk factors, not elsewhere classified: Secondary | ICD-10-CM

## 2020-10-25 DIAGNOSIS — F3289 Other specified depressive episodes: Secondary | ICD-10-CM | POA: Diagnosis not present

## 2020-10-25 MED ORDER — ESCITALOPRAM OXALATE 20 MG PO TABS: 20.0000 mg | ORAL_TABLET | Freq: Every day | ORAL | 0 refills | Status: DC

## 2020-10-27 NOTE — Progress Notes (Signed)
  Office: [phone]  /  Fax: [phone]    Date: November 10, 2020   Appointment Start Time: 9:59am Duration: 19 minutes Provider: Glennie Isle, Psy.D. Type of Session: Individual Therapy  Location of Patient: Home Location of Provider: Provider's Home (private office) Type of Contact: Telepsychological Visit via MyChart Video Visit  Session Content: [name] is a 55 y.o. female presenting for a follow-up appointment to address the previously established treatment goal of increasing coping skills. Today's appointment was a telepsychological visit due to COVID-19. [name] provided verbal consent for today's telepsychological appointment and she is aware she is responsible for securing confidentiality on her end of the session. Prior to proceeding with today's appointment, [name]'s physical location at the time of this appointment was obtained as well a phone number she could be reached at in the event of technical difficulties. [name] and this provider participated in today's telepsychological service.   This provider conducted a brief check-in. [name] shared about her recent vacation, adding her eating "went really well." Session focused further on mindfulness to assist with coping. She discussed experiencing increased awareness since discussing mindfulness, noting it helped her make better choices and engage in portion control while on vacation. Positive reinforcement was provided.  [name] was led through a mindfulness exercise (A Taste of Mindfulness) and her experience was processed. This provider also discussed the utilization of YouTube for mindfulness exercises (e.g., exercises by Merri Ray). [name] provided verbal consent during today's appointment for this provider to send the handout for today's exercise via e-mail. [name] was receptive to today's appointment as evidenced by openness to sharing, responsiveness to feedback, and willingness to continue engaging in mindfulness  exercises.  Mental Status Examination:  Appearance: well groomed and appropriate hygiene  Behavior: appropriate to circumstances Mood: euthymic Affect: mood congruent Speech: normal in rate, volume, and tone Eye Contact: appropriate Psychomotor Activity: unable to assess  Gait: unable to assess Thought Process: linear, logical, and goal directed  Thought Content/Perception: no hallucinations, delusions, bizarre thinking or behavior reported or observed and no evidence or endorsement of suicidal and homicidal ideation, plan, and intent Orientation: time, person, place, and purpose of appointment Memory/Concentration: memory, attention, language, and fund of knowledge intact  Insight/Judgment: good  Interventions:  Conducted a brief chart review Provided empathic reflections and validation Reviewed content from the previous session Provided positive reinforcement Employed supportive psychotherapy interventions to facilitate reduced distress and to improve coping skills with identified stressors Engaged patient in mindfulness exercise(s)  DSM-5 Diagnosis(es): F50.89 Other Specified Feeding or Eating Disorder, Emotional Eating Behaviors   Treatment Goal & Progress: During the initial appointment with this provider, the following treatment goal was established: increase coping skills. [name] demonstrated progress in her goal as evidenced by increased awareness of hunger patterns and reduction in emotional eating. [name] also continues to demonstrate willingness to engage in learned skill(s).  Plan: [name] declined future appointments with this provider at this time due to progress. She acknowledged understanding that she may request a follow-up appointment with this provider in the future as long as she is still established with the clinic. No further follow-up planned by this provider.

## 2020-10-28 NOTE — Progress Notes (Signed)
Chief Complaint:   OBESITY [name] is here to discuss her progress with her obesity treatment plan along with follow-up of her obesity related diagnoses. [name] is on the Category 4 Plan and states she is following her eating plan approximately 90-95% of the time. [name] states she is walking and doing cardio 45 minutes 4-5 times per week.  Today's visit was #: 4 Starting weight: 224 lbs Starting date: 10/15/2019 Today's weight: 205 lbs Today's date: 10/25/2020 Total lbs lost to date: 19 Total lbs lost since last in-office visit: 6  Interim History: [name] is working toward getting all food in on category 4. She finds large portion of meat to be most difficult. Snack calories have been minimal, occasional no sugar added ice cream, shortbread cookies. She is going on a 12 day long vacation to [place] and [place] on bike.  Subjective:   1. Other depression Silas denies suicidal or homicidal ideations. She is on Lexapro. Her symptoms are better controlled but not significantly so.  2. Pre-diabetes [name]'s last A1c 5.8 and insulin level 16.7. She is not on medication.  3. At risk for diabetes mellitus [name] is at higher than average risk for developing diabetes due to obesity.   Assessment/Plan:   1. Other depression Behavior modification techniques were discussed today to help Tacara deal with her emotional/non-hunger eating behaviors.  Orders and follow up as documented in patient record. Increase Lexapro as prescribed below.  - escitalopram (LEXAPRO) 20 MG tablet; Take 1 tablet (20 mg total) by mouth daily.  Dispense: 30 tablet; Refill: 0  2. Pre-diabetes Anesha will continue to work on weight loss, exercise, and decreasing simple carbohydrates to help decrease the risk of diabetes. Repeat labs in August.  3. At risk for diabetes mellitus [name] was given approximately 15 minutes of diabetes education and counseling today. We discussed intensive lifestyle  modifications today with an emphasis on weight loss as well as increasing exercise and decreasing simple carbohydrates in her diet. We also reviewed medication options with an emphasis on risk versus benefit of those discussed.   Repetitive spaced learning was employed today to elicit superior memory formation and behavioral change.  4. Class 2 severe obesity with serious comorbidity and body mass index (BMI) of 36.0 to 36.9 in adult, unspecified obesity type [facility]) [name] is currently in the action stage of change. As such, her goal is to continue with weight loss efforts. She has agreed to the Category 4 Plan.   Exercise goals: Bike trip  Behavioral modification strategies: increasing lean protein intake, meal planning and cooking strategies, keeping healthy foods in the home and planning for success.  [name] has agreed to follow-up with our clinic in 3 weeks. She was informed of the importance of frequent follow-up visits to maximize her success with intensive lifestyle modifications for her multiple health conditions.   Objective:   Blood pressure 106/72, pulse 64, temperature 98.6 F (37 C), height 5' 6"  (1.676 m), weight 205 lb (93 kg), SpO2 98 %. Body mass index is 33.09 kg/m.  General: Cooperative, alert, well developed, in no acute distress. HEENT: Conjunctivae and lids unremarkable. Cardiovascular: Regular rhythm.  Lungs: Normal work of breathing. Neurologic: No focal deficits.   Lab Results  Component Value Date   CREATININE 0.88 09/16/2020   BUN 12 09/16/2020   NA 145 (H) 09/16/2020   K 4.8 09/16/2020   CL 101 09/16/2020   CO2 24 09/16/2020   Lab Results  Component Value Date  ALT 17 09/16/2020   AST 20 09/16/2020   ALKPHOS 70 09/16/2020   BILITOT 0.5 09/16/2020   Lab Results  Component Value Date   HGBA1C 5.8 (H) 09/16/2020   HGBA1C 6.0 (H) 03/16/2020   HGBA1C 5.8 (H) 10/15/2019   Lab Results  Component Value Date   INSULIN 16.7 09/16/2020   INSULIN  15.2 03/16/2020   INSULIN 20.8 10/27/2019   INSULIN CANCELED 10/15/2019   Lab Results  Component Value Date   TSH 1.940 10/27/2019   Lab Results  Component Value Date   CHOL 157 09/16/2020   HDL 61 09/16/2020   LDLCALC 69 09/16/2020   TRIG 160 (H) 09/16/2020   CHOLHDL 2.5 05/07/2018   Lab Results  Component Value Date   WBC 6.5 07/23/2020   HGB 12.6 07/23/2020   HCT 37.8 07/23/2020   MCV 96 07/23/2020   PLT 274 07/23/2020   Lab Results  Component Value Date   IRON 85 05/31/2015   FERRITIN 55.2 03/28/2018     Attestation Statements:   Reviewed by clinician on day of visit: allergies, medications, problem list, medical history, surgical history, family history, social history, and previous encounter notes.  Coral Ceo, am acting as transcriptionist for Coralie Common, MD.   I have reviewed the above documentation for accuracy and completeness, and I agree with the above. - Jinny Blossom, MD

## 2020-11-10 ENCOUNTER — Telehealth (INDEPENDENT_AMBULATORY_CARE_PROVIDER_SITE_OTHER): Payer: Medicare HMO | Admitting: Psychology

## 2020-11-10 DIAGNOSIS — F5089 Other specified eating disorder: Secondary | ICD-10-CM | POA: Diagnosis not present

## 2020-11-12 ENCOUNTER — Other Ambulatory Visit: Payer: Self-pay | Admitting: *Deleted

## 2020-11-12 ENCOUNTER — Telehealth: Payer: Self-pay | Admitting: Rheumatology

## 2020-11-12 DIAGNOSIS — Z79899 Other long term (current) drug therapy: Secondary | ICD-10-CM

## 2020-11-12 NOTE — Telephone Encounter (Signed)
Patient going to [facility] this pm. Please release orders.

## 2020-11-12 NOTE — Telephone Encounter (Signed)
released

## 2020-11-12 NOTE — Addendum Note (Signed)
Addended by: Shona Needles on: 11/12/2020 12:20 PM   Modules accepted: Orders

## 2020-11-13 NOTE — Progress Notes (Signed)
CBC is normal.  Glucose is elevated, probably not a fasting sample.  TB gold is pending.

## 2020-11-15 ENCOUNTER — Ambulatory Visit (INDEPENDENT_AMBULATORY_CARE_PROVIDER_SITE_OTHER): Payer: Medicare HMO | Admitting: Family Medicine

## 2020-11-15 ENCOUNTER — Other Ambulatory Visit: Payer: Self-pay

## 2020-11-15 ENCOUNTER — Encounter (INDEPENDENT_AMBULATORY_CARE_PROVIDER_SITE_OTHER): Payer: Self-pay | Admitting: Family Medicine

## 2020-11-15 VITALS — BP 114/71 | HR 60 | Ht 66.0 in | Wt 202.0 lb

## 2020-11-15 DIAGNOSIS — Z9189 Other specified personal risk factors, not elsewhere classified: Secondary | ICD-10-CM | POA: Diagnosis not present

## 2020-11-15 DIAGNOSIS — F3289 Other specified depressive episodes: Secondary | ICD-10-CM

## 2020-11-15 DIAGNOSIS — Z6836 Body mass index (BMI) 36.0-36.9, adult: Secondary | ICD-10-CM | POA: Diagnosis not present

## 2020-11-15 DIAGNOSIS — E7849 Other hyperlipidemia: Secondary | ICD-10-CM

## 2020-11-15 MED ORDER — ESCITALOPRAM OXALATE 20 MG PO TABS: 20.0000 mg | ORAL_TABLET | Freq: Every day | ORAL | 0 refills | Status: DC

## 2020-11-16 LAB — CMP14+EGFR
ALT: 22 IU/L (ref 0–32)
AST: 28 IU/L (ref 0–40)
Albumin/Globulin Ratio: 1.3 (ref 1.2–2.2)
Albumin: 4.4 g/dL (ref 3.8–4.9)
Alkaline Phosphatase: 67 IU/L (ref 44–121)
BUN/Creatinine Ratio: 19 (ref 9–23)
BUN: 16 mg/dL (ref 6–24)
Bilirubin Total: 0.5 mg/dL (ref 0.0–1.2)
CO2: 25 mmol/L (ref 20–29)
Calcium: 9.7 mg/dL (ref 8.7–10.2)
Chloride: 100 mmol/L (ref 96–106)
Creatinine, Ser: 0.83 mg/dL (ref 0.57–1.00)
Globulin, Total: 3.3 g/dL (ref 1.5–4.5)
Glucose: 141 mg/dL — ABNORMAL HIGH (ref 65–99)
Potassium: 4.5 mmol/L (ref 3.5–5.2)
Sodium: 141 mmol/L (ref 134–144)
Total Protein: 7.7 g/dL (ref 6.0–8.5)
eGFR: 83 mL/min/{1.73_m2} (ref >59)

## 2020-11-16 LAB — CBC WITH DIFFERENTIAL/PLATELET
Basophils Absolute: 0 10*3/uL (ref 0.0–0.2)
Basos: 1 %
EOS (ABSOLUTE): 0 10*3/uL (ref 0.0–0.4)
Eos: 1 %
Hematocrit: 41.4 % (ref 34.0–46.6)
Hemoglobin: 13.9 g/dL (ref 11.1–15.9)
Immature Grans (Abs): 0 10*3/uL (ref 0.0–0.1)
Immature Granulocytes: 0 %
Lymphocytes Absolute: 2.7 10*3/uL (ref 0.7–3.1)
Lymphs: 46 %
MCH: 31.7 pg (ref 26.6–33.0)
MCHC: 33.6 g/dL (ref 31.5–35.7)
MCV: 95 fL (ref 79–97)
Monocytes Absolute: 0.5 10*3/uL (ref 0.1–0.9)
Monocytes: 8 %
Neutrophils Absolute: 2.5 10*3/uL (ref 1.4–7.0)
Neutrophils: 44 %
Platelets: 293 10*3/uL (ref 150–450)
RBC: 4.38 x10E6/uL (ref 3.77–5.28)
RDW: 12.4 % (ref 11.7–15.4)
WBC: 5.7 10*3/uL (ref 3.4–10.8)

## 2020-11-16 LAB — QUANTIFERON-TB GOLD PLUS
QuantiFERON Mitogen Value: >10 IU/mL
QuantiFERON Nil Value: 0.14 IU/mL
QuantiFERON TB1 Ag Value: 0.16 IU/mL
QuantiFERON TB2 Ag Value: 0.11 IU/mL
QuantiFERON-TB Gold Plus: NEGATIVE

## 2020-11-16 NOTE — Progress Notes (Signed)
Chief Complaint:   OBESITY [name] is here to discuss her progress with her obesity treatment plan along with follow-up of her obesity related diagnoses. [name] is on the Category 4 Plan and states she is following her eating plan approximately 90% of the time. [name] states she is walking and strength training 45-60 minutes 3-4 times per week.  Today's visit was #: 14 Starting weight: 224 lbs Starting date: 10/15/2019 Today's weight: 202 lbs Today's date: 11/15/2020 Total lbs lost to date: 22 Total lbs lost since last in-office visit: 3  Interim History: [name] had vacation, birthday party, and wedding over the last few weeks. She went to [place] over the last few weeks. She ate out most meals. Besides the above excursions, pt voices she's been strictly following the plan. No hunger at all. For snacks, she is using olive oil to cook and creamer in coffee.  Subjective:   1. Other depression [name] is doing well on increased dose of Lexapro. She is now on 20 mg. Pt denies suicidal or homicidal ideations.  2. Other hyperlipidemia [name] is on Crestor. She denies myalgias or transaminitis. Last LDL of 69, triglycerides 160, and HDL 61.  3. At risk for heart disease [name] is at a higher than average risk for cardiovascular disease due to obesity.   Assessment/Plan:   1. Other depression Behavior modification techniques were discussed today to help [name] deal with her emotional/non-hunger eating behaviors.  Orders and follow up as documented in patient record.   - escitalopram (LEXAPRO) 20 MG tablet; Take 1 tablet (20 mg total) by mouth daily.  Dispense: 90 tablet; Refill: 0  2. Other hyperlipidemia Cardiovascular risk and specific lipid/LDL goals reviewed.  We discussed several lifestyle modifications today and [name] will continue to work on diet, exercise and weight loss efforts. Orders and follow up as documented in patient record. Continue Crestor with  no change in dose.  Counseling Intensive lifestyle modifications are the first line treatment for this issue. . Dietary changes: Increase soluble fiber. Decrease simple carbohydrates. . Exercise changes: Moderate to vigorous-intensity aerobic activity 150 minutes per week if tolerated. . Lipid-lowering medications: see documented in medical record.  3. At risk for heart disease [name] was given approximately 15 minutes of coronary artery disease prevention counseling today. She is 55 y.o. female and has risk factors for heart disease including obesity. We discussed intensive lifestyle modifications today with an emphasis on specific weight loss instructions and strategies.   Repetitive spaced learning was employed today to elicit superior memory formation and behavioral change.  4. Class 2 severe obesity with serious comorbidity and body mass index (BMI) of 36.0 to 36.9 in adult, unspecified obesity type [name])  [name] is currently in the action stage of change. As such, her goal is to continue with weight loss efforts. She has agreed to the Category 4 Plan.   Exercise goals: As is  Behavioral modification strategies: increasing lean protein intake, meal planning and cooking strategies, keeping healthy foods in the home and planning for success.  [name] has agreed to follow-up with our clinic in 3 weeks. She was informed of the importance of frequent follow-up visits to maximize her success with intensive lifestyle modifications for her multiple health conditions.   Objective:   Blood pressure 114/71, pulse 60, height 5' 6"  (1.676 m), weight 202 lb (91.6 kg), SpO2 100 %. Body mass index is 32.6 kg/m.  General: Cooperative, alert, well developed, in no acute distress.  HEENT: Conjunctivae and lids unremarkable. Cardiovascular: Regular rhythm.  Lungs: Normal work of breathing. Neurologic: No focal deficits.   Lab Results  Component Value Date   CREATININE 0.83 11/12/2020   BUN 16  11/12/2020   NA 141 11/12/2020   K 4.5 11/12/2020   CL 100 11/12/2020   CO2 25 11/12/2020   Lab Results  Component Value Date   ALT 22 11/12/2020   AST 28 11/12/2020   ALKPHOS 67 11/12/2020   BILITOT 0.5 11/12/2020   Lab Results  Component Value Date   HGBA1C 5.8 (H) 09/16/2020   HGBA1C 6.0 (H) 03/16/2020   HGBA1C 5.8 (H) 10/15/2019   Lab Results  Component Value Date   INSULIN 16.7 09/16/2020   INSULIN 15.2 03/16/2020   INSULIN 20.8 10/27/2019   INSULIN CANCELED 10/15/2019   Lab Results  Component Value Date   TSH 1.940 10/27/2019   Lab Results  Component Value Date   CHOL 157 09/16/2020   HDL 61 09/16/2020   LDLCALC 69 09/16/2020   TRIG 160 (H) 09/16/2020   CHOLHDL 2.5 05/07/2018   Lab Results  Component Value Date   WBC 5.7 11/12/2020   HGB 13.9 11/12/2020   HCT 41.4 11/12/2020   MCV 95 11/12/2020   PLT 293 11/12/2020   Lab Results  Component Value Date   IRON 85 05/31/2015   FERRITIN 55.2 03/28/2018   Attestation Statements:   Reviewed by clinician on day of visit: allergies, medications, problem list, medical history, surgical history, family history, social history, and previous encounter notes.  Coral Ceo, am acting as transcriptionist for Coralie Common, MD.   I have reviewed the above documentation for accuracy and completeness, and I agree with the above. - Jinny Blossom, MD

## 2020-12-07 ENCOUNTER — Ambulatory Visit (INDEPENDENT_AMBULATORY_CARE_PROVIDER_SITE_OTHER): Payer: Medicare HMO | Admitting: Family Medicine

## 2020-12-13 ENCOUNTER — Other Ambulatory Visit: Payer: Self-pay | Admitting: Physician Assistant

## 2021-01-18 NOTE — Progress Notes (Deleted)
Office Visit Note  Patient: [name]             Date of Birth: 18-Mar-1966           MRN: [id number]             PCP: Mayra Neer, MD Referring: Mayra Neer, MD Visit Date: 01/31/2021 Occupation: @GUAROCC @  Subjective:    History of Present Illness: [name] is a 55 y.o. female with history of psoriatic arthritis, osteoarthritis, and DDD. She is on xeljanz 5 mg BID, methotrexate 0.6 ml sq injections once weekly, and folic acid 2 mg daily.   CBC and CMP updated on 11/12/20.  Orders for CBC and CMP released today.  Her next lab work will be due in October and every 3 months.  TB gold negative on 11/12/20.   Activities of Daily Living:  Patient reports morning stiffness for *** {minute/hour:19697}.   Patient {ACTIONS;DENIES/REPORTS:21021675::"Denies"} nocturnal pain.  Difficulty dressing/grooming: {ACTIONS;DENIES/REPORTS:21021675::"Denies"} Difficulty climbing stairs: {ACTIONS;DENIES/REPORTS:21021675::"Denies"} Difficulty getting out of chair: {ACTIONS;DENIES/REPORTS:21021675::"Denies"} Difficulty using hands for taps, buttons, cutlery, and/or writing: {ACTIONS;DENIES/REPORTS:21021675::"Denies"}  No Rheumatology ROS completed.   PMFS History:  Patient Active Problem List   Diagnosis Date Noted   Coronary artery disease involving native coronary artery of native heart without angina pectoris 06/14/2020   Aortic atherosclerosis ([place]) 05/06/2020   Family history of bicuspid aortic valve 05/06/2020   Dyspnea on exertion 03/31/2020   Prediabetes 01/13/2020   Vitamin D deficiency 12/24/2019   Depression 12/24/2019   Morbid obesity ([place]) 12/24/2019   Allergic rhinitis 04/19/2018   Obstructive sleep apnea syndrome, mild 04/19/2018   COPD (chronic obstructive pulmonary disease) ([place]) 03/26/2018   Rectal bleeding 01/22/2018   LLQ abdominal pain 01/22/2018   Dysphagia 01/12/2017   Spondylosis of lumbar region without myelopathy or radiculopathy 08/11/2016    Primary osteoarthritis of both knees 08/11/2016   History of hypertension 08/11/2016   History of ulcerative colitis 08/11/2016   History of IBS 08/11/2016   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   High risk medications (not anticoagulants) long-term use 05/13/2016   Knee pain, bilateral 05/13/2016   Anserine bursitis 05/13/2016   Chondromalacia patellae, left knee 05/13/2016   Chondromalacia patellae, right knee 05/13/2016   Psoriatic arthritis ([place]) 05/12/2016   UNSPECIFIED HYPOTHYROIDISM 08/24/2009   Other hyperlipidemia 08/24/2009   Essential hypertension 08/24/2009   GERD 08/24/2009   Ulcerative colitis ([place]) 08/24/2009   IRRITABLE BOWEL SYNDROME 08/24/2009   OVARIAN CYST 08/24/2009   PSORIASIS 08/24/2009   ARTHRITIS 08/24/2009   TACHYCARDIA, HX OF 08/24/2009   PANCREATITIS, HX OF 08/24/2009   FIBROCYSTIC BREAST DISEASE, HX OF 08/24/2009    Past Medical History:  Diagnosis Date   Allergy    Anxiety    Arthritis    Asthma    Back pain    Cataract    both eyes  surgically repaired   COPD (chronic obstructive pulmonary disease) ([place])    Depression    treated   Dyspnea    Fibrocystic breast disease    GERD (gastroesophageal reflux disease)    History of hiatal hernia    repaired   History of kidney stones    questionable   Hyperlipidemia    Hypertension    pt denies   Hypothyroidism    Irritable bowel syndrome    Joint pain    Osteoarthritis    Ovarian cyst    Palpitations    Pancreatitis    PONV (postoperative nausea and vomiting)    NO TROUBLE  WITH CATARACT SURGERY   Psoriasis    Psoriatic arthritis ([place]) 05/12/2016   Poor response to Humira Inadequate response to Enbrel Inadequate response to Simponi   Shortness of breath    Tachycardia    Ulcerative colitis     Family History  Problem Relation Age of Onset   Breast cancer Mother 48   Heart disease Mother    Colon polyps Mother    Other Mother        pre-cancerous tumor removed   Colon cancer Mother     Hypertension Mother    Hyperlipidemia Mother    Cancer Mother    Depression Mother    Heart disease Father    Hypertension Father    Hyperlipidemia Father    Stroke Father    Alcohol abuse Father    Crohn's disease Sister    Alcoholism Sister    Irritable bowel syndrome Other    Crohn's disease Daughter    Breast cancer Paternal Aunt    Breast cancer Maternal Grandmother    Colon polyps Paternal Grandmother    Esophageal cancer Neg Hx    Rectal cancer Neg Hx    Stomach cancer Neg Hx    Pancreatic cancer Neg Hx    Past Surgical History:  Procedure Laterality Date   ABDOMINAL HYSTERECTOMY     ANKLE RECONSTRUCTION     left   bladder tuck     CATARACT EXTRACTION W/PHACO Right 09/11/2017   Procedure: CATARACT EXTRACTION PHACO AND INTRAOCULAR LENS PLACEMENT ([place]);  Surgeon: Birder Robson, MD;  Location: [facility];  Service: Ophthalmology;  Laterality: Right;  [place] 00:18.5 AP% 6.9 CDE 1.29 Fluid Pack Lot # [id number]   CATARACT EXTRACTION W/PHACO Left 10/02/2017   Procedure: CATARACT EXTRACTION PHACO AND INTRAOCULAR LENS PLACEMENT ([facility]);  Surgeon: Birder Robson, MD;  Location: [facility];  Service: Ophthalmology;  Laterality: Left;  [place] 00:25.9 AP% 6.9 CDE 1.80 Fluid Pack Lot # [id number]   CLOSED REDUCTION NASAL FRACTURE N/A 05/21/2018   Procedure: CLOSED REDUCTION NASAL FRACTURE;  Surgeon: Clyde Canterbury, MD;  Location: [facility];  Service: ENT;  Laterality: N/A;  Latex sensitivity   COLONOSCOPY     EYE SURGERY     Lasik   FRACTURE SURGERY     KNEE SURGERY     left   LAPAROSCOPIC NISSEN FUNDOPLICATION N/A 6/81/2751   Procedure: LAPAROSCOPIC NISSEN TAKEDOWN AND UPPER ENDOSCOPY;  Surgeon: Johnathan Hausen, MD;  Location: [facility];  Service: General;  Laterality: N/A;   LEFT HEART CATH AND CORONARY ANGIOGRAPHY N/A 05/10/2020   Procedure: LEFT HEART CATH AND CORONARY ANGIOGRAPHY;  Surgeon: Belva Crome, MD;  Location: [facility];  Service: Cardiovascular;   Laterality: N/A;   NASAL SEPTUM SURGERY     NISSEN FUNDOPLICATION     PARTIAL HYSTERECTOMY     POLYPECTOMY     repair cystocele and rectocele     thyroid ablation     TUBAL LIGATION     UPPER GASTROINTESTINAL ENDOSCOPY     UPPER GI ENDOSCOPY  01/12/2017   Procedure: UPPER GI ENDOSCOPY;  Surgeon: Johnathan Hausen, MD;  Location: [facility];  Service: General;;   Social History   Social History Narrative   Not on file   Immunization History  Administered Date(s) Administered   Influenza Inj Mdck Quad Pf 05/08/2017   Influenza,inj,Quad PF,6+ Mos 03/08/2018, 03/29/2019, 06/25/2019   Influenza-Unspecified 03/31/2020   PFIZER(Purple Top)SARS-COV-2 Vaccination 09/28/2019, 10/20/2019, 03/02/2020     Objective: Vital Signs: There were no  vitals taken for this visit.   Physical Exam   Musculoskeletal Exam: ***  CDAI Exam: CDAI Score: -- Patient Global: --; Provider Global: -- Swollen: --; Tender: -- Joint Exam 01/31/2021   No joint exam has been documented for this visit   There is currently no information documented on the homunculus. Go to the Rheumatology activity and complete the homunculus joint exam.  Investigation: No additional findings.  Imaging: No results found.  Recent Labs: Lab Results  Component Value Date   WBC 5.7 11/12/2020   HGB 13.9 11/12/2020   PLT 293 11/12/2020   NA 141 11/12/2020   K 4.5 11/12/2020   CL 100 11/12/2020   CO2 25 11/12/2020   GLUCOSE 141 (H) 11/12/2020   BUN 16 11/12/2020   CREATININE 0.83 11/12/2020   BILITOT 0.5 11/12/2020   ALKPHOS 67 11/12/2020   AST 28 11/12/2020   ALT 22 11/12/2020   PROT 7.7 11/12/2020   ALBUMIN 4.4 11/12/2020   CALCIUM 9.7 11/12/2020   GFRAA 86 08/10/2020   QFTBGOLDPLUS Negative 11/12/2020    Speciality Comments: Inadequate response to Enbrel, Humira, Simponi,  Remicade-allergic response  Procedures:  No procedures performed Allergies: Demerol [meperidine], Imuran [azathioprine], Meperidine hcl,  Remicade [infliximab], Sulfasalazine, Avelox [moxifloxacin], Doxycycline, Erythromycin, and Latex   Assessment / Plan:     Visit Diagnoses: No diagnosis found.  Orders: No orders of the defined types were placed in this encounter.  No orders of the defined types were placed in this encounter.   Face-to-face time spent with patient was *** minutes. Greater than 50% of time was spent in counseling and coordination of care.  Follow-Up Instructions: No follow-ups on file.   Earnestine Mealing, CMA  Note - This record has been created using Editor, commissioning.  Chart creation errors have been sought, but may not always  have been located. Such creation errors do not reflect on  the standard of medical care.

## 2021-01-31 ENCOUNTER — Ambulatory Visit: Payer: Medicare HMO | Admitting: Physician Assistant

## 2021-02-01 ENCOUNTER — Encounter: Payer: Self-pay | Admitting: Physician Assistant

## 2021-02-01 ENCOUNTER — Ambulatory Visit: Payer: Medicare HMO | Admitting: Physician Assistant

## 2021-02-01 ENCOUNTER — Other Ambulatory Visit: Payer: Self-pay

## 2021-02-01 VITALS — BP 138/84 | HR 67 | Ht 66.0 in | Wt 213.2 lb

## 2021-02-01 DIAGNOSIS — Z8719 Personal history of other diseases of the digestive system: Secondary | ICD-10-CM

## 2021-02-01 DIAGNOSIS — E559 Vitamin D deficiency, unspecified: Secondary | ICD-10-CM

## 2021-02-01 DIAGNOSIS — L409 Psoriasis, unspecified: Secondary | ICD-10-CM | POA: Diagnosis not present

## 2021-02-01 DIAGNOSIS — L4050 Arthropathic psoriasis, unspecified: Secondary | ICD-10-CM

## 2021-02-01 DIAGNOSIS — Z87891 Personal history of nicotine dependence: Secondary | ICD-10-CM

## 2021-02-01 DIAGNOSIS — M5136 Other intervertebral disc degeneration, lumbar region: Secondary | ICD-10-CM | POA: Diagnosis not present

## 2021-02-01 DIAGNOSIS — M7661 Achilles tendinitis, right leg: Secondary | ICD-10-CM | POA: Diagnosis not present

## 2021-02-01 DIAGNOSIS — M170 Bilateral primary osteoarthritis of knee: Secondary | ICD-10-CM | POA: Diagnosis not present

## 2021-02-01 DIAGNOSIS — M7662 Achilles tendinitis, left leg: Secondary | ICD-10-CM

## 2021-02-01 DIAGNOSIS — Z79899 Other long term (current) drug therapy: Secondary | ICD-10-CM | POA: Diagnosis not present

## 2021-02-01 DIAGNOSIS — Z8679 Personal history of other diseases of the circulatory system: Secondary | ICD-10-CM | POA: Diagnosis not present

## 2021-02-01 DIAGNOSIS — M51369 Other intervertebral disc degeneration, lumbar region without mention of lumbar back pain or lower extremity pain: Secondary | ICD-10-CM

## 2021-02-01 LAB — COMPLETE METABOLIC PANEL WITH GFR
AG Ratio: 1.3 (calc) (ref 1.0–2.5)
ALT: 15 U/L (ref 6–29)
AST: 21 U/L (ref 10–35)
Albumin: 3.9 g/dL (ref 3.6–5.1)
Alkaline phosphatase (APISO): 49 U/L (ref 37–153)
BUN: 11 mg/dL (ref 7–25)
CO2: 31 mmol/L (ref 20–32)
Calcium: 9 mg/dL (ref 8.6–10.4)
Chloride: 101 mmol/L (ref 98–110)
Creat: 0.91 mg/dL (ref 0.50–1.03)
Globulin: 2.9 g/dL (calc) (ref 1.9–3.7)
Glucose, Bld: 89 mg/dL (ref 65–99)
Potassium: 4.5 mmol/L (ref 3.5–5.3)
Sodium: 138 mmol/L (ref 135–146)
Total Bilirubin: 0.5 mg/dL (ref 0.2–1.2)
Total Protein: 6.8 g/dL (ref 6.1–8.1)
eGFR: 75 mL/min/{1.73_m2} (ref >=60)

## 2021-02-01 LAB — CBC WITH DIFFERENTIAL/PLATELET
Absolute Monocytes: 570 cells/uL (ref 200–950)
Basophils Absolute: 40 cells/uL (ref 0–200)
Basophils Relative: 0.7 %
Eosinophils Absolute: 68 cells/uL (ref 15–500)
Eosinophils Relative: 1.2 %
HCT: 40 % (ref 35.0–45.0)
Hemoglobin: 13.3 g/dL (ref 11.7–15.5)
Lymphs Abs: 1978 cells/uL (ref 850–3900)
MCH: 32.1 pg (ref 27.0–33.0)
MCHC: 33.3 g/dL (ref 32.0–36.0)
MCV: 96.6 fL (ref 80.0–100.0)
MPV: 10.6 fL (ref 7.5–12.5)
Monocytes Relative: 10 %
Neutro Abs: 3044 cells/uL (ref 1500–7800)
Neutrophils Relative %: 53.4 %
Platelets: 261 10*3/uL (ref 140–400)
RBC: 4.14 10*6/uL (ref 3.80–5.10)
RDW: 12.7 % (ref 11.0–15.0)
Total Lymphocyte: 34.7 %
WBC: 5.7 10*3/uL (ref 3.8–10.8)

## 2021-02-01 NOTE — Progress Notes (Signed)
Office Visit Note  Patient: [name]             Date of Birth: 07-22-1965           MRN: [id number]             PCP: Mayra Neer, MD Referring: Mayra Neer, MD Visit Date: 02/01/2021 Occupation: @GUAROCC @  Subjective:  Medication monitoring   History of Present Illness: [name] is a 55 y.o. female with history of psoriatic arthritis, osteoarthritis, and DDD. She takes xeljanz 5 mg 1 tablet by mouth twice daily, methotrexate 0.6 ml sq injections once weekly, and folic acid 2 mg daily.  She is tolerating these medications without any side effects and has not missed any doses recently.  She denies any recent psoriatic arthritis flares.  She continues to have intermittent discomfort in her left knee especially after standing for prolonged periods of time or going up steps.  She denies any swelling or warmth in her left knee at this time.  She has occasional discomfort in her left thumb but overall has not noticed any decreased grip strength.  She has intermittent left SI joint discomfort but has not had any symptoms of sciatica.  She has occasional symptoms of plantar fasciitis and Achilles tendinitis on the left but but has not had any symptoms in her right foot.  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  She uses clobetasol cream topically as needed.  She denies any recent ulcerative colitis flares.  Her last colonoscopy was on 02/23/2020.  She has been seeing her gastroenterologist on a yearly basis. She states she was diagnosed with COVID-19 in May 2022.  She states that she did not hold Xeljanz or methotrexate during that time.  Her COVID-19 symptoms were mild.    Activities of Daily Living:  Patient reports morning stiffness for 25-30 minutes.   Patient Denies nocturnal pain.  Difficulty dressing/grooming: Denies Difficulty climbing stairs: Reports Difficulty getting  out of chair: Denies Difficulty using hands for taps, buttons, cutlery, and/or writing: Reports  Review of Systems  Constitutional:  Positive for fatigue.  HENT:  Negative for mouth sores, mouth dryness and nose dryness.   Eyes:  Negative for pain, itching and dryness.  Respiratory:  Positive for shortness of breath. Negative for difficulty breathing.        With exertion   Cardiovascular:  Negative for chest pain and palpitations.  Gastrointestinal:  Negative for blood in stool, constipation and diarrhea.  Endocrine: Negative for increased urination.  Genitourinary:  Negative for difficulty urinating.  Musculoskeletal:  Positive for joint pain, joint pain, myalgias, morning stiffness, muscle tenderness and myalgias. Negative for joint swelling.  Skin:  Positive for rash. Negative for color change.  Allergic/Immunologic: Negative for susceptible to infections.  Neurological:  Negative for dizziness, numbness, headaches, memory loss and weakness.  Hematological:  Negative for bruising/bleeding tendency.  Psychiatric/Behavioral:  Negative for confusion.    PMFS History:  Patient Active Problem List   Diagnosis Date Noted   Coronary artery disease involving native coronary artery of native heart without angina pectoris 06/14/2020   Aortic atherosclerosis ([place]) 05/06/2020   Family history of bicuspid aortic valve 05/06/2020   Dyspnea on exertion 03/31/2020   Prediabetes 01/13/2020   Vitamin D deficiency 12/24/2019   Depression 12/24/2019   Morbid obesity ([facility]) 12/24/2019   Allergic rhinitis 04/19/2018   Obstructive  sleep apnea syndrome, mild 04/19/2018   COPD (chronic obstructive pulmonary disease) ([place]) 03/26/2018   Rectal bleeding 01/22/2018   LLQ abdominal pain 01/22/2018   Dysphagia 01/12/2017   Spondylosis of lumbar region without myelopathy or radiculopathy 08/11/2016   Primary osteoarthritis of both knees 08/11/2016   History of hypertension 08/11/2016   History of  ulcerative colitis 08/11/2016   History of IBS 08/11/2016   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   High risk medications (not anticoagulants) long-term use 05/13/2016   Knee pain, bilateral 05/13/2016   Anserine bursitis 05/13/2016   Chondromalacia patellae, left knee 05/13/2016   Chondromalacia patellae, right knee 05/13/2016   Psoriatic arthritis ([place]) 05/12/2016   UNSPECIFIED HYPOTHYROIDISM 08/24/2009   Other hyperlipidemia 08/24/2009   Essential hypertension 08/24/2009   GERD 08/24/2009   Ulcerative colitis ([place]) 08/24/2009   IRRITABLE BOWEL SYNDROME 08/24/2009   OVARIAN CYST 08/24/2009   PSORIASIS 08/24/2009   ARTHRITIS 08/24/2009   TACHYCARDIA, HX OF 08/24/2009   PANCREATITIS, HX OF 08/24/2009   FIBROCYSTIC BREAST DISEASE, HX OF 08/24/2009    Past Medical History:  Diagnosis Date   Allergy    Anxiety    Arthritis    Asthma    Back pain    Cataract    both eyes  surgically repaired   COPD (chronic obstructive pulmonary disease) ([place])    Depression    treated   Dyspnea    Fibrocystic breast disease    GERD (gastroesophageal reflux disease)    History of hiatal hernia    repaired   History of kidney stones    questionable   Hyperlipidemia    Hypertension    pt denies   Hypothyroidism    Irritable bowel syndrome    Joint pain    Osteoarthritis    Ovarian cyst    Palpitations    Pancreatitis    PONV (postoperative nausea and vomiting)    NO TROUBLE WITH CATARACT SURGERY   Psoriasis    Psoriatic arthritis ([place]) 05/12/2016   Poor response to Humira Inadequate response to Enbrel Inadequate response to Simponi   Shortness of breath    Tachycardia    Ulcerative colitis     Family History  Problem Relation Age of Onset   Breast cancer Mother 28   Heart disease Mother    Colon polyps Mother    Other Mother        pre-cancerous tumor removed   Colon cancer Mother    Hypertension Mother    Hyperlipidemia Mother    Cancer Mother    Depression Mother    Heart  disease Father    Hypertension Father    Hyperlipidemia Father    Stroke Father    Alcohol abuse Father    Crohn's disease Sister    Alcoholism Sister    Irritable bowel syndrome Other    Crohn's disease Daughter    Breast cancer Paternal Aunt    Breast cancer Maternal Grandmother    Colon polyps Paternal Grandmother    Esophageal cancer Neg Hx    Rectal cancer Neg Hx    Stomach cancer Neg Hx    Pancreatic cancer Neg Hx    Past Surgical History:  Procedure Laterality Date   ABDOMINAL HYSTERECTOMY     ANKLE RECONSTRUCTION     left   bladder tuck     CATARACT EXTRACTION W/PHACO Right 09/11/2017   Procedure: CATARACT EXTRACTION PHACO AND INTRAOCULAR LENS PLACEMENT ([place]);  Surgeon: Birder Robson, MD;  Location: [facility];  Service:  Ophthalmology;  Laterality: Right;  [place] 00:18.5 AP% 6.9 CDE 1.29 Fluid Pack Lot # [id number] H   CATARACT EXTRACTION W/PHACO Left 10/02/2017   Procedure: CATARACT EXTRACTION PHACO AND INTRAOCULAR LENS PLACEMENT ([facility]);  Surgeon: Birder Robson, MD;  Location: [facility];  Service: Ophthalmology;  Laterality: Left;  [place] 00:25.9 AP% 6.9 CDE 1.80 Fluid Pack Lot # [id number] H   CLOSED REDUCTION NASAL FRACTURE N/A 05/21/2018   Procedure: CLOSED REDUCTION NASAL FRACTURE;  Surgeon: Clyde Canterbury, MD;  Location: [facility];  Service: ENT;  Laterality: N/A;  Latex sensitivity   COLONOSCOPY     EYE SURGERY     Lasik   FRACTURE SURGERY     KNEE SURGERY     left   LAPAROSCOPIC NISSEN FUNDOPLICATION N/A 2/40/9735   Procedure: LAPAROSCOPIC NISSEN TAKEDOWN AND UPPER ENDOSCOPY;  Surgeon: Johnathan Hausen, MD;  Location: [facility];  Service: General;  Laterality: N/A;   LEFT HEART CATH AND CORONARY ANGIOGRAPHY N/A 05/10/2020   Procedure: LEFT HEART CATH AND CORONARY ANGIOGRAPHY;  Surgeon: Belva Crome, MD;  Location: [facility];  Service: Cardiovascular;  Laterality: N/A;   NASAL SEPTUM SURGERY     NISSEN FUNDOPLICATION     PARTIAL HYSTERECTOMY      POLYPECTOMY     repair cystocele and rectocele     thyroid ablation     TUBAL LIGATION     UPPER GASTROINTESTINAL ENDOSCOPY     UPPER GI ENDOSCOPY  01/12/2017   Procedure: UPPER GI ENDOSCOPY;  Surgeon: Johnathan Hausen, MD;  Location: [facility];  Service: General;;   Social History   Social History Narrative   Not on file   Immunization History  Administered Date(s) Administered   Influenza Inj Mdck Quad Pf 05/08/2017   Influenza,inj,Quad PF,6+ Mos 03/08/2018, 03/29/2019, 06/25/2019   Influenza-Unspecified 03/31/2020   PFIZER Comirnaty(Gray Top)Covid-19 Tri-Sucrose Vaccine 09/20/2020   PFIZER(Purple Top)SARS-COV-2 Vaccination 09/28/2019, 10/20/2019, 03/02/2020   Zoster Recombinat (Shingrix) 09/06/2020     Objective: Vital Signs: BP 138/84 (BP Location: Left Arm, Patient Position: Sitting, Cuff Size: Normal)   Pulse 67   Ht 5' 6"  (1.676 m)   Wt 213 lb 3.2 oz (96.7 kg)   BMI 34.41 kg/m    Physical Exam Vitals and nursing note reviewed.  Constitutional:      Appearance: She is well-developed.  HENT:     Head: Normocephalic and atraumatic.  Eyes:     Conjunctiva/sclera: Conjunctivae normal.  Pulmonary:     Effort: Pulmonary effort is normal.  Abdominal:     Palpations: Abdomen is soft.  Musculoskeletal:     Cervical back: Normal range of motion.  Skin:    General: Skin is warm and dry.     Capillary Refill: Capillary refill takes less than 2 seconds.     Comments: Evidence of pustular psoriasis on the plantar aspect of both feet.  Neurological:     Mental Status: She is alert and oriented to person, place, and time.  Psychiatric:        Behavior: Behavior normal.     Musculoskeletal Exam: C-spine, thoracic spine, lumbar spine good range of motion with no discomfort.  Tenderness over the left SI joint.  No midline spinal tenderness.  Shoulder joints, elbow joints, wrist joints, MCPs, PIPs, DIPs have good range of motion with no synovitis.  Mild PIP and DIP thickening  consistent with osteoarthritis of both hands noted.  Tenderness over the left CMC and first PIP joint.  Complete fist formation bilaterally.  Hip joints  have good range of motion with no discomfort.  Knee joints have good range of motion with no warmth or effusion.  Ankle joints have good range of motion with no tenderness or joint swelling.  Some tenderness along the Achilles tendon and plantar fascia of the left foot.  No tenderness over MTP joints.  CDAI Exam: CDAI Score: -- Patient Global: --; Provider Global: -- Swollen: --; Tender: -- Joint Exam 02/01/2021   No joint exam has been documented for this visit   There is currently no information documented on the homunculus. Go to the Rheumatology activity and complete the homunculus joint exam.  Investigation: No additional findings.  Imaging: No results found.  Recent Labs: Lab Results  Component Value Date   WBC 5.7 11/12/2020   HGB 13.9 11/12/2020   PLT 293 11/12/2020   NA 141 11/12/2020   K 4.5 11/12/2020   CL 100 11/12/2020   CO2 25 11/12/2020   GLUCOSE 141 (H) 11/12/2020   BUN 16 11/12/2020   CREATININE 0.83 11/12/2020   BILITOT 0.5 11/12/2020   ALKPHOS 67 11/12/2020   AST 28 11/12/2020   ALT 22 11/12/2020   PROT 7.7 11/12/2020   ALBUMIN 4.4 11/12/2020   CALCIUM 9.7 11/12/2020   GFRAA 86 08/10/2020   QFTBGOLDPLUS Negative 11/12/2020    Speciality Comments: Inadequate response to Enbrel, Humira, Simponi,  Remicade-allergic response  Procedures:  No procedures performed Allergies: Demerol [meperidine], Imuran [azathioprine], Meperidine hcl, Remicade [infliximab], Sulfasalazine, Avelox [moxifloxacin], Doxycycline, Erythromycin, and Latex    Assessment / Plan:     Visit Diagnoses: Psoriatic arthritis ([place]): She has no synovitis or dactylitis on examination today.  She has not had any recent psoriatic arthritis flares.  She is clinically doing well taking Xeljanz 5 mg 1 tablet by mouth twice daily, methotrexate  0.6 mL apiece injections once weekly, and folic acid 2 mg by mouth daily.  She continues to tolerate these medications without any side effects and has not missed any doses recently.  She has occasional discomfort in the left SI joint, left knee joint, and plantar fasciitis of the left foot.  She had no warmth or effusion of the left knee joint on examination today.  She has some tenderness over the left CMC joint and left first PIP joint but no inflammation was noted.  We discussed the importance of joint protection and muscle strengthening.  She has persistent pustular psoriasis on the plantar aspect of the both feet but has not developed any pustular psoriasis on the palmar aspect of her hands.  She has been using clobetasol cream topically as needed.  She will remain on Xeljanz, methotrexate, and folic acid as prescribed.  She was advised to notify us if she develops increased joint pain or joint swelling.  She will follow-up in the office in 5 months.  Association of heart disease with psoriatic arthritis was discussed. Need to monitor blood pressure, cholesterol, and to exercise 30-60 minutes on daily basis was discussed.   Psoriasis - Pustular psoriasis on plantar aspect of both feet.  She uses clobetasol cream topically as needed.  High risk medication use - Xeljanz 5 mg 1 tablet by mouth twice daily, methotrexate 0.6 ml sq once weekly, folic acid 2 mg po daily. CBC and CMP updated on 11/12/20.  She is due to update lab work.  Her next lab work will be due in October and every 3 months.  Standing orders for CBC and CMP are in place.  TB gold negative on  11/12/20.  Plan: CBC with Differential/Platelet, COMPLETE METABOLIC PANEL WITH GFR Discussed the black box warning associated with Jak Inhibitors including increased risk for malignancy and MACE.  She had a normal echocardiogram in 05/27/2020.  She underwent cardiopulmonary exercise testing on 05/04/2020: Exercise testing with gas exchange demonstrates  normal functional capacity when compared to match sedentary norms.  There is no clear cardiopulmonary abnormality noted. Lipid panel ordered on 09/16/2020 was reviewed today.  Patient remains on Crestor as prescribed. She was diagnosed with COVID-19 in May 2022 but she did not hold Xeljanz or methotrexate during that time.  Her symptoms were mild and have completely resolved.  Discussed the importance of holding Xeljanz and methotrexate if she develops signs or symptoms of an infection and to resume once the infection has completely cleared.  Primary osteoarthritis of both knees: She has occasional discomfort in the left knee joint especially after standing for prolonged periods of time or going up steps.  On examination today she has good range of motion in both knee joints with no discomfort.  No warmth or effusion was noted.  DDD (degenerative disc disease), lumbar: She has no midline spinal tenderness or symptoms of radiculopathy at this time.  Achilles tendonitis, bilateral: She has intermittent symptoms of Achilles tendinitis of the left foot.  She has been performing stretching exercises on a daily basis.  We discussed the importance of proper fitting shoes.  She remain on Xeljanz as prescribed.  History of ulcerative colitis - Followed by Dr. Silverio Decamp.  She has not had any signs or symptoms of an ulcerative colitis flare on the current treatment regimen.  Her last colonoscopy was on 02/23/2020.  She will remain on Xeljanz as prescribed.  She plans on continuing to follow-up with GI on a yearly basis.  Other medical conditions are listed as follows:  History of IBS  History of hypertension: Her blood pressure was 138/84 today in the office.  We discussed the importance of close blood pressure monitoring.  History of gastroesophageal reflux (GERD)  Vitamin D deficiency - She takes vitamin D 2,000 units daily.  Former smoker  Orders: Orders Placed This Encounter  Procedures   CBC with  Differential/Platelet   COMPLETE METABOLIC PANEL WITH GFR   No orders of the defined types were placed in this encounter.    Follow-Up Instructions: Return in about 5 months (around 07/04/2021) for Psoriatic arthritis, Osteoarthritis.   Ofilia Neas, PA-C  Note - This record has been created using Dragon software.  Chart creation errors have been sought, but may not always  have been located. Such creation errors do not reflect on  the standard of medical care.

## 2021-02-01 NOTE — Patient Instructions (Signed)
Association of heart disease with psoriatic arthritis was discussed. Need to monitor blood pressure, cholesterol, and to exercise 30-60 minutes on daily basis was discussed.    Standing Labs We placed an order today for your standing lab work.   Please have your standing labs drawn in October and every 3 months   If possible, please have your labs drawn 2 weeks prior to your appointment so that the provider can discuss your results at your appointment.  Please note that you may see your imaging and lab results in [place] before we have reviewed them. We may be awaiting multiple results to interpret others before contacting you. Please allow our office up to 72 hours to thoroughly review all of the results before contacting the office for clarification of your results.  We have open lab daily: Monday through Thursday from 1:30-4:30 PM and Friday from 1:30-4:00 PM at the office of Dr. Bo Merino, [facility].   Please be advised, all patients with office appointments requiring lab work will take precedent over walk-in lab work.  If possible, please come for your lab work on Monday and Friday afternoons, as you may experience shorter wait times. The office is located at [address], [place], [place], [place] [address] No appointment is necessary.   Labs are drawn by [facility]. Please bring your co-pay at the time of your lab draw.  You may receive a bill from [facility] for your lab work.  If you wish to have your labs drawn at another location, please call the office 24 hours in advance to send orders.  If you have any questions regarding directions or hours of operation,  please call [phone].   As a reminder, please drink plenty of water prior to coming for your lab work. Thanks!

## 2021-02-02 NOTE — Progress Notes (Signed)
CBC and CMP WNL

## 2021-02-17 ENCOUNTER — Other Ambulatory Visit: Payer: Self-pay | Admitting: Rheumatology

## 2021-02-18 NOTE — Telephone Encounter (Signed)
Next Visit: 07/05/2021  Last Visit: 02/01/2021  Last Fill: 09/06/2020  IV:[id number] arthritis  Current Dose per office note 02/01/2021: Morrie Sheldon 5 mg 1 tablet by mouth twice daily  Labs: 02/01/2021 CBC and CMP WNL  TB Gold: 11/12/2020 Neg    Okay to refill Morrie Sheldon?

## 2021-03-25 ENCOUNTER — Ambulatory Visit (INDEPENDENT_AMBULATORY_CARE_PROVIDER_SITE_OTHER)
Admission: RE | Admit: 2021-03-25 | Discharge: 2021-03-25 | Disposition: A | Discharge status: Home / Self Care | Payer: Medicare HMO | Source: Ambulatory Visit | Attending: Acute Care | Admitting: Acute Care

## 2021-03-25 ENCOUNTER — Other Ambulatory Visit: Payer: Self-pay

## 2021-03-25 DIAGNOSIS — Z87891 Personal history of nicotine dependence: Secondary | ICD-10-CM | POA: Diagnosis not present

## 2021-04-01 ENCOUNTER — Encounter: Payer: Self-pay | Admitting: *Deleted

## 2021-04-01 DIAGNOSIS — Z87891 Personal history of nicotine dependence: Secondary | ICD-10-CM

## 2021-04-14 DIAGNOSIS — Z6835 Body mass index (BMI) 35.0-35.9, adult: Secondary | ICD-10-CM | POA: Diagnosis not present

## 2021-04-14 DIAGNOSIS — E669 Obesity, unspecified: Secondary | ICD-10-CM | POA: Diagnosis not present

## 2021-04-19 ENCOUNTER — Ambulatory Visit: Payer: Medicare HMO | Admitting: Gastroenterology

## 2021-04-19 ENCOUNTER — Encounter: Payer: Self-pay | Admitting: Gastroenterology

## 2021-04-19 VITALS — BP 106/70 | HR 76 | Ht 66.0 in | Wt 211.0 lb

## 2021-04-19 DIAGNOSIS — K588 Other irritable bowel syndrome: Secondary | ICD-10-CM

## 2021-04-19 DIAGNOSIS — K5100 Ulcerative (chronic) pancolitis without complications: Secondary | ICD-10-CM | POA: Diagnosis not present

## 2021-04-19 DIAGNOSIS — K760 Fatty (change of) liver, not elsewhere classified: Secondary | ICD-10-CM | POA: Diagnosis not present

## 2021-04-19 DIAGNOSIS — K219 Gastro-esophageal reflux disease without esophagitis: Secondary | ICD-10-CM | POA: Diagnosis not present

## 2021-04-19 MED ORDER — DICYCLOMINE HCL 20 MG PO TABS: 20.0000 mg | ORAL_TABLET | ORAL | 1 refills | Status: DC | PRN

## 2021-04-19 NOTE — Patient Instructions (Addendum)
Follow up in 1 year   If you are age 55 or older, your body mass index should be between 23-30. Your Body mass index is 34.06 kg/m. If this is out of the aforementioned range listed, please consider follow up with your Primary Care Provider.  If you are age 11 or younger, your body mass index should be between 19-25. Your Body mass index is 34.06 kg/m. If this is out of the aformentioned range listed, please consider follow up with your Primary Care Provider.   __________________________________________________________  The [facility] providers would like to encourage you to use [facility] to communicate with providers for non-urgent requests or questions.  Due to long hold times on the telephone, sending your provider a message by [facility] may be a faster and more efficient way to get a response.  Please allow 48 business hours for a response.  Please remember that this is for non-urgent requests.    I appreciate the  opportunity to care for you  Thank You   Harl Bowie , MD

## 2021-04-19 NOTE — Progress Notes (Signed)
[name]    [id number]    [birth year]  Primary Care Physician:Shaw, Nathen May, MD  Referring Physician: Mayra Neer, MD [address],  [place] [address]   Chief complaint:  Ulcerative colitis  HPI: 55 year old very pleasant female with history of chronic ulcerative pancolitis here for follow-up visit Overall she is doing well. Denies any nausea, vomiting, abdominal pain, melena or bright red blood per rectum  She is on methotrexate and Xeljanz for psoriatic arthritis, which are also helping with ulcerative colitis.  Denies any recent flare of colitis  GI history: Ulcerative colitis diagnosed in 2002, colonoscopy 2011 showed pancolitis.  Colonoscopy December 2014 showed chronic active colitis from 0 to 30 cm and normal transverse and right colon. Colonoscopy Nov 14, 2017 negative for active colitis  Colonoscopy 02/23/20 - The perianal and digital rectal examinations were normal. - A 5 mm polyp [tubular adenoma] was found in the transverse colon. The polyp was sessile. The polyp was removed with a cold snare. Resection and retrieval were complete. - A localized area of mildly nodular mucosa was found in the rectum. Biopsies were taken with a cold forceps for histology. - Non-bleeding internal hemorrhoids were found during retroflexion. The hemorrhoids were medium-sized. - The exam was otherwise without abnormality.   Chronic GERD status post Nissen fundoplication EGD February 2011 negative for Barrett's esophagus    Outpatient Encounter Medications as of 04/19/2021  Medication Sig   aspirin-acetaminophen-caffeine (EXCEDRIN MIGRAINE) 250-250-65 MG tablet Take 2 tablets by mouth every 8 (eight) hours as needed for headache.    Cholecalciferol (VITAMIN D3) 50 MCG (2000 UT) CAPS Take 2,000 Units by mouth.   clobetasol cream (TEMOVATE) 1.74 % Apply 1 application topically daily as needed.   dicyclomine (BENTYL) 20 MG tablet Take 1 tablet  (20 mg total) by mouth 2 (two) times daily. (Patient taking differently: Take 20 mg by mouth as needed.)   escitalopram (LEXAPRO) 20 MG tablet Take 1 tablet (20 mg total) by mouth daily.   estradiol (ESTRACE) 1 MG tablet Take 1 mg by mouth daily.   fluticasone (FLONASE) 50 MCG/ACT nasal spray Place 1 spray into both nostrils daily.   folic acid (FOLVITE) 1 MG tablet Take 2 mg by mouth daily.   ibuprofen (ADVIL,MOTRIN) 200 MG tablet Take 400 mg by mouth every 6 (six) hours as needed for mild pain.    levocetirizine (XYZAL) 5 MG tablet Take 5 mg by mouth every evening.   levothyroxine (SYNTHROID) 125 MCG tablet Take 125 mcg by mouth every other day. Alternate with 137 mcg   levothyroxine (SYNTHROID) 137 MCG tablet Take 137 mcg by mouth every other day. Alternate with 125 mcg   methotrexate 50 MG/2ML injection INJECT 0.6ML (15MG) SUBCUTANEOUSLY ONE TIME WEEKLY. DISCARD VIAL 28 DAYS AFTER FIRST USE.   metoprolol succinate (TOPROL-XL) 50 MG 24 hr tablet Take 25 mg by mouth daily. Take with or immediately following a meal.   montelukast (SINGULAIR) 10 MG tablet Take 10 mg by mouth at bedtime.   omeprazole (PRILOSEC) 20 MG capsule TAKE 1 CAPSULE EVERY DAY BEFORE BREAKFAST   rosuvastatin (CRESTOR) 20 MG tablet Take 20 mg by mouth daily.   Tofacitinib Citrate (XELJANZ) 5 MG TABS Take 1 tablet by mouth twice a day   Tuberculin-Allergy Syringes 27G X 1/2" 1 ML KIT Inject 1 Syringe into the skin once a week. To be used with weekly methotrexate injections   nitroGLYCERIN (NITROSTAT) 0.4  MG SL tablet Place 1 tablet (0.4 mg total) under the tongue every 5 (five) minutes as needed for chest pain.   No facility-administered encounter medications on file as of 04/19/2021.    Allergies as of 04/19/2021 - Review Complete 04/19/2021  Allergen Reaction Noted   Demerol [meperidine] Hives 10/15/2019   Imuran [azathioprine] Other (See Comments) 08/24/2009   Meperidine hcl Hives 08/24/2009   Remicade [infliximab]  Hives and Other (See Comments) 01/26/2011   Sulfasalazine Swelling and Hives 08/04/2011   Avelox [moxifloxacin] Rash 08/24/2009   Doxycycline Rash    Erythromycin Rash 08/24/2009   Latex Rash 08/24/2009    Past Medical History:  Diagnosis Date   Allergy    Anxiety    Arthritis    Asthma    Back pain    Cataract    both eyes  surgically repaired   COPD (chronic obstructive pulmonary disease) ([place])    Depression    treated   Dyspnea    Fibrocystic breast disease    GERD (gastroesophageal reflux disease)    History of hiatal hernia    repaired   History of kidney stones    questionable   Hyperlipidemia    Hypertension    pt denies   Hypothyroidism    Irritable bowel syndrome    Joint pain    Osteoarthritis    Ovarian cyst    Palpitations    Pancreatitis    PONV (postoperative nausea and vomiting)    NO TROUBLE WITH CATARACT SURGERY   Psoriasis    Psoriatic arthritis ([place]) 05/12/2016   Poor response to Humira Inadequate response to Enbrel Inadequate response to Simponi   Shortness of breath    Tachycardia    Ulcerative colitis     Past Surgical History:  Procedure Laterality Date   ABDOMINAL HYSTERECTOMY     ANKLE RECONSTRUCTION     left   bladder tuck     CATARACT EXTRACTION W/PHACO Right 09/11/2017   Procedure: CATARACT EXTRACTION PHACO AND INTRAOCULAR LENS PLACEMENT ([place]);  Surgeon: Birder Robson, MD;  Location: [facility];  Service: Ophthalmology;  Laterality: Right;  [place] 00:18.5 AP% 6.9 CDE 1.29 Fluid Pack Lot # [id number]   CATARACT EXTRACTION W/PHACO Left 10/02/2017   Procedure: CATARACT EXTRACTION PHACO AND INTRAOCULAR LENS PLACEMENT ([facility]);  Surgeon: Birder Robson, MD;  Location: [facility];  Service: Ophthalmology;  Laterality: Left;  [place] 00:25.9 AP% 6.9 CDE 1.80 Fluid Pack Lot # [id number]   CLOSED REDUCTION NASAL FRACTURE N/A 05/21/2018   Procedure: CLOSED REDUCTION NASAL FRACTURE;  Surgeon: Clyde Canterbury, MD;  Location: [facility];  Service:  ENT;  Laterality: N/A;  Latex sensitivity   COLONOSCOPY     EYE SURGERY     Lasik   FRACTURE SURGERY     KNEE SURGERY     left   LAPAROSCOPIC NISSEN FUNDOPLICATION N/A 0/03/3817   Procedure: LAPAROSCOPIC NISSEN TAKEDOWN AND UPPER ENDOSCOPY;  Surgeon: Johnathan Hausen, MD;  Location: [facility];  Service: General;  Laterality: N/A;   LEFT HEART CATH AND CORONARY ANGIOGRAPHY N/A 05/10/2020   Procedure: LEFT HEART CATH AND CORONARY ANGIOGRAPHY;  Surgeon: Belva Crome, MD;  Location: [facility];  Service: Cardiovascular;  Laterality: N/A;   NASAL SEPTUM SURGERY     NISSEN FUNDOPLICATION     PARTIAL HYSTERECTOMY     POLYPECTOMY     repair cystocele and rectocele     thyroid ablation     TUBAL LIGATION     UPPER GASTROINTESTINAL ENDOSCOPY  UPPER GI ENDOSCOPY  01/12/2017   Procedure: UPPER GI ENDOSCOPY;  Surgeon: Johnathan Hausen, MD;  Location: [facility];  Service: General;;    Family History  Problem Relation Age of Onset   Breast cancer Mother 15   Heart disease Mother    Colon polyps Mother    Other Mother        pre-cancerous tumor removed   Colon cancer Mother    Hypertension Mother    Hyperlipidemia Mother    Cancer Mother    Depression Mother    Heart disease Father    Hypertension Father    Hyperlipidemia Father    Stroke Father    Alcohol abuse Father    Crohn's disease Sister    Alcoholism Sister    Irritable bowel syndrome Other    Crohn's disease Daughter    Breast cancer Paternal Aunt    Breast cancer Maternal Grandmother    Colon polyps Paternal Grandmother    Esophageal cancer Neg Hx    Rectal cancer Neg Hx    Stomach cancer Neg Hx    Pancreatic cancer Neg Hx     Social History   Socioeconomic History   Marital status: Married    Spouse name: Not on file   Number of children: Not on file   Years of education: Not on file   Highest education level: Not on file  Occupational History   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[employer], 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    Employer:  [employer]   Occupation: disabled  Tobacco Use   Smoking status: Former    Packs/day: 1.00    Years: 37.50    Pack years: 37.50    Types: Cigarettes    Quit date: 07/27/2009    Years since quitting: 11.7   Smokeless tobacco: Never  Vaping Use   Vaping Use: Never used  Substance and Sexual Activity   Alcohol use: Yes    Alcohol/week: 0.0 standard drinks    Comment:  occasionally (1x/mo)   Drug use: No   Sexual activity: Yes  Other Topics Concern   Not on file  Social History Narrative   Not on file   Social Determinants of Health   Financial Resource Strain: Not on file  Food Insecurity: Not on file  Transportation Needs: Not on file  Physical Activity: Not on file  Stress: Not on file  Social Connections: Not on file  Intimate Partner Violence: Not on file      Review of systems: All other review of systems negative except as mentioned in the HPI.   Physical Exam: Vitals:   04/19/21 0809  BP: 106/70  Pulse: 76   Body mass index is 34.06 kg/m. Gen:      No acute distress HEENT:  sclera anicteric Abd:      soft, non-tender; no palpable masses, no distension Ext:    No edema Neuro: alert and oriented x 3 Psych: normal mood and affect  Data Reviewed:  Reviewed labs, radiology imaging, old records and pertinent past GI work up   Assessment and Plan/Recommendations:  55 year old female with history of psoriasis, psoriatic arthritis, pancolonic ulcerative colitis in clinical remission on Xeljanz and weekly methotrexate She is doing well overall Reviewed recent labs in epic, within normal limits Continue current regimen along with multivitamin, Y04 and folic acid She is upto date with IBD health maintenance   Fatty liver on imaging: LFT within normal limits Discussed dietary changes and exercise Avoid high carb and high fat diet  Recall colonoscopy for surveillance due to history of pan ulcerative colitis and adenomatous colon polyps in 02/2023    Use dicyclomine as needed for IBS symptoms and abdominal cramping  GERD: Continue omeprazole and antireflux measures   Return in 1 year or sooner if needed  The patient was provided an opportunity to ask questions and all were answered. The patient agreed with the plan and demonstrated an understanding of the instructions.  Damaris Hippo , MD    CC: Mayra Neer, MD

## 2021-04-26 ENCOUNTER — Encounter: Payer: Self-pay | Admitting: Emergency Medicine

## 2021-04-26 ENCOUNTER — Other Ambulatory Visit: Payer: Self-pay

## 2021-04-26 ENCOUNTER — Ambulatory Visit: Payer: Medicare HMO | Admitting: Emergency Medicine

## 2021-04-26 DIAGNOSIS — J301 Allergic rhinitis due to pollen: Secondary | ICD-10-CM | POA: Diagnosis not present

## 2021-04-26 DIAGNOSIS — J449 Chronic obstructive pulmonary disease, unspecified: Secondary | ICD-10-CM

## 2021-04-26 DIAGNOSIS — Z87891 Personal history of nicotine dependence: Secondary | ICD-10-CM | POA: Diagnosis not present

## 2021-04-26 MED ORDER — ALBUTEROL SULFATE HFA 108 (90 BASE) MCG/ACT IN AERS: 2.0000 | INHALATION_SPRAY | Freq: Four times a day (QID) | RESPIRATORY_TRACT | 8 refills | Status: DC | PRN

## 2021-04-26 NOTE — Assessment & Plan Note (Signed)
Please keep your albuterol available to use 2 puffs when needed for shortness of breath, chest tightness, wheezing. If your albuterol use increases, shortness of breath increases then please call to let us know.  We can discuss possibly starting an every day scheduled inhaled medicine. Flu shot up-to-date.  Get the new COVID-19 booster this fall Follow with Dr. Lamonte Sakai in 12 months or sooner if you have any problems.

## 2021-04-26 NOTE — Assessment & Plan Note (Signed)
Continue your Xyzal, Singulair, fluticasone nasal spray as you have been taking them.

## 2021-04-26 NOTE — Addendum Note (Signed)
Addended by: Fran Lowes on: 04/26/2021 10:45 AM   Modules accepted: Orders

## 2021-04-26 NOTE — Assessment & Plan Note (Signed)
We reviewed your CT scan of the chest today.  This was reassuring.  You will need another lung cancer screening CT 03/2022 and we will arrange for this.

## 2021-04-26 NOTE — Patient Instructions (Addendum)
Please keep your albuterol available to use 2 puffs when needed for shortness of breath, chest tightness, wheezing. If your albuterol use increases, shortness of breath increases then please call to let us know.  We can discuss possibly starting an every day scheduled inhaled medicine. Flu shot up-to-date.  Get the new COVID-19 booster this fall Continue your Xyzal, Singulair, fluticasone nasal spray as you have been taking them. We reviewed your CT scan of the chest today.  This was reassuring.  You will need another lung cancer screening CT 03/2022 and we will arrange for this. Follow with Dr. Lamonte Sakai in 12 months or sooner if you have any problems.

## 2021-04-26 NOTE — Progress Notes (Signed)
Subjective:    Patient ID: [name], female    DOB: Oct 21, 1965, 55 y.o.   MRN: [id number]  HPI  ROV 09/23/2018 --55 year old woman who follows up today for her history of former tobacco, COPD, new diagnosis of mild OSA (November 2019).  She also has psoriatic arthritis on methotrexate and Xeljanz, ulcerative colitis.  Finally she has allergic rhinitis for which she has been treated with Singulair, Xyzal, fluticasone nasal spray.  Chronic cough due to this and also reflux on omeprazole.  We tried her on CPAP to see if she can tolerate, get benefit.  She reports that she had difficulty with both nasal pillows and full face mask. She could not tolerate, gave it back. She has been pre-treating exercise w albuterol, uses prn about 1-2x a month. She can occasionally hear wheeze, again w exercise.   ROV 03/31/20 --55 year old with COPD and mild obstructive sleep apnea, psoriatic arthritis and ulcerative colitis on methotrexate and Xeljanz, allergic rhinitis and GERD with associated chronic cough.  We tried her on CPAP but she had difficulty tolerating and gave it back.  Currently managed on Flonase, Xyzal, Singulair, Prilosec.  She is on Wixella reliably. We did a trial of Spiriva Respimat but she wasn't sure that it did much.  She uses albuterol without much effect. She is working on diet >> has lost 17 lbs  LDCT 03/24/20 was RADS 2, no evidence ILD  ROV 04/26/21 --annual follow-up visit 55 year old woman with a history of COPD and mild OSA (could not tolerate CPAP), some superimposed restrictive disease due to obesity.  She also has psoriatic arthritis, ultra colitis treated with methotrexate and Morrie Sheldon.  She deals with chronic cough in the setting of the above as well as allergic rhinitis, GERD.  She has been managed on Wixela but we stopped it last year to see if she would tolerate - she still uses albuterol with heavier exertion, does benefit from it, helps her breathing.  Also on fluticasone nasal  spray, Xyzal, Singulair. Allergies still can be active. Coughs up mucous in the am, then OK through the day.  She had cardiac evaluation with left heart cath 05/27/2020 that showed noncritical CAD, being treated medically.  Lung cancer screening CT 03/25/2021 reviewed by me, shows emphysematous change, small pleural right middle lobe lipoma, other small stable nodules, no suspicious findings (RADS 2 study).   Review of Systems  Constitutional:  Negative for fever and unexpected weight change.  HENT:  Positive for postnasal drip. Negative for congestion, dental problem, ear pain, nosebleeds, rhinorrhea, sinus pressure, sneezing and trouble swallowing.   Eyes:  Negative for redness and itching.  Respiratory:  Positive for shortness of breath and wheezing. Negative for cough and chest tightness.   Cardiovascular:  Negative for palpitations and leg swelling.  Gastrointestinal:  Negative for nausea and vomiting.  Genitourinary:  Negative for dysuria.  Musculoskeletal:  Negative for joint swelling.  Skin:  Negative for rash.  Allergic/Immunologic: Negative.  Negative for environmental allergies, food allergies and immunocompromised state.  Neurological:  Negative for headaches.  Hematological:  Does not bruise/bleed easily.  Psychiatric/Behavioral:  Negative for dysphoric mood. The patient is not nervous/anxious.        Objective:   Physical Exam Vitals:   04/26/21 1007  BP: 112/64  Pulse: 82  Temp: 98.3 F (36.8 C)  TempSrc: Oral  SpO2: 98%  Weight: 206 lb (93.4 kg)  Height: 5' 6"  (1.676 m)   Gen: Pleasant, overwt woman, in no distress,  normal affect  ENT: No lesions,  mouth clear,  oropharynx clear, no postnasal drip  Neck: No JVD, no stridor  Lungs: No use of accessory muscles, no wheeze, decreased at bases  Cardiovascular: RRR, heart sounds normal, no murmur or gallops, no peripheral edema  Musculoskeletal: No deformities, no cyanosis or clubbing  Neuro: alert, non  focal  Skin: Warm, no lesions or rash      Assessment & Plan:  COPD (chronic obstructive pulmonary disease) ([place]) Please keep your albuterol available to use 2 puffs when needed for shortness of breath, chest tightness, wheezing. If your albuterol use increases, shortness of breath increases then please call to let us know.  We can discuss possibly starting an every day scheduled inhaled medicine. Flu shot up-to-date.  Get the new COVID-19 booster this fall Follow with Dr. Lamonte Sakai in 12 months or sooner if you have any problems.   Allergic rhinitis Continue your Xyzal, Singulair, fluticasone nasal spray as you have been taking them.  History of tobacco use We reviewed your CT scan of the chest today.  This was reassuring.  You will need another lung cancer screening CT 03/2022 and we will arrange for this.  Baltazar Apo, MD, PhD 04/26/2021, 10:37 AM [facility] [phone] or if no answer [phone]

## 2021-04-27 DIAGNOSIS — L4050 Arthropathic psoriasis, unspecified: Secondary | ICD-10-CM | POA: Diagnosis not present

## 2021-04-27 DIAGNOSIS — E7800 Pure hypercholesterolemia, unspecified: Secondary | ICD-10-CM | POA: Diagnosis not present

## 2021-04-27 DIAGNOSIS — I2510 Atherosclerotic heart disease of native coronary artery without angina pectoris: Secondary | ICD-10-CM | POA: Diagnosis not present

## 2021-04-27 DIAGNOSIS — E669 Obesity, unspecified: Secondary | ICD-10-CM | POA: Diagnosis not present

## 2021-04-27 DIAGNOSIS — K5190 Ulcerative colitis, unspecified, without complications: Secondary | ICD-10-CM | POA: Diagnosis not present

## 2021-04-27 DIAGNOSIS — F322 Major depressive disorder, single episode, severe without psychotic features: Secondary | ICD-10-CM | POA: Diagnosis not present

## 2021-04-27 DIAGNOSIS — Z0000 Encounter for general adult medical examination without abnormal findings: Secondary | ICD-10-CM | POA: Diagnosis not present

## 2021-04-27 DIAGNOSIS — R7303 Prediabetes: Secondary | ICD-10-CM | POA: Diagnosis not present

## 2021-04-27 DIAGNOSIS — J449 Chronic obstructive pulmonary disease, unspecified: Secondary | ICD-10-CM | POA: Diagnosis not present

## 2021-04-27 DIAGNOSIS — I10 Essential (primary) hypertension: Secondary | ICD-10-CM | POA: Diagnosis not present

## 2021-04-27 DIAGNOSIS — E039 Hypothyroidism, unspecified: Secondary | ICD-10-CM | POA: Diagnosis not present

## 2021-05-17 ENCOUNTER — Other Ambulatory Visit: Payer: Self-pay | Admitting: *Deleted

## 2021-05-17 ENCOUNTER — Telehealth: Payer: Self-pay

## 2021-05-17 DIAGNOSIS — Z79899 Other long term (current) drug therapy: Secondary | ICD-10-CM

## 2021-05-17 NOTE — Telephone Encounter (Signed)
Patient called requesting her labwork orders be sent to [facility] in [place].  Patient plans to go tomorrow 05/18/21.

## 2021-05-17 NOTE — Telephone Encounter (Signed)
Lab Orders released.  

## 2021-05-18 DIAGNOSIS — E669 Obesity, unspecified: Secondary | ICD-10-CM | POA: Diagnosis not present

## 2021-05-18 DIAGNOSIS — Z79899 Other long term (current) drug therapy: Secondary | ICD-10-CM | POA: Diagnosis not present

## 2021-05-19 LAB — CMP14+EGFR
ALT: 29 IU/L (ref 0–32)
AST: 34 IU/L (ref 0–40)
Albumin/Globulin Ratio: 1.4 (ref 1.2–2.2)
Albumin: 4.2 g/dL (ref 3.8–4.9)
Alkaline Phosphatase: 59 IU/L (ref 44–121)
BUN/Creatinine Ratio: 17 (ref 9–23)
BUN: 16 mg/dL (ref 6–24)
Bilirubin Total: 0.5 mg/dL (ref 0.0–1.2)
CO2: 26 mmol/L (ref 20–29)
Calcium: 9.6 mg/dL (ref 8.7–10.2)
Chloride: 102 mmol/L (ref 96–106)
Creatinine, Ser: 0.96 mg/dL (ref 0.57–1.00)
Globulin, Total: 3.1 g/dL (ref 1.5–4.5)
Glucose: 101 mg/dL — ABNORMAL HIGH (ref 70–99)
Potassium: 4.6 mmol/L (ref 3.5–5.2)
Sodium: 142 mmol/L (ref 134–144)
Total Protein: 7.3 g/dL (ref 6.0–8.5)
eGFR: 70 mL/min/{1.73_m2} (ref >59)

## 2021-05-19 LAB — CBC WITH DIFFERENTIAL/PLATELET
Basophils Absolute: 0 10*3/uL (ref 0.0–0.2)
Basos: 1 %
EOS (ABSOLUTE): 0 10*3/uL (ref 0.0–0.4)
Eos: 1 %
Hematocrit: 40 % (ref 34.0–46.6)
Hemoglobin: 13.2 g/dL (ref 11.1–15.9)
Immature Grans (Abs): 0 10*3/uL (ref 0.0–0.1)
Immature Granulocytes: 0 %
Lymphocytes Absolute: 2.6 10*3/uL (ref 0.7–3.1)
Lymphs: 45 %
MCH: 31.4 pg (ref 26.6–33.0)
MCHC: 33 g/dL (ref 31.5–35.7)
MCV: 95 fL (ref 79–97)
Monocytes Absolute: 0.5 10*3/uL (ref 0.1–0.9)
Monocytes: 9 %
Neutrophils Absolute: 2.5 10*3/uL (ref 1.4–7.0)
Neutrophils: 44 %
Platelets: 263 10*3/uL (ref 150–450)
RBC: 4.2 x10E6/uL (ref 3.77–5.28)
RDW: 12.3 % (ref 11.7–15.4)
WBC: 5.6 10*3/uL (ref 3.4–10.8)

## 2021-05-19 NOTE — Progress Notes (Signed)
CBC and CMP normal.

## 2021-06-12 DIAGNOSIS — J069 Acute upper respiratory infection, unspecified: Secondary | ICD-10-CM | POA: Diagnosis not present

## 2021-06-12 DIAGNOSIS — R6883 Chills (without fever): Secondary | ICD-10-CM | POA: Diagnosis not present

## 2021-06-21 NOTE — Progress Notes (Deleted)
Office Visit Note  Patient: [name]             Date of Birth: 1965/10/26           MRN: [id number]             PCP: Mayra Neer, MD Referring: Mayra Neer, MD Visit Date: 07/05/2021 Occupation: @GUAROCC @  Subjective:  No chief complaint on file.   History of Present Illness: [name] is a 55 y.o. female ***   Activities of Daily Living:  Patient reports morning stiffness for *** {minute/hour:19697}.   Patient {ACTIONS;DENIES/REPORTS:21021675::"Denies"} nocturnal pain.  Difficulty dressing/grooming: {ACTIONS;DENIES/REPORTS:21021675::"Denies"} Difficulty climbing stairs: {ACTIONS;DENIES/REPORTS:21021675::"Denies"} Difficulty getting out of chair: {ACTIONS;DENIES/REPORTS:21021675::"Denies"} Difficulty using hands for taps, buttons, cutlery, and/or writing: {ACTIONS;DENIES/REPORTS:21021675::"Denies"}  No Rheumatology ROS completed.   PMFS History:  Patient Active Problem List   Diagnosis Date Noted   Coronary artery disease involving native coronary artery of native heart without angina pectoris 06/14/2020   Aortic atherosclerosis ([place]) 05/06/2020   Family history of bicuspid aortic valve 05/06/2020   Dyspnea on exertion 03/31/2020   Prediabetes 01/13/2020   Vitamin D deficiency 12/24/2019   Depression 12/24/2019   Morbid obesity ([place]) 12/24/2019   History of tobacco use 08/06/2019   Allergic rhinitis 04/19/2018   Obstructive sleep apnea syndrome, mild 04/19/2018   COPD (chronic obstructive pulmonary disease) ([place]) 03/26/2018   Rectal bleeding 01/22/2018   LLQ abdominal pain 01/22/2018   Dysphagia 01/12/2017   Spondylosis of lumbar region without myelopathy or radiculopathy 08/11/2016   Primary osteoarthritis of both knees 08/11/2016   History of hypertension 08/11/2016   History of ulcerative colitis 08/11/2016   History of IBS 08/11/2016   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   High risk medications (not anticoagulants) long-term use 05/13/2016   Knee  pain, bilateral 05/13/2016   Anserine bursitis 05/13/2016   Chondromalacia patellae, left knee 05/13/2016   Chondromalacia patellae, right knee 05/13/2016   Psoriatic arthritis ([place]) 05/12/2016   UNSPECIFIED HYPOTHYROIDISM 08/24/2009   Other hyperlipidemia 08/24/2009   Essential hypertension 08/24/2009   GERD 08/24/2009   Ulcerative colitis ([place]) 08/24/2009   IRRITABLE BOWEL SYNDROME 08/24/2009   OVARIAN CYST 08/24/2009   PSORIASIS 08/24/2009   ARTHRITIS 08/24/2009   TACHYCARDIA, HX OF 08/24/2009   PANCREATITIS, HX OF 08/24/2009   FIBROCYSTIC BREAST DISEASE, HX OF 08/24/2009    Past Medical History:  Diagnosis Date   Allergy    Anxiety    Arthritis    Asthma    Back pain    Cataract    both eyes  surgically repaired   COPD (chronic obstructive pulmonary disease) ([place])    Depression    treated   Dyspnea    Fibrocystic breast disease    GERD (gastroesophageal reflux disease)    History of hiatal hernia    repaired   History of kidney stones    questionable   Hyperlipidemia    Hypertension    pt denies   Hypothyroidism    Irritable bowel syndrome    Joint pain    Osteoarthritis    Ovarian cyst    Palpitations    Pancreatitis    PONV (postoperative nausea and vomiting)    NO TROUBLE WITH CATARACT SURGERY   Psoriasis    Psoriatic arthritis ([place]) 05/12/2016   Poor response to Humira Inadequate response to Enbrel Inadequate response to Simponi   Shortness of breath    Tachycardia    Ulcerative colitis     Family History  Problem Relation Age  of Onset   Breast cancer Mother 40   Heart disease Mother    Colon polyps Mother    Other Mother        pre-cancerous tumor removed   Colon cancer Mother    Hypertension Mother    Hyperlipidemia Mother    Cancer Mother    Depression Mother    Heart disease Father    Hypertension Father    Hyperlipidemia Father    Stroke Father    Alcohol abuse Father    Crohn's disease Sister    Alcoholism Sister    Irritable  bowel syndrome Other    Crohn's disease Daughter    Breast cancer Paternal Aunt    Breast cancer Maternal Grandmother    Colon polyps Paternal Grandmother    Esophageal cancer Neg Hx    Rectal cancer Neg Hx    Stomach cancer Neg Hx    Pancreatic cancer Neg Hx    Past Surgical History:  Procedure Laterality Date   ABDOMINAL HYSTERECTOMY     ANKLE RECONSTRUCTION     left   bladder tuck     CATARACT EXTRACTION W/PHACO Right 09/11/2017   Procedure: CATARACT EXTRACTION PHACO AND INTRAOCULAR LENS PLACEMENT ([place]);  Surgeon: Birder Robson, MD;  Location: [facility];  Service: Ophthalmology;  Laterality: Right;  [place] 00:18.5 AP% 6.9 CDE 1.29 Fluid Pack Lot # [id number] H   CATARACT EXTRACTION W/PHACO Left 10/02/2017   Procedure: CATARACT EXTRACTION PHACO AND INTRAOCULAR LENS PLACEMENT ([facility]);  Surgeon: Birder Robson, MD;  Location: [facility];  Service: Ophthalmology;  Laterality: Left;  [place] 00:25.9 AP% 6.9 CDE 1.80 Fluid Pack Lot # [id number] H   CLOSED REDUCTION NASAL FRACTURE N/A 05/21/2018   Procedure: CLOSED REDUCTION NASAL FRACTURE;  Surgeon: Clyde Canterbury, MD;  Location: [facility];  Service: ENT;  Laterality: N/A;  Latex sensitivity   COLONOSCOPY     EYE SURGERY     Lasik   FRACTURE SURGERY     KNEE SURGERY     left   LAPAROSCOPIC NISSEN FUNDOPLICATION N/A 1/91/4782   Procedure: LAPAROSCOPIC NISSEN TAKEDOWN AND UPPER ENDOSCOPY;  Surgeon: Johnathan Hausen, MD;  [facility];  Service: General;  Laterality: N/A;   LEFT HEART CATH AND CORONARY ANGIOGRAPHY N/A 05/10/2020   Procedure: LEFT HEART CATH AND CORONARY ANGIOGRAPHY;  Surgeon: Belva Crome, MD;  Location: [facility];  Service: Cardiovascular;  Laterality: N/A;   NASAL SEPTUM SURGERY     NISSEN FUNDOPLICATION     PARTIAL HYSTERECTOMY     POLYPECTOMY     repair cystocele and rectocele     thyroid ablation     TUBAL LIGATION     UPPER GASTROINTESTINAL ENDOSCOPY     UPPER GI ENDOSCOPY  01/12/2017   Procedure:  UPPER GI ENDOSCOPY;  Surgeon: Johnathan Hausen, MD;  [facility];  Service: General;;   Social History   Social History Narrative   Not on file   Immunization History  Administered Date(s) Administered   Influenza Inj Mdck Quad Pf 05/08/2017   Influenza,inj,Quad PF,6+ Mos 03/08/2018, 03/29/2019, 06/25/2019, 03/31/2020, 04/14/2021   Influenza-Unspecified 03/31/2020   PFIZER Comirnaty(Gray Top)Covid-19 Tri-Sucrose Vaccine 09/20/2020   PFIZER(Purple Top)SARS-COV-2 Vaccination 09/28/2019, 10/20/2019, 03/02/2020   Zoster Recombinat (Shingrix) 09/06/2020     Objective: Vital Signs: There were no vitals taken for this visit.   Physical Exam   Musculoskeletal Exam: ***  CDAI Exam: CDAI Score: -- Patient Global: --; Provider Global: -- Swollen: --; Tender: -- Joint Exam 07/05/2021  No joint exam has been documented for this visit   There is currently no information documented on the homunculus. Go to the Rheumatology activity and complete the homunculus joint exam.  Investigation: No additional findings.  Imaging: No results found.  Recent Labs: Lab Results  Component Value Date   WBC 5.6 05/18/2021   HGB 13.2 05/18/2021   PLT 263 05/18/2021   NA 142 05/18/2021   K 4.6 05/18/2021   CL 102 05/18/2021   CO2 26 05/18/2021   GLUCOSE 101 (H) 05/18/2021   BUN 16 05/18/2021   CREATININE 0.96 05/18/2021   BILITOT 0.5 05/18/2021   ALKPHOS 59 05/18/2021   AST 34 05/18/2021   ALT 29 05/18/2021   PROT 7.3 05/18/2021   ALBUMIN 4.2 05/18/2021   CALCIUM 9.6 05/18/2021   GFRAA 86 08/10/2020   QFTBGOLDPLUS Negative 11/12/2020    Speciality Comments: Inadequate response to Enbrel, Humira, Simponi,  Remicade-allergic response  Procedures:  No procedures performed Allergies: Demerol [meperidine], Imuran [azathioprine], Meperidine hcl, Remicade [infliximab], Sulfasalazine, Avelox [moxifloxacin], Doxycycline, Erythromycin, and Latex   Assessment / Plan:     Visit  Diagnoses: No diagnosis found.  Orders: No orders of the defined types were placed in this encounter.  No orders of the defined types were placed in this encounter.   Face-to-face time spent with patient was *** minutes. Greater than 50% of time was spent in counseling and coordination of care.  Follow-Up Instructions: No follow-ups on file.   Earnestine Mealing, CMA  Note - This record has been created using Editor, commissioning.  Chart creation errors have been sought, but may not always  have been located. Such creation errors do not reflect on  the standard of medical care.

## 2021-06-27 ENCOUNTER — Other Ambulatory Visit: Payer: Self-pay | Admitting: Gastroenterology

## 2021-07-05 ENCOUNTER — Ambulatory Visit: Payer: Medicare HMO | Admitting: Rheumatology

## 2021-07-05 DIAGNOSIS — Z87891 Personal history of nicotine dependence: Secondary | ICD-10-CM

## 2021-07-05 DIAGNOSIS — Z79899 Other long term (current) drug therapy: Secondary | ICD-10-CM

## 2021-07-05 DIAGNOSIS — L409 Psoriasis, unspecified: Secondary | ICD-10-CM

## 2021-07-05 DIAGNOSIS — Z8679 Personal history of other diseases of the circulatory system: Secondary | ICD-10-CM

## 2021-07-05 DIAGNOSIS — M170 Bilateral primary osteoarthritis of knee: Secondary | ICD-10-CM

## 2021-07-05 DIAGNOSIS — E559 Vitamin D deficiency, unspecified: Secondary | ICD-10-CM

## 2021-07-05 DIAGNOSIS — M5136 Other intervertebral disc degeneration, lumbar region: Secondary | ICD-10-CM

## 2021-07-05 DIAGNOSIS — L4050 Arthropathic psoriasis, unspecified: Secondary | ICD-10-CM

## 2021-07-05 DIAGNOSIS — Z8719 Personal history of other diseases of the digestive system: Secondary | ICD-10-CM

## 2021-07-05 DIAGNOSIS — M7661 Achilles tendinitis, right leg: Secondary | ICD-10-CM

## 2021-07-06 ENCOUNTER — Telehealth: Payer: Self-pay | Admitting: Pharmacist

## 2021-07-06 DIAGNOSIS — Z961 Presence of intraocular lens: Secondary | ICD-10-CM | POA: Diagnosis not present

## 2021-07-06 NOTE — Telephone Encounter (Signed)
Received call from [employer] stating they have received renewal application for patient's Xeljanz PAP but are missing provider portion. Provider portion signed by Hazel Sams, PA-C. Faxed to [employer] with insurance card copy and med list.  Fax: [phone] Phone: [phone]  Knox Saliva, PharmD, MPH, BCPS Clinical Pharmacist (Rheumatology and Pulmonology)

## 2021-07-08 NOTE — Progress Notes (Signed)
Office Visit Note  Patient: [name]             Date of Birth: 1966/02/27           MRN: [id number]             PCP: Mayra Neer, MD Referring: Mayra Neer, MD Visit Date: 07/22/2021 Occupation: @GUAROCC @  Subjective:  Psoriasis on both feet   History of Present Illness: [name] is a 55 y.o. female with history of psoriatic arthritis, osteoarthritis, and ulcerative colitis.  Patient is on Xeljanz 5 mg 1 tablet twice daily, methotrexate 0.6 mL sq injections once weekly, and folic acid 2 mg daily.  She reports that she developed a viral upper respiratory infection at the end of November at which time she held [place] and methotrexate for 2 weeks.  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  She has been using clobetasol cream topically as needed and the patches have been gradually improving.  She states that while off of therapy she also had increased pain in both hands, both knees, and the left SI joint.  She takes ibuprofen as needed for pain relief.  She denies any joint swelling at this time.  She has been working on weight loss so she has tried changing her diet as well as increasing her walking regimen for exercise.   Activities of Daily Living:  Patient reports morning stiffness for 45 minutes.   Patient Denies nocturnal pain.  Difficulty dressing/grooming: Denies Difficulty climbing stairs: Reports Difficulty getting out of chair: Denies Difficulty using hands for taps, buttons, cutlery, and/or writing: Reports  Review of Systems  Constitutional:  Negative for fatigue.  HENT:  Negative for mouth dryness.   Eyes:  Negative for dryness.  Respiratory:  Negative for shortness of breath.   Cardiovascular:  Negative for swelling in legs/feet.  Gastrointestinal:  Positive for constipation and diarrhea.  Endocrine: Negative for excessive thirst.  Genitourinary:  Negative for difficulty  urinating.  Musculoskeletal:  Positive for joint pain, joint pain, joint swelling and morning stiffness.  Skin:  Positive for rash.  Allergic/Immunologic: Negative for susceptible to infections.  Neurological:  Positive for numbness.  Hematological:  Negative for bruising/bleeding tendency.  Psychiatric/Behavioral:  Positive for sleep disturbance.    PMFS History:  Patient Active Problem List   Diagnosis Date Noted   Coronary artery disease involving native coronary artery of native heart without angina pectoris 06/14/2020   Aortic atherosclerosis ([place]) 05/06/2020   Family history of bicuspid aortic valve 05/06/2020   Dyspnea on exertion 03/31/2020   Prediabetes 01/13/2020   Vitamin D deficiency 12/24/2019   Depression 12/24/2019   Morbid obesity ([place]) 12/24/2019   History of tobacco use 08/06/2019   Allergic rhinitis 04/19/2018   Obstructive sleep apnea syndrome, mild 04/19/2018   COPD (chronic obstructive pulmonary disease) ([place]) 03/26/2018   Rectal bleeding 01/22/2018   LLQ abdominal pain 01/22/2018   Dysphagia 01/12/2017   Spondylosis of lumbar region without myelopathy or radiculopathy 08/11/2016   Primary osteoarthritis of both knees 08/11/2016   History of hypertension 08/11/2016   History of ulcerative colitis 08/11/2016   History of IBS 08/11/2016   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   High risk medications (not anticoagulants) long-term use 05/13/2016   Knee pain, bilateral 05/13/2016   Anserine bursitis 05/13/2016   Chondromalacia patellae, left knee 05/13/2016   Chondromalacia patellae, right knee 05/13/2016   Psoriatic  arthritis ([place]) 05/12/2016   UNSPECIFIED HYPOTHYROIDISM 08/24/2009   Other hyperlipidemia 08/24/2009   Essential hypertension 08/24/2009   GERD 08/24/2009   Ulcerative colitis ([place]) 08/24/2009   IRRITABLE BOWEL SYNDROME 08/24/2009   OVARIAN CYST 08/24/2009   PSORIASIS 08/24/2009   ARTHRITIS 08/24/2009   TACHYCARDIA, HX OF 08/24/2009   PANCREATITIS,  HX OF 08/24/2009   FIBROCYSTIC BREAST DISEASE, HX OF 08/24/2009    Past Medical History:  Diagnosis Date   Allergy    Anxiety    Arthritis    Asthma    Back pain    Cataract    both eyes  surgically repaired   COPD (chronic obstructive pulmonary disease) ([place])    Depression    treated   Dyspnea    Fibrocystic breast disease    GERD (gastroesophageal reflux disease)    History of hiatal hernia    repaired   History of kidney stones    questionable   Hyperlipidemia    Hypertension    pt denies   Hypothyroidism    Irritable bowel syndrome    Joint pain    Osteoarthritis    Ovarian cyst    Palpitations    Pancreatitis    PONV (postoperative nausea and vomiting)    NO TROUBLE WITH CATARACT SURGERY   Psoriasis    Psoriatic arthritis ([place]) 05/12/2016   Poor response to Humira Inadequate response to Enbrel Inadequate response to Simponi   Shortness of breath    Tachycardia    Ulcerative colitis     Family History  Problem Relation Age of Onset   Breast cancer Mother 34   Heart disease Mother    Colon polyps Mother    Other Mother        pre-cancerous tumor removed   Colon cancer Mother    Hypertension Mother    Hyperlipidemia Mother    Cancer Mother    Depression Mother    Heart disease Father    Hypertension Father    Hyperlipidemia Father    Stroke Father    Alcohol abuse Father    Crohn's disease Sister    Alcoholism Sister    Irritable bowel syndrome Other    Crohn's disease Daughter    Breast cancer Paternal Aunt    Breast cancer Maternal Grandmother    Colon polyps Paternal Grandmother    Esophageal cancer Neg Hx    Rectal cancer Neg Hx    Stomach cancer Neg Hx    Pancreatic cancer Neg Hx    Past Surgical History:  Procedure Laterality Date   ABDOMINAL HYSTERECTOMY     ANKLE RECONSTRUCTION     left   bladder tuck     CATARACT EXTRACTION W/PHACO Right 09/11/2017   Procedure: CATARACT EXTRACTION PHACO AND INTRAOCULAR LENS PLACEMENT ([place]);   Surgeon: Birder Robson, MD;  Location: [facility];  Service: Ophthalmology;  Laterality: Right;  [place] 00:18.5 AP% 6.9 CDE 1.29 Fluid Pack Lot # [id number] H   CATARACT EXTRACTION W/PHACO Left 10/02/2017   Procedure: CATARACT EXTRACTION PHACO AND INTRAOCULAR LENS PLACEMENT ([facility]);  Surgeon: Birder Robson, MD;  Location: [facility];  Service: Ophthalmology;  Laterality: Left;  [place] 00:25.9 AP% 6.9 CDE 1.80 Fluid Pack Lot # [id number] H   CLOSED REDUCTION NASAL FRACTURE N/A 05/21/2018   Procedure: CLOSED REDUCTION NASAL FRACTURE;  Surgeon: Clyde Canterbury, MD;  Location: [facility];  Service: ENT;  Laterality: N/A;  Latex sensitivity   COLONOSCOPY     EYE SURGERY     Lasik  FRACTURE SURGERY     KNEE SURGERY     left   LAPAROSCOPIC NISSEN FUNDOPLICATION N/A 6/50/3546   Procedure: LAPAROSCOPIC NISSEN TAKEDOWN AND UPPER ENDOSCOPY;  Surgeon: Johnathan Hausen, MD;  [facility];  Service: General;  Laterality: N/A;   LEFT HEART CATH AND CORONARY ANGIOGRAPHY N/A 05/10/2020   Procedure: LEFT HEART CATH AND CORONARY ANGIOGRAPHY;  Surgeon: Belva Crome, MD;  Location: [facility];  Service: Cardiovascular;  Laterality: N/A;   NASAL SEPTUM SURGERY     NISSEN FUNDOPLICATION     PARTIAL HYSTERECTOMY     POLYPECTOMY     repair cystocele and rectocele     thyroid ablation     TUBAL LIGATION     UPPER GASTROINTESTINAL ENDOSCOPY     UPPER GI ENDOSCOPY  01/12/2017   Procedure: UPPER GI ENDOSCOPY;  Surgeon: Johnathan Hausen, MD;  [facility];  Service: General;;   Social History   Social History Narrative   Not on file   Immunization History  Administered Date(s) Administered   Influenza Inj Mdck Quad Pf 05/08/2017   Influenza,inj,Quad PF,6+ Mos 03/08/2018, 03/29/2019, 06/25/2019, 03/31/2020, 04/14/2021   Influenza-Unspecified 03/31/2020   PFIZER Comirnaty(Gray Top)Covid-19 Tri-Sucrose Vaccine 09/20/2020   PFIZER(Purple Top)SARS-COV-2 Vaccination 09/28/2019, 10/20/2019, 03/02/2020    Zoster Recombinat (Shingrix) 09/06/2020     Objective: Vital Signs: BP 113/78 (BP Location: Left Arm, Patient Position: Sitting, Cuff Size: Large)    Pulse 73    Resp 13    Ht 5' 6"  (1.676 m)    Wt 202 lb 12.8 oz (92 kg)    BMI 32.73 kg/m    Physical Exam Vitals and nursing note reviewed.  Constitutional:      Appearance: She is well-developed.  HENT:     Head: Normocephalic and atraumatic.  Eyes:     Conjunctiva/sclera: Conjunctivae normal.  Pulmonary:     Effort: Pulmonary effort is normal.  Abdominal:     Palpations: Abdomen is soft.  Musculoskeletal:     Cervical back: Normal range of motion.  Skin:    General: Skin is warm and dry.     Capillary Refill: Capillary refill takes less than 2 seconds.     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  Neurological:     Mental Status: She is alert and oriented to person, place, and time.  Psychiatric:        Behavior: Behavior normal.     Musculoskeletal Exam: C-spine, thoracic spine, lumbar spine have good range of motion with no discomfort.  No midline spinal tenderness.  Some tenderness over the left SI joint noted.  Shoulder joints, elbow joints, wrist joints, MCPs, PIPs, DIPs have good range of motion with no synovitis.  Complete fist formation bilaterally.  PIP and DIP thickening consistent with osteoarthritis of both hands.  Tenderness of the left first PIP joint.  Hip joints have good range of motion with no groin pain.  Knee joints have good range of motion with no warmth or effusion.  Ankle joints have good range of motion with no tenderness or joint swelling.    CDAI Exam: CDAI Score: -- Patient Global: --; Provider Global: -- Swollen: --; Tender: -- Joint Exam 07/22/2021   No joint exam has been documented for this visit   There is currently no information documented on the homunculus. Go to the Rheumatology activity and complete the homunculus joint exam.  Investigation: No  additional findings.  Imaging: No results found.  Recent  Labs: Lab Results  Component Value Date   WBC 5.6 05/18/2021   HGB 13.2 05/18/2021   PLT 263 05/18/2021   NA 142 05/18/2021   K 4.6 05/18/2021   CL 102 05/18/2021   CO2 26 05/18/2021   GLUCOSE 101 (H) 05/18/2021   BUN 16 05/18/2021   CREATININE 0.96 05/18/2021   BILITOT 0.5 05/18/2021   ALKPHOS 59 05/18/2021   AST 34 05/18/2021   ALT 29 05/18/2021   PROT 7.3 05/18/2021   ALBUMIN 4.2 05/18/2021   CALCIUM 9.6 05/18/2021   GFRAA 86 08/10/2020   QFTBGOLDPLUS Negative 11/12/2020    Speciality Comments: Inadequate response to Enbrel, Humira, Simponi,  Remicade-allergic response  Procedures:  No procedures performed Allergies: Demerol [meperidine], Imuran [azathioprine], Meperidine hcl, Remicade [infliximab], Sulfasalazine, Avelox [moxifloxacin], Doxycycline, Erythromycin, and Latex   Assessment / Plan:     Visit Diagnoses: Psoriatic arthritis ([place]): She has no synovitis or dactylitis on examination.  She is currently taking Xeljanz 5 mg 1 tablet by mouth twice daily, methotrexate 0.6 mL subcu injections once weekly, folic acid 2 mg daily.  She had a gap in therapy for 2 weeks at the end of November due to developing an upper respiratory tract infection.  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  She was also experiencing increased pain in both hands and both knees but did not have any joint swelling at that time.  On examination today she has some tenderness over the left first PIP joint as well as the left SI joint.  No inflammation was noted. For management of the pustular psoriasis a new prescription for clobetasol ointment was sent to the pharmacy. She has been working on weight loss by changing her diet as well as increasing her walking regimen. Association of heart disease with psoriatic arthritis was discussed. Need to monitor blood pressure, cholesterol, and to  exercise 30-60 minutes on daily basis was discussed.  She will remain on the current treatment regimen.  She was advised to notify us if she develops more frequent flares.  She will follow-up in the office in 5 months.  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  She has been using clobetasol cream topically once daily and has noticed some improvement.  A new prescription for clobetasol ointment was sent to the pharmacy.  She will remain on Xeljanz and methotrexate as prescribed.  She was advised to notify us if she develops any new or worsening of patches.  High risk medication use - Xeljanz 5 mg 1 tablet by mouth twice daily, methotrexate 0.6 ml sq once weekly, folic acid 2 mg po daily. CBC and CMP updated on 05/18/2021.  Her next lab work will be due in February and every 3 months to monitor for drug toxicity.  TB Gold negative on 11/12/2020.  Future order for TB gold will be placed today. - Plan: QuantiFERON-TB Gold Plus Discussed the black box warning associated with Jak inhibitors including Xeljanz.  Discussed that there is an association with taking Morrie Sheldon and an increased risk for major cardiovascular events and malignancy. Discussed the importance of modifying risk factors.  Discussed the importance of close blood pressure monitoring, cholesterol monitoring, and weight management.  She has been working on weight loss and has changed her diet as well as increased her walking regimen for exercise.  Her blood pressure was well controlled today in the office at 113/78. Lipid panel updated on  04/27/21: cholesterol 122, HDL 47, TG 76, LDL 59.    She held [place] and methotrexate for 2 weeks at the end of November due to developing an upper respiratory tract infection.  Discussed the importance of always holding Xeljanz and methotrexate if she develops signs or symptoms of an infection and to resume once the infection has completely cleared.  Screening-pulmonary TB -Future  order for TB gold placed today.  Plan: QuantiFERON-TB Gold Plus  Primary osteoarthritis of both knees: She has good range of motion of both knee joints on examination today.  No warmth or effusion was noted.  She has increased her walking regimen to work on weight loss.  DDD (degenerative disc disease), lumbar: No midline spinal tenderness.  No symptoms of radiculopathy.  Achilles tendonitis, bilateral: She experiences intermittent symptoms of Achilles tendinitis.  She is wearing proper fitting shoes.  History of ulcerative colitis - Followed by Dr. Silverio Decamp.  She has not had any signs or symptoms of a ulcerative colitis flare.  She will remain on Xeljanz as prescribed.  Other medical conditions are listed as follows:  History of IBS  History of hypertension: Blood pressure is well controlled 113/78 today in the office.  Vitamin D deficiency  History of gastroesophageal reflux (GERD)  Former smoker   Orders: Orders Placed This Encounter  Procedures   QuantiFERON-TB Gold Plus   Meds ordered this encounter  Medications   clobetasol ointment (TEMOVATE) 0.05 %    Sig: Apply 1 application topically 2 (two) times daily.    Dispense:  60 g    Refill:  0    Follow-Up Instructions: Return in about 5 months (around 12/20/2021) for Psoriatic arthritis, Osteoarthritis, UC.   Ofilia Neas, PA-C  Note - This record has been created using Dragon software.  Chart creation errors have been sought, but may not always  have been located. Such creation errors do not reflect on  the standard of medical care.

## 2021-07-12 ENCOUNTER — Other Ambulatory Visit: Payer: Self-pay | Admitting: Family Medicine

## 2021-07-12 DIAGNOSIS — Z1231 Encounter for screening mammogram for malignant neoplasm of breast: Secondary | ICD-10-CM

## 2021-07-12 NOTE — Telephone Encounter (Signed)
Received a fax from [facility] regarding an approval for [facility] patient assistance from 07/17/21 to 07/16/22.   Phone number: [phone]  Knox Saliva, PharmD, MPH, BCPS Clinical Pharmacist (Rheumatology and Pulmonology)

## 2021-07-22 ENCOUNTER — Other Ambulatory Visit: Payer: Self-pay

## 2021-07-22 ENCOUNTER — Encounter: Payer: Self-pay | Admitting: Physician Assistant

## 2021-07-22 ENCOUNTER — Ambulatory Visit: Payer: Medicare HMO | Admitting: Physician Assistant

## 2021-07-22 VITALS — BP 113/78 | HR 73 | Resp 13 | Ht 66.0 in | Wt 202.8 lb

## 2021-07-22 DIAGNOSIS — Z111 Encounter for screening for respiratory tuberculosis: Secondary | ICD-10-CM

## 2021-07-22 DIAGNOSIS — L4050 Arthropathic psoriasis, unspecified: Secondary | ICD-10-CM | POA: Diagnosis not present

## 2021-07-22 DIAGNOSIS — M7662 Achilles tendinitis, left leg: Secondary | ICD-10-CM

## 2021-07-22 DIAGNOSIS — Z8679 Personal history of other diseases of the circulatory system: Secondary | ICD-10-CM

## 2021-07-22 DIAGNOSIS — M5136 Other intervertebral disc degeneration, lumbar region: Secondary | ICD-10-CM | POA: Diagnosis not present

## 2021-07-22 DIAGNOSIS — E559 Vitamin D deficiency, unspecified: Secondary | ICD-10-CM | POA: Diagnosis not present

## 2021-07-22 DIAGNOSIS — M170 Bilateral primary osteoarthritis of knee: Secondary | ICD-10-CM

## 2021-07-22 DIAGNOSIS — Z8719 Personal history of other diseases of the digestive system: Secondary | ICD-10-CM

## 2021-07-22 DIAGNOSIS — L409 Psoriasis, unspecified: Secondary | ICD-10-CM

## 2021-07-22 DIAGNOSIS — M7661 Achilles tendinitis, right leg: Secondary | ICD-10-CM | POA: Diagnosis not present

## 2021-07-22 DIAGNOSIS — Z79899 Other long term (current) drug therapy: Secondary | ICD-10-CM

## 2021-07-22 DIAGNOSIS — Z87891 Personal history of nicotine dependence: Secondary | ICD-10-CM

## 2021-07-22 MED ORDER — CLOBETASOL PROPIONATE 0.05 % EX OINT: 1.0000 "application " | TOPICAL_OINTMENT | Freq: Two times a day (BID) | CUTANEOUS | 0 refills | Status: AC

## 2021-07-22 NOTE — Patient Instructions (Signed)
Standing Labs We placed an order today for your standing lab work.   Please have your standing labs drawn in February and every 3 months   If possible, please have your labs drawn 2 weeks prior to your appointment so that the provider can discuss your results at your appointment.  Please note that you may see your imaging and lab results in [place] before we have reviewed them. We may be awaiting multiple results to interpret others before contacting you. Please allow our office up to 72 hours to thoroughly review all of the results before contacting the office for clarification of your results.  We have open lab daily: Monday through Thursday from 1:30-4:30 PM and Friday from 1:30-4:00 PM at the office of Dr. Bo Merino, [facility].   Please be advised, all patients with office appointments requiring lab work will take precedent over walk-in lab work.  If possible, please come for your lab work on Monday and Friday afternoons, as you may experience shorter wait times. The office is located at [address], [place], [place], [place] [address] No appointment is necessary.   Labs are drawn by [facility]. Please bring your co-pay at the time of your lab draw.  You may receive a bill from [facility] for your lab work.  If you wish to have your labs drawn at another location, please call the office 24 hours in advance to send orders.  If you have any questions regarding directions or hours of operation,  please call [phone].   As a reminder, please drink plenty of water prior to coming for your lab work. Thanks!  If you have signs or symptoms of an infection or start antibiotics: First, call your PCP for workup of your infection. Hold your medication through the infection, until you complete your antibiotics, and until symptoms resolve if you take the following: Injectable medication (Actemra, Benlysta, Cimzia, Cosentyx, Enbrel, Humira, Kevzara, Orencia, Remicade, Simponi,  Stelara, Taltz, Tremfya) Methotrexate Leflunomide (Arava) Mycophenolate (Cellcept) [name], Olumiant, or Rinvoq

## 2021-08-18 ENCOUNTER — Encounter: Payer: Self-pay | Admitting: Rheumatology

## 2021-08-18 DIAGNOSIS — R200 Anesthesia of skin: Secondary | ICD-10-CM

## 2021-08-18 NOTE — Telephone Encounter (Signed)
I would recommend a referral to neurology for further evaluation.

## 2021-08-26 ENCOUNTER — Ambulatory Visit
Admission: RE | Admit: 2021-08-26 | Discharge: 2021-08-26 | Disposition: A | Discharge status: Home / Self Care | Payer: Medicare HMO | Source: Ambulatory Visit | Attending: Family Medicine | Admitting: Family Medicine

## 2021-08-26 ENCOUNTER — Other Ambulatory Visit: Payer: Self-pay

## 2021-08-26 DIAGNOSIS — Z1231 Encounter for screening mammogram for malignant neoplasm of breast: Secondary | ICD-10-CM | POA: Diagnosis not present

## 2021-10-06 ENCOUNTER — Encounter: Payer: Self-pay | Admitting: Neurology

## 2021-10-06 ENCOUNTER — Ambulatory Visit: Payer: Medicare HMO | Admitting: Neurology

## 2021-10-06 ENCOUNTER — Encounter: Payer: Self-pay | Admitting: Rheumatology

## 2021-10-06 ENCOUNTER — Other Ambulatory Visit: Payer: Self-pay | Admitting: Physician Assistant

## 2021-10-06 ENCOUNTER — Other Ambulatory Visit: Payer: Self-pay | Admitting: *Deleted

## 2021-10-06 VITALS — BP 122/76 | HR 77 | Ht 67.0 in | Wt 212.4 lb

## 2021-10-06 DIAGNOSIS — G5603 Carpal tunnel syndrome, bilateral upper limbs: Secondary | ICD-10-CM

## 2021-10-06 DIAGNOSIS — Z79899 Other long term (current) drug therapy: Secondary | ICD-10-CM

## 2021-10-06 DIAGNOSIS — E519 Thiamine deficiency, unspecified: Secondary | ICD-10-CM | POA: Diagnosis not present

## 2021-10-06 DIAGNOSIS — G629 Polyneuropathy, unspecified: Secondary | ICD-10-CM | POA: Diagnosis not present

## 2021-10-06 DIAGNOSIS — Z111 Encounter for screening for respiratory tuberculosis: Secondary | ICD-10-CM | POA: Diagnosis not present

## 2021-10-06 DIAGNOSIS — E538 Deficiency of other specified B group vitamins: Secondary | ICD-10-CM | POA: Diagnosis not present

## 2021-10-06 DIAGNOSIS — M79672 Pain in left foot: Secondary | ICD-10-CM

## 2021-10-06 DIAGNOSIS — M79671 Pain in right foot: Secondary | ICD-10-CM | POA: Diagnosis not present

## 2021-10-06 MED ORDER — GABAPENTIN 300 MG PO CAPS: 300.0000 mg | ORAL_CAPSULE | Freq: Three times a day (TID) | ORAL | 11 refills | Status: DC

## 2021-10-06 NOTE — Patient Instructions (Addendum)
Carpal Tunnel Syndrome: EMG/NCS - hand Center ?-May consider daily alpha lipoic acid which is an antioxidant that may reduce free radical oxidative stress associated with diabetic polyneuropathy, existing evidence suggests that alpha lipoic acid significantly reduces stabbing, lancinating and burning pain and diabetic neuropathy with its onset of action as early as 1-2 weeks. 400-627m a day ? ?- Get HgbA1c down to normal ?- blood work to ensure not missing any other cause of nerve damage ?- Treat autoimmune disorders esp plaques on the bottom of the feet ?- Start Gabapentin as needed ?- reflexes intact which is good ? ? ?Peripheral Neuropathy ?Peripheral neuropathy is a type of nerve damage. It affects nerves that carry signals between the spinal cord and the arms, legs, and the rest of the body (peripheral nerves). It does not affect nerves in the spinal cord or brain. In peripheral neuropathy, one nerve or a group of nerves may be damaged. Peripheral neuropathy is a broad category that includes many specific nerve disorders, like diabetic neuropathy, hereditary neuropathy, and carpal tunnel syndrome ?What are the causes? ?This condition may be caused by: ?Diabetes or Pre-diabetes. This is the most common cause of peripheral neuropathy. ?Nerve injury.Psoriatic plaques on the bottom of the feet, ?Pressure or stress on a nerve that lasts a long time. CTS ?Lack (deficiency) of B vitamins. This can result from alcoholism, poor diet, or a restricted diet. ?Infections. ?Autoimmune diseases, such as rheumatoid arthritis and systemic lupus erythematosus. ?Nerve diseases that are passed from parent to child (inherited). ?Some medicines, such as cancer medicines (chemotherapy). ?Poisonous (toxic) substances, such as lead and mercury, alcohol. ?Too little blood flowing to the legs. ?Kidney disease. ?Thyroid disease. ?Central Obesity ?In some cases, the cause of this condition is not known. ?What are the signs or  symptoms? ?Symptoms of this condition depend on which of your nerves is damaged. Common symptoms include: ?Loss of feeling (numbness) in the feet, hands, or both. ?Tingling in the feet, hands, or both. ?Burning pain. ?Very sensitive skin. ?Weakness. ?Not being able to move a part of the body (paralysis). ?Muscle twitching. ?Clumsiness or poor coordination. ?Loss of balance. ?Not being able to control your bladder. ?Feeling dizzy. ?Sexual problems. ?How is this diagnosed? ?Diagnosing and finding the cause of peripheral neuropathy can be difficult. Your health care provider will take your medical history and do a physical exam. A neurological exam will also be done. This involves checking things that are affected by your brain, spinal cord, and nerves (nervous system). For example, your health care provider will check your reflexes, how you move, and what you can feel. ?You may have other tests, such as: ?Blood tests. ?Electromyogram (EMG) and nerve conduction tests. These tests check nerve function and how well the nerves are controlling the muscles. ?Imaging tests, such as CT scans or MRI to rule out other causes of your symptoms. ?Removing a small piece of nerve to be examined in a lab (nerve biopsy). ?Removing and examining a small amount of the fluid that surrounds the brain and spinal cord (lumbar puncture). ?How is this treated? ?Treatment for this condition may involve: ?Treating the underlying cause of the neuropathy, such as diabetes, kidney disease, or vitamin deficiencies. ?Stopping medicines that can cause neuropathy, such as chemotherapy. ?Medicine to help relieve pain. Medicines may include: ?Prescription or over-the-counter pain medicine. ?Antiseizure medicine. ?Antidepressants. ?Pain-relieving patches that are applied to painful areas of skin. ?Surgery to relieve pressure on a nerve or to destroy a nerve that is causing pain. ?Physical  therapy to help improve movement and balance. ?Devices to help you  move around (assistive devices). ?Follow these instructions at home: ?Medicines ?Take over-the-counter and prescription medicines only as told by your health care provider. Do not take any other medicines without first asking your health care provider. ?Do not drive or use heavy machinery while taking prescription pain medicine. ?Lifestyle ? ?Do not use any products that contain nicotine or tobacco, such as cigarettes and e-cigarettes. Smoking keeps blood from reaching damaged nerves. If you need help quitting, ask your health care provider. ?Avoid or limit alcohol. Too much alcohol can cause a vitamin B deficiency, and vitamin B is needed for healthy nerves. ?Eat a healthy diet. This includes: ?Eating foods that are high in fiber, such as fresh fruits and vegetables, whole grains, and beans. ?Limiting foods that are high in fat and processed sugars, such as fried or sweet foods. ?General instructions ? ?If you have diabetes, work closely with your health care provider to keep your blood sugar under control. ?If you have numbness in your feet: ?Check every day for signs of injury or infection. Watch for redness, warmth, and swelling. ?Wear padded socks and comfortable shoes. These help protect your feet. ?Develop a good support system. Living with peripheral neuropathy can be stressful. Consider talking with a mental health specialist or joining a support group. ?Use assistive devices and attend physical therapy as told by your health care provider. This may include using a walker or a cane. ?Keep all follow-up visits as told by your health care provider. This is important. ?Contact a health care provider if: ?You have new signs or symptoms of peripheral neuropathy. ?You are struggling emotionally from dealing with peripheral neuropathy. ?Your pain is not well-controlled. ?Get help right away if: ?You have an injury or infection that is not healing normally. ?You develop new weakness in an arm or leg. ?You have  fallen or do so frequently. ?Summary ?Peripheral neuropathy is when the nerves in the arms, or legs are damaged, resulting in numbness, weakness, or pain. ?There are many causes of peripheral neuropathy, including diabetes, pinched nerves, vitamin deficiencies, autoimmune disease, and hereditary conditions. ?Diagnosing and finding the cause of peripheral neuropathy can be difficult. Your health care provider will take your medical history, do a physical exam, and do tests, including blood tests and nerve function tests. ?Treatment involves treating the underlying cause of the neuropathy and taking medicines to help control pain. Physical therapy and assistive devices may also help. ?This information is not intended to replace advice given to you by your health care provider. Make sure you discuss any questions you have with your health care provider. ?Document Revised: 04/13/2020 Document Reviewed: 04/13/2020 ?Elsevier Patient Education ? [place] ?Raynaud's phenomenon is a condition that affects the blood vessels (arteries) that carry blood to the fingers and toes. The arteries that supply blood to the ears, lips, nipples, or the tip of the nose might also be affected. Raynaud's phenomenon causes the arteries to become narrow temporarily (spasm). As a result, the flow of blood to the affected areas is temporarily decreased. This usually occurs in response to cold temperatures or stress. During an attack, the skin in the affected areas turns white, then blue, and finally red. A person may also feel tingling or numbness in those areas. ?Attacks usually last for only a brief period, and then the blood flow to the area returns to normal. In most cases, Raynaud's phenomenon does not cause serious  health problems. ?What are the causes? ?In many cases, the cause of this condition is not known. The condition may occur on its own (primary Raynaud's phenomenon) or may be associated with  other diseases or factors (secondary Raynaud's phenomenon). ?Possible causes may include: ?Diseases or medical conditions that damage the arteries. ?Injuries and repetitive actions that hurt the hands or feet. ?Being ex

## 2021-10-06 NOTE — Progress Notes (Signed)
?[facility] ? ? ? ?Provider:  Dr Jaynee Eagles ?Requesting Provider: Ofilia Neas, PA-C ?Primary Care Provider:  Mayra Neer, MD ? ?CC:  psoriasis on the bottom of feet ? ?HPI:  [name] is a 56 y.o. female here as requested by Ofilia Neas, PA-C for numbness in feet.   She has psoriasis on the bottom of her feet and scarring. Burning on the bottom of the feet and tingling. Started 4 months ago, she had severe pain and swelling and blistering on the bottom of the feet and since then the symptoms are improved since . Bottom of the feet with scaling and peeling and pustules. Getting better. Appears to have scarring on the bottom of her feet. The psoriasis got so bad she has scarring on the bottom of her feet. And she has numbness in the hands in the mornings. Mainly digits 1-3. Red thickened skin and psoriasis patches. The whole foot will get red. And burns, tingles. She ahs a hx of CTS, sleeps in vraces, getting worse. No other focal neurologic deficits, associated symptoms, inciting events or modifiable factors. ? ?Reviewed notes, labs and imaging from outside physicians, which showed: ? ?XR foot left 03/2020: First MTP, PIP and DIP narrowing was noted.  No erosive changes were  ?noted.  No intertarsal joint space narrowing was noted.  Inferior and  ?posterior calcaneal spurs were noted.  No erosive changes were noted.  No  ?radiographic progression was noted when compared to the x-rays of 2017.  ? ?Impression: These findings are consistent with osteoarthritis of the foot. ? ?XR foot right: First MTP, PIP and DIP narrowing was noted.  No intertarsal joint space  ?narrowing was noted.  A small calcaneal spur was noted.  No erosive  ?changes were noted.  No radiographic progression was noted when compared  ?to the x-rays of 2017.  ? ?Impression: These findings are consistent with osteoarthritis of the foot. ? ?09/2020: hgba1c 5.8 ? ?Review of Systems: ?Patient complains of symptoms per HPI as  well as the following symptoms psoriasis. Pertinent negatives and positives per HPI. All others negative. ? ? ?Social History  ? ?Socioeconomic History  ? Marital status: Married  ?  Spouse name: Not on file  ? Number of children: Not on file  ? Years of education: Not on file  ? Highest education level: Not on file  ?Occupational History  ? 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  Employer: [employer]  ? Occupation: disabled  ?Tobacco Use  ? Smoking status: Former  ?  Packs/day: 1.00  ?  Years: 37.50  ?  Pack years: 37.50  ?  Types: Cigarettes  ?  Quit date: 07/27/2009  ?  Years since quitting: 12.2  ? Smokeless tobacco: Never  ?Vaping Use  ? Vaping Use: Never used  ?Substance and Sexual Activity  ? Alcohol use: Yes  ?  Alcohol/week: 0.0 standard drinks  ?  Comment:  occasionally (1x/mo)  ? Drug use: No  ? Sexual activity: Yes  ?Other Topics Concern  ? Not on file  ?Social History Narrative  ? Not on file  ? ?Social Determinants of Health  ? ?Financial Resource Strain: Not on file  ?Food Insecurity: Not on file  ?Transportation Needs: Not on file  ?Physical Activity: Not on file  ?Stress: Not on file  ?Social Connections: Not on file  ?Intimate Partner Violence: Not on file  ? ? ?Family History  ?Problem Relation Age of Onset  ?  Breast cancer Mother 40  ? Heart disease Mother   ? Colon polyps Mother   ? Other Mother   ?     pre-cancerous tumor removed  ? Colon cancer Mother   ? Hypertension Mother   ? Hyperlipidemia Mother   ? Cancer Mother   ? Depression Mother   ? Heart disease Father   ? Hypertension Father   ? Hyperlipidemia Father   ? Stroke Father   ? Alcohol abuse Father   ? Neuropathy Father   ? Crohn's disease Sister   ? Alcoholism Sister   ? Breast cancer Paternal Aunt   ? Breast cancer Maternal Grandmother   ? Colon polyps Paternal Grandmother   ? Crohn's disease Daughter   ? Irritable bowel syndrome Other   ? Esophageal cancer Neg Hx   ? Rectal cancer Neg Hx   ? Stomach cancer Neg Hx   ?  Pancreatic cancer Neg Hx   ? ? ?Past Medical History:  ?Diagnosis Date  ? Allergy   ? Anxiety   ? Arthritis   ? Asthma   ? Back pain   ? Cataract   ? both eyes  surgically repaired  ? COPD (chronic obstructive pulmonary disease) ([place])   ? Depression   ? treated  ? Dyspnea   ? Fibrocystic breast disease   ? GERD (gastroesophageal reflux disease)   ? History of hiatal hernia   ? repaired  ? History of kidney stones   ? questionable  ? Hyperlipidemia   ? Hypertension   ? pt denies  ? Hypothyroidism   ? Irritable bowel syndrome   ? Joint pain   ? Osteoarthritis   ? Ovarian cyst   ? Palpitations   ? Pancreatitis   ? PONV (postoperative nausea and vomiting)   ? NO TROUBLE WITH CATARACT SURGERY  ? Psoriasis   ? Psoriatic arthritis ([place]) 05/12/2016  ? Poor response to Humira Inadequate response to Enbrel Inadequate response to Simponi  ? Shortness of breath   ? Tachycardia   ? Ulcerative colitis   ? ? ?Patient Active Problem List  ? Diagnosis Date Noted  ? Coronary artery disease involving native coronary artery of native heart without angina pectoris 06/14/2020  ? Aortic atherosclerosis ([place]) 05/06/2020  ? Family history of bicuspid aortic valve 05/06/2020  ? Dyspnea on exertion 03/31/2020  ? Prediabetes 01/13/2020  ? Vitamin D deficiency 12/24/2019  ? Depression 12/24/2019  ? Morbid obesity ([place]) 12/24/2019  ? History of tobacco use 08/06/2019  ? Allergic rhinitis 04/19/2018  ? Obstructive sleep apnea syndrome, mild 04/19/2018  ? COPD (chronic obstructive pulmonary disease) ([place]) 03/26/2018  ? Rectal bleeding 01/22/2018  ? LLQ abdominal pain 01/22/2018  ? Dysphagia 01/12/2017  ? 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 Primary osteoarthritis of both knees 08/11/2016  ? History of hypertension 08/11/2016  ? History of ulcerative colitis 08/11/2016  ? History of IBS 08/11/2016  ? 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 High risk medications (not anticoagulants) long-term use 05/13/2016  ? Knee  pain, bilateral 05/13/2016  ? Anserine bursitis 05/13/2016  ? 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 Chondromalacia patellae, right knee 05/13/2016  ? Psoriatic arthritis ([place]) 05/12/2016  ? UNSPECIFIED HYPOTHYROIDISM 08/24/2009  ? Other hyperlipidemia 08/24/2009  ? Essential hypertension 08/24/2009  ? GERD 08/24/2009  ? Ulcerative colitis ([place]) 08/24/2009  ? IRRITABLE BOWEL SYNDROME 08/24/2009  ? OVARIAN CYST 08/24/2009  ? PSORIASIS 08/24/2009  ? ARTHRITIS 08/24/2009  ? TACHYCARDIA,  HX OF 08/24/2009  ? PANCREATITIS, HX OF 08/24/2009  ? FIBROCYSTIC BREAST DISEASE, HX OF 08/24/2009  ? ? ?Past Surgical History:  ?Procedure Laterality Date  ? ABDOMINAL HYSTERECTOMY    ? ANKLE RECONSTRUCTION    ? left  ? bladder tuck    ? CATARACT EXTRACTION W/PHACO Right 09/11/2017  ? Procedure: CATARACT EXTRACTION PHACO AND INTRAOCULAR LENS PLACEMENT (IOC);  Surgeon: Birder Robson, MD;  Location: [facility];  Service: Ophthalmology;  Laterality: Right;  [place] 00:18.5 ?AP% 6.9 ?CDE 1.29 ?Fluid Pack Lot # [id number]  ? CATARACT EXTRACTION W/PHACO Left 10/02/2017  ? Procedure: CATARACT EXTRACTION PHACO AND INTRAOCULAR LENS PLACEMENT (IOC);  Surgeon: Birder Robson, MD;  Location: [facility];  Service: Ophthalmology;  Laterality: Left;  [place] 00:25.9 ?AP% 6.9 ?CDE 1.80 ?Fluid Pack Lot # [id number]  ? CLOSED REDUCTION NASAL FRACTURE N/A 05/21/2018  ? Procedure: CLOSED REDUCTION NASAL FRACTURE;  Surgeon: Clyde Canterbury, MD;  Location: [facility];  Service: ENT;  Laterality: N/A;  Latex sensitivity  ? COLONOSCOPY    ? EYE SURGERY    ? Lasik  ? FRACTURE SURGERY    ? KNEE SURGERY    ? left  ? LAPAROSCOPIC NISSEN FUNDOPLICATION N/A 03/19/8332  ? Procedure: LAPAROSCOPIC NISSEN TAKEDOWN AND UPPER ENDOSCOPY;  Surgeon: Johnathan Hausen, MD;  Location: [facility];  Service: General;  Laterality: N/A;  ? LEFT HEART CATH AND CORONARY ANGIOGRAPHY N/A 05/10/2020  ? Procedure: LEFT HEART CATH AND CORONARY ANGIOGRAPHY;  Surgeon: Belva Crome, MD;   Location: [facility];  Service: Cardiovascular;  Laterality: N/A;  ? NASAL SEPTUM SURGERY    ? NISSEN FUNDOPLICATION    ? PARTIAL HYSTERECTOMY    ? POLYPECTOMY    ? repair cystocele and rectocele    ? t

## 2021-10-06 NOTE — Telephone Encounter (Signed)
Next Visit: 12/20/2021 ? ?Last Visit: 07/22/2021 ? ?Last Fill: 10/04/2020 ? ?DX: Psoriatic arthritis  ? ?Current Dose per office note 07/22/2021: methotrexate 0.6 ml sq once weekly ? ?Labs: 05/18/2021 CBC and CMP normal. ? ?Patient aware she is due for labs and will update them today.  ? ?Okay to refill MTX?  ?

## 2021-10-07 NOTE — Progress Notes (Signed)
CBC is normal.  Glucose is mildly elevated.

## 2021-10-10 ENCOUNTER — Encounter: Payer: Self-pay | Admitting: Neurology

## 2021-10-10 LAB — CMP14+EGFR
ALT: 17 IU/L (ref 0–32)
AST: 24 IU/L (ref 0–40)
Albumin/Globulin Ratio: 1.4 (ref 1.2–2.2)
Albumin: 4.1 g/dL (ref 3.8–4.9)
Alkaline Phosphatase: 56 IU/L (ref 44–121)
BUN/Creatinine Ratio: 11 (ref 9–23)
BUN: 9 mg/dL (ref 6–24)
Bilirubin Total: 0.2 mg/dL (ref 0.0–1.2)
CO2: 25 mmol/L (ref 20–29)
Calcium: 8.7 mg/dL (ref 8.7–10.2)
Chloride: 102 mmol/L (ref 96–106)
Creatinine, Ser: 0.8 mg/dL (ref 0.57–1.00)
Globulin, Total: 2.9 g/dL (ref 1.5–4.5)
Glucose: 134 mg/dL — ABNORMAL HIGH (ref 70–99)
Potassium: 4.4 mmol/L (ref 3.5–5.2)
Sodium: 139 mmol/L (ref 134–144)
Total Protein: 7 g/dL (ref 6.0–8.5)
eGFR: 86 mL/min/{1.73_m2} (ref >59)

## 2021-10-10 LAB — CBC WITH DIFFERENTIAL/PLATELET
Basophils Absolute: 0 10*3/uL (ref 0.0–0.2)
Basos: 0 %
EOS (ABSOLUTE): 0.1 10*3/uL (ref 0.0–0.4)
Eos: 1 %
Hematocrit: 36.4 % (ref 34.0–46.6)
Hemoglobin: 12.1 g/dL (ref 11.1–15.9)
Immature Grans (Abs): 0 10*3/uL (ref 0.0–0.1)
Immature Granulocytes: 0 %
Lymphocytes Absolute: 2.2 10*3/uL (ref 0.7–3.1)
Lymphs: 30 %
MCH: 32.1 pg (ref 26.6–33.0)
MCHC: 33.2 g/dL (ref 31.5–35.7)
MCV: 97 fL (ref 79–97)
Monocytes Absolute: 0.6 10*3/uL (ref 0.1–0.9)
Monocytes: 8 %
Neutrophils Absolute: 4.4 10*3/uL (ref 1.4–7.0)
Neutrophils: 61 %
Platelets: 249 10*3/uL (ref 150–450)
RBC: 3.77 x10E6/uL (ref 3.77–5.28)
RDW: 11.9 % (ref 11.7–15.4)
WBC: 7.4 10*3/uL (ref 3.4–10.8)

## 2021-10-10 LAB — QUANTIFERON-TB GOLD PLUS
QuantiFERON Mitogen Value: >10 IU/mL
QuantiFERON Nil Value: 0.08 IU/mL
QuantiFERON TB1 Ag Value: 0.06 IU/mL
QuantiFERON TB2 Ag Value: 0.06 IU/mL
QuantiFERON-TB Gold Plus: NEGATIVE

## 2021-10-10 NOTE — Progress Notes (Signed)
TB gold negative

## 2021-10-11 LAB — MULTIPLE MYELOMA PANEL, SERUM
Albumin SerPl Elph-Mcnc: 3.8 g/dL (ref 2.9–4.4)
Albumin/Glob SerPl: 1.3 (ref 0.7–1.7)
Alpha 1: 0.2 g/dL (ref 0.0–0.4)
Alpha2 Glob SerPl Elph-Mcnc: 0.6 g/dL (ref 0.4–1.0)
B-Globulin SerPl Elph-Mcnc: 1.1 g/dL (ref 0.7–1.3)
Gamma Glob SerPl Elph-Mcnc: 1.2 g/dL (ref 0.4–1.8)
Globulin, Total: 3.1 g/dL (ref 2.2–3.9)
IgA/Immunoglobulin A, Serum: 425 mg/dL — ABNORMAL HIGH (ref 87–352)
IgG (Immunoglobin G), Serum: 1270 mg/dL (ref 586–1602)
IgM (Immunoglobulin M), Srm: 87 mg/dL (ref 26–217)
Total Protein: 6.9 g/dL (ref 6.0–8.5)

## 2021-10-11 LAB — VITAMIN B6: Vitamin B6: 10.5 ug/L (ref 3.4–65.2)

## 2021-10-11 LAB — VITAMIN B1: Thiamine: 145.6 nmol/L (ref 66.5–200.0)

## 2021-10-11 LAB — B12 AND FOLATE PANEL
Folate: 17.2 ng/mL (ref >3.0)
Vitamin B-12: 378 pg/mL (ref 232–1245)

## 2021-10-11 LAB — METHYLMALONIC ACID, SERUM: Methylmalonic Acid: 136 nmol/L (ref 0–378)

## 2021-10-12 ENCOUNTER — Telehealth: Payer: Self-pay | Admitting: Neurology

## 2021-10-12 NOTE — Telephone Encounter (Signed)
Referrals sent to [facility] [phone]. ?

## 2021-10-18 DIAGNOSIS — M79641 Pain in right hand: Secondary | ICD-10-CM | POA: Diagnosis not present

## 2021-10-18 DIAGNOSIS — M79642 Pain in left hand: Secondary | ICD-10-CM | POA: Diagnosis not present

## 2021-11-04 ENCOUNTER — Other Ambulatory Visit: Payer: Self-pay | Admitting: Physician Assistant

## 2021-11-04 NOTE — Telephone Encounter (Signed)
Next Visit: 12/20/2021 ? ?Last Visit: 07/22/2021 ? ?Last Fill: 02/18/2021 ? ?[id number] arthritis ? ?Current Dose per office note on 07/22/2021: Xeljanz 5 mg 1 tablet by mouth twice daily ? ?Labs: 10/06/2021 CBC is normal.  Glucose is mildly elevated. ? ?TB Gold: 10/06/2021 negative   ? ?Okay to refill Morrie Sheldon?  ?

## 2021-11-08 ENCOUNTER — Encounter: Payer: Self-pay | Admitting: Gastroenterology

## 2021-11-09 ENCOUNTER — Other Ambulatory Visit: Payer: Self-pay

## 2021-11-18 ENCOUNTER — Ambulatory Visit: Payer: Medicare HMO | Admitting: Internal Medicine

## 2021-11-18 ENCOUNTER — Encounter: Payer: Self-pay | Admitting: Internal Medicine

## 2021-11-18 VITALS — BP 109/75 | HR 63 | Ht 66.0 in | Wt 215.0 lb

## 2021-11-18 DIAGNOSIS — J449 Chronic obstructive pulmonary disease, unspecified: Secondary | ICD-10-CM | POA: Diagnosis not present

## 2021-11-18 DIAGNOSIS — I10 Essential (primary) hypertension: Secondary | ICD-10-CM | POA: Diagnosis not present

## 2021-11-18 DIAGNOSIS — Z8279 Family history of other congenital malformations, deformations and chromosomal abnormalities: Secondary | ICD-10-CM

## 2021-11-18 DIAGNOSIS — I700 Atherosclerosis of aorta: Secondary | ICD-10-CM | POA: Diagnosis not present

## 2021-11-18 DIAGNOSIS — Z87891 Personal history of nicotine dependence: Secondary | ICD-10-CM

## 2021-11-18 DIAGNOSIS — I2510 Atherosclerotic heart disease of native coronary artery without angina pectoris: Secondary | ICD-10-CM

## 2021-11-18 DIAGNOSIS — E782 Mixed hyperlipidemia: Secondary | ICD-10-CM

## 2021-11-18 MED ORDER — NITROGLYCERIN 0.4 MG SL SUBL: 0.4000 mg | SUBLINGUAL_TABLET | SUBLINGUAL | 3 refills | Status: DC | PRN

## 2021-11-18 NOTE — Progress Notes (Signed)
?Cardiology Office Note:   ? ?Date:  11/18/2021  ? ?ID:  [name], DOB Nov 29, 1965, MRN [id number] ? ?PCP:  Mayra Neer, MD  ? ?CC: CAD f/u ? ?History of Present Illness:   ? ?[name] is a 56 y.o. female with a hx of HTN, HLD,former tobacco use- quit in 2011, morbid obesity, non-obstructive coronary artery disease, aortic atherosclerosis, bicuspid aortic valve in the family who presented 05/06/20 with angina pectoris.  ?2021:  Only non-obstructive CAD found on 05/10/20 LHC.  Echo performed 05/27/20 without evidence of bicuspid valve. ?2022: No changes ? ?Patient notes that she is doing great.   ?Still plays with the grand kids.  No symptoms.  ?There are no interval hospital/ED visit.   ? ?Rare chest tightness, with exercise, but resolves with inhaler.   No SOB, but DOE and no PND/Orthopnea.  No weight gain or leg swelling.  No palpitations or syncope.   ? ?She is about to go to [place], then a large trip that culminates in [place]. ? ? ?Past Medical History:  ?Diagnosis Date  ? Allergy   ? Anxiety   ? Arthritis   ? Asthma   ? Back pain   ? Cataract   ? both eyes  surgically repaired  ? COPD (chronic obstructive pulmonary disease) ([place])   ? Depression   ? treated  ? Dyspnea   ? Fibrocystic breast disease   ? GERD (gastroesophageal reflux disease)   ? History of hiatal hernia   ? repaired  ? History of kidney stones   ? questionable  ? Hyperlipidemia   ? Hypertension   ? pt denies  ? Hypothyroidism   ? Irritable bowel syndrome   ? Joint pain   ? Osteoarthritis   ? Ovarian cyst   ? Palpitations   ? Pancreatitis   ? PONV (postoperative nausea and vomiting)   ? NO TROUBLE WITH CATARACT SURGERY  ? Psoriasis   ? Psoriatic arthritis ([place]) 05/12/2016  ? Poor response to Humira Inadequate response to Enbrel Inadequate response to Simponi  ? Shortness of breath   ? Tachycardia   ? Ulcerative colitis   ? ? ?Past Surgical History:  ?Procedure Laterality Date  ? ABDOMINAL HYSTERECTOMY    ? ANKLE  RECONSTRUCTION    ? left  ? bladder tuck    ? CATARACT EXTRACTION W/PHACO Right 09/11/2017  ? Procedure: CATARACT EXTRACTION PHACO AND INTRAOCULAR LENS PLACEMENT (IOC);  Surgeon: Birder Robson, MD;  Location: [facility];  Service: Ophthalmology;  Laterality: Right;  [place] 00:18.5 ?AP% 6.9 ?CDE 1.29 ?Fluid Pack Lot # [id number]  ? CATARACT EXTRACTION W/PHACO Left 10/02/2017  ? Procedure: CATARACT EXTRACTION PHACO AND INTRAOCULAR LENS PLACEMENT (IOC);  Surgeon: Birder Robson, MD;  Location: [facility];  Service: Ophthalmology;  Laterality: Left;  [place] 00:25.9 ?AP% 6.9 ?CDE 1.80 ?Fluid Pack Lot # [id number]  ? CLOSED REDUCTION NASAL FRACTURE N/A 05/21/2018  ? Procedure: CLOSED REDUCTION NASAL FRACTURE;  Surgeon: Clyde Canterbury, MD;  Location: [facility];  Service: ENT;  Laterality: N/A;  Latex sensitivity  ? COLONOSCOPY    ? EYE SURGERY    ? Lasik  ? FRACTURE SURGERY    ? KNEE SURGERY    ? left  ? LAPAROSCOPIC NISSEN FUNDOPLICATION N/A 1/88/4166  ? Procedure: LAPAROSCOPIC NISSEN TAKEDOWN AND UPPER ENDOSCOPY;  Surgeon: Johnathan Hausen, MD;  Location: [facility];  Service: General;  Laterality: N/A;  ? LEFT HEART CATH AND CORONARY ANGIOGRAPHY N/A 05/10/2020  ? Procedure: LEFT HEART  CATH AND CORONARY ANGIOGRAPHY;  Surgeon: Belva Crome, MD;  Location: [facility];  Service: Cardiovascular;  Laterality: N/A;  ? NASAL SEPTUM SURGERY    ? NISSEN FUNDOPLICATION    ? PARTIAL HYSTERECTOMY    ? POLYPECTOMY    ? repair cystocele and rectocele    ? thyroid ablation    ? TUBAL LIGATION    ? UPPER GASTROINTESTINAL ENDOSCOPY    ? UPPER GI ENDOSCOPY  01/12/2017  ? Procedure: UPPER GI ENDOSCOPY;  Surgeon: Johnathan Hausen, MD;  Location: [facility];  Service: General;;  ? ?Current Medications: ?Current Meds  ?Medication Sig  ? albuterol (VENTOLIN HFA) 108 (90 Base) MCG/ACT inhaler Inhale 2 puffs into the lungs every 6 (six) hours as needed for wheezing or shortness of breath.  ? aspirin-acetaminophen-caffeine (EXCEDRIN MIGRAINE)  250-250-65 MG tablet Take 2 tablets by mouth every 8 (eight) hours as needed for headache.   ? Cholecalciferol (VITAMIN D3) 50 MCG (2000 UT) CAPS Take 2,000 Units by mouth.  ? clobetasol cream (TEMOVATE) 4.68 % Apply 1 application topically daily as needed.  ? clobetasol ointment (TEMOVATE) 0.32 % Apply 1 application topically 2 (two) times daily.  ? dicyclomine (BENTYL) 20 MG tablet Take 1 tablet (20 mg total) by mouth as needed.  ? escitalopram (LEXAPRO) 20 MG tablet Take 1 tablet (20 mg total) by mouth daily.  ? estradiol (ESTRACE) 1 MG tablet Take 1 mg by mouth daily.  ? fluticasone (FLONASE) 50 MCG/ACT nasal spray Place 1 spray into both nostrils daily.  ? folic acid (FOLVITE) 1 MG tablet Take 2 mg by mouth daily.  ? gabapentin (NEURONTIN) 300 MG capsule Take 1 capsule (300 mg total) by mouth 3 (three) times daily.  ? ibuprofen (ADVIL,MOTRIN) 200 MG tablet Take 400 mg by mouth every 6 (six) hours as needed for mild pain.   ? levocetirizine (XYZAL) 5 MG tablet Take 5 mg by mouth every evening.  ? levothyroxine (SYNTHROID) 125 MCG tablet Take 125 mcg by mouth every other day. Alternate with 137 mcg  ? levothyroxine (SYNTHROID) 137 MCG tablet Take 137 mcg by mouth every other day. Alternate with 125 mcg  ? methotrexate 50 MG/2ML injection INJECT 0.6ML (15MG) SUBCUTANEOUSLY ONE TIME WEEKLY. DISCARD VIAL 28 DAYS AFTER FIRST USE.  ? metoprolol succinate (TOPROL-XL) 50 MG 24 hr tablet Take 25 mg by mouth daily. Take with or immediately following a meal.  ? montelukast (SINGULAIR) 10 MG tablet Take 10 mg by mouth at bedtime.  ? nitroGLYCERIN (NITROSTAT) 0.4 MG SL tablet Place 1 tablet (0.4 mg total) under the tongue every 5 (five) minutes as needed for chest pain.  ? omeprazole (PRILOSEC) 20 MG capsule TAKE 1 CAPSULE EVERY DAY BEFORE BREAKFAST  ? phentermine (ADIPEX-P) 37.5 MG tablet Take 37.5 mg by mouth every morning.  ? rosuvastatin (CRESTOR) 20 MG tablet Take 20 mg by mouth daily.  ? Tofacitinib Citrate (XELJANZ) 5  MG TABS Take 1 tablet by mouth twice daily as directed by physician  ? Tuberculin-Allergy Syringes 27G X 1/2" 1 ML KIT Inject 1 Syringe into the skin once a week. To be used with weekly methotrexate injections  ?  ? ?Allergies:   Demerol [meperidine], Imuran [azathioprine], Meperidine hcl, Remicade [infliximab], Sulfasalazine, Avelox [moxifloxacin], Doxycycline, Erythromycin, and Latex  ? ?Social History  ? ?Socioeconomic History  ? Marital status: Married  ?  Spouse name: Not on file  ? Number of children: Not on file  ? Years of education: Not on file  ?  Highest education level: Not on file  ?Occupational History  ? 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  Employer: [employer]  ? Occupation: disabled  ?Tobacco Use  ? Smoking status: Former  ?  Packs/day: 1.00  ?  Years: 37.50  ?  Pack years: 37.50  ?  Types: Cigarettes  ?  Quit date: 07/27/2009  ?  Years since quitting: 12.3  ? Smokeless tobacco: Never  ?Vaping Use  ? Vaping Use: Never used  ?Substance and Sexual Activity  ? Alcohol use: Yes  ?  Alcohol/week: 0.0 standard drinks  ?  Comment:  occasionally (1x/mo)  ? Drug use: No  ? Sexual activity: Yes  ?Other Topics Concern  ? Not on file  ?Social History Narrative  ? Not on file  ? ?Social Determinants of Health  ? ?Financial Resource Strain: Not on file  ?Food Insecurity: Not on file  ?Transportation Needs: Not on file  ?Physical Activity: Not on file  ?Stress: Not on file  ?Social Connections: Not on file  ? ? ?Family History: ?The patient's family history includes Alcohol abuse in her father; Alcoholism in her sister; Breast cancer in her maternal grandmother and paternal aunt; Breast cancer (age of onset: 23) in her mother; Cancer in her mother; Colon cancer in her mother; Colon polyps in her mother and paternal grandmother; Crohn's disease in her daughter and sister; Depression in her mother; Heart disease in her father and mother; Hyperlipidemia in her father and mother; Hypertension in her father  and mother; Irritable bowel syndrome in an other family member; Neuropathy in her father; Other in her mother; Stroke in her father. There is no history of Esophageal cancer, Rectal cancer, Stomach cancer, or Pancreat

## 2021-11-18 NOTE — Patient Instructions (Signed)
Medication Instructions:  ?Your physician has recommended you make the following change in your medication:  ?1- Take 1 Nitroglycerin (NTG), under your tongue, while sitting.  If no relief of pain may repeat NTG, one tab every 5 minutes up to 3 tablets total over 15 minutes.  If no relief CALL 911.  If you have dizziness/lightheadness  while taking NTG, stop taking and call 911.   ? ?*If you need a refill on your cardiac medications before your next appointment, please call your pharmacy* ? ?Lab Work: ?If you have labs (blood work) drawn today and your tests are completely normal, you will receive your results only by: ?MyChart Message (if you have MyChart) OR ?A paper copy in the mail ?If you have any lab test that is abnormal or we need to change your treatment, we will call you to review the results. ? ?Testing/Procedures: ?None ordered today. ? ?Follow-Up: ?At [facility], you and your health needs are our priority.  As part of our continuing mission to provide you with exceptional heart care, we have created designated Provider Care Teams.  These Care Teams include your primary Cardiologist (physician) and Advanced Practice Providers (APPs -  Physician Assistants and Nurse Practitioners) who all work together to provide you with the care you need, when you need it. ? ?We recommend signing up for the patient portal called "MyChart".  Sign up information is provided on this After Visit Summary.  MyChart is used to connect with patients for Virtual Visits (Telemedicine).  Patients are able to view lab/test results, encounter notes, upcoming appointments, etc.  Non-urgent messages can be sent to your provider as well.   ?To learn more about what you can do with MyChart, go to [email or web address].   ? ?Your next appointment:   ?1 year(s) ? ?The format for your next appointment:   ?In Person ? ?Provider:   ?Werner Lean, MD { ? ?Important Information About Sugar ? ? ? ? ?  ?

## 2021-11-22 DIAGNOSIS — M79642 Pain in left hand: Secondary | ICD-10-CM | POA: Diagnosis not present

## 2021-11-22 DIAGNOSIS — G5603 Carpal tunnel syndrome, bilateral upper limbs: Secondary | ICD-10-CM | POA: Diagnosis not present

## 2021-11-22 DIAGNOSIS — M79641 Pain in right hand: Secondary | ICD-10-CM | POA: Diagnosis not present

## 2021-11-29 ENCOUNTER — Other Ambulatory Visit: Payer: Self-pay | Admitting: *Deleted

## 2021-11-29 MED ORDER — ALBUTEROL SULFATE HFA 108 (90 BASE) MCG/ACT IN AERS: 2.0000 | INHALATION_SPRAY | Freq: Four times a day (QID) | RESPIRATORY_TRACT | 3 refills | Status: DC | PRN

## 2021-12-02 DIAGNOSIS — G5603 Carpal tunnel syndrome, bilateral upper limbs: Secondary | ICD-10-CM | POA: Diagnosis not present

## 2021-12-07 NOTE — Progress Notes (Signed)
Office Visit Note  Patient: [name]             Date of Birth: [birth year]           MRN: [id number]             PCP: Mayra Neer, MD Referring: Mayra Neer, MD Visit Date: 12/20/2021 Occupation: @GUAROCC @  Subjective:  Medication management  History of Present Illness: [name] is a 56 y.o. female with history of psoriatic arthritis and psoriasis.  She has been on Xeljanz 5 mg 1 tablet p.o. twice daily and methotrexate 0.6 mL SQ weekly along with folic acid.  She has been tolerating both medications well.  She denies any increased joint swelling or joint pain.  She has been tolerating medications well.  Still have some psoriasis on her feet.  She is not seeing a dermatologist.  She uses clobetasol ointment as needed.  She states she developed some tingling in her bilateral feet and was seen by Dr. Jaynee Eagles.  Dr. Jaynee Eagles prescribed gabapentin.  She also had bilateral carpal tunnel release by Dr. Burney Gauze recently for numbness in her bilateral hands.  Since March she has been noticing some discoloration in her hands and her feet.  She has not had an episode of Achilles tendinitis but she gets soreness in the Achilles tendon at times.  There is no history of recent [place] fasciitis or uveitis.  She has off-and-on discomfort in her lower back.  She denies any flare of ulcerative colitis.  Activities of Daily Living:  Patient reports morning stiffness for 45 minutes.   Patient Denies nocturnal pain.  Difficulty dressing/grooming: Denies Difficulty climbing stairs: Reports Difficulty getting out of chair: Denies Difficulty using hands for taps, buttons, cutlery, and/or writing: Reports  Review of Systems  Constitutional:  Positive for fatigue.  HENT:  Negative for mouth dryness.   Eyes:  Negative for dryness.  Respiratory:  Negative for shortness of breath and difficulty breathing.   Cardiovascular:  Positive for swelling in legs/feet. Negative for chest pain and  palpitations.  Gastrointestinal:  Positive for constipation and diarrhea.  Endocrine: Negative for excessive thirst.  Genitourinary:  Negative for difficulty urinating.  Musculoskeletal:  Positive for joint pain, joint pain, joint swelling and morning stiffness. Negative for myalgias and myalgias.  Skin:  Positive for color change and rash. Negative for sensitivity to sunlight.  Allergic/Immunologic: Negative for susceptible to infections.  Neurological:  Positive for numbness and weakness.  Hematological:  Negative for bruising/bleeding tendency and swollen glands.  Psychiatric/Behavioral:  Positive for sleep disturbance. Negative for depressed mood. The patient is not nervous/anxious.    PMFS History:  Patient Active Problem List   Diagnosis Date Noted   Coronary artery disease involving native coronary artery of native heart without angina pectoris 06/14/2020   Aortic atherosclerosis ([place]) 05/06/2020   Family history of bicuspid aortic valve 05/06/2020   Dyspnea on exertion 03/31/2020   Vitamin D deficiency 12/24/2019   Depression 12/24/2019   History of tobacco use 08/06/2019   Allergic rhinitis 04/19/2018   Obstructive sleep apnea syndrome, mild 04/19/2018   COPD (chronic obstructive pulmonary disease) ([place]) 03/26/2018   Rectal bleeding 01/22/2018   LLQ abdominal pain 01/22/2018   Dysphagia 01/12/2017   Spondylosis of lumbar region without myelopathy or radiculopathy 08/11/2016   Primary osteoarthritis of both knees 08/11/2016   History of hypertension 08/11/2016   History of ulcerative colitis 08/11/2016   History of IBS 08/11/2016   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  High risk medications (not anticoagulants) long-term use 05/13/2016   Knee pain, bilateral 05/13/2016   Anserine bursitis 05/13/2016   Chondromalacia patellae, left knee 05/13/2016   Chondromalacia patellae, right knee 05/13/2016   Psoriatic arthritis ([place]) 05/12/2016   UNSPECIFIED HYPOTHYROIDISM 08/24/2009    Other hyperlipidemia 08/24/2009   Essential hypertension 08/24/2009   GERD 08/24/2009   Ulcerative colitis ([place]) 08/24/2009   IRRITABLE BOWEL SYNDROME 08/24/2009   OVARIAN CYST 08/24/2009   PSORIASIS 08/24/2009   ARTHRITIS 08/24/2009   PANCREATITIS, HX OF 08/24/2009   FIBROCYSTIC BREAST DISEASE, HX OF 08/24/2009    Past Medical History:  Diagnosis Date   Allergy    Anxiety    Arthritis    Asthma    Back pain    Cataract    both eyes  surgically repaired   COPD (chronic obstructive pulmonary disease) ([place])    Depression    treated   Dyspnea    Fibrocystic breast disease    GERD (gastroesophageal reflux disease)    History of hiatal hernia    repaired   History of kidney stones    questionable   Hyperlipidemia    Hypertension    pt denies   Hypothyroidism    Irritable bowel syndrome    Joint pain    Osteoarthritis    Ovarian cyst    Palpitations    Pancreatitis    PONV (postoperative nausea and vomiting)    NO TROUBLE WITH CATARACT SURGERY   Psoriasis    Psoriatic arthritis ([employer]) 05/12/2016   Poor response to Humira Inadequate response to Enbrel Inadequate response to Simponi   Shortness of breath    Tachycardia    Ulcerative colitis     Family History  Problem Relation Age of Onset   Breast cancer Mother 73   Heart disease Mother    Colon polyps Mother    Other Mother        pre-cancerous tumor removed   Colon cancer Mother    Hypertension Mother    Hyperlipidemia Mother    Cancer Mother    Depression Mother    Heart disease Father    Hypertension Father    Hyperlipidemia Father    Stroke Father    Alcohol abuse Father    Neuropathy Father    Crohn's disease Sister    Alcoholism Sister    Breast cancer Paternal Aunt    Breast cancer Maternal Grandmother    Colon polyps Paternal Grandmother    Crohn's disease Daughter    Irritable bowel syndrome Other    Esophageal cancer Neg Hx    Rectal cancer Neg Hx    Stomach cancer Neg Hx    Pancreatic  cancer Neg Hx    Past Surgical History:  Procedure Laterality Date   ABDOMINAL HYSTERECTOMY     ANKLE RECONSTRUCTION     left   bladder tuck     CARPAL TUNNEL RELEASE     CATARACT EXTRACTION W/PHACO Right 09/11/2017   Procedure: CATARACT EXTRACTION PHACO AND INTRAOCULAR LENS PLACEMENT ([place]);  Surgeon: Birder Robson, MD;  Location: [facility];  Service: Ophthalmology;  Laterality: Right;  [place] 00:18.5 AP% 6.9 CDE 1.29 Fluid Pack Lot # [id number]   CATARACT EXTRACTION W/PHACO Left 10/02/2017   Procedure: CATARACT EXTRACTION PHACO AND INTRAOCULAR LENS PLACEMENT ([facility]);  Surgeon: Birder Robson, MD;  Location: [facility];  Service: Ophthalmology;  Laterality: Left;  [place] 00:25.9 AP% 6.9 CDE 1.80 Fluid Pack Lot # [id number]   CLOSED REDUCTION NASAL FRACTURE N/A 05/21/2018  Procedure: CLOSED REDUCTION NASAL FRACTURE;  Surgeon: Clyde Canterbury, MD;  Location: [facility];  Service: ENT;  Laterality: N/A;  Latex sensitivity   COLONOSCOPY     EYE SURGERY     Lasik   FRACTURE SURGERY     KNEE SURGERY     left   LAPAROSCOPIC NISSEN FUNDOPLICATION N/A 74/06/8785   Procedure: LAPAROSCOPIC NISSEN TAKEDOWN AND UPPER ENDOSCOPY;  Surgeon: Johnathan Hausen, MD;  Location: [facility];  Service: General;  Laterality: N/A;   LEFT HEART CATH AND CORONARY ANGIOGRAPHY N/A 05/10/2020   Procedure: LEFT HEART CATH AND CORONARY ANGIOGRAPHY;  Surgeon: Belva Crome, MD;  Location: [facility];  Service: Cardiovascular;  Laterality: N/A;   NASAL SEPTUM SURGERY     NISSEN FUNDOPLICATION     PARTIAL HYSTERECTOMY     POLYPECTOMY     repair cystocele and rectocele     thyroid ablation     TUBAL LIGATION     UPPER GASTROINTESTINAL ENDOSCOPY     UPPER GI ENDOSCOPY  01/12/2017   Procedure: UPPER GI ENDOSCOPY;  Surgeon: Johnathan Hausen, MD;  Location: [facility];  Service: General;;   Social History   Social History Narrative   Not on file   Immunization History  Administered Date(s) Administered    Influenza Inj Mdck Quad Pf 05/08/2017   Influenza,inj,Quad PF,6+ Mos 03/08/2018, 03/29/2019, 06/25/2019, 03/31/2020, 04/14/2021   Influenza-Unspecified 03/31/2020   PFIZER Comirnaty(Gray Top)Covid-19 Tri-Sucrose Vaccine 09/20/2020   PFIZER(Purple Top)SARS-COV-2 Vaccination 09/28/2019, 10/20/2019, 03/02/2020   Zoster Recombinat (Shingrix) 09/06/2020     Objective: Vital Signs: BP 106/71 (BP Location: Left Arm, Patient Position: Sitting, Cuff Size: Normal)   Pulse 80   Resp 15   Ht 5' 6"  (1.676 m)   Wt 207 lb (93.9 kg)   BMI 33.41 kg/m    Physical Exam Vitals and nursing note reviewed.  Constitutional:      Appearance: She is well-developed.  HENT:     Head: Normocephalic and atraumatic.  Eyes:     Conjunctiva/sclera: Conjunctivae normal.  Cardiovascular:     Rate and Rhythm: Normal rate and regular rhythm.     Heart sounds: Normal heart sounds.  Pulmonary:     Effort: Pulmonary effort is normal.     Breath sounds: Normal breath sounds.  Abdominal:     General: Bowel sounds are normal.     Palpations: Abdomen is soft.  Musculoskeletal:     Cervical back: Normal range of motion.  Lymphadenopathy:     Cervical: No cervical adenopathy.  Skin:    General: Skin is warm and dry.     Capillary Refill: Capillary refill takes less than 2 seconds.     Comments: Psoriasis patches were noted over bilateral plantar surface.  Neurological:     Mental Status: She is alert and oriented to person, place, and time.  Psychiatric:        Behavior: Behavior normal.     Musculoskeletal Exam: C-spine thoracic and lumbar spine with good range of motion.  She had no SI joint tenderness.  Shoulder joints, and elbow joints with good range of motion.  She had limited range of motion of bilateral wrist joints due to recent carpal tunnel release.  There are bilateral CMC PIP and DIP thickening with no synovitis.  Hip joints and knee joints in good range of motion without any warmth swelling or  effusion.  There was no tenderness over ankles or MTPs.  No synovitis was noted.  CDAI Exam: CDAI  Score: -- Patient Global: --; Provider Global: -- Swollen: --; Tender: -- Joint Exam 12/20/2021   No joint exam has been documented for this visit   There is currently no information documented on the homunculus. Go to the Rheumatology activity and complete the homunculus joint exam.  Investigation: No additional findings.  Imaging: No results found.  Recent Labs: Lab Results  Component Value Date   WBC 7.4 10/06/2021   HGB 12.1 10/06/2021   PLT 249 10/06/2021   NA 139 10/06/2021   K 4.4 10/06/2021   CL 102 10/06/2021   CO2 25 10/06/2021   GLUCOSE 134 (H) 10/06/2021   BUN 9 10/06/2021   CREATININE 0.80 10/06/2021   BILITOT 0.2 10/06/2021   ALKPHOS 56 10/06/2021   AST 24 10/06/2021   ALT 17 10/06/2021   PROT 6.9 10/06/2021   ALBUMIN 4.1 10/06/2021   CALCIUM 8.7 10/06/2021   GFRAA 86 08/10/2020   QFTBGOLDPLUS Negative 10/06/2021    Speciality Comments: Inadequate response to Enbrel, Humira, Simponi,  Remicade-allergic response  Procedures:  No procedures performed Allergies: Demerol [meperidine], Imuran [azathioprine], Meperidine hcl, Remicade [infliximab], Sulfasalazine, Avelox [moxifloxacin], Doxycycline, Erythromycin, and Latex   Assessment / Plan:     Visit Diagnoses: Psoriatic arthritis (HCC)-her arthritis is well controlled on Xeljanz and methotrexate combination.  No synovitis was noted on the examination.  She has been tolerating Xeljanz and methotrexate without any side effects.  Psoriasis-psoriasis patches been noted over bilateral feet.  High risk medication use - Xeljanz 5 mg 1 tablet by mouth twice daily, methotrexate 0.6 ml sq once weekly, folic acid 2 mg po daily. -Labs obtained on October 06, 2021 were reviewed which were within normal limits.  TB gold was negative on October 06, 2021.  We will obtain labs today and then every 3 months to monitor for drug  toxicity.  Plan: CBC with Differential/Platelet, COMPLETE METABOLIC PANEL WITH GFR.  Information on immunization was placed in the AVS.  She was also advised to hold Xeljanz and methotrexate in case she develops an infection and resume after the infection resolves.  Raynaud's phenomenon without gangrene -patient complains of new onset Raynaud's.  Use of beta-blockers may contribute to Raynaud's phenomenon.  She is also former smoker.  Detailed counseling was provided.  Keeping core temperature warm and wearing warm clothing was discussed.  I will obtain additional labs to look for any autoimmune causes.  Plan: ANA, Anti-DNA antibody, double-stranded, RNP Antibody, Anti-Smith antibody, Sjogrens syndrome-A extractable nuclear antibody, Sjogrens syndrome-B extractable nuclear antibody, Anti-scleroderma antibody, C3 and C4, Beta-2 glycoprotein antibodies, Cardiolipin antibodies, IgG, IgM, IgA, Lupus Anticoagulant Eval w/Reflex, Cryoglobulin  S/p bilateral carpal tunnel release-she had bilateral carpal tunnel release by Dr. Burney Gauze in May 2023.  She is still healing from it.  Primary osteoarthritis of both knees-she has off-and-on discomfort in her knee joints.  No warmth swelling or effusion was noted.  Achilles tendonitis, bilateral-there was no tenderness over Achilles tendon.  She complains of intermittent discomfort.  DDD (degenerative disc disease), lumbar-she has off-and-on pain in the lower back.  She had good mobility without any point tenderness today.  History of ulcerative colitis -she denies any recent flares of ulcerative colitis.  She is followed by Dr. Silverio Decamp.  History of gastroesophageal reflux (GERD)  History of IBS  Elevated hemoglobin A1c -patient states that she has been told she is prediabetic and her hemoglobin A1c is elevated.  She would like to have repeat test done.  Dietary modifications were discussed at length.  Plan: Hemoglobin A1c  History of hypertension-blood  pressure is normal.  Vitamin D deficiency  Former smoker  Orders: Orders Placed This Encounter  Procedures   CBC with Differential/Platelet   COMPLETE METABOLIC PANEL WITH GFR   ANA   Anti-DNA antibody, double-stranded   RNP Antibody   Anti-Smith antibody   Sjogrens syndrome-A extractable nuclear antibody   Sjogrens syndrome-B extractable nuclear antibody   Anti-scleroderma antibody   C3 and C4   Beta-2 glycoprotein antibodies   Cardiolipin antibodies, IgG, IgM, IgA   Lupus Anticoagulant Eval w/Reflex   Cryoglobulin   Hemoglobin A1c   No orders of the defined types were placed in this encounter.    Follow-Up Instructions: Return in about 3 months (around 03/22/2022) for Psoriatic arthritis, Osteoarthritis.   Bo Merino, MD  Note - This record has been created using Editor, commissioning.  Chart creation errors have been sought, but may not always  have been located. Such creation errors do not reflect on  the standard of medical care.

## 2021-12-20 ENCOUNTER — Encounter: Payer: Self-pay | Admitting: Rheumatology

## 2021-12-20 ENCOUNTER — Ambulatory Visit: Payer: Medicare HMO | Admitting: Rheumatology

## 2021-12-20 VITALS — BP 106/71 | HR 80 | Resp 15 | Ht 66.0 in | Wt 207.0 lb

## 2021-12-20 DIAGNOSIS — Z9889 Other specified postprocedural states: Secondary | ICD-10-CM

## 2021-12-20 DIAGNOSIS — M7661 Achilles tendinitis, right leg: Secondary | ICD-10-CM

## 2021-12-20 DIAGNOSIS — L409 Psoriasis, unspecified: Secondary | ICD-10-CM

## 2021-12-20 DIAGNOSIS — Z8719 Personal history of other diseases of the digestive system: Secondary | ICD-10-CM

## 2021-12-20 DIAGNOSIS — E559 Vitamin D deficiency, unspecified: Secondary | ICD-10-CM

## 2021-12-20 DIAGNOSIS — L4050 Arthropathic psoriasis, unspecified: Secondary | ICD-10-CM

## 2021-12-20 DIAGNOSIS — M5136 Other intervertebral disc degeneration, lumbar region: Secondary | ICD-10-CM | POA: Diagnosis not present

## 2021-12-20 DIAGNOSIS — M7662 Achilles tendinitis, left leg: Secondary | ICD-10-CM

## 2021-12-20 DIAGNOSIS — I7300 Raynaud's syndrome without gangrene: Secondary | ICD-10-CM | POA: Diagnosis not present

## 2021-12-20 DIAGNOSIS — Z8679 Personal history of other diseases of the circulatory system: Secondary | ICD-10-CM

## 2021-12-20 DIAGNOSIS — R7309 Other abnormal glucose: Secondary | ICD-10-CM | POA: Diagnosis not present

## 2021-12-20 DIAGNOSIS — Z87891 Personal history of nicotine dependence: Secondary | ICD-10-CM

## 2021-12-20 DIAGNOSIS — M170 Bilateral primary osteoarthritis of knee: Secondary | ICD-10-CM

## 2021-12-20 DIAGNOSIS — Z79899 Other long term (current) drug therapy: Secondary | ICD-10-CM

## 2021-12-20 NOTE — Patient Instructions (Signed)
Standing Labs We placed an order today for your standing lab work.   Please have your standing labs drawn in September and every 3 months  If possible, please have your labs drawn 2 weeks prior to your appointment so that the provider can discuss your results at your appointment.  Please note that you may see your imaging and lab results in [place] before we have reviewed them. We may be awaiting multiple results to interpret others before contacting you. Please allow our office up to 72 hours to thoroughly review all of the results before contacting the office for clarification of your results.  We have open lab daily: Monday through Thursday from 1:30-4:30 PM and Friday from 1:30-4:00 PM at the office of Dr. Bo Merino, [facility].   Please be advised, all patients with office appointments requiring lab work will take precedent over walk-in lab work.  If possible, please come for your lab work on Monday and Friday afternoons, as you may experience shorter wait times. The office is located at [address] [place], [place], [place] [address] No appointment is necessary.   Labs are drawn by [facility]. Please bring your co-pay at the time of your lab draw.  You may receive a bill from [facility] for your lab work.  Please note if you are on Hydroxychloroquine and and an order has been placed for a Hydroxychloroquine level, you will need to have it drawn 4 hours or more after your last dose.  If you wish to have your labs drawn at another location, please call the office 24 hours in advance to send orders.  If you have any questions regarding directions or hours of operation,  please call [phone].   As a reminder, please drink plenty of water prior to coming for your lab work. Thanks!   Vaccines You are taking a medication(s) that can suppress your immune system.  The following immunizations are recommended: Flu annually Covid-19  Td/Tdap (tetanus, diphtheria,  pertussis) every 10 years Pneumonia (Prevnar 15 then Pneumovax 23 at least 1 year apart.  Alternatively, can take Prevnar 20 without needing additional dose) Shingrix: 2 doses from 4 weeks to 6 months apart  Please check with your PCP to make sure you are up to date.   If you have signs or symptoms of an infection or start antibiotics: First, call your PCP for workup of your infection. Hold your medication through the infection, until you complete your antibiotics, and until symptoms resolve if you take the following: Injectable medication (Actemra, Benlysta, Cimzia, Cosentyx, Enbrel, Humira, Kevzara, Orencia, Remicade, Simponi, Stelara, Taltz, Tremfya) Methotrexate Leflunomide (Arava) Mycophenolate (Cellcept) [name], Olumiant, or Rinvoq

## 2021-12-26 LAB — COMPLETE METABOLIC PANEL WITH GFR
AG Ratio: 1.3 (calc) (ref 1.0–2.5)
ALT: 15 U/L (ref 6–29)
AST: 19 U/L (ref 10–35)
Albumin: 4.2 g/dL (ref 3.6–5.1)
Alkaline phosphatase (APISO): 48 U/L (ref 37–153)
BUN: 19 mg/dL (ref 7–25)
CO2: 28 mmol/L (ref 20–32)
Calcium: 9.4 mg/dL (ref 8.6–10.4)
Chloride: 101 mmol/L (ref 98–110)
Creat: 0.73 mg/dL (ref 0.50–1.03)
Globulin: 3.3 g/dL (calc) (ref 1.9–3.7)
Glucose, Bld: 95 mg/dL (ref 65–99)
Potassium: 4.1 mmol/L (ref 3.5–5.3)
Sodium: 138 mmol/L (ref 135–146)
Total Bilirubin: 0.7 mg/dL (ref 0.2–1.2)
Total Protein: 7.5 g/dL (ref 6.1–8.1)
eGFR: 96 mL/min/{1.73_m2} (ref >=60)

## 2021-12-26 LAB — HEMOGLOBIN A1C
Hgb A1c MFr Bld: 5.6 % of total Hgb (ref <5.7)
Mean Plasma Glucose: 114 mg/dL
eAG (mmol/L): 6.3 mmol/L

## 2021-12-26 LAB — CBC WITH DIFFERENTIAL/PLATELET
Absolute Monocytes: 459 cells/uL (ref 200–950)
Basophils Absolute: 28 cells/uL (ref 0–200)
Basophils Relative: 0.5 %
Eosinophils Absolute: 39 cells/uL (ref 15–500)
Eosinophils Relative: 0.7 %
HCT: 40.7 % (ref 35.0–45.0)
Hemoglobin: 13.7 g/dL (ref 11.7–15.5)
Lymphs Abs: 913 cells/uL (ref 850–3900)
MCH: 32.6 pg (ref 27.0–33.0)
MCHC: 33.7 g/dL (ref 32.0–36.0)
MCV: 96.9 fL (ref 80.0–100.0)
MPV: 10.7 fL (ref 7.5–12.5)
Monocytes Relative: 8.2 %
Neutro Abs: 4161 cells/uL (ref 1500–7800)
Neutrophils Relative %: 74.3 %
Platelets: 277 10*3/uL (ref 140–400)
RBC: 4.2 10*6/uL (ref 3.80–5.10)
RDW: 12.3 % (ref 11.0–15.0)
Total Lymphocyte: 16.3 %
WBC: 5.6 10*3/uL (ref 3.8–10.8)

## 2021-12-26 LAB — ANTI-SCLERODERMA ANTIBODY: Scleroderma (Scl-70) (ENA) Antibody, IgG: <1 AI

## 2021-12-26 LAB — BETA-2 GLYCOPROTEIN ANTIBODIES
Beta-2 Glyco 1 IgA: <2 U/mL (ref <20.0)
Beta-2 Glyco 1 IgM: <2 U/mL (ref <20.0)
Beta-2 Glyco I IgG: <2 U/mL (ref <20.0)

## 2021-12-26 LAB — C3 AND C4
C3 Complement: 158 mg/dL (ref 83–193)
C4 Complement: 32 mg/dL (ref 15–57)

## 2021-12-26 LAB — ANTI-NUCLEAR AB-TITER (ANA TITER)
ANA TITER: 1:40 {titer} — ABNORMAL HIGH
ANA Titer 1: 1:320 {titer} — ABNORMAL HIGH

## 2021-12-26 LAB — SJOGRENS SYNDROME-A EXTRACTABLE NUCLEAR ANTIBODY: SSA (Ro) (ENA) Antibody, IgG: <1 AI

## 2021-12-26 LAB — ANTI-DNA ANTIBODY, DOUBLE-STRANDED: ds DNA Ab: 1 IU/mL

## 2021-12-26 LAB — CARDIOLIPIN ANTIBODIES, IGG, IGM, IGA
Anticardiolipin IgA: <2 APL-U/mL (ref <20.0)
Anticardiolipin IgG: <2 GPL-U/mL (ref <20.0)
Anticardiolipin IgM: 3.4 MPL-U/mL (ref <20.0)

## 2021-12-26 LAB — RNP ANTIBODY: Ribonucleic Protein(ENA) Antibody, IgG: <1 AI

## 2021-12-26 LAB — CRYOGLOBULIN: Cryoglobulin, Qualitative Analysis: NOT DETECTED

## 2021-12-26 LAB — SJOGRENS SYNDROME-B EXTRACTABLE NUCLEAR ANTIBODY: SSB (La) (ENA) Antibody, IgG: <1 AI

## 2021-12-26 LAB — ANA: Anti Nuclear Antibody (ANA): POSITIVE — AB

## 2021-12-26 LAB — ANTI-SMITH ANTIBODY: ENA SM Ab Ser-aCnc: <1 AI

## 2022-01-27 ENCOUNTER — Other Ambulatory Visit: Payer: Self-pay | Admitting: Physician Assistant

## 2022-01-27 NOTE — Telephone Encounter (Signed)
Next Visit: 03/27/2022  Last Visit: 12/20/2021  Last Fill: 11/04/2021  OP:[id number] arthritis   Current Dose per office note 12/20/2021: Morrie Sheldon 5 mg 1 tablet by mouth twice daily  Labs: 12/20/2021 CBC/CMP WNL  TB Gold: 10/06/2021 Neg    Okay to refill Morrie Sheldon?

## 2022-02-16 ENCOUNTER — Other Ambulatory Visit: Payer: Self-pay | Admitting: Rheumatology

## 2022-02-16 MED ORDER — METHOTREXATE 2.5 MG PO TABS: 15.0000 mg | ORAL_TABLET | ORAL | 0 refills | Status: DC

## 2022-02-16 NOTE — Telephone Encounter (Signed)
Next Visit: 03/27/2022   Last Visit: 12/20/2021   Last Fill: 10/06/2021  EU:[id number] arthritis    Current Dose per office note 12/20/2021: methotrexate 0.6 ml sq once weekly  Labs: 12/20/2021 CBC/CMP WNL  Okay to refill MTX as tablets due to backorder on vial?

## 2022-02-16 NOTE — Telephone Encounter (Signed)
Patient left a voicemail stating her Methotrexate medication is on backorder and is requesting a prescription for the pill form.

## 2022-02-22 ENCOUNTER — Encounter (INDEPENDENT_AMBULATORY_CARE_PROVIDER_SITE_OTHER): Payer: Self-pay

## 2022-03-13 NOTE — Progress Notes (Unsigned)
Office Visit Note  Patient: [name]             Date of Birth: [birth year]           MRN: [id number]             PCP: Mayra Neer, MD Referring: Mayra Neer, MD Visit Date: 03/27/2022 Occupation: @GUAROCC @  Subjective:  Joint aching and stiffness   History of Present Illness: [name] is a 56 y.o. female with history psoriatic arthritis, osteoarthritis, and DDD. She is taking Xeljanz 5 mg 1 tablet by mouth twice daily, methotrexate 6 tablets by mouth once weekly, folic acid 2 mg po daily.  Patient reports that she was switched from injectable to oral methotrexate 1-1-1/2 months ago due to the pharmacy shortage.  She states that she is noticing increased aching and joint stiffness especially in her hands, knees, and ankles since switching to oral methotrexate.  She denies any joint swelling at this time.  She continues to have some discomfort in the left SI joint and has some Achilles tendinitis in the left foot.  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   She denies any recent or recurrent infections.  She is scheduled for an upcoming physical in October with her PCP.     Activities of Daily Living:  Patient reports morning stiffness for 30 minutes.   Patient Denies nocturnal pain.  Difficulty dressing/grooming: Denies Difficulty climbing stairs: Reports Difficulty getting out of chair: Denies Difficulty using hands for taps, buttons, cutlery, and/or writing: Reports  Review of Systems  Constitutional:  Positive for fatigue.  HENT:  Negative for mouth sores and mouth dryness.   Eyes:  Negative for dryness.  Respiratory:  Positive for shortness of breath.   Cardiovascular:  Positive for chest pain and palpitations.  Gastrointestinal:  Positive for constipation and diarrhea. Negative for blood in stool.  Endocrine: Negative for increased urination.  Genitourinary:  Negative for involuntary  urination.  Musculoskeletal:  Positive for joint pain, joint pain and morning stiffness. Negative for gait problem, joint swelling, myalgias, muscle weakness, muscle tenderness and myalgias.  Skin:  Positive for color change, rash and hair loss. Negative for sensitivity to sunlight.  Allergic/Immunologic: Negative for susceptible to infections.  Neurological:  Positive for light-headedness and headaches. Negative for dizziness.  Hematological:  Negative for swollen glands.  Psychiatric/Behavioral:  Positive for depressed mood. Negative for sleep disturbance. The patient is nervous/anxious.     PMFS History:  Patient Active Problem List   Diagnosis Date Noted  . Coronary artery disease involving native coronary artery of native heart without angina pectoris 06/14/2020  . Aortic atherosclerosis ([place]) 05/06/2020  . Family history of bicuspid aortic valve 05/06/2020  . Dyspnea on exertion 03/31/2020  . Vitamin D deficiency 12/24/2019  . Depression 12/24/2019  . History of tobacco use 08/06/2019  . Allergic rhinitis 04/19/2018  . Obstructive sleep apnea syndrome, mild 04/19/2018  . COPD (chronic obstructive pulmonary disease) ([place]) 03/26/2018  . Rectal bleeding 01/22/2018  . LLQ abdominal pain 01/22/2018  . Dysphagia 01/12/2017  . 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 Primary osteoarthritis of both knees 08/11/2016  . History of hypertension 08/11/2016  . History of ulcerative colitis 08/11/2016  . History of IBS 08/11/2016  . 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 High risk medications (not anticoagulants) long-term use 05/13/2016  . Knee pain, bilateral 05/13/2016  .  Anserine bursitis 05/13/2016  . 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 Chondromalacia patellae, right knee 05/13/2016  . Psoriatic arthritis ([place]) 05/12/2016  . UNSPECIFIED HYPOTHYROIDISM 08/24/2009  . Other hyperlipidemia 08/24/2009  . Essential hypertension 08/24/2009  .  GERD 08/24/2009  . Ulcerative colitis ([place]) 08/24/2009  . IRRITABLE BOWEL SYNDROME 08/24/2009  . OVARIAN CYST 08/24/2009  . PSORIASIS 08/24/2009  . ARTHRITIS 08/24/2009  . PANCREATITIS, HX OF 08/24/2009  . FIBROCYSTIC BREAST DISEASE, HX OF 08/24/2009    Past Medical History:  Diagnosis Date  . Allergy   . Anxiety   . Arthritis   . Asthma   . Back pain   . Cataract    both eyes  surgically repaired  . COPD (chronic obstructive pulmonary disease) ([place])   . Depression    treated  . Dyspnea   . Fibrocystic breast disease   . GERD (gastroesophageal reflux disease)   . History of hiatal hernia    repaired  . History of kidney stones    questionable  . Hyperlipidemia   . Hypertension    pt denies  . Hypothyroidism   . Irritable bowel syndrome   . Joint pain   . Osteoarthritis   . Ovarian cyst   . Palpitations   . Pancreatitis   . PONV (postoperative nausea and vomiting)    NO TROUBLE WITH CATARACT SURGERY  . Psoriasis   . Psoriatic arthritis ([place]) 05/12/2016   Poor response to Humira Inadequate response to Enbrel Inadequate response to Simponi  . Shortness of breath   . Tachycardia   . Ulcerative colitis     Family History  Problem Relation Age of Onset  . Breast cancer Mother 41  . Heart disease Mother   . Colon polyps Mother   . Other Mother        pre-cancerous tumor removed  . Colon cancer Mother   . Hypertension Mother   . Hyperlipidemia Mother   . Cancer Mother   . Depression Mother   . Heart disease Father   . Hypertension Father   . Hyperlipidemia Father   . Stroke Father   . Alcohol abuse Father   . Neuropathy Father   . Crohn's disease Sister   . Alcoholism Sister   . Breast cancer Paternal Aunt   . Breast cancer Maternal Grandmother   . Colon polyps Paternal Grandmother   . Crohn's disease Daughter   . Irritable bowel syndrome Other   . Esophageal cancer Neg Hx   . Rectal cancer Neg Hx   . Stomach cancer Neg Hx   . Pancreatic cancer Neg  Hx    Past Surgical History:  Procedure Laterality Date  . ABDOMINAL HYSTERECTOMY    . ANKLE RECONSTRUCTION     left  . bladder tuck    . CARPAL TUNNEL RELEASE    . CATARACT EXTRACTION W/PHACO Right 09/11/2017   Procedure: CATARACT EXTRACTION PHACO AND INTRAOCULAR LENS PLACEMENT (IOC);  Surgeon: Birder Robson, MD;  Location: [facility];  Service: Ophthalmology;  Laterality: Right;  [place] 00:18.5 AP% 6.9 CDE 1.29 Fluid Pack Lot # [id number]  . CATARACT EXTRACTION W/PHACO Left 10/02/2017   Procedure: CATARACT EXTRACTION PHACO AND INTRAOCULAR LENS PLACEMENT ([place]);  Surgeon: Birder Robson, MD;  Location: [facility];  Service: Ophthalmology;  Laterality: Left;  [place] 00:25.9 AP% 6.9 CDE 1.80 Fluid Pack Lot # [id number]  . CLOSED REDUCTION NASAL FRACTURE N/A 05/21/2018   Procedure: CLOSED REDUCTION NASAL FRACTURE;  Surgeon: Clyde Canterbury, MD;  Location: [facility];  Service: ENT;  Laterality: N/A;  Latex sensitivity  . COLONOSCOPY    . EYE SURGERY     Lasik  . FRACTURE SURGERY    . KNEE SURGERY     left  . LAPAROSCOPIC NISSEN FUNDOPLICATION N/A 37/85/8850   Procedure: LAPAROSCOPIC NISSEN TAKEDOWN AND UPPER ENDOSCOPY;  Surgeon: Johnathan Hausen, MD;  Location: [facility];  Service: General;  Laterality: N/A;  . LEFT HEART CATH AND CORONARY ANGIOGRAPHY N/A 05/10/2020   Procedure: LEFT HEART CATH AND CORONARY ANGIOGRAPHY;  Surgeon: Belva Crome, MD;  Location: [facility];  Service: Cardiovascular;  Laterality: N/A;  . NASAL SEPTUM SURGERY    . NISSEN FUNDOPLICATION    . PARTIAL HYSTERECTOMY    . POLYPECTOMY    . repair cystocele and rectocele    . thyroid ablation    . TUBAL LIGATION    . UPPER GASTROINTESTINAL ENDOSCOPY    . UPPER GI ENDOSCOPY  01/12/2017   Procedure: UPPER GI ENDOSCOPY;  Surgeon: Johnathan Hausen, MD;  Location: [facility];  Service: General;;   Social History   Social History Narrative  . Not on file   Immunization History  Administered Date(s)  Administered  . Influenza Inj Mdck Quad Pf 05/08/2017  . Influenza,inj,Quad PF,6+ Mos 03/08/2018, 03/29/2019, 06/25/2019, 03/31/2020, 04/14/2021  . Influenza-Unspecified 03/31/2020  . PFIZER Comirnaty(Gray Top)Covid-19 Tri-Sucrose Vaccine 09/20/2020  . PFIZER(Purple Top)SARS-COV-2 Vaccination 09/28/2019, 10/20/2019, 03/02/2020  . Zoster Recombinat (Shingrix) 09/06/2020     Objective: Vital Signs: BP 129/84 (BP Location: Left Arm, Patient Position: Sitting, Cuff Size: Large)   Pulse 66   Resp 16   Ht 5' 6"  (1.676 m)   Wt 222 lb (100.7 kg)   BMI 35.83 kg/m    Physical Exam Vitals and nursing note reviewed.  Constitutional:      Appearance: She is well-developed.  HENT:     Head: Normocephalic and atraumatic.  Eyes:     Conjunctiva/sclera: Conjunctivae normal.  Cardiovascular:     Rate and Rhythm: Normal rate and regular rhythm.     Heart sounds: Normal heart sounds.  Pulmonary:     Effort: Pulmonary effort is normal.     Breath sounds: Normal breath sounds.  Abdominal:     General: Bowel sounds are normal.     Palpations: Abdomen is soft.  Musculoskeletal:     Cervical back: Normal range of motion.  Skin:    General: Skin is warm and dry.     Capillary Refill: Capillary refill takes less than 2 seconds.  Neurological:     Mental Status: She is alert and oriented to person, place, and time.  Psychiatric:        Behavior: Behavior normal.     Musculoskeletal Exam: C-spine, thoracic spine, lumbar spine have good range of motion.  No midline spinal tenderness.  Some tenderness over the left SI joint.  Shoulder joints, elbow joints, wrist joints, MCPs, PIPs, DIPs have good range of motion with no synovitis.  Tenderness over the left first and third MCP joints.  Some PIP and DIP thickening consistent with osteoarthritis of both hands.  Complete fist formation noted bilaterally.  Hip joints have good range of motion with no groin pain.  Knee joints have good range of motion with  no warmth or effusion.  Ankle joints have good range of motion with no tenderness or joint swelling.  Tenderness along the Achilles tendon of both feet.  No tenderness or synovitis over MTP joints.  CDAI Exam: CDAI  Score: 0.6  Patient Global: 3 mm; Provider Global: 3 mm Swollen: 0 ; Tender: 0  Joint Exam 03/27/2022   No joint exam has been documented for this visit   There is currently no information documented on the homunculus. Go to the Rheumatology activity and complete the homunculus joint exam.  Investigation: No additional findings.  Imaging: No results found.  Recent Labs: Lab Results  Component Value Date   WBC 5.7 03/16/2022   HGB 12.8 03/16/2022   PLT 287 03/16/2022   NA 136 03/16/2022   K 4.6 03/16/2022   CL 98 03/16/2022   CO2 26 03/16/2022   GLUCOSE 129 (H) 03/16/2022   BUN 9 03/16/2022   CREATININE 0.79 03/16/2022   BILITOT 0.5 03/16/2022   ALKPHOS 57 03/16/2022   AST 30 03/16/2022   ALT 25 03/16/2022   PROT 7.2 03/16/2022   ALBUMIN 4.2 03/16/2022   CALCIUM 9.3 03/16/2022   GFRAA 86 08/10/2020   QFTBGOLDPLUS Negative 10/06/2021    Speciality Comments: Inadequate response to Enbrel, Humira, Simponi,  Remicade-allergic response  Procedures:  No procedures performed Allergies: Demerol [meperidine], Imuran [azathioprine], Meperidine hcl, Remicade [infliximab], Sulfasalazine, Avelox [moxifloxacin], Doxycycline, Erythromycin, and Latex   Assessment / Plan:     Visit Diagnoses: Psoriatic arthritis ([place]): She has no synovitis or dactylitis on examination today.  She has not had any signs or symptoms of a flare.  She has been experiencing increased arthralgias and joint stiffness which she attributes to switching from injectable to oral methotrexate due to the pharmacy shortage 1-1.5 months ago.  She remains on Xeljanz 5 mg 1 tablet twice daily and has not missed any doses recently.  She is tolerating both medications without any side effects.  She has not had  any recent or recurrent infections.  On examination she has some tenderness palpation over the left SI joint as well as tenderness along the Achilles tendon of both feet.  We will try applying for Rasuvo 15 mg subcutaneous injections once weekly to improve the efficacy compared to oral tablets.  She will remain on Xeljanz as prescribed.  She was advised to notify us if she develops increased joint pain or joint swelling.  She will follow-up in the office in 3 months or sooner if needed.  Psoriasis: Plantar aspect of both feet-healing. No new patches. She uses clobetasol ointment twice daily as needed during flares.  High risk medication use - Xeljanz 5 mg 1 tablet by mouth twice daily, methotrexate 0.6 ml sq once weekly, folic acid 2 mg po daily.  CBC and CMP updated on 03/16/22. Her next lab work will be updated in November and every 3 months.  Standing orders for CBC and CMP remain in place.  TB gold negative on 10/06/21.  Lipid panel ordered by PCP every 6 months per patient.  She will have upcoming lab results with her physical faxed to our office to review. FDA BLACK BOX WARNING for major adverse cardiovascular events (MACE), thrombosis, mortality (including sudden cardiovascular death), serious infections, and lymphomas. Patient is a former smoker.    She has not had any recent or recurrent infections.  Discussed the importance of holding xeljanz and methotrexate if she develops signs or symptoms of an infection and to resume once the infection has completely cleared.  Patient is up-to-date with her pneumonia vaccine per patient.  She has had the Shingrix vaccine as well.  Discussed the importance of annual flu shot and COVID-19 booster.  Raynaud's phenomenon without gangrene: Not currently  active.  No digital ulcerations or signs of gangrene.   S/p bilateral carpal tunnel release -She had bilateral carpal tunnel release by Dr. Burney Gauze in May 2023.  Asymptomatic at this time.  Primary  osteoarthritis of both knees: She has good range of motion of both knee joints on examination today.  No warmth or effusion noted.  Achilles tendonitis, bilateral: She continues to have some tenderness along the Achilles tendon of both feet.  DDD (degenerative disc disease), lumbar: No midline spinal tenderness in the lumbar region at this time.  No symptoms of radiculopathy.  History of ulcerative colitis: She has not had any signs or symptoms of an ulcerative colitis flare.  She will remain on Xeljanz as prescribed.  Other medical conditions are listed as follows:  History of gastroesophageal reflux (GERD)  History of hypertension: Blood pressure was 129/84 today in the office.  Discussed the importance of close blood pressure monitoring.  History of IBS  Vitamin D deficiency: She is taking vitamin D 2000 units daily.  Former smoker: Scheduled for chest CT lung cancer screening tomorrow.  Orders: No orders of the defined types were placed in this encounter.  No orders of the defined types were placed in this encounter.     Follow-Up Instructions: Return in about 3 months (around 06/26/2022) for Psoriatic arthritis, Osteoarthritis, DDD.   Ofilia Neas, PA-C  Note - This record has been created using Dragon software.  Chart creation errors have been sought, but may not always  have been located. Such creation errors do not reflect on  the standard of medical care.

## 2022-03-14 ENCOUNTER — Other Ambulatory Visit: Payer: Self-pay | Admitting: *Deleted

## 2022-03-14 ENCOUNTER — Telehealth: Payer: Self-pay | Admitting: Rheumatology

## 2022-03-14 DIAGNOSIS — Z79899 Other long term (current) drug therapy: Secondary | ICD-10-CM

## 2022-03-14 NOTE — Telephone Encounter (Signed)
I called patient, labs released.

## 2022-03-14 NOTE — Telephone Encounter (Signed)
Patient called the office requesting lab orders be sent to [facility]. Patient requests a call once sent.

## 2022-03-16 DIAGNOSIS — Z79899 Other long term (current) drug therapy: Secondary | ICD-10-CM | POA: Diagnosis not present

## 2022-03-17 LAB — CBC WITH DIFFERENTIAL/PLATELET
Basophils Absolute: 0 10*3/uL (ref 0.0–0.2)
Basos: 1 %
EOS (ABSOLUTE): 0.1 10*3/uL (ref 0.0–0.4)
Eos: 1 %
Hematocrit: 38.8 % (ref 34.0–46.6)
Hemoglobin: 12.8 g/dL (ref 11.1–15.9)
Immature Grans (Abs): 0 10*3/uL (ref 0.0–0.1)
Immature Granulocytes: 0 %
Lymphocytes Absolute: 2.2 10*3/uL (ref 0.7–3.1)
Lymphs: 38 %
MCH: 31.8 pg (ref 26.6–33.0)
MCHC: 33 g/dL (ref 31.5–35.7)
MCV: 97 fL (ref 79–97)
Monocytes Absolute: 0.5 10*3/uL (ref 0.1–0.9)
Monocytes: 9 %
Neutrophils Absolute: 2.9 10*3/uL (ref 1.4–7.0)
Neutrophils: 51 %
Platelets: 287 10*3/uL (ref 150–450)
RBC: 4.02 x10E6/uL (ref 3.77–5.28)
RDW: 12.5 % (ref 11.7–15.4)
WBC: 5.7 10*3/uL (ref 3.4–10.8)

## 2022-03-17 LAB — CMP14+EGFR
ALT: 25 IU/L (ref 0–32)
AST: 30 IU/L (ref 0–40)
Albumin/Globulin Ratio: 1.4 (ref 1.2–2.2)
Albumin: 4.2 g/dL (ref 3.8–4.9)
Alkaline Phosphatase: 57 IU/L (ref 44–121)
BUN/Creatinine Ratio: 11 (ref 9–23)
BUN: 9 mg/dL (ref 6–24)
Bilirubin Total: 0.5 mg/dL (ref 0.0–1.2)
CO2: 26 mmol/L (ref 20–29)
Calcium: 9.3 mg/dL (ref 8.7–10.2)
Chloride: 98 mmol/L (ref 96–106)
Creatinine, Ser: 0.79 mg/dL (ref 0.57–1.00)
Globulin, Total: 3 g/dL (ref 1.5–4.5)
Glucose: 129 mg/dL — ABNORMAL HIGH (ref 70–99)
Potassium: 4.6 mmol/L (ref 3.5–5.2)
Sodium: 136 mmol/L (ref 134–144)
Total Protein: 7.2 g/dL (ref 6.0–8.5)
eGFR: 88 mL/min/{1.73_m2} (ref >59)

## 2022-03-27 ENCOUNTER — Encounter: Payer: Self-pay | Admitting: Physician Assistant

## 2022-03-27 ENCOUNTER — Telehealth: Payer: Self-pay | Admitting: Pharmacist

## 2022-03-27 ENCOUNTER — Ambulatory Visit: Payer: Medicare HMO | Attending: Physician Assistant | Admitting: Physician Assistant

## 2022-03-27 VITALS — BP 129/84 | HR 66 | Resp 16 | Ht 66.0 in | Wt 222.0 lb

## 2022-03-27 DIAGNOSIS — I7300 Raynaud's syndrome without gangrene: Secondary | ICD-10-CM | POA: Diagnosis not present

## 2022-03-27 DIAGNOSIS — L4050 Arthropathic psoriasis, unspecified: Secondary | ICD-10-CM

## 2022-03-27 DIAGNOSIS — Z8719 Personal history of other diseases of the digestive system: Secondary | ICD-10-CM

## 2022-03-27 DIAGNOSIS — M170 Bilateral primary osteoarthritis of knee: Secondary | ICD-10-CM | POA: Diagnosis not present

## 2022-03-27 DIAGNOSIS — M7662 Achilles tendinitis, left leg: Secondary | ICD-10-CM

## 2022-03-27 DIAGNOSIS — L409 Psoriasis, unspecified: Secondary | ICD-10-CM | POA: Diagnosis not present

## 2022-03-27 DIAGNOSIS — M5136 Other intervertebral disc degeneration, lumbar region: Secondary | ICD-10-CM

## 2022-03-27 DIAGNOSIS — Z9889 Other specified postprocedural states: Secondary | ICD-10-CM

## 2022-03-27 DIAGNOSIS — E559 Vitamin D deficiency, unspecified: Secondary | ICD-10-CM

## 2022-03-27 DIAGNOSIS — Z79899 Other long term (current) drug therapy: Secondary | ICD-10-CM | POA: Diagnosis not present

## 2022-03-27 DIAGNOSIS — Z87891 Personal history of nicotine dependence: Secondary | ICD-10-CM

## 2022-03-27 DIAGNOSIS — M7661 Achilles tendinitis, right leg: Secondary | ICD-10-CM

## 2022-03-27 DIAGNOSIS — Z8679 Personal history of other diseases of the circulatory system: Secondary | ICD-10-CM

## 2022-03-27 NOTE — Telephone Encounter (Addendum)
Please start Rasuvo/Otrexup BIV. If Rasuvo is too expensive after approved thru insurance, will need to speak with pt regarding interest in applying to [employer] patient assistance program (pt does not qualify for copay card).  Dose: 56m SQ every 7 days  Dx: L40.5, L40.9  Previously tried therapies: Oral MTX - inadequate response  ----- Message from MAmherstsent at 03/27/2022 11:12 AM EDT ----- Please apply for Rasuvo, per THazel Sams PA-C. Tablets ineffective. Thanks!

## 2022-03-27 NOTE — Patient Instructions (Addendum)
Standing Labs We placed an order today for your standing lab work.   Please have your standing labs drawn in November and every 3 months   If possible, please have your labs drawn 2 weeks prior to your appointment so that the provider can discuss your results at your appointment.  Please note that you may see your imaging and lab results in [place] before we have reviewed them. We may be awaiting multiple results to interpret others before contacting you. Please allow our office up to 72 hours to thoroughly review all of the results before contacting the office for clarification of your results.  We currently have open lab daily: Monday through Thursday from 1:30 PM-4:30 PM and Friday from 1:30 PM- 4:00 PM If possible, please come for your lab work on Monday, Thursday or Friday afternoons, as you may experience shorter wait times.   Effective May 17, 2022 the new lab hours will change to: Monday through Thursday from 1:30 PM-5:00 PM and Friday from 8:30 AM-12:00 PM If possible, please come for your lab work on Monday and Thursday afternoons, as you may experience shorter wait times.  Please be advised, all patients with office appointments requiring lab work will take precedent over walk-in lab work.    The office is located at [address] [place], [place], [place] [address] No appointment is necessary.   Labs are drawn by [facility]. Please bring your co-pay at the time of your lab draw.  You may receive a bill from [facility] for your lab work.  Please note if you are on Hydroxychloroquine and and an order has been placed for a Hydroxychloroquine level, you will need to have it drawn 4 hours or more after your last dose.  If you wish to have your labs drawn at another location, please call the office 24 hours in advance to send orders.  If you have any questions regarding directions or hours of operation,  please call [phone].   As a reminder, please drink plenty of water prior  to coming for your lab work. Thanks! If you have signs or symptoms of an infection or start antibiotics: First, call your PCP for workup of your infection. Hold your medication through the infection, until you complete your antibiotics, and until symptoms resolve if you take the following: Injectable medication (Actemra, Benlysta, Cimzia, Cosentyx, Enbrel, Humira, Kevzara, Orencia, Remicade, Simponi, Stelara, Taltz, Tremfya) Methotrexate Leflunomide (Arava) Mycophenolate (Cellcept) Morrie Sheldon, Olumiant, or Rinvoq  Vaccines You are taking a medication(s) that can suppress your immune system.  The following immunizations are recommended: Flu annually Covid-19  Td/Tdap (tetanus, diphtheria, pertussis) every 10 years Pneumonia (Prevnar 15 then Pneumovax 23 at least 1 year apart.  Alternatively, can take Prevnar 20 without needing additional dose) Shingrix: 2 doses from 4 weeks to 6 months apart  Please check with your PCP to make sure you are up to date.

## 2022-03-28 ENCOUNTER — Ambulatory Visit
Admission: RE | Admit: 2022-03-28 | Discharge: 2022-03-28 | Disposition: A | Discharge status: Home / Self Care | Payer: Medicare HMO | Source: Ambulatory Visit | Attending: Acute Care | Admitting: Acute Care

## 2022-03-28 DIAGNOSIS — Z122 Encounter for screening for malignant neoplasm of respiratory organs: Secondary | ICD-10-CM | POA: Diagnosis not present

## 2022-03-28 DIAGNOSIS — J439 Emphysema, unspecified: Secondary | ICD-10-CM | POA: Diagnosis not present

## 2022-03-28 DIAGNOSIS — I700 Atherosclerosis of aorta: Secondary | ICD-10-CM | POA: Diagnosis not present

## 2022-03-28 DIAGNOSIS — I2510 Atherosclerotic heart disease of native coronary artery without angina pectoris: Secondary | ICD-10-CM | POA: Diagnosis not present

## 2022-03-28 DIAGNOSIS — Z87891 Personal history of nicotine dependence: Secondary | ICD-10-CM | POA: Insufficient documentation

## 2022-03-28 DIAGNOSIS — K760 Fatty (change of) liver, not elsewhere classified: Secondary | ICD-10-CM | POA: Insufficient documentation

## 2022-03-29 ENCOUNTER — Other Ambulatory Visit: Payer: Self-pay

## 2022-03-29 DIAGNOSIS — Z87891 Personal history of nicotine dependence: Secondary | ICD-10-CM

## 2022-03-29 DIAGNOSIS — Z122 Encounter for screening for malignant neoplasm of respiratory organs: Secondary | ICD-10-CM

## 2022-03-31 NOTE — Telephone Encounter (Signed)
Submitted a Prior Authorization request to [facility] for RASUVO via CoverMyMeds. Will update once we receive a response.  Key: [id number]  Knox Saliva, PharmD, MPH, BCPS, CPP Clinical Pharmacist (Rheumatology and Pulmonology)

## 2022-04-03 ENCOUNTER — Other Ambulatory Visit (HOSPITAL_COMMUNITY): Payer: Self-pay

## 2022-04-03 NOTE — Telephone Encounter (Signed)
Received notification from [facility] regarding a prior authorization for RASUVO. Authorization has been APPROVED from 03/31/22 to 07/16/22.   Per test claim, copay for 28 days supply is $95  Patient can fill through [place]: [phone]   Authorization # [id number]  Patient states she'd like to try for Rasuvo patient assistance. Emailed her portion of application today. Provider portion placed in folder to be signed by Hazel Sams, PA-C  Knox Saliva, PharmD, MPH, BCPS, CPP Clinical Pharmacist (Rheumatology and Pulmonology)

## 2022-04-04 NOTE — Telephone Encounter (Signed)
Signed provider form for Rasuvo PAP application received. Will place in PAP pending info folder in pharmacy office while we await patient's portion  Knox Saliva, PharmD, MPH, BCPS, CPP Clinical Pharmacist (Rheumatology and Pulmonology)

## 2022-04-24 ENCOUNTER — Other Ambulatory Visit: Payer: Self-pay | Admitting: Physician Assistant

## 2022-04-25 NOTE — Telephone Encounter (Signed)
Next Visit: 06/26/2022  Last Visit: 03/27/2022  Last Fill: 01/27/2022  IG:[id number] arthritis   Current Dose per office note 03/27/2022: Morrie Sheldon 5 mg 1 tablet twice daily   Labs: 03/16/2022 Glucose is 129. Rest of CMP WNL.  CBC WNL.   TB Gold: 10/06/2021 Neg  Okay to refill xeljanz?

## 2022-04-26 ENCOUNTER — Other Ambulatory Visit: Payer: Self-pay | Admitting: Rheumatology

## 2022-04-27 NOTE — Telephone Encounter (Signed)
Spoke with patient - she states she'd like to stay on tablets until backlog of MTX vials clears up. She is not interested in applying for Rasuvo patient assistance. Will close encounter.  Knox Saliva, PharmD, MPH, BCPS, CPP Clinical Pharmacist (Rheumatology and Pulmonology)

## 2022-04-27 NOTE — Telephone Encounter (Signed)
Next Visit: 06/26/2022  Last Visit: 03/27/2022  Last Fill: 02/16/2022  DX: Psoriatic arthritis   Current Dose per office note 03/27/2022: methotrexate 0.6 ml sq once weekly (Patient on tablets until approved for Rasuvo patient assistance)  Labs: 03/16/2022 Glucose is 129. Rest of CMP WNL.  CBC WNL.   Okay to refill MTX?

## 2022-05-09 ENCOUNTER — Telehealth: Payer: Self-pay | Admitting: Pharmacist

## 2022-05-09 NOTE — Telephone Encounter (Signed)
Received fax from [facility] pt assistance stating that renewal application is requiring provider portion. Patient portion has been completed by pt but was missing information. They have been in contact with patient on 04/27/22 regarding missing information.  Provider portion placed in Hazel Sams, PA-C's folder today for signature.  Knox Saliva, PharmD, MPH, BCPS, CPP Clinical Pharmacist (Rheumatology and Pulmonology)

## 2022-05-11 DIAGNOSIS — E669 Obesity, unspecified: Secondary | ICD-10-CM | POA: Diagnosis not present

## 2022-05-11 DIAGNOSIS — Z01818 Encounter for other preprocedural examination: Secondary | ICD-10-CM | POA: Diagnosis not present

## 2022-05-11 DIAGNOSIS — Z8719 Personal history of other diseases of the digestive system: Secondary | ICD-10-CM | POA: Diagnosis not present

## 2022-05-11 DIAGNOSIS — R7303 Prediabetes: Secondary | ICD-10-CM | POA: Diagnosis not present

## 2022-05-11 DIAGNOSIS — I10 Essential (primary) hypertension: Secondary | ICD-10-CM | POA: Diagnosis not present

## 2022-05-11 DIAGNOSIS — E785 Hyperlipidemia, unspecified: Secondary | ICD-10-CM | POA: Diagnosis not present

## 2022-05-11 DIAGNOSIS — K219 Gastro-esophageal reflux disease without esophagitis: Secondary | ICD-10-CM | POA: Diagnosis not present

## 2022-05-16 DIAGNOSIS — L4050 Arthropathic psoriasis, unspecified: Secondary | ICD-10-CM | POA: Diagnosis not present

## 2022-05-16 DIAGNOSIS — E7800 Pure hypercholesterolemia, unspecified: Secondary | ICD-10-CM | POA: Diagnosis not present

## 2022-05-16 DIAGNOSIS — F322 Major depressive disorder, single episode, severe without psychotic features: Secondary | ICD-10-CM | POA: Diagnosis not present

## 2022-05-16 DIAGNOSIS — I2510 Atherosclerotic heart disease of native coronary artery without angina pectoris: Secondary | ICD-10-CM | POA: Diagnosis not present

## 2022-05-16 DIAGNOSIS — E039 Hypothyroidism, unspecified: Secondary | ICD-10-CM | POA: Diagnosis not present

## 2022-05-16 DIAGNOSIS — Z23 Encounter for immunization: Secondary | ICD-10-CM | POA: Diagnosis not present

## 2022-05-16 DIAGNOSIS — R7303 Prediabetes: Secondary | ICD-10-CM | POA: Diagnosis not present

## 2022-05-16 DIAGNOSIS — J449 Chronic obstructive pulmonary disease, unspecified: Secondary | ICD-10-CM | POA: Diagnosis not present

## 2022-05-16 DIAGNOSIS — K5190 Ulcerative colitis, unspecified, without complications: Secondary | ICD-10-CM | POA: Diagnosis not present

## 2022-05-16 DIAGNOSIS — Z0000 Encounter for general adult medical examination without abnormal findings: Secondary | ICD-10-CM | POA: Diagnosis not present

## 2022-05-16 DIAGNOSIS — I10 Essential (primary) hypertension: Secondary | ICD-10-CM | POA: Diagnosis not present

## 2022-05-17 NOTE — Telephone Encounter (Signed)
Signed provider form received.  Submitted a Prior Authorization RENEWAL request to [facility] for [facility] via CoverMyMeds. Will need to attach PA approval letter to application for submission  Key; [id number]  Knox Saliva, PharmD, MPH, BCPS, CPP Clinical Pharmacist (Rheumatology and Pulmonology)

## 2022-05-19 ENCOUNTER — Telehealth: Payer: Self-pay | Admitting: *Deleted

## 2022-05-19 NOTE — Telephone Encounter (Signed)
Received a fax regarding Prior Authorization from [facility] for [facility]. Authorization has been DENIED because: The preferred drug(s), you may not have tried, are: Skyrizi and Rinvoq. Your provider needs to give [place] medical reasons why the preferred drug(s) would not work for you and/or would have bad side effects. Sometimes a preferred drug needs more review for approval. Additionally, some preferred drugs listed may be the same drugs with different strengths or forms. [employer] may only require one strength or form of that drug to be tried. This decision was from [facility] Non-Formulary Exceptions Coverage Policy. Additionally, the Medicare rule in the Prescription Drug Manual (Chapter 14, Appendix E) says drugs covered under a Pharmaceutical Assistance Program (PAP) cannot be covered under a Part D plan.  Will submit appeal  Knox Saliva, PharmD, MPH, BCPS, CPP Clinical Pharmacist (Rheumatology and Pulmonology)

## 2022-05-19 NOTE — Telephone Encounter (Signed)
Labs received from:[facility] on:05/16/2022  Reviewed by:Hazel Sams, PA-C  Labs drawn:Hgb A1C, CMP, Microalbumin Panel, TSH, Lipid Panel  Results:Hgb A1C 6.0  Rest of labs WNL  Patient is on Xeljanz 5 mg 1 tab po BID and MTX 6 tabs po once weekly.

## 2022-05-22 ENCOUNTER — Encounter: Payer: Self-pay | Admitting: Dietician

## 2022-05-22 ENCOUNTER — Encounter: Payer: Medicare HMO | Attending: Surgery | Admitting: Dietician

## 2022-05-22 VITALS — Ht 66.0 in | Wt 229.1 lb

## 2022-05-22 DIAGNOSIS — Z6836 Body mass index (BMI) 36.0-36.9, adult: Secondary | ICD-10-CM | POA: Insufficient documentation

## 2022-05-22 DIAGNOSIS — I10 Essential (primary) hypertension: Secondary | ICD-10-CM | POA: Diagnosis not present

## 2022-05-22 DIAGNOSIS — J449 Chronic obstructive pulmonary disease, unspecified: Secondary | ICD-10-CM | POA: Insufficient documentation

## 2022-05-22 DIAGNOSIS — E669 Obesity, unspecified: Secondary | ICD-10-CM | POA: Insufficient documentation

## 2022-05-22 DIAGNOSIS — Z713 Dietary counseling and surveillance: Secondary | ICD-10-CM | POA: Insufficient documentation

## 2022-05-22 DIAGNOSIS — E7800 Pure hypercholesterolemia, unspecified: Secondary | ICD-10-CM | POA: Diagnosis not present

## 2022-05-22 NOTE — Progress Notes (Signed)
Nutrition Assessment for Bariatric Surgery Medical Nutrition Therapy Appt Start Time: 3:25    End Time: 4:35  Patient was seen on 05/22/2022 for Pre-Operative Nutrition Assessment. Letter of approval faxed to [facility] bariatric surgery program coordinator on 05/22/2022.   Referral stated Supervised Weight Loss (SWL) visits needed: 6 months  Planned surgery: Sleeve Gastrectomy    NUTRITION ASSESSMENT   Anthropometrics  Start weight at [facility]: 229.1 lbs (date: 05/22/2022)  Height: 66 in BMI: 36.98 kg/m2     Clinical  Medical hx: COPD, GI, HTN, hypercholesterolemia, obesity, sleep apnea Medications: omeprazole, tofacitinib, cholecalciferol, escitalopram oxalate, estradiol, levocetirizine, levothyroxine, methotrexate, metoprolol succinate, montelukast, rosuvastatin  Labs: pt reports A1C 6.0 (05/16/2022) Notable signs/symptoms: none noted Any previous deficiencies? No  Micronutrient Nutrition Focused Physical Exam: Hair: No issues observed Eyes: No issues observed Mouth: No issues observed Neck: No issues observed Nails: No issues observed Skin: No issues observed  Lifestyle & Dietary Hx  Patient lives with husband. The pt performs the food shopping and the pt prepares the meals. She reports that she typically skips or misses 9-10 out of 21 possible meals per week. She may have 1 meal per week that are take-out or at a restaurant.  Patient states she does not work. She denies binge eating though has felt shame and/or guilt after eating too much food.  She denies having used laxatives or vomiting to facilitate weight loss. She admits to emotional eating during times of boredom. She states that she does not know the difference between hunger and thirst and cannot tell when she is full. Pt states she really started gaining weight after her thyroid oblations.  Pt states she does not do carbonated drinks, due to previous surgery (lap nissen fundoplication). Pt states she  more of a grazer when it comes to food.  Physical Activity: ADLs  Sleep Hygiene: duration and quality: good, at least 6 hours, 7-8 hours night  Current Patient Perceived Stress Level as stated by pt on a scale of 1-10:  2      Stress Management Techniques: remove self from situation  Fruit servings per week: 0-2 Non starchy vegetable servings per week: 5-6 Whole Grains per week: 2-3  24-Hr Dietary Recall First Meal: coffee (2 cups), zero sugar creamer, skip Snack:  Second Meal: sandwich or snack Snack: granola bar or peanut butter prezels Third Meal: vegetable soup and grilled cheese sandwich or meat and vegetable or farro (grain) Snack: popcorn Beverages: coffee, water, diet green tea, iced coffee  Alcoholic beverages per week: 0   Estimated Energy Needs Calories: 1500  NUTRITION DIAGNOSIS  Overweight/obesity ([place]-3.3) related to past poor dietary habits and physical inactivity as evidenced by patient w/ planned sleeve surgery following dietary guidelines for continued weight loss.  NUTRITION INTERVENTION  Nutrition counseling (C-1) and education (E-2) to facilitate bariatric surgery goals.  Educated pt on micronutrient deficiencies post surgery and strategies to mitigate that risk   Pre-Op Goals Reviewed with the Patient Track food and beverage intake (pen and paper, MyFitness Pal, Baritastic app, etc.) Make healthy food choices while monitoring portion sizes Consume 3 meals per day or try to eat every 3-5 hours Avoid concentrated sugars and fried foods Keep sugar & fat in the single digits per serving on food labels Practice CHEWING your food (aim for applesauce consistency) Practice not drinking 15 minutes before, during, and 30 minutes after each meal and snack Avoid all carbonated beverages (ex: soda, sparkling beverages)  Limit caffeinated beverages (ex: coffee, tea, energy  drinks) Avoid all sugar-sweetened beverages (ex: regular soda, sports drinks)  Avoid alcohol   Aim for 64-100 ounces of FLUID daily (with at least half of fluid intake being plain water)  Aim for at least 60-80 grams of PROTEIN daily Look for a liquid protein source that contains ?15 g protein and ?5 g carbohydrate (ex: shakes, drinks, shots) Make a list of non-food related activities Physical activity is an important part of a healthy lifestyle so keep it moving! The goal is to reach 150 minutes of exercise per week, including cardiovascular and weight baring activity.  *Goals that are bolded indicate the pt would like to start working towards these  Handouts Provided Include  Bariatric Surgery handouts (Nutrition Visits, Pre-Op Goals, Protein Shakes, Vitamins & Minerals)  Learning Style & Readiness for Change Teaching method utilized: Visual & Auditory  Demonstrated degree of understanding via: Teach Back  Readiness Level: preparation Barriers to learning/adherence to lifestyle change: nothing identified  RD's Notes for Next Visit Progress toward patients chosen goals     MONITORING & EVALUATION Dietary intake, weekly physical activity, body weight, and pre-op goals reached at next nutrition visit.    Next Steps  Patient is to follow up at [facility] next month for SWL visit.

## 2022-05-22 NOTE — Telephone Encounter (Signed)
Submitted an URGENT appeal to [facility] for [employer].  Reference # [id number] Phone: [phone] Fax: [phone]  Knox Saliva, PharmD, MPH, BCPS, CPP Clinical Pharmacist (Rheumatology and Pulmonology)

## 2022-05-24 NOTE — Telephone Encounter (Signed)
Needs 2024 re-enrollment for [employer] [phone] phone

## 2022-05-29 NOTE — Telephone Encounter (Signed)
Submitted Patient Assistance Application to [employer] for [employer] along with provider portion, med list, and insurance card copy. Will update patient when we receive a response.  Fax# [phone] Phone# [phone]  Knox Saliva, PharmD, MPH, BCPS, CPP Clinical Pharmacist (Rheumatology and Pulmonology)

## 2022-05-30 IMAGING — MR MR KNEE*L* W/O CM
4 of 5 series · 24 of 40 positions shown · non-contrast
Comparison: Radiographs 08/15/2018.  MRI 12/16/2004.

CLINICAL DATA: Chronic knee pain with tightness, numbness and
weakness since falling 3-4 years ago. Remote arthroscopy.

EXAM:
MRI OF THE LEFT KNEE WITHOUT CONTRAST
TECHNIQUE: Multiplanar, multisequence MR imaging of the knee was performed. No
intravenous contrast was administered.

[Series 3: T2 fat-sat · axial · 4.0mm · 0.50mm/px · z∈[-101,+14]mm · 8 of 24 slices shown (1 of 3)]
[im 1/24]
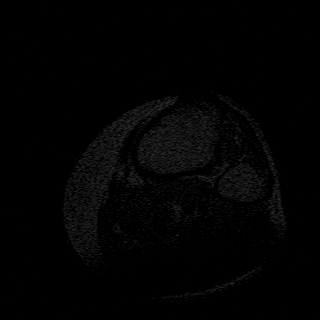
[im 4/24]
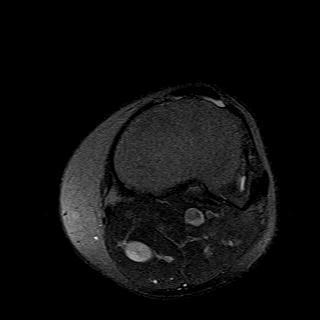
[im 7/24]
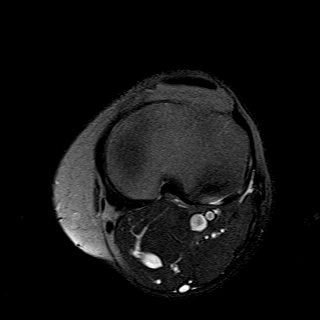
[im 10/24]
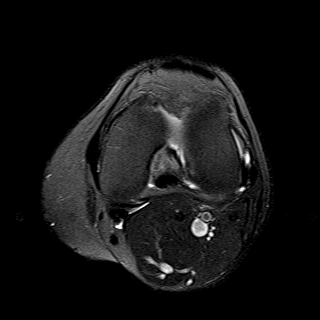
[im 14/24]
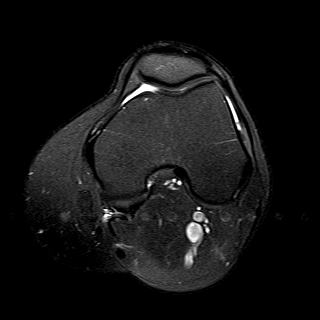
[im 17/24]
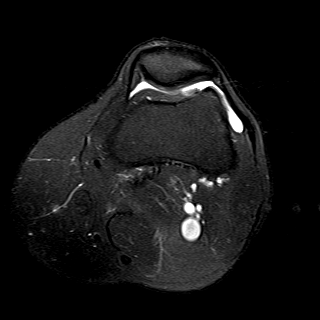
[im 20/24]
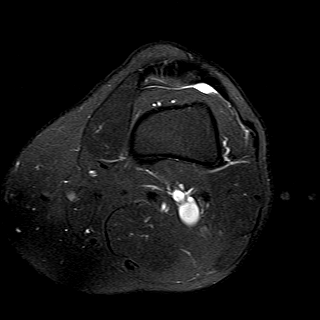
[im 24/24]
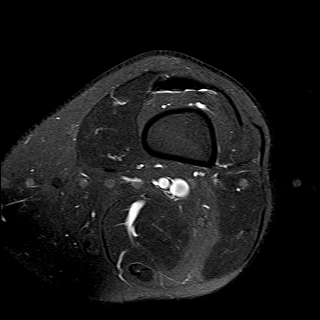

[Series 5: T2 fat-sat · coronal · 4.0mm · 0.29mm/px · 4 of 22 slices shown (2 of 3)]
[im 1/22]
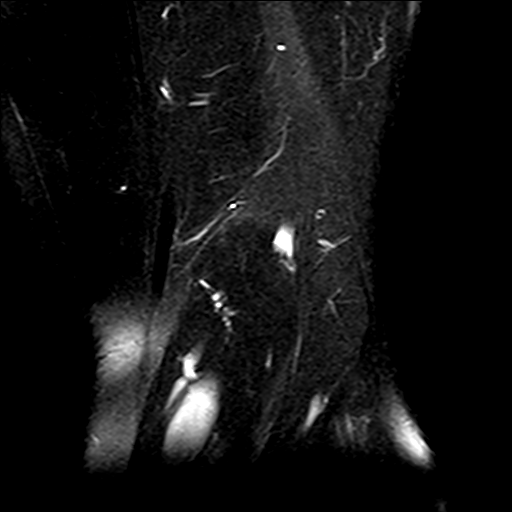
[im 4/22]
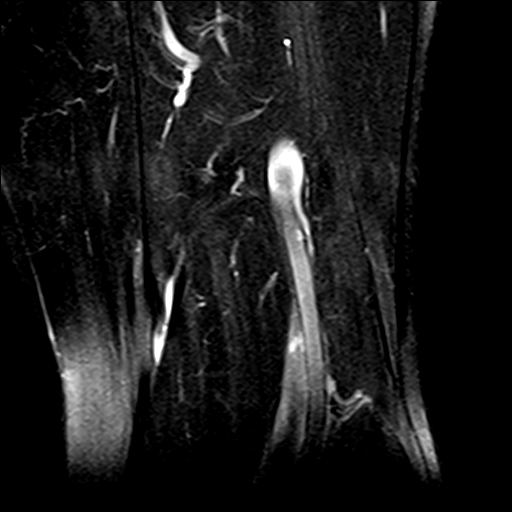
[im 11/22]
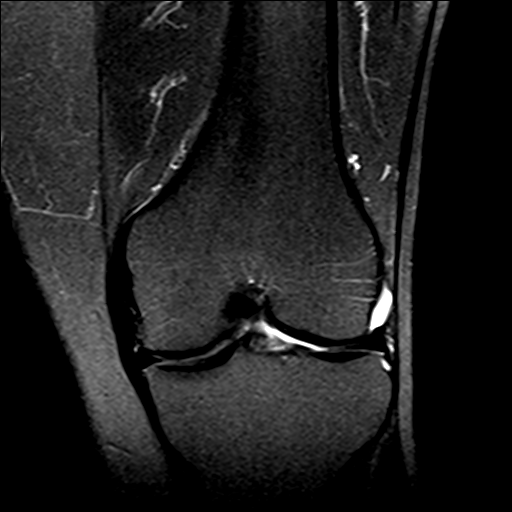
[im 18/22]
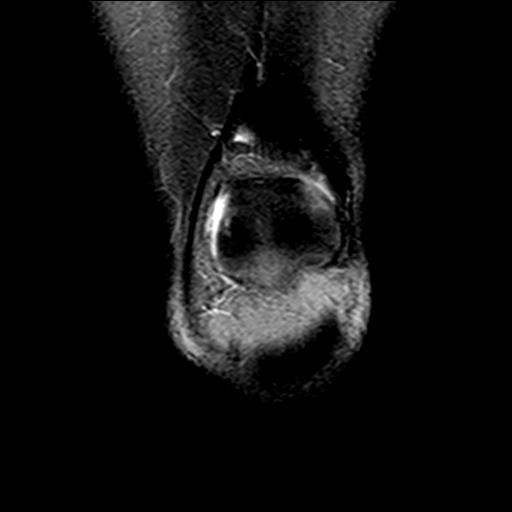

[Series 6: T2 fat-sat · sagittal · 3.0mm · 0.29mm/px · 3 of 29 slices shown (3 of 3)]
[im 4/29]
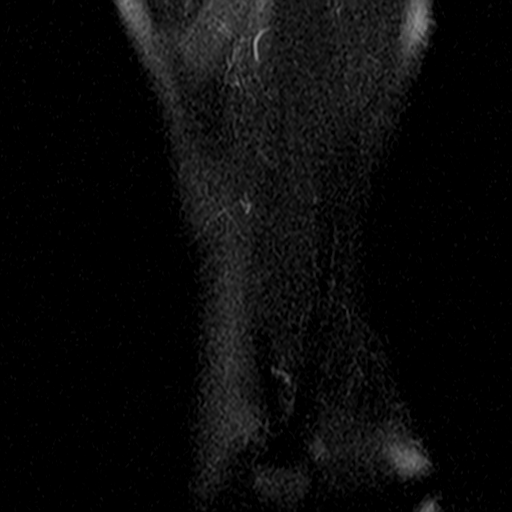
[im 15/29]
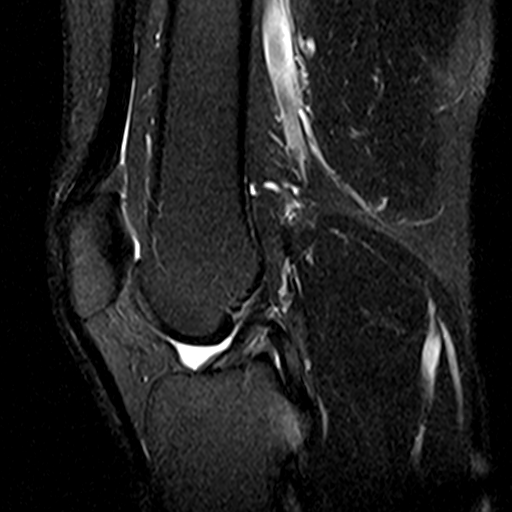
[im 25/29]
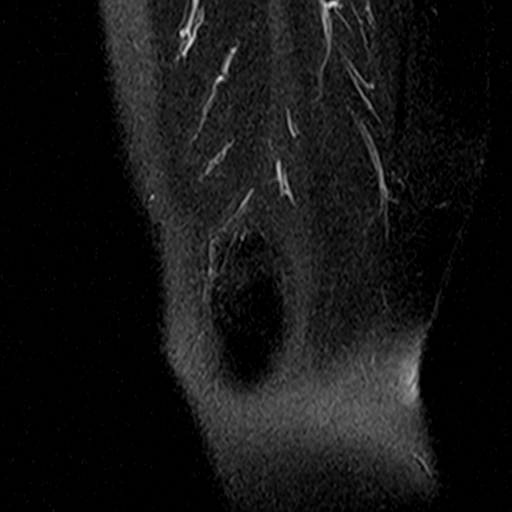

[Series 7: PD fat-sat · sagittal · 3.0mm · 0.29mm/px · 9 of 29 slices shown]
[im 1/29]
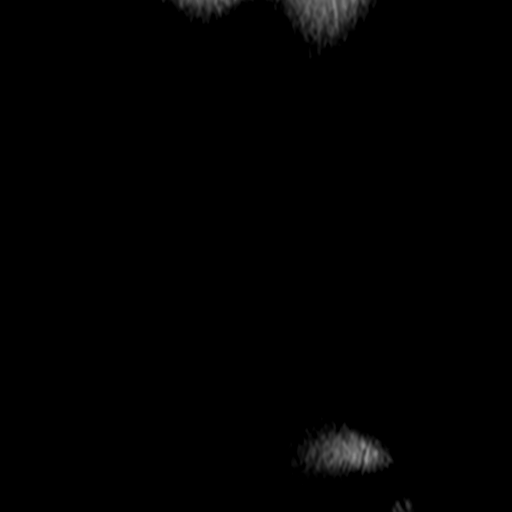
[im 4/29]
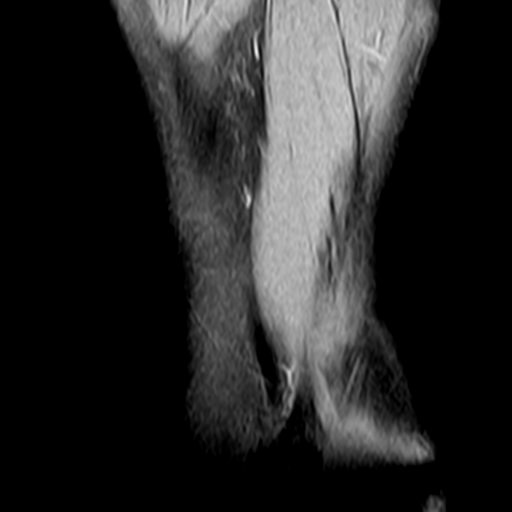
[im 8/29]
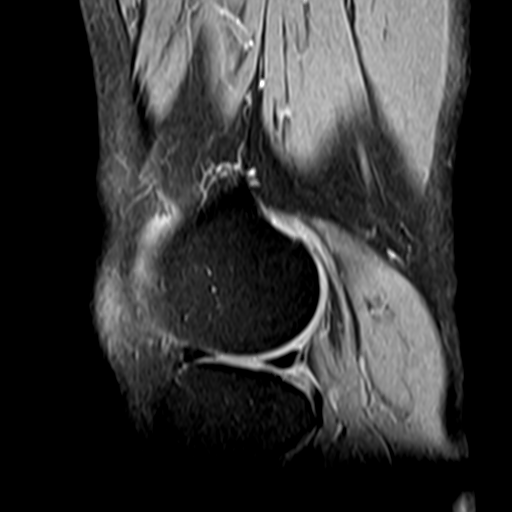
[im 11/29]
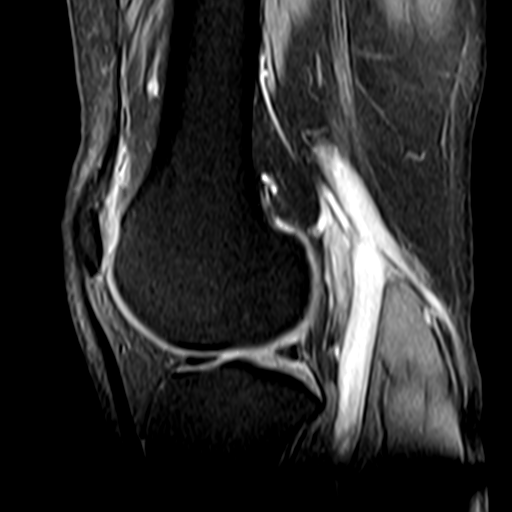
[im 15/29]
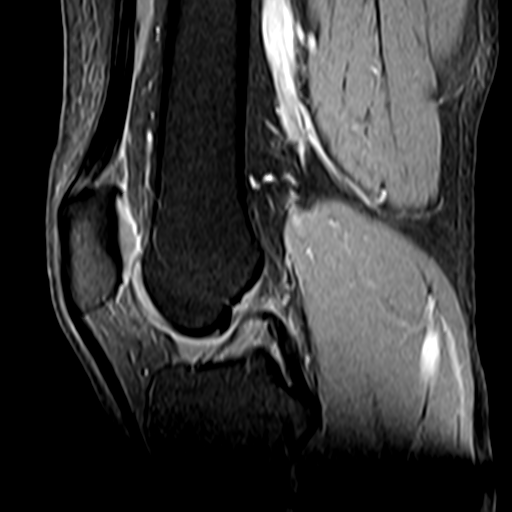
[im 18/29]
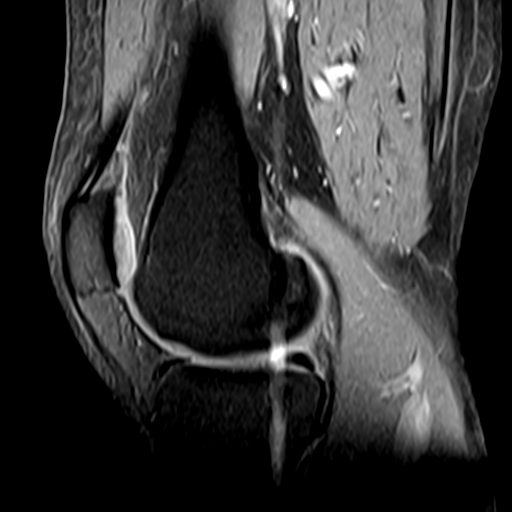
[im 22/29]
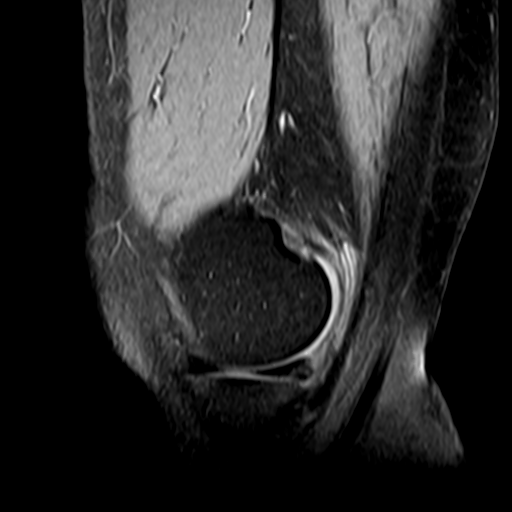
[im 25/29]
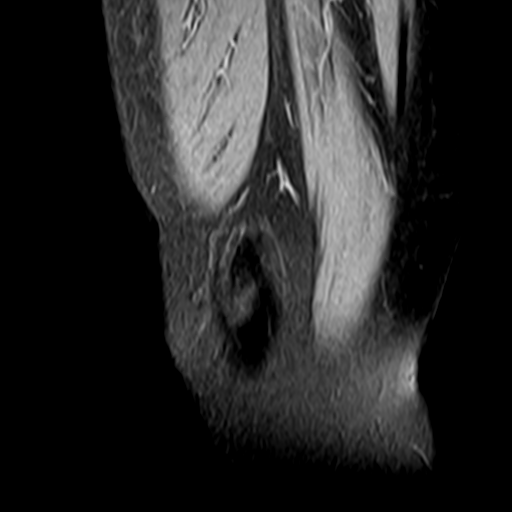
[im 29/29]
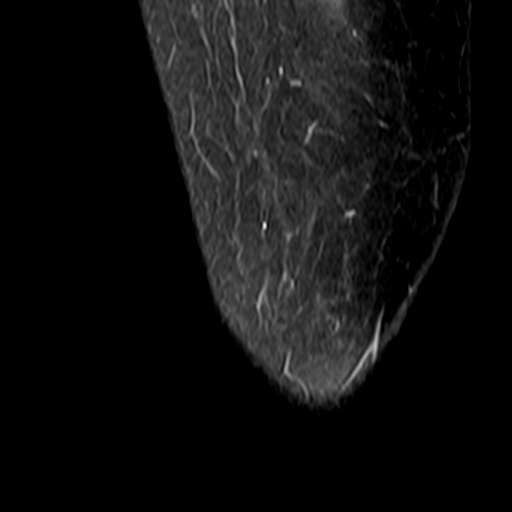

[24 of 40 positions shown; findings below may reference images not displayed]

FINDINGS: MENISCI

Medial meniscus:  Intact with normal morphology.

Lateral meniscus:  Intact with normal morphology.

LIGAMENTS

Cruciates:  Intact.

Collaterals: Intact. Mild MCL thickening may relate to remote
injury.

CARTILAGE

Patellofemoral:  Preserved.

Medial: Stable mild chondral thinning without focal defect or
subchondral signal abnormality.

Lateral:  Preserved.

MISCELLANEOUS

Joint:  No significant joint effusion.

Popliteal Fossa:  Tiny Baker's cyst.

Extensor Mechanism:  Intact.

Bones:  No acute or significant extra-articular osseous findings.

Other: No other significant periarticular soft tissue findings.
IMPRESSION: 1. No acute findings or evidence of internal derangement. The
menisci, cruciate and collateral ligaments are intact.
2. Stable mild medial compartment degenerative chondrosis.

## 2022-06-13 NOTE — Progress Notes (Deleted)
Office Visit Note  Patient: [name]             Date of Birth: 1966-01-03           MRN: [id number]             PCP: Mayra Neer, MD Referring: Mayra Neer, MD Visit Date: 06/26/2022 Occupation: @GUAROCC @  Subjective:  No chief complaint on file.   History of Present Illness: [name] is a 56 y.o. female ***   Activities of Daily Living:  Patient reports morning stiffness for *** {minute/hour:19697}.   Patient {ACTIONS;DENIES/REPORTS:21021675::"Denies"} nocturnal pain.  Difficulty dressing/grooming: {ACTIONS;DENIES/REPORTS:21021675::"Denies"} Difficulty climbing stairs: {ACTIONS;DENIES/REPORTS:21021675::"Denies"} Difficulty getting out of chair: {ACTIONS;DENIES/REPORTS:21021675::"Denies"} Difficulty using hands for taps, buttons, cutlery, and/or writing: {ACTIONS;DENIES/REPORTS:21021675::"Denies"}  No Rheumatology ROS completed.   PMFS History:  Patient Active Problem List   Diagnosis Date Noted   Coronary artery disease involving native coronary artery of native heart without angina pectoris 06/14/2020   Aortic atherosclerosis ([place]) 05/06/2020   Family history of bicuspid aortic valve 05/06/2020   Dyspnea on exertion 03/31/2020   Vitamin D deficiency 12/24/2019   Depression 12/24/2019   History of tobacco use 08/06/2019   Allergic rhinitis 04/19/2018   Obstructive sleep apnea syndrome, mild 04/19/2018   COPD (chronic obstructive pulmonary disease) ([place]) 03/26/2018   Rectal bleeding 01/22/2018   LLQ abdominal pain 01/22/2018   Dysphagia 01/12/2017   Spondylosis of lumbar region without myelopathy or radiculopathy 08/11/2016   Primary osteoarthritis of both knees 08/11/2016   History of hypertension 08/11/2016   History of ulcerative colitis 08/11/2016   History of IBS 08/11/2016   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   High risk medications (not anticoagulants) long-term use 05/13/2016   Knee pain, bilateral 05/13/2016   Anserine bursitis 05/13/2016    Chondromalacia patellae, left knee 05/13/2016   Chondromalacia patellae, right knee 05/13/2016   Psoriatic arthritis ([place]) 05/12/2016   UNSPECIFIED HYPOTHYROIDISM 08/24/2009   Other hyperlipidemia 08/24/2009   Essential hypertension 08/24/2009   GERD 08/24/2009   Ulcerative colitis ([place]) 08/24/2009   IRRITABLE BOWEL SYNDROME 08/24/2009   OVARIAN CYST 08/24/2009   PSORIASIS 08/24/2009   ARTHRITIS 08/24/2009   PANCREATITIS, HX OF 08/24/2009   FIBROCYSTIC BREAST DISEASE, HX OF 08/24/2009    Past Medical History:  Diagnosis Date   Allergy    Anxiety    Arthritis    Asthma    Back pain    Cataract    both eyes  surgically repaired   COPD (chronic obstructive pulmonary disease) ([place])    Depression    treated   Dyspnea    Fibrocystic breast disease    GERD (gastroesophageal reflux disease)    History of hiatal hernia    repaired   History of kidney stones    questionable   Hyperlipidemia    Hypertension    pt denies   Hypothyroidism    Irritable bowel syndrome    Joint pain    Osteoarthritis    Ovarian cyst    Palpitations    Pancreatitis    PONV (postoperative nausea and vomiting)    NO TROUBLE WITH CATARACT SURGERY   Psoriasis    Psoriatic arthritis ([place]) 05/12/2016   Poor response to Humira Inadequate response to Enbrel Inadequate response to Simponi   Shortness of breath    Tachycardia    Ulcerative colitis     Family History  Problem Relation Age of Onset   Breast cancer Mother 35   Heart disease Mother  Colon polyps Mother    Other Mother        pre-cancerous tumor removed   Colon cancer Mother    Hypertension Mother    Hyperlipidemia Mother    Cancer Mother    Depression Mother    Heart disease Father    Hypertension Father    Hyperlipidemia Father    Stroke Father    Alcohol abuse Father    Neuropathy Father    Crohn's disease Sister    Alcoholism Sister    Breast cancer Paternal Aunt    Breast cancer Maternal Grandmother    Colon  polyps Paternal Grandmother    Crohn's disease Daughter    Irritable bowel syndrome Other    Esophageal cancer Neg Hx    Rectal cancer Neg Hx    Stomach cancer Neg Hx    Pancreatic cancer Neg Hx    Past Surgical History:  Procedure Laterality Date   ABDOMINAL HYSTERECTOMY     ANKLE RECONSTRUCTION     left   bladder tuck     CARPAL TUNNEL RELEASE     CATARACT EXTRACTION W/PHACO Right 09/11/2017   Procedure: CATARACT EXTRACTION PHACO AND INTRAOCULAR LENS PLACEMENT ([place]);  Surgeon: Birder Robson, MD;  Location: [facility];  Service: Ophthalmology;  Laterality: Right;  [place] 00:18.5 AP% 6.9 CDE 1.29 Fluid Pack Lot # [id number]   CATARACT EXTRACTION W/PHACO Left 10/02/2017   Procedure: CATARACT EXTRACTION PHACO AND INTRAOCULAR LENS PLACEMENT ([facility]);  Surgeon: Birder Robson, MD;  Location: [facility];  Service: Ophthalmology;  Laterality: Left;  [place] 00:25.9 AP% 6.9 CDE 1.80 Fluid Pack Lot # [id number]   CLOSED REDUCTION NASAL FRACTURE N/A 05/21/2018   Procedure: CLOSED REDUCTION NASAL FRACTURE;  Surgeon: Clyde Canterbury, MD;  Location: [place];  Service: ENT;  Laterality: N/A;  Latex sensitivity   COLONOSCOPY     EYE SURGERY     Lasik   FRACTURE SURGERY     KNEE SURGERY     left   LAPAROSCOPIC NISSEN FUNDOPLICATION N/A 75/88/3254   Procedure: LAPAROSCOPIC NISSEN TAKEDOWN AND UPPER ENDOSCOPY;  Surgeon: Johnathan Hausen, MD;  Location: [facility];  Service: General;  Laterality: N/A;   LEFT HEART CATH AND CORONARY ANGIOGRAPHY N/A 05/10/2020   Procedure: LEFT HEART CATH AND CORONARY ANGIOGRAPHY;  Surgeon: Belva Crome, MD;  Location: [place] [facility];  Service: Cardiovascular;  Laterality: N/A;   NASAL SEPTUM SURGERY     NISSEN FUNDOPLICATION     PARTIAL HYSTERECTOMY     POLYPECTOMY     repair cystocele and rectocele     thyroid ablation     TUBAL LIGATION     UPPER GASTROINTESTINAL ENDOSCOPY     UPPER GI ENDOSCOPY  01/12/2017   Procedure: UPPER GI ENDOSCOPY;  Surgeon:  Johnathan Hausen, MD;  Location: [facility];  Service: General;;   Social History   Social History Narrative   Not on file   Immunization History  Administered Date(s) Administered   Influenza Inj Mdck Quad Pf 05/08/2017   Influenza,inj,Quad PF,6+ Mos 03/08/2018, 03/29/2019, 06/25/2019, 03/31/2020, 04/14/2021   Influenza-Unspecified 03/31/2020   PFIZER Comirnaty(Gray Top)Covid-19 Tri-Sucrose Vaccine 09/20/2020   PFIZER(Purple Top)SARS-COV-2 Vaccination 09/28/2019, 10/20/2019, 03/02/2020   Zoster Recombinat (Shingrix) 09/06/2020     Objective: Vital Signs: There were no vitals taken for this visit.   Physical Exam   Musculoskeletal Exam: ***  CDAI Exam: CDAI Score: -- Patient Global: --; Provider Global: -- Swollen: --; Tender: -- Joint Exam 06/26/2022   No joint exam has  been documented for this visit   There is currently no information documented on the homunculus. Go to the Rheumatology activity and complete the homunculus joint exam.  Investigation: No additional findings.  Imaging: No results found.  Recent Labs: Lab Results  Component Value Date   WBC 5.7 03/16/2022   HGB 12.8 03/16/2022   PLT 287 03/16/2022   NA 136 03/16/2022   K 4.6 03/16/2022   CL 98 03/16/2022   CO2 26 03/16/2022   GLUCOSE 129 (H) 03/16/2022   BUN 9 03/16/2022   CREATININE 0.79 03/16/2022   BILITOT 0.5 03/16/2022   ALKPHOS 57 03/16/2022   AST 30 03/16/2022   ALT 25 03/16/2022   PROT 7.2 03/16/2022   ALBUMIN 4.2 03/16/2022   CALCIUM 9.3 03/16/2022   GFRAA 86 08/10/2020   QFTBGOLDPLUS Negative 10/06/2021    Speciality Comments: Inadequate response to Enbrel, Humira, Simponi,  Remicade-allergic response  Procedures:  No procedures performed Allergies: Demerol [meperidine], Imuran [azathioprine], Meperidine hcl, Remicade [infliximab], Sulfasalazine, Avelox [moxifloxacin], Doxycycline, Erythromycin, and Latex   Assessment / Plan:     Visit Diagnoses: No diagnosis  found.  Orders: No orders of the defined types were placed in this encounter.  No orders of the defined types were placed in this encounter.   Face-to-face time spent with patient was *** minutes. Greater than 50% of time was spent in counseling and coordination of care.  Follow-Up Instructions: No follow-ups on file.   Earnestine Mealing, CMA  Note - This record has been created using Editor, commissioning.  Chart creation errors have been sought, but may not always  have been located. Such creation errors do not reflect on  the standard of medical care.

## 2022-06-21 ENCOUNTER — Other Ambulatory Visit: Payer: Self-pay | Admitting: Gastroenterology

## 2022-06-26 ENCOUNTER — Ambulatory Visit: Payer: Medicare HMO | Admitting: Physician Assistant

## 2022-06-26 DIAGNOSIS — I7300 Raynaud's syndrome without gangrene: Secondary | ICD-10-CM

## 2022-06-26 DIAGNOSIS — Z79899 Other long term (current) drug therapy: Secondary | ICD-10-CM

## 2022-06-26 DIAGNOSIS — L409 Psoriasis, unspecified: Secondary | ICD-10-CM

## 2022-06-26 DIAGNOSIS — M5136 Other intervertebral disc degeneration, lumbar region: Secondary | ICD-10-CM

## 2022-06-26 DIAGNOSIS — Z87891 Personal history of nicotine dependence: Secondary | ICD-10-CM

## 2022-06-26 DIAGNOSIS — Z8679 Personal history of other diseases of the circulatory system: Secondary | ICD-10-CM

## 2022-06-26 DIAGNOSIS — Z9889 Other specified postprocedural states: Secondary | ICD-10-CM

## 2022-06-26 DIAGNOSIS — Z8719 Personal history of other diseases of the digestive system: Secondary | ICD-10-CM

## 2022-06-26 DIAGNOSIS — M7661 Achilles tendinitis, right leg: Secondary | ICD-10-CM

## 2022-06-26 DIAGNOSIS — L4050 Arthropathic psoriasis, unspecified: Secondary | ICD-10-CM

## 2022-06-26 DIAGNOSIS — E559 Vitamin D deficiency, unspecified: Secondary | ICD-10-CM

## 2022-06-26 DIAGNOSIS — M170 Bilateral primary osteoarthritis of knee: Secondary | ICD-10-CM

## 2022-07-05 ENCOUNTER — Ambulatory Visit: Payer: Medicare HMO | Admitting: Skilled Nursing Facility1

## 2022-07-13 NOTE — Telephone Encounter (Signed)
Received a fax from  [employer] regarding an approval for [facility] patient assistance from 07/17/2022 to 07/17/2023. Approval letter sent to scan center.  Phone# [phone]  Knox Saliva, PharmD, MPH, BCPS, CPP Clinical Pharmacist (Rheumatology and Pulmonology)

## 2022-07-13 NOTE — Progress Notes (Deleted)
Office Visit Note  Patient: [name]             Date of Birth: April 15, 1966           MRN: [id number]             PCP: Mayra Neer, MD Referring: Mayra Neer, MD Visit Date: 07/24/2022 Occupation: @GUAROCC @  Subjective:    History of Present Illness: [name] is a 56 y.o. female with history of psoriatic arthritis and DDD.  Patient remains on Xeljanz 5 mg 1 tablet by mouth twice daily, methotrexate 0.6 ml sq once weekly, folic acid 2 mg po daily.   FDA BLACK BOX WARNING for major adverse cardiovascular events (MACE), thrombosis, mortality (including sudden cardiovascular death), serious infections, and lymphomas. Patient is a former smoker.      TB gold negative on 10/06/21  Orders for CBC and CMP released today.  Discussed the importance of holding xeljanz and MTX if she develops signs or symptoms of an infection and to resume once the infection has completely cleared.   Activities of Daily Living:  Patient reports morning stiffness for *** {minute/hour:19697}.   Patient {ACTIONS;DENIES/REPORTS:21021675::"Denies"} nocturnal pain.  Difficulty dressing/grooming: {ACTIONS;DENIES/REPORTS:21021675::"Denies"} Difficulty climbing stairs: {ACTIONS;DENIES/REPORTS:21021675::"Denies"} Difficulty getting out of chair: {ACTIONS;DENIES/REPORTS:21021675::"Denies"} Difficulty using hands for taps, buttons, cutlery, and/or writing: {ACTIONS;DENIES/REPORTS:21021675::"Denies"}  No Rheumatology ROS completed.   PMFS History:  Patient Active Problem List   Diagnosis Date Noted   Coronary artery disease involving native coronary artery of native heart without angina pectoris 06/14/2020   Aortic atherosclerosis ([place]) 05/06/2020   Family history of bicuspid aortic valve 05/06/2020   Dyspnea on exertion 03/31/2020   Vitamin D deficiency 12/24/2019   Depression 12/24/2019   History of tobacco use 08/06/2019   Allergic rhinitis 04/19/2018   Obstructive sleep apnea syndrome, mild  04/19/2018   COPD (chronic obstructive pulmonary disease) ([place]) 03/26/2018   Rectal bleeding 01/22/2018   LLQ abdominal pain 01/22/2018   Dysphagia 01/12/2017   Spondylosis of lumbar region without myelopathy or radiculopathy 08/11/2016   Primary osteoarthritis of both knees 08/11/2016   History of hypertension 08/11/2016   History of ulcerative colitis 08/11/2016   History of IBS 08/11/2016   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   High risk medications (not anticoagulants) long-term use 05/13/2016   Knee pain, bilateral 05/13/2016   Anserine bursitis 05/13/2016   Chondromalacia patellae, left knee 05/13/2016   Chondromalacia patellae, right knee 05/13/2016   Psoriatic arthritis ([place]) 05/12/2016   UNSPECIFIED HYPOTHYROIDISM 08/24/2009   Other hyperlipidemia 08/24/2009   Essential hypertension 08/24/2009   GERD 08/24/2009   Ulcerative colitis ([place]) 08/24/2009   IRRITABLE BOWEL SYNDROME 08/24/2009   OVARIAN CYST 08/24/2009   PSORIASIS 08/24/2009   ARTHRITIS 08/24/2009   PANCREATITIS, HX OF 08/24/2009   FIBROCYSTIC BREAST DISEASE, HX OF 08/24/2009    Past Medical History:  Diagnosis Date   Allergy    Anxiety    Arthritis    Asthma    Back pain    Cataract    both eyes  surgically repaired   COPD (chronic obstructive pulmonary disease) ([place])    Depression    treated   Dyspnea    Fibrocystic breast disease    GERD (gastroesophageal reflux disease)    History of hiatal hernia    repaired   History of kidney stones    questionable   Hyperlipidemia    Hypertension    pt denies   Hypothyroidism    Irritable bowel syndrome    Joint  pain    Osteoarthritis    Ovarian cyst    Palpitations    Pancreatitis    PONV (postoperative nausea and vomiting)    NO TROUBLE WITH CATARACT SURGERY   Psoriasis    Psoriatic arthritis ([place]) 05/12/2016   Poor response to Humira Inadequate response to Enbrel Inadequate response to Simponi   Shortness of breath    Tachycardia    Ulcerative  colitis     Family History  Problem Relation Age of Onset   Breast cancer Mother 63   Heart disease Mother    Colon polyps Mother    Other Mother        pre-cancerous tumor removed   Colon cancer Mother    Hypertension Mother    Hyperlipidemia Mother    Cancer Mother    Depression Mother    Heart disease Father    Hypertension Father    Hyperlipidemia Father    Stroke Father    Alcohol abuse Father    Neuropathy Father    Crohn's disease Sister    Alcoholism Sister    Breast cancer Paternal Aunt    Breast cancer Maternal Grandmother    Colon polyps Paternal Grandmother    Crohn's disease Daughter    Irritable bowel syndrome Other    Esophageal cancer Neg Hx    Rectal cancer Neg Hx    Stomach cancer Neg Hx    Pancreatic cancer Neg Hx    Past Surgical History:  Procedure Laterality Date   ABDOMINAL HYSTERECTOMY     ANKLE RECONSTRUCTION     left   bladder tuck     CARPAL TUNNEL RELEASE     CATARACT EXTRACTION W/PHACO Right 09/11/2017   Procedure: CATARACT EXTRACTION PHACO AND INTRAOCULAR LENS PLACEMENT ([place]);  Surgeon: Birder Robson, MD;  Location: [facility];  Service: Ophthalmology;  Laterality: Right;  [place] 00:18.5 AP% 6.9 CDE 1.29 Fluid Pack Lot # [id number]   CATARACT EXTRACTION W/PHACO Left 10/02/2017   Procedure: CATARACT EXTRACTION PHACO AND INTRAOCULAR LENS PLACEMENT ([facility]);  Surgeon: Birder Robson, MD;  Location: [facility];  Service: Ophthalmology;  Laterality: Left;  [place] 00:25.9 AP% 6.9 CDE 1.80 Fluid Pack Lot # [id number]   CLOSED REDUCTION NASAL FRACTURE N/A 05/21/2018   Procedure: CLOSED REDUCTION NASAL FRACTURE;  Surgeon: Clyde Canterbury, MD;  Location: [facility];  Service: ENT;  Laterality: N/A;  Latex sensitivity   COLONOSCOPY     EYE SURGERY     Lasik   FRACTURE SURGERY     KNEE SURGERY     left   LAPAROSCOPIC NISSEN FUNDOPLICATION N/A 00/93/8182   Procedure: LAPAROSCOPIC NISSEN TAKEDOWN AND UPPER ENDOSCOPY;  Surgeon: Johnathan Hausen, MD;   Location: [facility];  Service: General;  Laterality: N/A;   LEFT HEART CATH AND CORONARY ANGIOGRAPHY N/A 05/10/2020   Procedure: LEFT HEART CATH AND CORONARY ANGIOGRAPHY;  Surgeon: Belva Crome, MD;  Location: [facility];  Service: Cardiovascular;  Laterality: N/A;   NASAL SEPTUM SURGERY     NISSEN FUNDOPLICATION     PARTIAL HYSTERECTOMY     POLYPECTOMY     repair cystocele and rectocele     thyroid ablation     TUBAL LIGATION     UPPER GASTROINTESTINAL ENDOSCOPY     UPPER GI ENDOSCOPY  01/12/2017   Procedure: UPPER GI ENDOSCOPY;  Surgeon: Johnathan Hausen, MD;  Location: [facility];  Service: General;;   Social History   Social History Narrative   Not on file   Immunization  History  Administered Date(s) Administered   Influenza Inj Mdck Quad Pf 05/08/2017   Influenza,inj,Quad PF,6+ Mos 03/08/2018, 03/29/2019, 06/25/2019, 03/31/2020, 04/14/2021   Influenza-Unspecified 03/31/2020   PFIZER Comirnaty(Gray Top)Covid-19 Tri-Sucrose Vaccine 09/20/2020   PFIZER(Purple Top)SARS-COV-2 Vaccination 09/28/2019, 10/20/2019, 03/02/2020   Zoster Recombinat (Shingrix) 09/06/2020     Objective: Vital Signs: There were no vitals taken for this visit.   Physical Exam Vitals and nursing note reviewed.  Constitutional:      Appearance: She is well-developed.  HENT:     Head: Normocephalic and atraumatic.  Eyes:     Conjunctiva/sclera: Conjunctivae normal.  Cardiovascular:     Rate and Rhythm: Normal rate and regular rhythm.     Heart sounds: Normal heart sounds.  Pulmonary:     Effort: Pulmonary effort is normal.     Breath sounds: Normal breath sounds.  Abdominal:     General: Bowel sounds are normal.     Palpations: Abdomen is soft.  Musculoskeletal:     Cervical back: Normal range of motion.  Skin:    General: Skin is warm and dry.     Capillary Refill: Capillary refill takes less than 2 seconds.  Neurological:     Mental Status: She is alert and oriented to person, place, and  time.  Psychiatric:        Behavior: Behavior normal.      Musculoskeletal Exam: ***  CDAI Exam: CDAI Score: -- Patient Global: --; Provider Global: -- Swollen: --; Tender: -- Joint Exam 07/24/2022   No joint exam has been documented for this visit   There is currently no information documented on the homunculus. Go to the Rheumatology activity and complete the homunculus joint exam.  Investigation: No additional findings.  Imaging: No results found.  Recent Labs: Lab Results  Component Value Date   WBC 5.7 03/16/2022   HGB 12.8 03/16/2022   PLT 287 03/16/2022   NA 136 03/16/2022   K 4.6 03/16/2022   CL 98 03/16/2022   CO2 26 03/16/2022   GLUCOSE 129 (H) 03/16/2022   BUN 9 03/16/2022   CREATININE 0.79 03/16/2022   BILITOT 0.5 03/16/2022   ALKPHOS 57 03/16/2022   AST 30 03/16/2022   ALT 25 03/16/2022   PROT 7.2 03/16/2022   ALBUMIN 4.2 03/16/2022   CALCIUM 9.3 03/16/2022   GFRAA 86 08/10/2020   QFTBGOLDPLUS Negative 10/06/2021    Speciality Comments: Inadequate response to Enbrel, Humira, Simponi,  Remicade-allergic response  Procedures:  No procedures performed Allergies: Demerol [meperidine], Imuran [azathioprine], Meperidine hcl, Remicade [infliximab], Sulfasalazine, Avelox [moxifloxacin], Doxycycline, Erythromycin, and Latex   Assessment / Plan:     Visit Diagnoses: Psoriatic arthritis ([place])  Psoriasis  High risk medication use - Xeljanz 5 mg 1 tablet by mouth twice daily, methotrexate 0.6 ml sq once weekly, folic acid 2 mg po daily.  Raynaud's phenomenon without gangrene  S/p bilateral carpal tunnel release - bilateral carpal tunnel release by Dr. Burney Gauze in May 2023  Primary osteoarthritis of both knees  Achilles tendonitis, bilateral  DDD (degenerative disc disease), lumbar  History of ulcerative colitis  History of gastroesophageal reflux (GERD)  History of hypertension  History of IBS  Vitamin D deficiency  Former  smoker  Orders: No orders of the defined types were placed in this encounter.  No orders of the defined types were placed in this encounter.   Face-to-face time spent with patient was *** minutes. Greater than 50% of time was spent in counseling and coordination of care.  Follow-Up Instructions: No follow-ups on file.   Ofilia Neas, PA-C  Note - This record has been created using Dragon software.  Chart creation errors have been sought, but may not always  have been located. Such creation errors do not reflect on  the standard of medical care.

## 2022-07-18 ENCOUNTER — Encounter: Payer: Self-pay | Admitting: Skilled Nursing Facility1

## 2022-07-18 ENCOUNTER — Encounter: Payer: Medicare HMO | Attending: Surgery | Admitting: Skilled Nursing Facility1

## 2022-07-18 VITALS — Ht 66.0 in | Wt 226.0 lb

## 2022-07-18 DIAGNOSIS — Z713 Dietary counseling and surveillance: Secondary | ICD-10-CM | POA: Insufficient documentation

## 2022-07-18 DIAGNOSIS — Z6836 Body mass index (BMI) 36.0-36.9, adult: Secondary | ICD-10-CM | POA: Diagnosis not present

## 2022-07-18 DIAGNOSIS — E669 Obesity, unspecified: Secondary | ICD-10-CM | POA: Insufficient documentation

## 2022-07-18 NOTE — Progress Notes (Signed)
Medical Nutrition Therapy   Referral stated Supervised Weight Loss (SWL) visits needed: 6 months  Planned surgery: Sleeve Gastrectomy    NUTRITION ASSESSMENT   Anthropometrics  Start weight at [facility]: 229.1 lbs (date: 05/22/2022)  Weight today: 226.0 lb. (07/18/2022) Height: 66 in BMI: 36.48 kg/m2     Clinical  Medical hx: COPD, GI, HTN, hypercholesterolemia, obesity, sleep apnea Medications: omeprazole, tofacitinib, cholecalciferol, escitalopram oxalate, estradiol, levocetirizine, levothyroxine, methotrexate, metoprolol succinate, montelukast, rosuvastatin  Labs: pt reports A1C 6.0 (05/16/2022) Notable signs/symptoms: none noted Any previous deficiencies? No  Micronutrient Nutrition Focused Physical Exam: Hair: No issues observed Eyes: No issues observed Mouth: No issues observed Neck: No issues observed Nails: No issues observed Skin: No issues observed  Lifestyle & Dietary Hx  (07/18/2022) Pt states she might eat more out of boredom than actual hunger.  Pt states she feels like she knows what to do when it comes to nutrition, it's just difficult to implement the changes.  Pt states she doesn't eat until later in the day and then ends up snacking a lot or eating too much at once.  Pt states she has started to switch to whole grains. Pt states her biggest issue right now that she wants to manage is dx of pre-diabetes. Pt states she suffers from Ulcerative Colitis, but has not had a flare up in awhile.  Pt states she has started walking at a near by park 3 times a week.  Pt states she often does not feel thirsty throughout the day and realizes she needs to drink more than she currently is (32 oz/day).  Physical Activity: ADLs, walking 3 times a week (2.5-3 miles)  Sleep Hygiene: duration and quality: good, at least 6 hours, 7-8 hours night  Current Patient Perceived Stress Level as stated by pt on a scale of 1-10:  2       Stress Management Techniques: remove self from  situation  Fruit servings per week: 0-2 Non starchy vegetable servings per week: 5-6 Whole Grains per week: 2-3  24-Hr Dietary Recall First Meal: 2 cups of coffee Snack: None reported. Second Meal: None reported.  Snack: granola bar  Third Meal: black eyed peas, mixed greens with bacon grease, steak and gravy, rice, yeast rolls  Snack: chocolate chip cookies  Beverages: diet Lipton green teas, coffee  Alcoholic beverages per week: 0   Estimated Energy Needs Calories: 1500  NUTRITION DIAGNOSIS  Overweight/obesity ([place]-3.3) related to past poor dietary habits and physical inactivity as evidenced by patient w/ planned sleeve surgery following dietary guidelines for continued weight loss.  NUTRITION INTERVENTION  Nutrition counseling (C-1) and education (E-2) to facilitate bariatric surgery goals.  Educated pt on micronutrient deficiencies post surgery and strategies to mitigate that risk   Pre-Op Goals Reviewed with the Patient Track food and beverage intake (pen and paper, MyFitness Pal, Baritastic app, etc.) Make healthy food choices while monitoring portion sizes Consume 3 meals per day or try to eat every 3-5 hours Avoid concentrated sugars and fried foods Keep sugar & fat in the single digits per serving on food labels Practice CHEWING your food (aim for applesauce consistency) Practice not drinking 15 minutes before, during, and 30 minutes after each meal and snack Avoid all carbonated beverages (ex: soda, sparkling beverages)  Limit caffeinated beverages (ex: coffee, tea, energy drinks) Avoid all sugar-sweetened beverages (ex: regular soda, sports drinks)  Avoid alcohol  Aim for 64-100 ounces of FLUID daily (with at least half of fluid intake being plain water)  Aim for at least 60-80 grams of PROTEIN daily Look for a liquid protein source that contains ?15 g protein and ?5 g carbohydrate (ex: shakes, drinks, shots) Make a list of non-food related activities Physical  activity is an important part of a healthy lifestyle so keep it moving! The goal is to reach 150 minutes of exercise per week, including cardiovascular and weight baring activity. Do mindful meals activity. Increasing water intake slowly to 64 oz a day. Adding in a small meal in the morning when possible.   *Goals that are bolded indicate the pt would like to start working towards these  Handouts Provided Include  Mindful Meals  Learning Style & Readiness for Change Teaching method utilized: Visual & Auditory  Demonstrated degree of understanding via: Teach Back  Readiness Level: preparation Barriers to learning/adherence to lifestyle change: nothing identified  RD's Notes for Next Visit Progress toward patients chosen goals     MONITORING & EVALUATION Dietary intake, weekly physical activity, body weight, and pre-op goals reached at next nutrition visit.    Next Steps  Patient is to follow up at [facility] in 1 month.

## 2022-07-24 ENCOUNTER — Ambulatory Visit: Payer: Medicare HMO | Admitting: Physician Assistant

## 2022-07-24 DIAGNOSIS — Z8679 Personal history of other diseases of the circulatory system: Secondary | ICD-10-CM

## 2022-07-24 DIAGNOSIS — M170 Bilateral primary osteoarthritis of knee: Secondary | ICD-10-CM

## 2022-07-24 DIAGNOSIS — L409 Psoriasis, unspecified: Secondary | ICD-10-CM

## 2022-07-24 DIAGNOSIS — Z79899 Other long term (current) drug therapy: Secondary | ICD-10-CM

## 2022-07-24 DIAGNOSIS — M5136 Other intervertebral disc degeneration, lumbar region: Secondary | ICD-10-CM

## 2022-07-24 DIAGNOSIS — Z87891 Personal history of nicotine dependence: Secondary | ICD-10-CM

## 2022-07-24 DIAGNOSIS — E559 Vitamin D deficiency, unspecified: Secondary | ICD-10-CM

## 2022-07-24 DIAGNOSIS — I7300 Raynaud's syndrome without gangrene: Secondary | ICD-10-CM

## 2022-07-24 DIAGNOSIS — Z9889 Other specified postprocedural states: Secondary | ICD-10-CM

## 2022-07-24 DIAGNOSIS — Z8719 Personal history of other diseases of the digestive system: Secondary | ICD-10-CM

## 2022-07-24 DIAGNOSIS — M7661 Achilles tendinitis, right leg: Secondary | ICD-10-CM

## 2022-07-24 DIAGNOSIS — L4050 Arthropathic psoriasis, unspecified: Secondary | ICD-10-CM

## 2022-07-26 DIAGNOSIS — J441 Chronic obstructive pulmonary disease with (acute) exacerbation: Secondary | ICD-10-CM | POA: Diagnosis not present

## 2022-07-26 DIAGNOSIS — J019 Acute sinusitis, unspecified: Secondary | ICD-10-CM | POA: Diagnosis not present

## 2022-07-26 DIAGNOSIS — Z20822 Contact with and (suspected) exposure to covid-19: Secondary | ICD-10-CM | POA: Diagnosis not present

## 2022-07-26 DIAGNOSIS — R059 Cough, unspecified: Secondary | ICD-10-CM | POA: Diagnosis not present

## 2022-07-31 NOTE — Progress Notes (Signed)
Office Visit Note  Patient: [name]             Date of Birth: September 06, 1965           MRN: [id number]             PCP: Mayra Neer, MD Referring: Mayra Neer, MD Visit Date: 08/14/2022 Occupation: '@GUAROCC'$ @  Subjective:  Medication monitoring   History of Present Illness: [name] is a 57 y.o. female with history of psoriatic arthritis and osteoarthritis.  Patient remains on Xeljanz 5 mg 1 tablet by mouth twice daily, methotrexate 6 tablets by mouth once weekly, folic acid 2 mg po daily.  Patient reports that she recently was diagnosed with the flu and bronchitis.  She states she was given a prednisone taper to help with her breathing which also helped with her joint pain and stiffness.  She states that she held both [place] and methotrexate for about 2 weeks and restarted both medications about 8 days ago.  Patient reports that she does have some psoriasis on the plantar aspect of both feet.  She uses clobetasol ointment topically as needed.  She states that at times when the plaques are significantly thickened there will be skin breakage which makes it very painful to bear weight.  Patient reports that during these times it is difficult to ambulate so she requested a renewed handicap placard.  She states she only uses a handicap placard when the psoriasis is flaring.  Patient reports that she has intermittent symptoms of Achilles tendinitis in the left foot.  She denies any plantar fasciitis.  She is not currently experiencing any joint swelling.  She does have some tenderness over the left SI joint at this time.     Activities of Daily Living:  Patient reports morning stiffness for 30-60 minutes  Patient Denies nocturnal pain.  Difficulty dressing/grooming: Denies Difficulty climbing stairs: Reports Difficulty getting out of chair: Denies Difficulty using hands for taps, buttons, cutlery, and/or writing: Reports  Review of Systems  Constitutional:  Negative for  fatigue.  HENT:  Negative for mouth sores and mouth dryness.   Eyes:  Negative for dryness.  Respiratory:  Negative for shortness of breath.   Cardiovascular:  Negative for chest pain and palpitations.  Gastrointestinal:  Negative for blood in stool, constipation and diarrhea.  Endocrine: Negative for increased urination.  Genitourinary:  Negative for involuntary urination.  Musculoskeletal:  Positive for joint pain, joint pain, joint swelling and morning stiffness. Negative for gait problem, myalgias, muscle weakness, muscle tenderness and myalgias.  Skin:  Positive for rash and hair loss. Negative for color change and sensitivity to sunlight.  Allergic/Immunologic: Positive for susceptible to infections.  Neurological:  Negative for dizziness and headaches.  Hematological:  Negative for swollen glands.  Psychiatric/Behavioral:  Negative for depressed mood and sleep disturbance. The patient is not nervous/anxious.     PMFS History:  Patient Active Problem List   Diagnosis Date Noted   Coronary artery disease involving native coronary artery of native heart without angina pectoris 06/14/2020   Aortic atherosclerosis ([name]) 05/06/2020   Family history of bicuspid aortic valve 05/06/2020   Dyspnea on exertion 03/31/2020   Vitamin D deficiency 12/24/2019   Depression 12/24/2019   History of tobacco use 08/06/2019   Allergic rhinitis 04/19/2018   Obstructive sleep apnea syndrome, mild 04/19/2018   COPD (chronic obstructive pulmonary disease) ([place]) 03/26/2018   Rectal bleeding 01/22/2018   LLQ abdominal pain 01/22/2018   Dysphagia 01/12/2017  Spondylosis of lumbar region without myelopathy or radiculopathy 08/11/2016   Primary osteoarthritis of both knees 08/11/2016   History of hypertension 08/11/2016   History of ulcerative colitis 08/11/2016   History of IBS 08/11/2016   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   High risk medications (not anticoagulants) long-term use 05/13/2016   Knee pain,  bilateral 05/13/2016   Anserine bursitis 05/13/2016   Chondromalacia patellae, left knee 05/13/2016   Chondromalacia patellae, right knee 05/13/2016   Psoriatic arthritis ([place]) 05/12/2016   UNSPECIFIED HYPOTHYROIDISM 08/24/2009   Other hyperlipidemia 08/24/2009   Essential hypertension 08/24/2009   GERD 08/24/2009   Ulcerative colitis ([place]) 08/24/2009   IRRITABLE BOWEL SYNDROME 08/24/2009   OVARIAN CYST 08/24/2009   PSORIASIS 08/24/2009   ARTHRITIS 08/24/2009   PANCREATITIS, HX OF 08/24/2009   FIBROCYSTIC BREAST DISEASE, HX OF 08/24/2009    Past Medical History:  Diagnosis Date   Allergy    Anxiety    Arthritis    Asthma    Back pain    Cataract    both eyes  surgically repaired   COPD (chronic obstructive pulmonary disease) ([place])    Depression    treated   Dyspnea    Fibrocystic breast disease    GERD (gastroesophageal reflux disease)    History of hiatal hernia    repaired   History of kidney stones    questionable   Hyperlipidemia    Hypertension    pt denies   Hypothyroidism    Irritable bowel syndrome    Joint pain    Osteoarthritis    Ovarian cyst    Palpitations    Pancreatitis    PONV (postoperative nausea and vomiting)    NO TROUBLE WITH CATARACT SURGERY   Psoriasis    Psoriatic arthritis ([place]) 05/12/2016   Poor response to Humira Inadequate response to Enbrel Inadequate response to Simponi   Shortness of breath    Tachycardia    Ulcerative colitis     Family History  Problem Relation Age of Onset   Breast cancer Mother 45   Heart disease Mother    Colon polyps Mother    Other Mother        pre-cancerous tumor removed   Colon cancer Mother    Hypertension Mother    Hyperlipidemia Mother    Cancer Mother    Depression Mother    Heart disease Father    Hypertension Father    Hyperlipidemia Father    Stroke Father    Alcohol abuse Father    Neuropathy Father    Crohn's disease Sister    Alcoholism Sister    Breast cancer Paternal Aunt     Breast cancer Maternal Grandmother    Colon polyps Paternal Grandmother    Crohn's disease Daughter    Irritable bowel syndrome Other    Esophageal cancer Neg Hx    Rectal cancer Neg Hx    Stomach cancer Neg Hx    Pancreatic cancer Neg Hx    Past Surgical History:  Procedure Laterality Date   ABDOMINAL HYSTERECTOMY     ANKLE RECONSTRUCTION     left   bladder tuck     CARPAL TUNNEL RELEASE     CATARACT EXTRACTION W/PHACO Right 09/11/2017   Procedure: CATARACT EXTRACTION PHACO AND INTRAOCULAR LENS PLACEMENT ([place]);  Surgeon: Birder Robson, MD;  Location: [facility];  Service: Ophthalmology;  Laterality: Right;  [place] 00:18.5 AP% 6.9 CDE 1.29 Fluid Pack Lot # [id number]   CATARACT EXTRACTION W/PHACO Left 10/02/2017  Procedure: CATARACT EXTRACTION PHACO AND INTRAOCULAR LENS PLACEMENT ([facility]);  Surgeon: Birder Robson, MD;  Location: [facility];  Service: Ophthalmology;  Laterality: Left;  [place] 00:25.9 AP% 6.9 CDE 1.80 Fluid Pack Lot # [id number]   CLOSED REDUCTION NASAL FRACTURE N/A 05/21/2018   Procedure: CLOSED REDUCTION NASAL FRACTURE;  Surgeon: Clyde Canterbury, MD;  Location: [facility];  Service: ENT;  Laterality: N/A;  Latex sensitivity   COLONOSCOPY     EYE SURGERY     Lasik   FRACTURE SURGERY     KNEE SURGERY     left   LAPAROSCOPIC NISSEN FUNDOPLICATION N/A 50/35/4656   Procedure: LAPAROSCOPIC NISSEN TAKEDOWN AND UPPER ENDOSCOPY;  Surgeon: Johnathan Hausen, MD;  Location: [facility];  Service: General;  Laterality: N/A;   LEFT HEART CATH AND CORONARY ANGIOGRAPHY N/A 05/10/2020   Procedure: LEFT HEART CATH AND CORONARY ANGIOGRAPHY;  Surgeon: Belva Crome, MD;  Location: [facility];  Service: Cardiovascular;  Laterality: N/A;   NASAL SEPTUM SURGERY     NISSEN FUNDOPLICATION     PARTIAL HYSTERECTOMY     POLYPECTOMY     repair cystocele and rectocele     thyroid ablation     TUBAL LIGATION     UPPER GASTROINTESTINAL ENDOSCOPY     UPPER GI ENDOSCOPY  01/12/2017    Procedure: UPPER GI ENDOSCOPY;  Surgeon: Johnathan Hausen, MD;  Location: [facility];  Service: General;;   Social History   Social History Narrative   Not on file   Immunization History  Administered Date(s) Administered   Influenza Inj Mdck Quad Pf 05/08/2017   Influenza,inj,Quad PF,6+ Mos 03/08/2018, 03/29/2019, 06/25/2019, 03/31/2020, 04/14/2021   Influenza-Unspecified 03/31/2020   PFIZER Comirnaty(Gray Top)Covid-19 Tri-Sucrose Vaccine 09/20/2020   PFIZER(Purple Top)SARS-COV-2 Vaccination 09/28/2019, 10/20/2019, 03/02/2020   Zoster Recombinat (Shingrix) 09/06/2020     Objective: Vital Signs: BP 129/80 (BP Location: Left Arm, Patient Position: Sitting, Cuff Size: Large)   Pulse 78   Resp 12   Ht '5\' 6"'$  (1.676 m)   Wt 228 lb (103.4 kg)   BMI 36.80 kg/m    Physical Exam Vitals and nursing note reviewed.  Constitutional:      Appearance: She is well-developed.  HENT:     Head: Normocephalic and atraumatic.  Eyes:     Conjunctiva/sclera: Conjunctivae normal.  Cardiovascular:     Rate and Rhythm: Normal rate and regular rhythm.     Heart sounds: Normal heart sounds.  Pulmonary:     Effort: Pulmonary effort is normal.     Breath sounds: Normal breath sounds.  Abdominal:     General: Bowel sounds are normal.     Palpations: Abdomen is soft.  Musculoskeletal:     Cervical back: Normal range of motion.  Skin:    General: Skin is warm and dry.     Capillary Refill: Capillary refill takes less than 2 seconds.  Neurological:     Mental Status: She is alert and oriented to person, place, and time.  Psychiatric:        Behavior: Behavior normal.      Musculoskeletal Exam: C-spine, thoracic spine, and lumbar spine good ROM.  No midline spinal tenderness.  Mild tenderness over the left SI joint.  Shoulder joints have slightly limited ROM especially with internal rotation.  Crepitus in both shoulders noted.  Elbow joints, wrist joints, MCPs, PIPs, and DIPs good ROM with no  synovitis.  PIP and DIP thickening.  Hip joints, knee joints, and ankle joints have good ROM  with no discomfort.  No warmth or effusion of knee joints. No tenderness or swelling of ankle joints.   CDAI Exam: CDAI Score: -- Patient Global: --; Provider Global: -- Swollen: --; Tender: -- Joint Exam 08/14/2022   No joint exam has been documented for this visit   There is currently no information documented on the homunculus. Go to the Rheumatology activity and complete the homunculus joint exam.  Investigation: No additional findings.  Imaging: No results found.  Recent Labs: Lab Results  Component Value Date   WBC 5.7 03/16/2022   HGB 12.8 03/16/2022   PLT 287 03/16/2022   NA 136 03/16/2022   K 4.6 03/16/2022   CL 98 03/16/2022   CO2 26 03/16/2022   GLUCOSE 129 (H) 03/16/2022   BUN 9 03/16/2022   CREATININE 0.79 03/16/2022   BILITOT 0.5 03/16/2022   ALKPHOS 57 03/16/2022   AST 30 03/16/2022   ALT 25 03/16/2022   PROT 7.2 03/16/2022   ALBUMIN 4.2 03/16/2022   CALCIUM 9.3 03/16/2022   GFRAA 86 08/10/2020   QFTBGOLDPLUS Negative 10/06/2021    Speciality Comments: Inadequate response to Enbrel, Humira, Simponi,  Remicade-allergic response  Procedures:  No procedures performed Allergies: Demerol [meperidine], Imuran [azathioprine], Meperidine hcl, Remicade [infliximab], Sulfasalazine, Avelox [moxifloxacin], Doxycycline, Erythromycin, and Latex   Assessment / Plan:     Visit Diagnoses: Psoriatic arthritis ([place]): She has no synovitis on examination today.  No evidence of Achilles tendinitis or plantar fasciitis.  She has some tenderness over the left SI joint.  Patient is currently taking Xeljanz 5 mg 1 tablet by mouth twice daily and methotrexate 6 tablets by mouth once weekly.  She recently had a gap in therapy for 2 weeks while recovering from the flu and bronchitis.  During that time she took a course of prednisone which helped with her breathing as well as to keep her  joint pain and stiffness at bay.  She is having a minor flare of psoriasis on the plantar aspect of both feet and has been using clobetasol ointment as needed.  She restarted Morrie Sheldon and methotrexate about 8 days ago.  She continues to tolerate combination therapy but would like to switch back from oral to injectable methotrexate if there is no longer shortage.  A refill of methotrexate 0.6 mL sq injections once weekly was sent to the pharmacy today.  She will remain on Xeljanz as prescribed.  She was advised to notify us if she develops signs or symptoms of a flare.  She will follow-up in the office in 5 months or sooner if needed.  Psoriasis - Plantar aspect of both feet-She uses clobetasol ointment as needed for symptomatic relief.  She is currently having a minor flare.  When she has severe flares she experiences painful cracking of the skin making it difficult to ambulate.  An updated form for new department handicap placard was provided to the patient to use when she is having flares of psoriasis on the plantar aspect of her feet.  High risk medication use - Xeljanz 5 mg 1 tablet by mouth twice daily, methotrexate 0.6 ml sq once weekly, folic acid 2 mg po daily.  Patient would like to switch back from oral methotrexate to injectable methotrexate.  A refill of methotrexate 0.6 mL subcu as injections was sent to the pharmacy today. TB gold negative on 10/06/21.  Future order for TB Gold placed today. CBC and CMP orders released today.  Her next lab work will be due at the  end of April and every 3 months to monitor for drug toxicity. Lipid panel updated on 05/16/22.   Discussed the importance of holding xeljanz and MTX if she develops signs or symptoms of an infection and to resume once the infection has completely cleared.   Plan: COMPLETE METABOLIC PANEL WITH GFR, CBC with Differential/Platelet, QuantiFERON-TB Gold Plus  Screening for tuberculosis -Future order for TB gold placed today.  Plan:  QuantiFERON-TB Gold Plus  Raynaud's phenomenon without gangrene: Not currently active.  She has intermittent symptoms of raynaud's phenomenon in her toes.  She typically tries to wear socks.    S/p bilateral carpal tunnel release - She had bilateral carpal tunnel release by Dr. Burney Gauze in May 2023.  Asymptomatic currently.   Primary osteoarthritis of both knees: Good ROM of both knee joints with no warmth or effusion.    Achilles tendonitis, bilateral: Not currently symptomatic.  She has occasional symptoms of Achilles tendinitis in the left ankle.   DDD (degenerative disc disease), lumbar: No midline spinal tenderness or symptoms of radiculopathy currently.   Other medical conditions are listed as follows:   History of ulcerative colitis: No recent flares.   History of gastroesophageal reflux (GERD)  History of IBS  History of hypertension: BP was 129/80 today in the office.   Vitamin D deficiency  Former smoker: Chest CT lung cancer screening performed 03/28/22-recommended yearly.     Orders: Orders Placed This Encounter  Procedures   COMPLETE METABOLIC PANEL WITH GFR   CBC with Differential/Platelet   QuantiFERON-TB Gold Plus   Meds ordered this encounter  Medications   methotrexate 50 MG/2ML injection    Sig: INJECT 0.6ML ('15MG'$ ) SUBCUTANEOUSLY ONE TIME WEEKLY. DISCARD VIAL 28 DAYS AFTER FIRST USE.    Dispense:  8 mL    Refill:  0      Follow-Up Instructions: Return in about 5 months (around 01/13/2023) for Psoriatic arthritis, Osteoarthritis.   Ofilia Neas, PA-C  Note - This record has been created using Dragon software.  Chart creation errors have been sought, but may not always  have been located. Such creation errors do not reflect on  the standard of medical care.

## 2022-08-14 ENCOUNTER — Encounter: Payer: Self-pay | Admitting: Physician Assistant

## 2022-08-14 ENCOUNTER — Ambulatory Visit: Payer: Medicare HMO | Attending: Physician Assistant | Admitting: Physician Assistant

## 2022-08-14 VITALS — BP 129/80 | HR 78 | Resp 12 | Ht 66.0 in | Wt 228.0 lb

## 2022-08-14 DIAGNOSIS — I7300 Raynaud's syndrome without gangrene: Secondary | ICD-10-CM | POA: Diagnosis not present

## 2022-08-14 DIAGNOSIS — M7661 Achilles tendinitis, right leg: Secondary | ICD-10-CM | POA: Diagnosis not present

## 2022-08-14 DIAGNOSIS — Z9889 Other specified postprocedural states: Secondary | ICD-10-CM | POA: Diagnosis not present

## 2022-08-14 DIAGNOSIS — Z87891 Personal history of nicotine dependence: Secondary | ICD-10-CM

## 2022-08-14 DIAGNOSIS — M5136 Other intervertebral disc degeneration, lumbar region: Secondary | ICD-10-CM | POA: Diagnosis not present

## 2022-08-14 DIAGNOSIS — Z79899 Other long term (current) drug therapy: Secondary | ICD-10-CM

## 2022-08-14 DIAGNOSIS — M7662 Achilles tendinitis, left leg: Secondary | ICD-10-CM

## 2022-08-14 DIAGNOSIS — Z8719 Personal history of other diseases of the digestive system: Secondary | ICD-10-CM | POA: Diagnosis not present

## 2022-08-14 DIAGNOSIS — Z111 Encounter for screening for respiratory tuberculosis: Secondary | ICD-10-CM

## 2022-08-14 DIAGNOSIS — M170 Bilateral primary osteoarthritis of knee: Secondary | ICD-10-CM | POA: Diagnosis not present

## 2022-08-14 DIAGNOSIS — L4050 Arthropathic psoriasis, unspecified: Secondary | ICD-10-CM

## 2022-08-14 DIAGNOSIS — Z8679 Personal history of other diseases of the circulatory system: Secondary | ICD-10-CM

## 2022-08-14 DIAGNOSIS — E559 Vitamin D deficiency, unspecified: Secondary | ICD-10-CM

## 2022-08-14 DIAGNOSIS — L409 Psoriasis, unspecified: Secondary | ICD-10-CM | POA: Diagnosis not present

## 2022-08-14 MED ORDER — METHOTREXATE SODIUM CHEMO INJECTION 50 MG/2ML: INTRAMUSCULAR | 0 refills | Status: DC

## 2022-08-14 NOTE — Progress Notes (Signed)
CBC WNL

## 2022-08-15 LAB — COMPLETE METABOLIC PANEL WITH GFR
AG Ratio: 1.3 (calc) (ref 1.0–2.5)
ALT: 26 U/L (ref 6–29)
AST: 25 U/L (ref 10–35)
Albumin: 3.9 g/dL (ref 3.6–5.1)
Alkaline phosphatase (APISO): 48 U/L (ref 37–153)
BUN: 12 mg/dL (ref 7–25)
CO2: 28 mmol/L (ref 20–32)
Calcium: 10 mg/dL (ref 8.6–10.4)
Chloride: 101 mmol/L (ref 98–110)
Creat: 0.73 mg/dL (ref 0.50–1.03)
Globulin: 3 g/dL (calc) (ref 1.9–3.7)
Glucose, Bld: 97 mg/dL (ref 65–99)
Potassium: 4.5 mmol/L (ref 3.5–5.3)
Sodium: 140 mmol/L (ref 135–146)
Total Bilirubin: 0.6 mg/dL (ref 0.2–1.2)
Total Protein: 6.9 g/dL (ref 6.1–8.1)
eGFR: 96 mL/min/{1.73_m2} (ref >=60)

## 2022-08-15 LAB — CBC WITH DIFFERENTIAL/PLATELET
Absolute Monocytes: 711 cells/uL (ref 200–950)
Basophils Absolute: 32 cells/uL (ref 0–200)
Basophils Relative: 0.4 %
Eosinophils Absolute: 63 cells/uL (ref 15–500)
Eosinophils Relative: 0.8 %
HCT: 38.5 % (ref 35.0–45.0)
Hemoglobin: 13.1 g/dL (ref 11.7–15.5)
Lymphs Abs: 1612 cells/uL (ref 850–3900)
MCH: 33 pg (ref 27.0–33.0)
MCHC: 34 g/dL (ref 32.0–36.0)
MCV: 97 fL (ref 80.0–100.0)
MPV: 11 fL (ref 7.5–12.5)
Monocytes Relative: 9 %
Neutro Abs: 5483 cells/uL (ref 1500–7800)
Neutrophils Relative %: 69.4 %
Platelets: 285 10*3/uL (ref 140–400)
RBC: 3.97 10*6/uL (ref 3.80–5.10)
RDW: 11.8 % (ref 11.0–15.0)
Total Lymphocyte: 20.4 %
WBC: 7.9 10*3/uL (ref 3.8–10.8)

## 2022-08-15 NOTE — Progress Notes (Signed)
CMP WNL

## 2022-08-21 ENCOUNTER — Encounter: Payer: Self-pay | Admitting: Skilled Nursing Facility1

## 2022-08-21 ENCOUNTER — Encounter: Payer: Medicare HMO | Attending: Surgery | Admitting: Skilled Nursing Facility1

## 2022-08-21 VITALS — Ht 66.0 in | Wt 228.0 lb

## 2022-08-21 DIAGNOSIS — I10 Essential (primary) hypertension: Secondary | ICD-10-CM | POA: Diagnosis not present

## 2022-08-21 DIAGNOSIS — Z79899 Other long term (current) drug therapy: Secondary | ICD-10-CM | POA: Insufficient documentation

## 2022-08-21 DIAGNOSIS — E669 Obesity, unspecified: Secondary | ICD-10-CM | POA: Insufficient documentation

## 2022-08-21 DIAGNOSIS — Z713 Dietary counseling and surveillance: Secondary | ICD-10-CM | POA: Insufficient documentation

## 2022-08-21 DIAGNOSIS — Z6836 Body mass index (BMI) 36.0-36.9, adult: Secondary | ICD-10-CM | POA: Insufficient documentation

## 2022-08-21 DIAGNOSIS — E7800 Pure hypercholesterolemia, unspecified: Secondary | ICD-10-CM | POA: Insufficient documentation

## 2022-08-21 DIAGNOSIS — R7303 Prediabetes: Secondary | ICD-10-CM | POA: Diagnosis not present

## 2022-08-21 NOTE — Progress Notes (Signed)
Medical Nutrition Therapy  Referral stated Supervised Weight Loss (SWL) visits needed: 6 months; attempting lifestyle changes due to not being approved for pharmacotherapy or surgery   Planned surgery: Sleeve Gastrectomy    NUTRITION ASSESSMENT   Anthropometrics  Start weight at [facility]: 229.1 lbs (date: 05/22/2022)  Weight today: 228  pounds  Height: 66 in BMI: 36.80 kg/m2     Clinical  Medical hx: COPD, GI, HTN, hypercholesterolemia, obesity, sleep apnea, Nissen Fundiplication Medications: omeprazole, tofacitinib, cholecalciferol, escitalopram oxalate, estradiol, levocetirizine, levothyroxine, methotrexate, metoprolol succinate, montelukast, rosuvastatin  Labs: pt reports A1C 6.0 (05/16/2022) Notable signs/symptoms: none noted Any previous deficiencies? No  Micronutrient Nutrition Focused Physical Exam: Hair: No issues observed Eyes: No issues observed Mouth: No issues observed Neck: No issues observed Nails: No issues observed Skin: No issues observed  Lifestyle & Dietary Hx   Pt states she sat with her husband and talked about what is good support for her for her diet. Pt states she learned from the mindful meals exercise she snacks too much at night and eats too much while eating.  Pt states she feels she is doing better with her water intake stating she feels she has added 2 extra cups of water.  Pt states she did switch whole wheat bread.   Physical Activity: walking 3 times a week (2.5-3 miles)  Sleep Hygiene: duration and quality: good, at least 6 hours, 7-8 hours night  Current Patient Perceived Stress Level as stated by pt on a scale of 1-10:  2       Stress Management Techniques: remove self from situation  Fruit servings per week: 0-2 Non starchy vegetable servings per week: 5-6 Whole Grains per week: 2-3  24-Hr Dietary Recall First Meal 8: 2 cups of coffee and almond milk + protein powder  Snack:  Second Meal: skipped Snack: granola bar  Third Meal:  post roast + carrots + onion + rice or potato Snack: chocolate chip cookies and other sweet snacks Beverages: diet Lipton green teas, black coffee, water  Alcoholic beverages per week: 0   Estimated Energy Needs Calories: 1500  NUTRITION DIAGNOSIS  Overweight/obesity ([place]-3.3) related to past poor dietary habits and physical inactivity as evidenced by patient w/ planned sleeve surgery following dietary guidelines for continued weight loss.  NUTRITION INTERVENTION  Nutrition counseling (C-1) and education (E-2) to facilitate bariatric surgery goals.  Educated pt on micronutrient deficiencies post surgery and strategies to mitigate that risk   Pre-Op Goals Reviewed with the Patient Track food and beverage intake (pen and paper, MyFitness Pal, Baritastic app, etc.) Make healthy food choices while monitoring portion sizes NEW: Consume 3 meals per day or try to eat every 3-5 hours Avoid concentrated sugars and fried foods Keep sugar & fat in the single digits per serving on food labels Practice CHEWING your food (aim for applesauce consistency) Practice not drinking 15 minutes before, during, and 30 minutes after each meal and snack Avoid all carbonated beverages (ex: soda, sparkling beverages)  Limit caffeinated beverages (ex: coffee, tea, energy drinks) Avoid all sugar-sweetened beverages (ex: regular soda, sports drinks)  Avoid alcohol  Aim for 64-100 ounces of FLUID daily (with at least half of fluid intake being plain water)  Aim for at least 60-80 grams of PROTEIN daily Look for a liquid protein source that contains ?15 g protein and ?5 g carbohydrate (ex: shakes, drinks, shots) Make a list of non-food related activities Continued: Physical activity is an important part of a healthy lifestyle so keep it  moving! The goal is to reach 150 minutes of exercise per week, including cardiovascular and weight baring activity. Continue: Do mindful meals activity. Continue: Increasing water  intake slowly to 64 oz a day. Continue: Adding in a small meal in the morning when possible.   *Goals that are bolded indicate the pt would like to start working towards these  Handouts Previously Provided Include  Mindful Meals  Learning Style & Readiness for Change Teaching method utilized: Visual & Auditory  Demonstrated degree of understanding via: Teach Back  Readiness Level: preparation Barriers to learning/adherence to lifestyle change: nothing identified  RD's Notes for Next Visit Progress toward patients chosen goals     MONITORING & EVALUATION Dietary intake, weekly physical activity, body weight, and pre-op goals reached at next nutrition visit.    Next Steps  Patient is to follow up at [facility] in 1 month.

## 2022-09-19 ENCOUNTER — Encounter: Payer: Medicare HMO | Attending: Surgery | Admitting: Skilled Nursing Facility1

## 2022-09-19 VITALS — Wt 223.6 lb

## 2022-09-19 DIAGNOSIS — E7800 Pure hypercholesterolemia, unspecified: Secondary | ICD-10-CM | POA: Insufficient documentation

## 2022-09-19 DIAGNOSIS — I10 Essential (primary) hypertension: Secondary | ICD-10-CM | POA: Insufficient documentation

## 2022-09-19 DIAGNOSIS — E669 Obesity, unspecified: Secondary | ICD-10-CM | POA: Insufficient documentation

## 2022-09-19 DIAGNOSIS — G4730 Sleep apnea, unspecified: Secondary | ICD-10-CM | POA: Diagnosis not present

## 2022-09-19 DIAGNOSIS — Z6836 Body mass index (BMI) 36.0-36.9, adult: Secondary | ICD-10-CM | POA: Insufficient documentation

## 2022-09-19 DIAGNOSIS — Z713 Dietary counseling and surveillance: Secondary | ICD-10-CM | POA: Diagnosis not present

## 2022-09-19 NOTE — Progress Notes (Signed)
Medical Nutrition Therapy  Referral stated Supervised Weight Loss (SWL) visits needed: 6 months; attempting lifestyle changes due to not being approved for pharmacotherapy or surgery   Planned surgery: Sleeve Gastrectomy    NUTRITION ASSESSMENT   Anthropometrics  Start weight at [facility]: 229.1 lbs (date: 05/22/2022)  Weight today: 223  pounds  Height: 66 in    Clinical  Medical hx: COPD, GI, HTN, hypercholesterolemia, obesity, sleep apnea, Nissen Fundiplication Medications: omeprazole, tofacitinib, cholecalciferol, escitalopram oxalate, estradiol, levocetirizine, levothyroxine, methotrexate, metoprolol succinate, montelukast, rosuvastatin  Labs: pt reports A1C 6.0 (05/16/2022) Notable signs/symptoms: none noted Any previous deficiencies? No  Micronutrient Nutrition Focused Physical Exam: Hair: No issues observed Eyes: No issues observed Mouth: No issues observed Neck: No issues observed Nails: No issues observed Skin: No issues observed  Lifestyle & Dietary Hx   Pt states her goals have been to focus on whole foods and reading the label looking for 5 grams or less of sugar.  Pt states since making her changes she has felt more energy, less irritable, and sleeping well.  Pt states she does read the label looking for lower mg of sodium. Pt states she has started eating breakfast, lunch, and dinner.   Body Composition Scale Date 09/19/2022  Current Body Weight 223.6  Total Body Fat % 42.6  Visceral Fat 14  Fat-Free Mass % 57.3   Total Body Water % 43.1  Muscle-Mass lbs 31.4  BMI 36  Body Fat Displacement          Torso  lbs 59.0         Left Leg  lbs 11.8         Right Leg  lbs 11.8         Left Arm  lbs 5.9         Right Arm  lbs 5.9     Physical Activity: walking 3 times a week (2.5-3 miles)  Sleep Hygiene: duration and quality: good, at least 6 hours, 7-8 hours night  Current Patient Perceived Stress Level as stated by pt on a scale of 1-10:  2       Stress  Management Techniques: remove self from situation  Fruit servings per week: 0-2 Non starchy vegetable servings per week: 5-6 Whole Grains per week: 2-3  24-Hr Dietary Recall First Meal 8: 2 cups of coffee [place] yogurt with strawberries and granola  and only protein powder when she skips breakfast. Snack: None Second Meal: Multi grain sandwich deli turkey/ salami , apple, chips nachos Snack: slide of cheese Third Meal: grill chicken, sauteed zucchini and onions, bell peppers  Snack: Microwave popcorn.  Beverages: diet Lipton green teas, black coffee, water, flavored water.   Alcoholic beverages per week: 0   Estimated Energy Needs Calories: 1500  NUTRITION DIAGNOSIS  Overweight/obesity ([place]-3.3) related to past poor dietary habits and physical inactivity as evidenced by patient w/ planned sleeve surgery following dietary guidelines for continued weight loss.  NUTRITION INTERVENTION  Nutrition counseling (C-1) and education (E-2) to facilitate bariatric surgery goals.  Educated pt on micronutrient deficiencies post surgery and strategies to mitigate that risk   Pre-Op Goals Reviewed with the Patient Track food and beverage intake (pen and paper, MyFitness Pal, Baritastic app, etc.) Make healthy food choices while monitoring portion sizes NEW: Consume 3 meals per day or try to eat every 3-5 hours Avoid concentrated sugars and fried foods Keep sugar & fat in the single digits per serving on food labels Practice CHEWING your food (aim for  applesauce consistency) Practice not drinking 15 minutes before, during, and 30 minutes after each meal and snack Avoid all carbonated beverages (ex: soda, sparkling beverages)  Limit caffeinated beverages (ex: coffee, tea, energy drinks) Avoid all sugar-sweetened beverages (ex: regular soda, sports drinks)  Avoid alcohol  Aim for 64-100 ounces of FLUID daily (with at least half of fluid intake being plain water)  Aim for at least 60-80 grams of  PROTEIN daily Look for a liquid protein source that contains ?15 g protein and ?5 g carbohydrate (ex: shakes, drinks, shots) Make a list of non-food related activities Continued: Physical activity is an important part of a healthy lifestyle so keep it moving! The goal is to reach 150 minutes of exercise per week, including cardiovascular and weight baring activity. Continue: Do mindful meals activity. Continue: Increasing water intake slowly to 64 oz a day. Continue: Adding in a small meal in the morning when possible.  NEW: consistently be active and doing yoga NEW: take the time to congratulate yourself on the awesome changes you have made  *Goals that are bolded indicate the pt would like to start working towards these  Handouts Previously Provided [place] for Change Teaching method utilized: Visual & Auditory  Demonstrated degree of understanding via: Teach Back  Readiness Level: Action Barriers to learning/adherence to lifestyle change: nothing identified  RD's Notes for Next Visit Progress toward patients chosen goals     MONITORING & EVALUATION Dietary intake, continue having breakfast and lunch. Weekly physical activity, increasing walking and yoga. body weight, and pre-op goals reached at next nutrition visit.    Next Steps  Patient is to follow up at [facility] in 1 month.

## 2022-09-26 ENCOUNTER — Ambulatory Visit
Admission: RE | Admit: 2022-09-26 | Discharge: 2022-09-26 | Disposition: A | Discharge status: Home / Self Care | Payer: Medicare HMO | Source: Ambulatory Visit | Attending: Family Medicine | Admitting: Family Medicine

## 2022-09-26 ENCOUNTER — Other Ambulatory Visit: Payer: Self-pay | Admitting: Family Medicine

## 2022-09-26 DIAGNOSIS — Z1231 Encounter for screening mammogram for malignant neoplasm of breast: Secondary | ICD-10-CM | POA: Diagnosis not present

## 2022-10-12 ENCOUNTER — Other Ambulatory Visit: Payer: Self-pay | Admitting: Physician Assistant

## 2022-10-16 ENCOUNTER — Other Ambulatory Visit: Payer: Self-pay

## 2022-10-16 ENCOUNTER — Encounter: Payer: Self-pay | Admitting: *Deleted

## 2022-10-16 DIAGNOSIS — Z111 Encounter for screening for respiratory tuberculosis: Secondary | ICD-10-CM | POA: Diagnosis not present

## 2022-10-16 DIAGNOSIS — Z79899 Other long term (current) drug therapy: Secondary | ICD-10-CM | POA: Diagnosis not present

## 2022-10-16 DIAGNOSIS — E7849 Other hyperlipidemia: Secondary | ICD-10-CM | POA: Diagnosis not present

## 2022-10-16 NOTE — Telephone Encounter (Signed)
Last Fill: 04/25/2022   Labs: 08/14/2022 CBC WNL CMP WNL   TB Gold: 10/06/2021 Neg    Next Visit: 12/18/2022  Last Visit: 08/14/2022  [id number] arthritis   Current Dose per office note 08/14/2022: [name] 5 mg 1 tablet by mouth twice daily   Message sent to patient to advise she is due to update her TB Gold  Okay to refill [name]?

## 2022-10-16 NOTE — Addendum Note (Signed)
Addended by: Carole Binning on: 10/16/2022 01:55 PM   Modules accepted: Orders

## 2022-10-16 NOTE — Telephone Encounter (Signed)
Sharyn Lull from [facility] on [address] requested a refill on the [employer]. The refill request is in Dr. Arlean Hopping basket to sign off.

## 2022-10-17 NOTE — Progress Notes (Signed)
CBC and CMP are normal.  Triglycerides are elevated.  LDL is normal.  Please forward results to her PCP.

## 2022-10-18 ENCOUNTER — Ambulatory Visit: Payer: Medicare HMO | Admitting: Skilled Nursing Facility1

## 2022-10-19 ENCOUNTER — Other Ambulatory Visit: Payer: Self-pay | Admitting: Physician Assistant

## 2022-10-19 NOTE — Telephone Encounter (Signed)
Last Fill: 08/14/2022  Labs: 10/16/2022 CBC and CMP are normal.   Next Visit: 12/18/2022  Last Visit: 08/14/2022  DX: Psoriatic arthritis   Current Dose per office note 08/14/2022: methotrexate 0.6 ml sq once weekly   Okay to refill Methotrexate?

## 2022-10-24 LAB — CBC WITH DIFFERENTIAL/PLATELET
Basophils Absolute: 0 10*3/uL (ref 0.0–0.2)
Basos: 0 %
EOS (ABSOLUTE): 0.1 10*3/uL (ref 0.0–0.4)
Eos: 1 %
Hematocrit: 40.1 % (ref 34.0–46.6)
Hemoglobin: 13.6 g/dL (ref 11.1–15.9)
Immature Grans (Abs): 0 10*3/uL (ref 0.0–0.1)
Immature Granulocytes: 0 %
Lymphocytes Absolute: 2 10*3/uL (ref 0.7–3.1)
Lymphs: 23 %
MCH: 31.9 pg (ref 26.6–33.0)
MCHC: 33.9 g/dL (ref 31.5–35.7)
MCV: 94 fL (ref 79–97)
Monocytes Absolute: 0.8 10*3/uL (ref 0.1–0.9)
Monocytes: 9 %
Neutrophils Absolute: 5.7 10*3/uL (ref 1.4–7.0)
Neutrophils: 67 %
Platelets: 264 10*3/uL (ref 150–450)
RBC: 4.27 x10E6/uL (ref 3.77–5.28)
RDW: 11.9 % (ref 11.7–15.4)
WBC: 8.5 10*3/uL (ref 3.4–10.8)

## 2022-10-24 LAB — CMP14+EGFR
ALT: 23 IU/L (ref 0–32)
AST: 25 IU/L (ref 0–40)
Albumin/Globulin Ratio: 1.4 (ref 1.2–2.2)
Albumin: 4.3 g/dL (ref 3.8–4.9)
Alkaline Phosphatase: 56 IU/L (ref 44–121)
BUN/Creatinine Ratio: 14 (ref 9–23)
BUN: 14 mg/dL (ref 6–24)
Bilirubin Total: 0.7 mg/dL (ref 0.0–1.2)
CO2: 23 mmol/L (ref 20–29)
Calcium: 9.5 mg/dL (ref 8.7–10.2)
Chloride: 100 mmol/L (ref 96–106)
Creatinine, Ser: 1 mg/dL (ref 0.57–1.00)
Globulin, Total: 3.1 g/dL (ref 1.5–4.5)
Glucose: 89 mg/dL (ref 70–99)
Potassium: 4.4 mmol/L (ref 3.5–5.2)
Sodium: 142 mmol/L (ref 134–144)
Total Protein: 7.4 g/dL (ref 6.0–8.5)
eGFR: 66 mL/min/{1.73_m2} (ref >59)

## 2022-10-24 LAB — LIPID PANEL
Chol/HDL Ratio: 2.4 ratio (ref 0.0–4.4)
Cholesterol, Total: 145 mg/dL (ref 100–199)
HDL: 61 mg/dL (ref >39)
LDL Chol Calc (NIH): 61 mg/dL (ref 0–99)
Triglycerides: 133 mg/dL (ref 0–149)
VLDL Cholesterol Cal: 23 mg/dL (ref 5–40)

## 2022-10-24 LAB — QUANTIFERON-TB GOLD PLUS
QuantiFERON Mitogen Value: 7.96 IU/mL
QuantiFERON Nil Value: 0.05 IU/mL
QuantiFERON TB1 Ag Value: 0.01 IU/mL
QuantiFERON TB2 Ag Value: 0.01 IU/mL
QuantiFERON-TB Gold Plus: NEGATIVE

## 2022-10-24 NOTE — Progress Notes (Signed)
TB gold negative

## 2022-10-25 ENCOUNTER — Encounter: Payer: Medicare HMO | Attending: Surgery | Admitting: Skilled Nursing Facility1

## 2022-10-25 ENCOUNTER — Encounter: Payer: Self-pay | Admitting: Skilled Nursing Facility1

## 2022-10-25 VITALS — Ht 66.0 in | Wt 222.8 lb

## 2022-10-25 DIAGNOSIS — Z713 Dietary counseling and surveillance: Secondary | ICD-10-CM | POA: Insufficient documentation

## 2022-10-25 DIAGNOSIS — Z6839 Body mass index (BMI) 39.0-39.9, adult: Secondary | ICD-10-CM | POA: Insufficient documentation

## 2022-10-25 DIAGNOSIS — E669 Obesity, unspecified: Secondary | ICD-10-CM | POA: Diagnosis not present

## 2022-10-25 NOTE — Progress Notes (Signed)
Medical Nutrition Therapy  Referral stated Supervised Weight Loss (SWL) visits needed: 6 months; attempting lifestyle changes due to not being approved for pharmacotherapy or surgery   Planned surgery: Sleeve Gastrectomy    NUTRITION ASSESSMENT   Anthropometrics  Start weight at [facility]: 229.1 lbs (date: 05/22/2022)  Weight today: 223  pounds  Height: 66 in    Clinical  Medical hx: COPD, GI, HTN, hypercholesterolemia, obesity, sleep apnea, Nissen Fundiplication Medications: omeprazole, tofacitinib, cholecalciferol, escitalopram oxalate, estradiol, levocetirizine, levothyroxine, methotrexate, metoprolol succinate, montelukast, rosuvastatin  Labs: pt reports A1C 6.0 (05/16/2022) Notable signs/symptoms: none noted Any previous deficiencies? No  Micronutrient Nutrition Focused Physical Exam: Hair: No issues observed Eyes: No issues observed Mouth: No issues observed Neck: No issues observed Nails: No issues observed Skin: No issues observed  Lifestyle & Dietary Hx  Pt states she has not been walking as much due to pollen.    Body Composition Scale Date 09/19/2022 10/25/2022  Current Body Weight 223.6 222.8  Total Body Fat % 42.6 42.5  Visceral Fat 14 14  Fat-Free Mass % 57.3 57.4   Total Body Water % 43.1 43.2  Muscle-Mass lbs 31.4 31.4  BMI 36 35.9  Body Fat Displacement           Torso  lbs 59.0 58.7         Left Leg  lbs 11.8 11.7         Right Leg  lbs 11.8 11.7         Left Arm  lbs 5.9 5.8         Right Arm  lbs 5.9 5.8    Physical Activity: ADL's  Sleep Hygiene: duration and quality: good, at least 6 hours, 7-8 hours night  Current Patient Perceived Stress Level as stated by pt on a scale of 1-10:  2       Stress Management Techniques: remove self from situation  Fruit servings per week: 0-2 Non starchy vegetable servings per week: 5-6 Whole Grains per week: 2-3  24-Hr Dietary Recall First Meal 8: granola bar or 1 sausage scrambled 2 eggs Snack:  None Second Meal: hotdog + cheerewine or sandwich + cheese and lettuce and mayo  Snack: carrot sticks Third Meal: grill chicken, green beans sweet potato + chili powder and olive oil and biscuit Snack: Microwave popcorn Beverages: diet Lipton green teas, black coffee, water, flavored water   Alcoholic beverages per week: 0   Estimated Energy Needs Calories: 1500  NUTRITION DIAGNOSIS  Overweight/obesity ([place]-3.3) related to past poor dietary habits and physical inactivity as evidenced by patient w/ planned sleeve surgery following dietary guidelines for continued weight loss.  NUTRITION INTERVENTION  Nutrition counseling (C-1) and education (E-2) to facilitate bariatric surgery goals.  Educated pt on micronutrient deficiencies post surgery and strategies to mitigate that risk   Pre-Op Goals Reviewed with the Patient Track food and beverage intake (pen and paper, MyFitness Pal, Baritastic app, etc.) Make healthy food choices while monitoring portion sizes NEW: Consume 3 meals per day or try to eat every 3-5 hours Avoid concentrated sugars and fried foods Keep sugar & fat in the single digits per serving on food labels Practice CHEWING your food (aim for applesauce consistency) Practice not drinking 15 minutes before, during, and 30 minutes after each meal and snack Avoid all carbonated beverages (ex: soda, sparkling beverages)  Limit caffeinated beverages (ex: coffee, tea, energy drinks) Avoid all sugar-sweetened beverages (ex: regular soda, sports drinks)  Avoid alcohol  Aim for 64-100 ounces  of FLUID daily (with at least half of fluid intake being plain water)  Aim for at least 60-80 grams of PROTEIN daily Look for a liquid protein source that contains ?15 g protein and ?5 g carbohydrate (ex: shakes, drinks, shots) Make a list of non-food related activities Continued: Physical activity is an important part of a healthy lifestyle so keep it moving! The goal is to reach 150 minutes  of exercise per week, including cardiovascular and weight baring activity. Continue: Do mindful meals activity. Continue: Increasing water intake slowly to 64 oz a day. Continue: Adding in a small meal in the morning when possible.  continue: consistently be active and doing yoga continue: take the time to congratulate yourself on the awesome changes you have made NEW: do a YouTube video if not going for a walk NEW: cut ALL of your portions in half and when still hungry go crotchey or go fora  walk or do a youtube video; except for non starchy vegetables  NEW: make a tik mark every time you do not cut your portions in half for accountability NEW: keep non starchy vegetables at 1+ cups   Handouts Previously Provided Include  Mindful Meals  Learning Style & [place] for Change Teaching method utilized: Visual & Auditory  Demonstrated degree of understanding via: Teach Back  Readiness Level: Action Barriers to learning/adherence to lifestyle change: nothing identified  RD's Notes for Next Visit Progress toward patients chosen goals     MONITORING & EVALUATION Dietary intake, continue having breakfast and lunch. Weekly physical activity, increasing walking and yoga. body weight, and pre-op goals reached at next nutrition visit.    Next Steps  Patient is to follow up at [facility] in 1 month.

## 2022-11-02 ENCOUNTER — Encounter: Payer: Self-pay | Admitting: Emergency Medicine

## 2022-11-02 ENCOUNTER — Ambulatory Visit: Payer: Medicare HMO | Admitting: Emergency Medicine

## 2022-11-02 VITALS — BP 132/78 | HR 75 | Temp 97.8°F | Ht 66.0 in | Wt 225.0 lb

## 2022-11-02 DIAGNOSIS — Z87891 Personal history of nicotine dependence: Secondary | ICD-10-CM | POA: Diagnosis not present

## 2022-11-02 DIAGNOSIS — J449 Chronic obstructive pulmonary disease, unspecified: Secondary | ICD-10-CM | POA: Diagnosis not present

## 2022-11-02 DIAGNOSIS — G478 Other sleep disorders: Secondary | ICD-10-CM | POA: Insufficient documentation

## 2022-11-02 MED ORDER — PANTOPRAZOLE SODIUM 40 MG PO TBEC: 40.0000 mg | DELAYED_RELEASE_TABLET | Freq: Two times a day (BID) | ORAL | 0 refills | Status: DC

## 2022-11-02 MED ORDER — HYDROCODONE BIT-HOMATROP MBR 5-1.5 MG/5ML PO SOLN: 5.0000 mL | Freq: Four times a day (QID) | ORAL | 0 refills | Status: DC | PRN

## 2022-11-02 MED ORDER — ALBUTEROL SULFATE (2.5 MG/3ML) 0.083% IN NEBU: 2.5000 mg | INHALATION_SOLUTION | Freq: Four times a day (QID) | RESPIRATORY_TRACT | 12 refills | Status: AC | PRN

## 2022-11-02 NOTE — Assessment & Plan Note (Signed)
Due for her repeat lung cancer screening CT scan September 2024

## 2022-11-02 NOTE — Assessment & Plan Note (Addendum)
With associated acute on chronic cough.  Continue Singulair, Xyzal, Flonase nasal spray as you have been taking them. Temporarily stop your omeprazole.  We will try starting pantoprazole 40 mg twice a day for 3 weeks.  Take this medication 1 hour around food.  Try to adhere to a GERD avoidance diet, avoid eating after 8 PM Try using Tessalon Perles 100 mg up to every 6 hours if needed for cough suppression Try using Hycodan 5 cc up to every 6 hours if needed for cough suppression

## 2022-11-02 NOTE — Assessment & Plan Note (Signed)
Appears to be fairly well compensated.  Her daily symptoms are stridor, cough.  No clear indication to get her back on scheduled BD therapy at this time.  Will check pulmonary function testing to quantify degree of obstruction, decide based on this.  Keep your albuterol available to use 2 puffs or 1 nebulizer treatment if you needed for shortness of breath, chest tightness, wheezing.  We will give your prescription for the nebulizer machine and nebulized medication. We will perform full pulmonary function testing in next office visit Follow Dr. Delton Coombes next available with PFT on the same day

## 2022-11-02 NOTE — Progress Notes (Signed)
Subjective:    Patient ID: [name], female    DOB: 1966-05-10, 57 y.o.   MRN: [id number]  HPI   ROV 11/02/22 --57 year old woman who presents today to reestablish care.  She has a history of former tobacco, COPD, mild OSA, obesity with some superimposed restrictive disease.  Also with CAD, psoriatic arthritis, ulcerative colitis.  She has been on methotrexate and [name] in the past.  She deals with chronic cough in the setting of all the above as well as allergic rhinitis and GERD. She has some baseline exertional SOB, uses albuterol about once a day. She had flu in 07/2022, treated w abx, prednisone. She has residual cough, wheeze, some voice instability. Cough usually non-productive. Throat noise and wheeze.  On singulair, xyzal, flonase NS. On omeprazole 20.   Lung cancer screening CT chest 03/28/2022 reviewed by me showed mild centrilobular emphysema, stable nodules with a 1.1 mm right middle lobe nodule, RADS 2 study   Review of Systems  Constitutional:  Negative for fever and unexpected weight change.  HENT:  Positive for postnasal drip. Negative for congestion, dental problem, ear pain, nosebleeds, rhinorrhea, sinus pressure, sneezing and trouble swallowing.   Eyes:  Negative for redness and itching.  Respiratory:  Positive for shortness of breath and wheezing. Negative for cough and chest tightness.   Cardiovascular:  Negative for palpitations and leg swelling.  Gastrointestinal:  Negative for nausea and vomiting.  Genitourinary:  Negative for dysuria.  Musculoskeletal:  Negative for joint swelling.  Skin:  Negative for rash.  Allergic/Immunologic: Negative.  Negative for environmental allergies, food allergies and immunocompromised state.  Neurological:  Negative for headaches.  Hematological:  Does not bruise/bleed easily.  Psychiatric/Behavioral:  Negative for dysphoric mood. The patient is not nervous/anxious.        Objective:   Physical Exam Vitals:   11/02/22  1326  BP: 132/78  Pulse: 75  Temp: 97.8 F (36.6 C)  TempSrc: Oral  SpO2: 99%  Weight: 225 lb (102.1 kg)  Height:  (1.676 m)   Gen: Pleasant, overwt woman, in no distress,  normal affect  ENT: No lesions,  mouth clear,  oropharynx clear, no postnasal drip  Neck: No JVD, no stridor  Lungs: No use of accessory muscles, no wheeze, decreased at bases  Cardiovascular: RRR, heart sounds normal, no murmur or gallops, no peripheral edema  Musculoskeletal: No deformities, no cyanosis or clubbing  Neuro: alert, non focal  Skin: Warm, no lesions or rash      Assessment & Plan:  COPD (chronic obstructive pulmonary disease) (HCC) Appears to be fairly well compensated.  Her daily symptoms are stridor, cough.  No clear indication to get her back on scheduled BD therapy at this time.  Will check pulmonary function testing to quantify degree of obstruction, decide based on this.  Keep your albuterol available to use 2 puffs or 1 nebulizer treatment if you needed for shortness of breath, chest tightness, wheezing.  We will give your prescription for the nebulizer machine and nebulized medication. We will perform full pulmonary function testing in next office visit Follow Dr. Delton Coombes next available with PFT on the same day  History of tobacco use Due for her repeat lung cancer screening CT scan September 2024  Upper airway resistance syndrome With associated acute on chronic cough.  Continue Singulair, Xyzal, Flonase nasal spray as you have been taking them. Temporarily stop your omeprazole.  We will try starting pantoprazole 40 mg twice a day for 3 weeks.  Take this medication 1 hour around food.  Try to adhere to a GERD avoidance diet, avoid eating after 8 PM Try using Tessalon Perles 100 mg up to every 6 hours if needed for cough suppression Try using Hycodan 5 cc up to every 6 hours if needed for cough suppression  Levy Pupa, MD, PhD 11/02/2022, 4:59 PM [facility] [phone] or if no answer [phone]

## 2022-11-02 NOTE — Patient Instructions (Addendum)
Keep your albuterol available to use 2 puffs or 1 nebulizer treatment if you needed for shortness of breath, chest tightness, wheezing.  We will give your prescription for the nebulizer machine and nebulized medication. Continue Singulair, Xyzal, Flonase nasal spray as you have been taking them. Temporarily stop your omeprazole.  We will try starting pantoprazole 40 mg twice a day for 3 weeks.  Take this medication 1 hour around food.  Try to adhere to a GERD avoidance diet, avoid eating after 8 PM Try using Tessalon Perles 100 mg up to every 6 hours if needed for cough suppression Try using Hycodan 5 cc up to every 6 hours if needed for cough suppression We will perform full pulmonary function testing in next office visit You will be due for your lung cancer screening CT scan of the chest in September 2024 Follow Dr. Delton Coombes next available with PFT on the same day

## 2022-11-09 ENCOUNTER — Other Ambulatory Visit: Payer: Self-pay | Admitting: Rheumatology

## 2022-11-09 NOTE — Telephone Encounter (Signed)
Last Fill: 10/16/2022 (30 day supply)  Labs: 10/16/2022 CBC and CMP are normal.  Triglycerides are elevated.  LDL is normal.   TB Gold: 07/22/1094 Neg    Next Visit: 12/18/2022  Last Visit: 08/14/2022  DX: Psoriatic arthritis   Current Dose per office note 08/14/2022: [name] 5 mg 1 tablet by mouth twice daily   Okay to refill [name]?

## 2022-11-15 DIAGNOSIS — K5190 Ulcerative colitis, unspecified, without complications: Secondary | ICD-10-CM | POA: Diagnosis not present

## 2022-11-15 DIAGNOSIS — R600 Localized edema: Secondary | ICD-10-CM | POA: Diagnosis not present

## 2022-11-15 DIAGNOSIS — J449 Chronic obstructive pulmonary disease, unspecified: Secondary | ICD-10-CM | POA: Diagnosis not present

## 2022-12-04 NOTE — Progress Notes (Unsigned)
12/07/2022 [name] [id number] Mar 23, 1966  Referring provider: Lupita Raider, MD Primary GI doctor: Dr. 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 difficile, GI stool pathogen to rule out infection Will get CT abdomen pelvis to rule out complications such as abscess Check CBC, c-Met, kidney Will get CRP and sed rate, fecal calprotectin Patient is up-to-date on vaccinations and had recent TB Gold recent Will do mesalamine suppositories, possible prednisone taper pending results Will discuss with Dr. Lavon Paganini and Dr. Titus Dubin, patient may need up titration of Xeljanz to 10 mg twice daily if there is no evidence of infection versus change of medication but uncertain what would be the next viable choice for treatment of both UC and PA, Rinvoq? Patient is due for colonoscopy August 2024, will do close follow-up  and schedule at that time  Fatty liver --Continue to work on risk factor modification including diet exercise and control of risk factors including blood sugars. - monitor q 6 months.   Gastroesophageal reflux disease, unspecified whether esophagitis present Lifestyle changes discussed, avoid NSAIDS, ETOH Continue on the same medication, reports symptoms are well controlled.    Other irritable bowel syndrome Refill dicyclomine for the patient, can take as needed.  Psoriatic arthritis (HCC) No flare at this time, denies rash or joint pain  Patient Care Team: Lupita Raider, MD as PCP - General (Family  Medicine) Christell Constant, MD as PCP - Cardiology (Cardiology)  HISTORY OF PRESENT ILLNESS: 58 y.o. female presents for evaluation of ulcerative pancolitis. Last seen in the office on 04/19/2021 by Dr. Lavon Paganini.   IBD history (prior medications titers why stopped and current meds with last dose): Has been on enbrel, Humira without help, imuran caused pancreatitis.  Diagnosed 2000 or before.  2011 colonoscopy showed pancolitis December 2014 colonoscopy showed chronic active colitis 0 to 30 cm normal transverse and right colon 10/2017 colonoscopy negative for active colitis 01/20/2018 CT AB and pelvis mild wall thickening sigmoid colon, no edema/inflammation.  02/23/2020 5 mm polyp transverse colon localized mildly nodular mucosa rectum nonbleeding internal hemorrhoids Due for recall: 02/2023  Other significant medical history: Psoriatic arthritis which she is on methotrexate and Xeljanz- follows Dr. Titus Dubin Chronic GERD status post [facility] fundoplication (EGD 2011 negative Barrett's) Fatty liver seen on imaging Vitamin D deficiency  Current History She states last 3 weeks she has had AB pain, rectal bleeding, and mucus.  She has had gnawing, aching AB pain periumblical with left lower quadrant AB pain with subsequent urgent BM with blood clots and mucus, would have tenesmus, no BM after feeling urgency.  Will have 4-5 Bm's a day, it is not diarrhea, just AB pain/blood and mucus.  She has fatigue, no chest pain SOB, dizziness.  She denies rashes or joint pain at this time.  She has not missed any doses of her medicaiton.  She denies fever, chills, weight loss.  She had ABX in [place] with flu/bronchitis, augmentin.  No one sick around her, no traveling.  She is hoping to travel in July, wanting to see Lavon Paganini and [place] and has been to all 50 states.  No NSAIDS.   Recent labs: TB GOLD 10/16/2022 Negative or TB skin if indeterminate.  2019 ESR 35, CRP  2.3 compared to 5  years ago/2018 at 5 10/16/2022 WBC 8.5 HGB 13.6 MCV 94 Platelets 264 B12 378 FOLATE 17.2 10/16/2022 AST 25 ALT 23 Alkphos 56 TBili 0.7 09/16/2020 VITAMIN D 33.2  IBD Health Care Maintenance: Annual Flu Vaccine - UTD Pneumococcal Vaccine if receiving immunosuppression: -   TB testing if on anti-TNF, yearly - completed 10/2022 Vitamin D screening - 2022, she is on 2000 IU COVID vaccine UTD Shingrix UTD  Immunization History  Administered Date(s) Administered   Influenza Inj Mdck Quad Pf 05/08/2017   Influenza,inj,Quad PF,6+ Mos 03/08/2018, 03/29/2019, 06/25/2019, 03/31/2020, 04/14/2021   Influenza-Unspecified 03/31/2020   PFIZER Comirnaty(Gray Top)Covid-19 Tri-Sucrose Vaccine 09/20/2020   PFIZER(Purple Top)SARS-COV-2 Vaccination 09/28/2019, 10/20/2019, 03/02/2020   Zoster Recombinat (Shingrix) 09/06/2020, 02/28/2021     She  reports that she quit smoking about 13 years ago. Her smoking use included cigarettes. She has a 37.50 pack-year smoking history. She has never been exposed to tobacco smoke. She has never used smokeless tobacco. She reports current alcohol use. She reports that she does not use drugs.  RELEVANT LABS AND IMAGING: CBC    Component Value Date/Time   WBC 8.5 10/16/2022 1513   WBC 7.9 08/14/2022 0906   RBC 4.27 10/16/2022 1513   RBC 3.97 08/14/2022 0906   HGB 13.6 10/16/2022 1513   HCT 40.1 10/16/2022 1513   PLT 264 10/16/2022 1513   MCV 94 10/16/2022 1513   MCH 31.9 10/16/2022 1513   MCH 33.0 08/14/2022 0906   MCHC 33.9 10/16/2022 1513   MCHC 34.0 08/14/2022 0906   RDW 11.9 10/16/2022 1513   LYMPHSABS 2.0 10/16/2022 1513   MONOABS 0.4 03/01/2017 1039   EOSABS 0.1 10/16/2022 1513   BASOSABS 0.0 10/16/2022 1513   Recent Labs    12/20/21 0953 03/16/22 1139 08/14/22 0906 10/16/22 1513  HGB 13.7 12.8 13.1 13.6    CMP     Component Value Date/Time   NA 142 10/16/2022 1513   K 4.4 10/16/2022 1513   CL 100 10/16/2022 1513   CO2 23 10/16/2022 1513    GLUCOSE 89 10/16/2022 1513   GLUCOSE 97 08/14/2022 0906   BUN 14 10/16/2022 1513   CREATININE 1.00 10/16/2022 1513   CREATININE 0.73 08/14/2022 0906   CALCIUM 9.5 10/16/2022 1513   PROT 7.4 10/16/2022 1513   ALBUMIN 4.3 10/16/2022 1513   AST 25 10/16/2022 1513   ALT 23 10/16/2022 1513   ALKPHOS 56 10/16/2022 1513   BILITOT 0.7 10/16/2022 1513   GFRNONAA 75 08/10/2020 1303   GFRNONAA 91 02/03/2019 1125   GFRAA 86 08/10/2020 1303   GFRAA 105 02/03/2019 1125      Latest Ref Rng & Units 10/16/2022    3:13 PM 08/14/2022    9:06 AM 03/16/2022   11:39 AM  Hepatic Function  Total Protein 6.0 - 8.5 g/dL 7.4  6.9  7.2   Albumin 3.8 - 4.9 g/dL 4.3   4.2   AST 0 - 40 IU/L 25  25  30    ALT 0 - 32 IU/L 23  26  25    Alk Phosphatase 44 - 121 IU/L 56   57   Total Bilirubin 0.0 - 1.2 mg/dL 0.7  0.6  0.5       Current Medications:   Current Outpatient Medications (Endocrine & Metabolic):    estradiol (ESTRACE) 1 MG tablet, Take 1 mg  by mouth daily.   levothyroxine (SYNTHROID) 125 MCG tablet, Take 125 mcg by mouth every other day. Alternate with 137 mcg   levothyroxine (SYNTHROID) 137 MCG tablet, Take 137 mcg by mouth every other day. Alternate with 125 mcg  Current Outpatient Medications (Cardiovascular):    metoprolol succinate (TOPROL-XL) 50 MG 24 hr tablet, Take 25 mg by mouth daily. Take with or immediately following a meal.   rosuvastatin (CRESTOR) 20 MG tablet, Take 20 mg by mouth daily.   nitroGLYCERIN (NITROSTAT) 0.4 MG SL tablet, Place 1 tablet (0.4 mg total) under the tongue every 5 (five) minutes as needed for chest pain.  Current Outpatient Medications (Respiratory):    albuterol (PROVENTIL) (2.5 MG/3ML) 0.083% nebulizer solution, Take 3 mLs (2.5 mg total) by nebulization every 6 (six) hours as needed for wheezing or shortness of breath.   albuterol (VENTOLIN HFA) 108 (90 Base) MCG/ACT inhaler, Inhale 2 puffs into the lungs every 6 (six) hours as needed for wheezing or shortness of  breath.   fluticasone (FLONASE) 50 MCG/ACT nasal spray, Place 1 spray into both nostrils daily.   HYDROcodone bit-homatropine (HYCODAN) 5-1.5 MG/5ML syrup, Take 5 mLs by mouth every 6 (six) hours as needed for cough.   levocetirizine (XYZAL) 5 MG tablet, Take 5 mg by mouth every evening.   montelukast (SINGULAIR) 10 MG tablet, Take 10 mg by mouth at bedtime.  Current Outpatient Medications (Analgesics):    aspirin-acetaminophen-caffeine (EXCEDRIN MIGRAINE) 250-250-65 MG tablet, Take 2 tablets by mouth every 8 (eight) hours as needed for headache.    ibuprofen (ADVIL,MOTRIN) 200 MG tablet, Take 400 mg by mouth every 6 (six) hours as needed for mild pain.    XELJANZ 5 MG TABS, Take 1 tablet by mouth twice a day  Current Outpatient Medications (Hematological):    folic acid (FOLVITE) 1 MG tablet, Take 2 mg by mouth daily.  Current Outpatient Medications (Other):    Cholecalciferol (VITAMIN D3) 50 MCG (2000 UT) CAPS, Take 2,000 Units by mouth.   clobetasol ointment (TEMOVATE) 0.05 %, Apply 1 application topically 2 (two) times daily.   mesalamine (CANASA) 1000 MG suppository, Place 1 suppository (1,000 mg total) rectally at bedtime.   methotrexate 2.5 MG tablet, TAKE 6 TABLETS (15 MG TOTAL) ONCE A WEEK. CAUTION: CHEMOTHERAPY. PROTECT FROM LIGHT.   methotrexate 50 MG/2ML injection, INJECT 0.6ML (15MG ) UNDER THE SKIN ONE TIME WEEKLY. DISCARD VIAL 28 DAYS AFTER FIRST USE.   omeprazole (PRILOSEC) 20 MG capsule, TAKE 1 CAPSULE EVERY DAY BEFORE BREAKFAST   pantoprazole (PROTONIX) 40 MG tablet, Take 1 tablet (40 mg total) by mouth 2 (two) times daily.   Tuberculin-Allergy Syringes 27G X 1/2" 1 ML KIT, Inject 1 Syringe into the skin once a week. To be used with weekly methotrexate injections   dicyclomine (BENTYL) 20 MG tablet, Take 1 tablet (20 mg total) by mouth as needed.   escitalopram (LEXAPRO) 20 MG tablet, Take 1 tablet (20 mg total) by mouth daily.   gabapentin (NEURONTIN) 300 MG capsule, Take 1  capsule (300 mg total) by mouth 3 (three) times daily.   phentermine (ADIPEX-P) 37.5 MG tablet, Take 37.5 mg by mouth every morning.  Medical History:  Past Medical History:  Diagnosis Date   Allergy    Anxiety    Arthritis    Asthma    Back pain    Cataract    both eyes  surgically repaired   COPD (chronic obstructive pulmonary disease) (HCC)    Depression    treated  Dyspnea    Fibrocystic breast disease    GERD (gastroesophageal reflux disease)    History of hiatal hernia    repaired   History of kidney stones    questionable   Hyperlipidemia    Hypertension    pt denies   Hypothyroidism    Irritable bowel syndrome    Joint pain    Osteoarthritis    Ovarian cyst    Palpitations    Pancreatitis    PONV (postoperative nausea and vomiting)    NO TROUBLE WITH CATARACT SURGERY   Psoriasis    Psoriatic arthritis (HCC) 05/12/2016   Poor response to Humira Inadequate response to Enbrel Inadequate response to Simponi   Shortness of breath    Tachycardia    Ulcerative colitis    Allergies:  Allergies  Allergen Reactions   Demerol [Meperidine] Hives   Imuran [Azathioprine] Other (See Comments)    Pancreatitis   Meperidine Hcl Hives   Remicade [Infliximab] Hives and Other (See Comments)    Redness   Sulfasalazine Swelling and Hives   Avelox [Moxifloxacin] Rash   Doxycycline Rash   Erythromycin Rash   Latex Rash    Gloves and elastic in underwear     Surgical History:  She  has a past surgical history that includes Tubal ligation; repair cystocele and rectocele; Nissen fundoplication; thyroid ablation; Ankle reconstruction; bladder tuck; Knee surgery; Nasal septum surgery; Colonoscopy; Partial hysterectomy; Eye surgery; Laparoscopic Nissen fundoplication (N/A, 01/12/2017); Upper gi endoscopy (01/12/2017); Abdominal hysterectomy; Cataract extraction w/PHACO (Right, 09/11/2017); Fracture surgery; Cataract extraction w/PHACO (Left, 10/02/2017); Polypectomy; Upper  gastrointestinal endoscopy; Closed reduction nasal fracture (N/A, 05/21/2018); LEFT HEART CATH AND CORONARY ANGIOGRAPHY (N/A, 05/10/2020); and Carpal tunnel release. Family History:  Her family history includes Alcohol abuse in her father; Alcoholism in her sister; Breast cancer in her maternal grandmother and paternal aunt; Breast cancer (age of onset: 31) in her mother; Cancer in her mother; Colon cancer in her mother; Colon polyps in her mother and paternal grandmother; Crohn's disease in her daughter and sister; Depression in her mother; Heart disease in her father and mother; Hyperlipidemia in her father and mother; Hypertension in her father and mother; Irritable bowel syndrome in an other family member; Neuropathy in her father; Other in her mother; Stroke in her father.  REVIEW OF SYSTEMS  : All other systems reviewed and negative except where noted in the History of Present Illness.  PHYSICAL EXAM: BP 118/72   Pulse 82   Ht 5\' 6"  (1.676 m)   Wt 223 lb (101.2 kg)   BMI 35.99 kg/m  General Appearance: Well nourished, in no apparent distress. Head:   Normocephalic and atraumatic. Eyes:  sclerae anicteric,conjunctive pink  Respiratory: Respiratory effort normal, BS equal bilaterally without rales, rhonchi, wheezing. Cardio: RRR with no MRGs. Peripheral pulses intact.  Abdomen: Soft,  Obese ,active bowel sounds.  Mild to moderate tenderness in the LLQ. With guarding and Without rebound. No masses. Rectal: External rectum with hemorrhoidal skin tags, internal hemorrhoids, posterior rectum some erythema, internal exam with tenderness, no masses, hard stool palpated, positive Hemoccult. Musculoskeletal: Full ROM, Normal gait. Without edema. Skin:  Dry and intact without significant lesions or rashes Neuro: Alert and  oriented x4;  No focal deficits. Psych:  Cooperative. Normal mood and affect.    Doree Albee, PA-C 9:37 AM

## 2022-12-04 NOTE — Progress Notes (Unsigned)
Office Visit Note  Patient: [name]             Date of Birth: 16-Dec-1965           MRN: [id number]             PCP: Lupita Raider, MD Referring: Lupita Raider, MD Visit Date: 12/18/2022 Occupation: @GUAROCC @  Subjective:  UC flare   History of Present Illness: [name] is a 57 y.o. female with history of psoriatic arthritis.  Patient remains on Xeljanz 5 mg 1 tablet by mouth twice daily, methotrexate 0.6 ml sq once weekly, folic acid 2 mg po daily.  She has been tolerating combination therapy without any side effects and has not missed any doses recently.  Patient reports for the past 3 weeks she has been experiencing symptoms of an ulcerative colitis flare.  She has been having several bouts of diarrhea as well as blood in her stool.  She is scheduled for an abdominal CT tomorrow for further evaluation.  There has been discussion of a steroid taper as well as increasing the dose of Xeljanz to 10 mg twice daily pending CT results.  She denies any identifiable triggers for her current flare.  Patient states she continues to have some arthralgias and joint stiffness.  She denies any nocturnal pain.  She has been having some increased discomfort in the left Achilles tendon as well as ongoing discomfort in both SI joints especially the left SI joint.  She has noted some increased fluid retention and states that she will be having updated echocardiogram in August 2024. She denies any recent or recurrent infections.  She denies missing any doses of Xeljanz or methotrexate recently.     Activities of Daily Living:  Patient reports morning stiffness for 20-30 minutes.   Patient Denies nocturnal pain.  Difficulty dressing/grooming: Denies Difficulty climbing stairs: Reports Difficulty getting out of chair: Denies Difficulty using hands for taps, buttons, cutlery, and/or writing: Reports  Review of Systems  Constitutional:  Positive for fatigue.  HENT:  Negative for mouth  sores and mouth dryness.   Eyes:  Negative for dryness.  Respiratory:  Positive for shortness of breath and wheezing.   Cardiovascular:  Positive for swelling in legs/feet. Negative for chest pain and palpitations.  Gastrointestinal:  Positive for abdominal pain, blood in stool, diarrhea and nausea. Negative for constipation.  Endocrine: Negative for increased urination.  Genitourinary:  Negative for involuntary urination.  Musculoskeletal:  Positive for joint pain, joint pain and morning stiffness. Negative for gait problem, joint swelling, myalgias, muscle weakness, muscle tenderness and myalgias.  Skin:  Positive for rash. Negative for color change, hair loss and sensitivity to sunlight.  Allergic/Immunologic: Negative for susceptible to infections.  Neurological:  Negative for dizziness and headaches.  Hematological:  Negative for swollen glands.  Psychiatric/Behavioral:  Negative for depressed mood and sleep disturbance. The patient is not nervous/anxious.     PMFS History:  Patient Active Problem List   Diagnosis Date Noted   Upper airway resistance syndrome 11/02/2022   Coronary artery disease involving native coronary artery of native heart without angina pectoris 06/14/2020   Aortic atherosclerosis (HCC) 05/06/2020   Family history of bicuspid aortic valve 05/06/2020   Dyspnea on exertion 03/31/2020   Vitamin D deficiency 12/24/2019   Depression 12/24/2019   History of tobacco use 08/06/2019   Allergic rhinitis 04/19/2018   Obstructive sleep apnea syndrome, mild 04/19/2018   COPD (chronic obstructive pulmonary disease) (HCC) 03/26/2018  Rectal bleeding 01/22/2018   LLQ abdominal pain 01/22/2018   Dysphagia 01/12/2017   Spondylosis of lumbar region without myelopathy or radiculopathy 08/11/2016   Primary osteoarthritis of both knees 08/11/2016   History of hypertension 08/11/2016   History of ulcerative colitis 08/11/2016   History of IBS 08/11/2016   Positive TB test  08/11/2016   High risk medications (not anticoagulants) long-term use 05/13/2016   Knee pain, bilateral 05/13/2016   Anserine bursitis 05/13/2016   Chondromalacia patellae, left knee 05/13/2016   Chondromalacia patellae, right knee 05/13/2016   Psoriatic arthritis (HCC) 05/12/2016   UNSPECIFIED HYPOTHYROIDISM 08/24/2009   Other hyperlipidemia 08/24/2009   Essential hypertension 08/24/2009   GERD 08/24/2009   Ulcerative colitis (HCC) 08/24/2009   IRRITABLE BOWEL SYNDROME 08/24/2009   OVARIAN CYST 08/24/2009   PSORIASIS 08/24/2009   ARTHRITIS 08/24/2009   PANCREATITIS, HX OF 08/24/2009   FIBROCYSTIC BREAST DISEASE, HX OF 08/24/2009    Past Medical History:  Diagnosis Date   Allergy    Anxiety    Arthritis    Asthma    Back pain    Cataract    both eyes  surgically repaired   COPD (chronic obstructive pulmonary disease) (HCC)    Depression    treated   Dyspnea    Fibrocystic breast disease    GERD (gastroesophageal reflux disease)    History of hiatal hernia    repaired   History of kidney stones    questionable   Hyperlipidemia    Hypertension    pt denies   Hypothyroidism    Irritable bowel syndrome    Joint pain    Osteoarthritis    Ovarian cyst    Palpitations    Pancreatitis    PONV (postoperative nausea and vomiting)    NO TROUBLE WITH CATARACT SURGERY   Psoriasis    Psoriatic arthritis (HCC) 05/12/2016   Poor response to Humira Inadequate response to Enbrel Inadequate response to Simponi   Shortness of breath    Tachycardia    Ulcerative colitis     Family History  Problem Relation Age of Onset   Breast cancer Mother 44   Heart disease Mother    Colon polyps Mother    Other Mother        pre-cancerous tumor removed   Colon cancer Mother    Hypertension Mother    Hyperlipidemia Mother    Cancer Mother    Depression Mother    Heart disease Father    Hypertension Father    Hyperlipidemia Father    Stroke Father    Alcohol abuse Father     Neuropathy Father    Crohn's disease Sister    Alcoholism Sister    Breast cancer Paternal Aunt    Breast cancer Maternal Grandmother    Colon polyps Paternal Grandmother    Crohn's disease Daughter    Irritable bowel syndrome Other    Esophageal cancer Neg Hx    Rectal cancer Neg Hx    Stomach cancer Neg Hx    Pancreatic cancer Neg Hx    Past Surgical History:  Procedure Laterality Date   ABDOMINAL HYSTERECTOMY     ANKLE RECONSTRUCTION     left   bladder tuck     CARPAL TUNNEL RELEASE     CATARACT EXTRACTION W/PHACO Right 09/11/2017   Procedure: CATARACT EXTRACTION PHACO AND INTRAOCULAR LENS PLACEMENT (IOC);  Surgeon: Galen Manila, MD;  Location: [facility];  Service: Ophthalmology;  Laterality: Right;  [place] 00:18.5 AP% 6.9 CDE  1.29 Fluid Pack Lot # [id number]   CATARACT EXTRACTION W/PHACO Left 10/02/2017   Procedure: CATARACT EXTRACTION PHACO AND INTRAOCULAR LENS PLACEMENT (IOC);  Surgeon: Galen Manila, MD;  Location: [facility];  Service: Ophthalmology;  Laterality: Left;  [place] 00:25.9 AP% 6.9 CDE 1.80 Fluid Pack Lot # [id number]   CLOSED REDUCTION NASAL FRACTURE N/A 05/21/2018   Procedure: CLOSED REDUCTION NASAL FRACTURE;  Surgeon: Geanie Logan, MD;  Location: [facility];  Service: ENT;  Laterality: N/A;  Latex sensitivity   COLONOSCOPY     EYE SURGERY     Lasik   FRACTURE SURGERY     KNEE SURGERY     left   LAPAROSCOPIC NISSEN FUNDOPLICATION N/A 01/12/2017   Procedure: LAPAROSCOPIC NISSEN TAKEDOWN AND UPPER ENDOSCOPY;  Surgeon: Luretha Edgar, MD;  Location: [facility];  Service: General;  Laterality: N/A;   LEFT HEART CATH AND CORONARY ANGIOGRAPHY N/A 05/10/2020   Procedure: LEFT HEART CATH AND CORONARY ANGIOGRAPHY;  Surgeon: Lyn Records, MD;  Location: [facility];  Service: Cardiovascular;  Laterality: N/A;   NASAL SEPTUM SURGERY     NISSEN FUNDOPLICATION     PARTIAL HYSTERECTOMY     POLYPECTOMY     repair cystocele and rectocele     thyroid  ablation     TUBAL LIGATION     UPPER GASTROINTESTINAL ENDOSCOPY     UPPER GI ENDOSCOPY  01/12/2017   Procedure: UPPER GI ENDOSCOPY;  Surgeon: Luretha Haque, MD;  Location: [facility];  Service: General;;   Social History   Social History Narrative   Not on file   Immunization History  Administered Date(s) Administered   Influenza Inj Mdck Quad Pf 05/08/2017   Influenza,inj,Quad PF,6+ Mos 03/08/2018, 03/29/2019, 06/25/2019, 03/31/2020, 04/14/2021   Influenza-Unspecified 03/31/2020   PFIZER Comirnaty(Gray Top)Covid-19 Tri-Sucrose Vaccine 09/20/2020   PFIZER(Purple Top)SARS-COV-2 Vaccination 09/28/2019, 10/20/2019, 03/02/2020   Zoster Recombinat (Shingrix) 09/06/2020, 02/28/2021     Objective: Vital Signs: BP (!) 142/84 (BP Location: Left Arm, Patient Position: Sitting, Cuff Size: Normal)   Pulse 67   Resp 16   Ht 5\' 6"  (1.676 m)   Wt 229 lb 12.8 oz (104.2 kg)   BMI 37.09 kg/m    Physical Exam Vitals and nursing note reviewed.  Constitutional:      Appearance: She is well-developed.  HENT:     Head: Normocephalic and atraumatic.  Eyes:     Conjunctiva/sclera: Conjunctivae normal.  Cardiovascular:     Rate and Rhythm: Normal rate and regular rhythm.     Heart sounds: Normal heart sounds.  Pulmonary:     Effort: Pulmonary effort is normal.     Breath sounds: Normal breath sounds.  Abdominal:     General: Bowel sounds are normal.     Palpations: Abdomen is soft.  Musculoskeletal:     Cervical back: Normal range of motion.  Skin:    General: Skin is warm and dry.     Capillary Refill: Capillary refill takes less than 2 seconds.     Comments: Healing pustular psoriasis and skin peeling noted on the plantar aspect of both feet.  Neurological:     Mental Status: She is alert and oriented to person, place, and time.  Psychiatric:        Behavior: Behavior normal.      Musculoskeletal Exam: C-spine, thoracic spine, lumbar spine have good range of motion.  Some midline  spinal tenderness in the lower lumbar region.  Tenderness over both SI joints, left greater than right.  Shoulder joints, elbow joints, wrist joints, MCPs, PIPs, DIPs have good range of motion with no synovitis.  Complete fist formation bilaterally.  Some tenderness to palpation over the right wrist.  PIP and DIP prominence noted.  Hip joints have good range of motion with no groin pain.  Knee joints have good range of motion with no warmth or effusion.  Ankle joints have good range of motion with no joint tenderness or synovitis.  Tenderness over the Achilles tendon insertion site bilaterally, left greater than right.  No tenderness or synovitis over MTP joints.  CDAI Exam: CDAI Score: -- Patient Global: --; Provider Global: -- Swollen: --; Tender: -- Joint Exam 12/18/2022   No joint exam has been documented for this visit   There is currently no information documented on the homunculus. Go to the Rheumatology activity and complete the homunculus joint exam.  Investigation: No additional findings.  Imaging: No results found.  Recent Labs: Lab Results  Component Value Date   WBC 8.5 12/07/2022   HGB 13.8 12/07/2022   PLT 281.0 12/07/2022   NA 135 12/07/2022   K 4.2 12/07/2022   CL 96 12/07/2022   CO2 30 12/07/2022   GLUCOSE 102 (H) 12/07/2022   BUN 13 12/07/2022   CREATININE 0.95 12/07/2022   BILITOT 0.6 12/07/2022   ALKPHOS 47 12/07/2022   AST 30 12/07/2022   ALT 24 12/07/2022   PROT 7.4 12/07/2022   ALBUMIN 4.0 12/07/2022   CALCIUM 9.4 12/07/2022   GFRAA 86 08/10/2020   QFTBGOLDPLUS Negative 10/16/2022    Speciality Comments: Inadequate response to Enbrel, Humira, Simponi,  Remicade-allergic response  Procedures:  No procedures performed Allergies: Demerol [meperidine], Imuran [azathioprine], Meperidine hcl, Remicade [infliximab], Sulfasalazine, Avelox [moxifloxacin], Doxycycline, Erythromycin, and Latex     Assessment / Plan:     Visit Diagnoses: Psoriatic  arthritis (HCC): She has no synovitis or dactylitis on examination today.  She has ongoing SI joint pain bilaterally, left greater than right as well as Achilles tendinitis bilaterally, left greater than right. ESR and CRP WNL on 12/07/22.  No nocturnal pain.  She remains on Xeljanz 5 mg 1 tablet by mouth twice daily and methotrexate 0.6 mL sq injections once weekly.  She is tolerating combination therapy without any side effects or injection site reactions from methotrexate.  She has not missed any doses recently.  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  She uses clobetasol ointment topically as needed. She is currently experiencing symptoms of an ulcerative colitis flare and scheduled for an abdominal CT tomorrow for further evaluation.  According to the patient GI has considered increasing the dose of Xeljanz to 10 mg twice daily as well as a course of steroids.  She will notify us if any medication changes are made.   She will follow-up in the office in 3 months or sooner if needed.  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  High risk medication use - Xeljanz 5 mg 1 tablet by mouth twice daily, methotrexate 0.6 ml sq once weekly, folic acid 2 mg po daily. Discussed the increased risk for immunosuppression if the dose of [name] is increased. Previous therapy includes Enbrel, Humira, and Imuran. CBC, hepatic function panel, and BMP updated on 12/07/22.  Her next lab work will be due in August and every 3 months to monitor for drug toxicity. TB gold negative on 10/16/22.  No recent or recurrent infections.  Discussed the importance of holding xeljanz and methotrexate if she develops signs or symptoms of an infection and to resume once the infection has completely cleared.  Discussed the increased risk for major cardiovascular events in patients taking Xeljanz.  Raynaud's phenomenon  without gangrene: Not currently active.  No signs of sclerodactyly noted.  S/p bilateral carpal tunnel release - bilateral carpal tunnel release by Dr. Mina Marble in May 2023.  Asymptomatic currently.  Primary osteoarthritis of both knees: Good range of motion of both knee joints on examination.  No warmth or effusion noted.  Achilles tendonitis, bilateral: She is having increased discomfort in the Achilles tendon bilaterally, left greater than right.  She has some tenderness at the left Achilles tendon insertion site on examination today.  DDD (degenerative disc disease), lumbar: Some midline spinal tenderness in the lower lumbar region.  No symptoms of radiculopathy.  History of ulcerative colitis: She is been experiencing symptoms of an ulcerative colitis flare for the past 3 weeks.  She has been having more frequent bouts of diarrhea as well as blood in her stool.  She was evaluated by GI on 12/07/2022.  She is scheduled for a CT tomorrow. She remains on Xeljanz 5 mg 1 tablet twice daily and methotrexate 0.6 mL sq injections once weekly.  She has not missed any doses of these medications recently.  She denies any identifiable trigger for the UC flare.  There has been discussion of a prednisone taper as well as increasing the dose of Xeljanz to 10 mg twice daily pending CT results.  Other medical conditions are listed as follows:  History of gastroesophageal reflux (GERD)  History of IBS  History of hypertension  Vitamin D deficiency  Former smoker  Orders: No orders of the defined types were placed in this encounter.  No orders of the defined types were placed in this encounter.   Follow-Up Instructions: Return in about 3 months (around 03/20/2023) for Psoriatic arthritis.   Gearldine Bienenstock, PA-C  Note - This record has been created using Dragon software.  Chart creation errors have been sought, but may not always  have been located. Such creation errors do not reflect on  the  standard of medical care.

## 2022-12-05 ENCOUNTER — Ambulatory Visit (INDEPENDENT_AMBULATORY_CARE_PROVIDER_SITE_OTHER): Payer: Medicare HMO | Admitting: Emergency Medicine

## 2022-12-05 ENCOUNTER — Ambulatory Visit: Payer: Medicare HMO | Admitting: Emergency Medicine

## 2022-12-05 ENCOUNTER — Encounter: Payer: Self-pay | Admitting: Emergency Medicine

## 2022-12-05 VITALS — BP 120/74 | HR 89 | Temp 98.1°F | Ht 66.0 in | Wt 228.0 lb

## 2022-12-05 DIAGNOSIS — Z87891 Personal history of nicotine dependence: Secondary | ICD-10-CM

## 2022-12-05 DIAGNOSIS — G478 Other sleep disorders: Secondary | ICD-10-CM

## 2022-12-05 DIAGNOSIS — J449 Chronic obstructive pulmonary disease, unspecified: Secondary | ICD-10-CM

## 2022-12-05 LAB — PULMONARY FUNCTION TEST
DL/VA % pred: 113 %
DL/VA: 4.73 ml/min/mmHg/L
DLCO cor % pred: 107 %
DLCO cor: 23.42 ml/min/mmHg
DLCO unc % pred: 107 %
DLCO unc: 23.56 ml/min/mmHg
FEF 25-75 Post: 2.29 L/sec
FEF 25-75 Pre: 1.85 L/sec
FEF2575-%Change-Post: 23 %
FEF2575-%Pred-Post: 88 %
FEF2575-%Pred-Pre: 71 %
FEV1-%Change-Post: 7 %
FEV1-%Pred-Post: 84 %
FEV1-%Pred-Pre: 78 %
FEV1-Post: 2.38 L
FEV1-Pre: 2.21 L
FEV1FVC-%Change-Post: -1 %
FEV1FVC-%Pred-Pre: 96 %
FEV6-%Change-Post: 8 %
FEV6-%Pred-Post: 89 %
FEV6-%Pred-Pre: 82 %
FEV6-Post: 3.16 L
FEV6-Pre: 2.91 L
FEV6FVC-%Pred-Post: 103 %
FEV6FVC-%Pred-Pre: 103 %
FVC-%Change-Post: 8 %
FVC-%Pred-Post: 87 %
FVC-%Pred-Pre: 80 %
FVC-Post: 3.16 L
FVC-Pre: 2.91 L
Post FEV1/FVC ratio: 75 %
Post FEV6/FVC ratio: 100 %
Pre FEV1/FVC ratio: 76 %
Pre FEV6/FVC Ratio: 100 %
RV % pred: 123 %
RV: 2.5 L
TLC % pred: 104 %
TLC: 5.59 L

## 2022-12-05 NOTE — Progress Notes (Signed)
Full PFT Performed Today  

## 2022-12-05 NOTE — Assessment & Plan Note (Signed)
No longer smoking. Get your lung cancer screening CT chest in September 2024 as planned. Follow-up with Dr. Delton Coombes in September after your CT scan, call sooner if you have any problems.

## 2022-12-05 NOTE — Patient Instructions (Addendum)
We reviewed your pulmonary function testing today.  There is evidence for some mild to moderate abnormal airflow present, consistent with very mild COPD. Please keep your albuterol available to use either 2 puffs or 1 nebulizer treatment when you needed for shortness of breath, chest tightness, wheezing. I am glad that your cough is improved. Please continue to use your omeprazole 20 mg once daily.  Take 1 hour around food. Please continue your Singulair, Xyzal, fluticasone nasal spray as you have been using them. Please call if you are having flaring cough symptoms and we can try to make adjustments. Get your lung cancer screening CT chest in September 2024 as planned. Follow-up with Dr. Delton Coombes in September after your CT scan, call sooner if you have any problems.

## 2022-12-05 NOTE — Patient Instructions (Signed)
Full PFT Performed Today  

## 2022-12-05 NOTE — Assessment & Plan Note (Signed)
I am glad that your cough is improved. Please continue to use your omeprazole 20 mg once daily.  Take 1 hour around food. Please continue your Singulair, Xyzal, fluticasone nasal spray as you have been using them. Please call if you are having flaring cough symptoms and we can try to make adjustments.

## 2022-12-05 NOTE — Assessment & Plan Note (Signed)
Pulmonary function testing overall reassuring.  She does have mild to moderate obstruction but minimal symptoms.  I do not think we need to be on scheduled BD therapy yet.  She does have albuterol to use if needed.  We reviewed your pulmonary function testing today.  There is evidence for some mild to moderate abnormal airflow present, consistent with very mild COPD. Please keep your albuterol available to use either 2 puffs or 1 nebulizer treatment when you needed for shortness of breath, chest tightness, wheezing.

## 2022-12-05 NOTE — Progress Notes (Signed)
Subjective:    Patient ID: [name], female    DOB: 03-Jan-1966, 57 y.o.   MRN: [id number]  HPI   ROV 11/02/22 --57 year old woman who presents today to reestablish care.  She has a history of former tobacco, COPD, mild OSA, obesity with some superimposed restrictive disease.  Also with CAD, psoriatic arthritis, ulcerative colitis.  She has been on methotrexate and [name] in the past.  She deals with chronic cough in the setting of all the above as well as allergic rhinitis and GERD. She has some baseline exertional SOB, uses albuterol about once a day. She had flu in 07/2022, treated w abx, prednisone. She has residual cough, wheeze, some voice instability. Cough usually non-productive. Throat noise and wheeze.  On singulair, xyzal, flonase NS. On omeprazole 20.   Lung cancer screening CT chest 03/28/2022 reviewed by me showed mild centrilobular emphysema, stable nodules with a 1.1 mm right middle lobe nodule, RADS 2 study  ROV 12/05/2022 --[name] follows up today for history of COPD, upper airway resistance syndrome and upper airway cough.  I saw her a month ago and she was experiencing more cough, more stridor.  I tried changing her omeprazole to higher dose PPI (pantoprazole twice daily) to see if she would get more benefit and if this would help her cough.  We also tried [employer] and Hycodan for cough suppression.  She has been on Singulair, Xyzal, fluticasone nasal spray.  Pulmonary function testing done today as below.Her next lung cancer screening CT is due in September 2024 Today she reports that she is significantly improved. She is back down to normal omeprazole dose. No longer needing cough suppression. She has albuterol available to use w exertion - not every day currently.   PFT 12/05/2022 reviewed by me, shows mild to moderate obstruction without a bronchodilator response.  FEV1 2.21 L (78% predicted).  Normal lung volumes.  Normal diffusion capacity   Review of Systems   Constitutional:  Negative for fever and unexpected weight change.  HENT:  Positive for postnasal drip. Negative for congestion, dental problem, ear pain, nosebleeds, rhinorrhea, sinus pressure, sneezing and trouble swallowing.   Eyes:  Negative for redness and itching.  Respiratory:  Positive for shortness of breath and wheezing. Negative for cough and chest tightness.   Cardiovascular:  Negative for palpitations and leg swelling.  Gastrointestinal:  Negative for nausea and vomiting.  Genitourinary:  Negative for dysuria.  Musculoskeletal:  Negative for joint swelling.  Skin:  Negative for rash.  Allergic/Immunologic: Negative.  Negative for environmental allergies, food allergies and immunocompromised state.  Neurological:  Negative for headaches.  Hematological:  Does not bruise/bleed easily.  Psychiatric/Behavioral:  Negative for dysphoric mood. The patient is not nervous/anxious.        Objective:   Physical Exam Vitals:   12/05/22 1131  BP: 120/74  Pulse: 89  Temp: 98.1 F (36.7 C)  TempSrc: Oral  SpO2: 96%  Weight: 228 lb (103.4 kg)  Height: 5\' 6"  (1.676 m)   Gen: Pleasant, overwt woman, in no distress,  normal affect  ENT: No lesions,  mouth clear,  oropharynx clear, no postnasal drip  Neck: No JVD, no stridor  Lungs: No use of accessory muscles, no wheeze, decreased at bases  Cardiovascular: RRR, heart sounds normal, no murmur or gallops, no peripheral edema  Musculoskeletal: No deformities, no cyanosis or clubbing  Neuro: alert, non focal  Skin: Warm, no lesions or rash      Assessment & Plan:  COPD (chronic obstructive pulmonary disease) (HCC) Pulmonary function testing overall reassuring.  She does have mild to moderate obstruction but minimal symptoms.  I do not think we need to be on scheduled BD therapy yet.  She does have albuterol to use if needed.  We reviewed your pulmonary function testing today.  There is evidence for some mild to moderate  abnormal airflow present, consistent with very mild COPD. Please keep your albuterol available to use either 2 puffs or 1 nebulizer treatment when you needed for shortness of breath, chest tightness, wheezing.  Upper airway resistance syndrome I am glad that your cough is improved. Please continue to use your omeprazole 20 mg once daily.  Take 1 hour around food. Please continue your Singulair, Xyzal, fluticasone nasal spray as you have been using them. Please call if you are having flaring cough symptoms and we can try to make adjustments.  History of tobacco use No longer smoking. Get your lung cancer screening CT chest in September 2024 as planned. Follow-up with Dr. Delton Coombes in September after your CT scan, call sooner if you have any problems.  Levy Pupa, MD, PhD 12/05/2022, 12:00 PM [facility] [phone] or if no answer [phone]

## 2022-12-06 ENCOUNTER — Ambulatory Visit: Payer: Medicare HMO | Admitting: Skilled Nursing Facility1

## 2022-12-07 ENCOUNTER — Ambulatory Visit: Payer: Medicare HMO | Admitting: Physician Assistant

## 2022-12-07 ENCOUNTER — Other Ambulatory Visit (INDEPENDENT_AMBULATORY_CARE_PROVIDER_SITE_OTHER): Payer: Medicare HMO

## 2022-12-07 ENCOUNTER — Encounter: Payer: Self-pay | Admitting: Physician Assistant

## 2022-12-07 ENCOUNTER — Other Ambulatory Visit: Payer: Self-pay

## 2022-12-07 VITALS — BP 118/72 | HR 82 | Ht 66.0 in | Wt 223.0 lb

## 2022-12-07 DIAGNOSIS — K625 Hemorrhage of anus and rectum: Secondary | ICD-10-CM | POA: Diagnosis not present

## 2022-12-07 DIAGNOSIS — K760 Fatty (change of) liver, not elsewhere classified: Secondary | ICD-10-CM

## 2022-12-07 DIAGNOSIS — K588 Other irritable bowel syndrome: Secondary | ICD-10-CM

## 2022-12-07 DIAGNOSIS — K5100 Ulcerative (chronic) pancolitis without complications: Secondary | ICD-10-CM

## 2022-12-07 DIAGNOSIS — K219 Gastro-esophageal reflux disease without esophagitis: Secondary | ICD-10-CM

## 2022-12-07 DIAGNOSIS — L4050 Arthropathic psoriasis, unspecified: Secondary | ICD-10-CM

## 2022-12-07 DIAGNOSIS — R1032 Left lower quadrant pain: Secondary | ICD-10-CM | POA: Diagnosis not present

## 2022-12-07 DIAGNOSIS — K5180 Other ulcerative colitis without complications: Secondary | ICD-10-CM | POA: Diagnosis not present

## 2022-12-07 LAB — BASIC METABOLIC PANEL
BUN: 13 mg/dL (ref 6–23)
CO2: 30 mEq/L (ref 19–32)
Calcium: 9.4 mg/dL (ref 8.4–10.5)
Chloride: 96 mEq/L (ref 96–112)
Creatinine, Ser: 0.95 mg/dL (ref 0.40–1.20)
GFR: 66.63 mL/min (ref >60.00)
Glucose, Bld: 102 mg/dL — ABNORMAL HIGH (ref 70–99)
Potassium: 4.2 mEq/L (ref 3.5–5.1)
Sodium: 135 mEq/L (ref 135–145)

## 2022-12-07 LAB — CBC WITH DIFFERENTIAL/PLATELET
Basophils Absolute: 0 10*3/uL (ref 0.0–0.1)
Basophils Relative: 0.5 % (ref 0.0–3.0)
Eosinophils Absolute: 0.1 10*3/uL (ref 0.0–0.7)
Eosinophils Relative: 0.7 % (ref 0.0–5.0)
HCT: 41 % (ref 36.0–46.0)
Hemoglobin: 13.8 g/dL (ref 12.0–15.0)
Lymphocytes Relative: 26.8 % (ref 12.0–46.0)
Lymphs Abs: 2.3 10*3/uL (ref 0.7–4.0)
MCHC: 33.6 g/dL (ref 30.0–36.0)
MCV: 95.2 fl (ref 78.0–100.0)
Monocytes Absolute: 0.7 10*3/uL (ref 0.1–1.0)
Monocytes Relative: 7.8 % (ref 3.0–12.0)
Neutro Abs: 5.4 10*3/uL (ref 1.4–7.7)
Neutrophils Relative %: 64.2 % (ref 43.0–77.0)
Platelets: 281 10*3/uL (ref 150.0–400.0)
RBC: 4.31 Mil/uL (ref 3.87–5.11)
RDW: 13.2 % (ref 11.5–15.5)
WBC: 8.5 10*3/uL (ref 4.0–10.5)

## 2022-12-07 LAB — HEPATIC FUNCTION PANEL
ALT: 24 U/L (ref 0–35)
AST: 30 U/L (ref 0–37)
Albumin: 4 g/dL (ref 3.5–5.2)
Alkaline Phosphatase: 47 U/L (ref 39–117)
Bilirubin, Direct: 0.1 mg/dL (ref 0.0–0.3)
Total Bilirubin: 0.6 mg/dL (ref 0.2–1.2)
Total Protein: 7.4 g/dL (ref 6.0–8.3)

## 2022-12-07 LAB — SEDIMENTATION RATE: Sed Rate: 11 mm/hr (ref 0–30)

## 2022-12-07 LAB — HIGH SENSITIVITY CRP: CRP, High Sensitivity: 1.82 mg/L (ref 0.000–5.000)

## 2022-12-07 MED ORDER — DICYCLOMINE HCL 20 MG PO TABS: 20.0000 mg | ORAL_TABLET | ORAL | 1 refills | Status: AC | PRN

## 2022-12-07 MED ORDER — MESALAMINE 1000 MG RE SUPP: 1000.0000 mg | Freq: Every day | RECTAL | 0 refills | Status: DC

## 2022-12-07 NOTE — Patient Instructions (Signed)
You have been scheduled for a CT scan of the abdomen and pelvis at [facility], 1st floor Radiology. You are scheduled on 01/02/2023 at 10:00am. You should arrive 30 minutes prior to your appointment time for registration.  We are giving you 2 bottles of contrast today that you will need to drink before arriving for the exam. Please follow the written instructions below on the day of your exam:   1) Do not eat anything 4 hours prior to your test   You may take any medications as prescribed with a small amount of water, if necessary. If you take any of the following medications: METFORMIN, GLUCOPHAGE, GLUCOVANCE, AVANDAMET, RIOMET, FORTAMET, ACTOPLUS MET, JANUMET, GLUMETZA or METAGLIP, you MAY be asked to HOLD this medication 48 hours AFTER the exam.   The purpose of you drinking the oral contrast is to aid in the visualization of your intestinal tract. The contrast solution may cause some diarrhea. Depending on your individual set of symptoms, you may also receive an intravenous injection of x-ray contrast/dye. Plan on being at [facility] for 45 minutes or longer, depending on the type of exam you are having performed.   If you have any questions regarding your exam or if you need to reschedule, you may call [facility] at [phone] between the hours of 8:00 am and 5:00 pm, Monday-Friday.   Your provider has requested that you go to the basement level for lab work before leaving today. Press "B" on the elevator. The lab is located at the first door on the left as you exit the elevator.  We have scheduled you for a follow up on 01/25/2023 at 11:20pm with Dr Lavon Paganini   We have sent the following medications to your pharmacy for you to pick up at your convenience: Bentyl Canasa _______________________________________________________  If your blood pressure at your visit was 140/90 or greater, please contact your primary care physician to follow up on  this.  _______________________________________________________  If you are age 61 or older, your body mass index should be between 23-30. Your Body mass index is 35.99 kg/m. If this is out of the aforementioned range listed, please consider follow up with your Primary Care Provider.  If you are age 6 or younger, your body mass index should be between 19-25. Your Body mass index is 35.99 kg/m. If this is out of the aformentioned range listed, please consider follow up with your Primary Care Provider.   ________________________________________________________  The [facility] providers would like to encourage you to use [facility] to communicate with providers for non-urgent requests or questions.  Due to long hold times on the telephone, sending your provider a message by [facility] may be a faster and more efficient way to get a response.  Please allow 48 business hours for a response.  Please remember that this is for non-urgent requests.  _______________________________________________________ It was a pleasure to see you today!  Thank you for trusting me with your gastrointestinal care!

## 2022-12-08 ENCOUNTER — Other Ambulatory Visit: Payer: Medicare HMO

## 2022-12-08 DIAGNOSIS — K625 Hemorrhage of anus and rectum: Secondary | ICD-10-CM

## 2022-12-08 DIAGNOSIS — R1032 Left lower quadrant pain: Secondary | ICD-10-CM

## 2022-12-08 DIAGNOSIS — A048 Other specified bacterial intestinal infections: Secondary | ICD-10-CM | POA: Diagnosis not present

## 2022-12-08 DIAGNOSIS — A09 Infectious gastroenteritis and colitis, unspecified: Secondary | ICD-10-CM | POA: Diagnosis not present

## 2022-12-08 DIAGNOSIS — K5100 Ulcerative (chronic) pancolitis without complications: Secondary | ICD-10-CM | POA: Diagnosis not present

## 2022-12-11 LAB — GI PROFILE, STOOL, PCR

## 2022-12-12 ENCOUNTER — Other Ambulatory Visit (HOSPITAL_COMMUNITY): Payer: Self-pay

## 2022-12-12 NOTE — Progress Notes (Unsigned)
Office Visit    Patient Name: [name] Date of Encounter: 12/13/2022  PCP:  Lupita Raider, MD   [facility]  Cardiologist:  Christell Constant, MD  Advanced Practice Provider:  No care team member to display Electrophysiologist:  None   HPI    [name] is a 57 y.o. female with a history of hypertension, hyperlipidemia,  former tobacco use (quit in 2011), morbid obesity, nonobstructive coronary artery disease, aortic atherosclerosis, bicuspid aortic valve and the family who presented 05/06/2020 with angina pectoralis presents today for follow-up appointment.  In 2021 was found to have nonobstructive CAD on [facility] 05/10/2020.  Echocardiogram 05/27/2020 without evidence of bicuspid valve.  2022 there were no changes.  Patient noted at last appointment May 2023 she was doing well at that time.  Was playing with her grandkids.  No symptoms.  No interval hospital/ED visits.  Rare chest pain with exercise but resolves with inhaler.  No SOB but DOE and no PND/orthopnea.  No weight gain or leg swelling.  No palpitations or syncope.  Was planning to go to [place] then a large trip back culminates in [place].  Today, she says that overall she has been doing well.  She does have some lower extremity edema which is new for her.  Typically it accumulates throughout the day and resolves overnight.  She is due for an echocardiogram.  Last echo was 2021.  Otherwise, no chest pain.  Shortness of breath is chronic and stable.  She follows Dr. Delton Coombes with pulmonary.  Reports no shortness of breath nor dyspnea on exertion. Reports no chest pain, pressure, or tightness. No orthopnea, PND. Reports no palpitations.   Past Medical History    Past Medical History:  Diagnosis Date   Allergy    Anxiety    Arthritis    Asthma    Back pain    Cataract    both eyes  surgically repaired   COPD (chronic obstructive pulmonary disease) (HCC)    Depression     treated   Dyspnea    Fibrocystic breast disease    GERD (gastroesophageal reflux disease)    History of hiatal hernia    repaired   History of kidney stones    questionable   Hyperlipidemia    Hypertension    pt denies   Hypothyroidism    Irritable bowel syndrome    Joint pain    Osteoarthritis    Ovarian cyst    Palpitations    Pancreatitis    PONV (postoperative nausea and vomiting)    NO TROUBLE WITH CATARACT SURGERY   Psoriasis    Psoriatic arthritis (HCC) 05/12/2016   Poor response to Humira Inadequate response to Enbrel Inadequate response to Simponi   Shortness of breath    Tachycardia    Ulcerative colitis    Past Surgical History:  Procedure Laterality Date   ABDOMINAL HYSTERECTOMY     ANKLE RECONSTRUCTION     left   bladder tuck     CARPAL TUNNEL RELEASE     CATARACT EXTRACTION W/PHACO Right 09/11/2017   Procedure: CATARACT EXTRACTION PHACO AND INTRAOCULAR LENS PLACEMENT (IOC);  Surgeon: Galen Manila, MD;  Location: [facility]S;  Service: Ophthalmology;  Laterality: Right;  [place] 00:18.5 AP% 6.9 CDE 1.29 Fluid Pack Lot # [id number]   CATARACT EXTRACTION W/PHACO Left 10/02/2017   Procedure: CATARACT EXTRACTION PHACO AND INTRAOCULAR LENS PLACEMENT (IOC);  Surgeon: Galen Manila, MD;  Location: [facility] ORS;  Service:  Ophthalmology;  Laterality: Left;  [place] 00:25.9 AP% 6.9 CDE 1.80 Fluid Pack Lot # [id number]   CLOSED REDUCTION NASAL FRACTURE N/A 05/21/2018   Procedure: CLOSED REDUCTION NASAL FRACTURE;  Surgeon: Geanie Logan, MD;  Location: [facility];  Service: ENT;  Laterality: N/A;  Latex sensitivity   COLONOSCOPY     EYE SURGERY     Lasik   FRACTURE SURGERY     KNEE SURGERY     left   LAPAROSCOPIC NISSEN FUNDOPLICATION N/A 01/12/2017   Procedure: LAPAROSCOPIC NISSEN TAKEDOWN AND UPPER ENDOSCOPY;  Surgeon: Luretha Everman, MD;  Location: [facility];  Service: General;  Laterality: N/A;   LEFT HEART CATH AND CORONARY ANGIOGRAPHY N/A 05/10/2020    Procedure: LEFT HEART CATH AND CORONARY ANGIOGRAPHY;  Surgeon: Lyn Records, MD;  Location: [facility];  Service: Cardiovascular;  Laterality: N/A;   NASAL SEPTUM SURGERY     NISSEN FUNDOPLICATION     PARTIAL HYSTERECTOMY     POLYPECTOMY     repair cystocele and rectocele     thyroid ablation     TUBAL LIGATION     UPPER GASTROINTESTINAL ENDOSCOPY     UPPER GI ENDOSCOPY  01/12/2017   Procedure: UPPER GI ENDOSCOPY;  Surgeon: Luretha Rothschild, MD;  Location: [facility];  Service: General;;    Allergies  Allergies  Allergen Reactions   Demerol [Meperidine] Hives   Imuran [Azathioprine] Other (See Comments)    Pancreatitis   Meperidine Hcl Hives   Remicade [Infliximab] Hives and Other (See Comments)    Redness   Sulfasalazine Swelling and Hives   Avelox [Moxifloxacin] Rash   Doxycycline Rash   Erythromycin Rash   Latex Rash    Gloves and elastic in underwear    EKGs/Labs/Other Studies Reviewed:   The following studies were reviewed today: Cardiac Studies & Procedures   CARDIAC CATHETERIZATION  CARDIAC CATHETERIZATION 05/10/2020  Narrative  Normal left ventricular function with LVEDP 15%.  Estimated ejection fraction 55%.  Widely patent left main.  10 to 20% ostial LAD.  10 to 20% ostial circumflex.  Dominant normal RCA.  RECOMMENDATIONS:   Continue aggressive risk factor modification.  Exercise program.  If convincing story for angina, consider microvascular dysfunction.  Findings Coronary Findings Diagnostic  Dominance: Right  Left Anterior Descending Ost LAD to Prox LAD lesion is 20% stenosed. Dist LAD lesion is 20% stenosed.  Left Circumflex Ost Cx to Prox Cx lesion is 20% stenosed.  Intervention  No interventions have been documented.     ECHOCARDIOGRAM  ECHOCARDIOGRAM COMPLETE 05/27/2020  Narrative ECHOCARDIOGRAM REPORT    Patient Name:   [name] Date of Exam: 05/27/2020 Medical Rec #:  [id number]       Height:        66.0 in Accession #:    [id number]      Weight:       222.0 lb Date of Birth:  July 13, 1966       BSA:          2.091 m Patient Age:    54 years        BP:           128/76 mmHg Patient Gender: F               HR:           72 bpm. Exam Location:  [address]  Procedure: 2D Echo, 3D Echo, Cardiac Doppler and Color Doppler  Indications:    R07.9 Chest Pain  History:        Patient has no prior history of Echocardiogram examinations. CAD, COPD, Signs/Symptoms:Chest Pain, Shortness of Breath and Dyspnea; Risk Factors:Family History of Coronary Artery Disease, Hypertension, Dyslipidemia and Former Smoker. Palpitations, Obesity.  Sonographer:    Farrel Conners RDCS Referring Phys: [id number] [facility]  IMPRESSIONS   1. Left ventricular ejection fraction, by estimation, is 60 to 65%. The left ventricle has normal function. The left ventricle has no regional wall motion abnormalities. Left ventricular diastolic parameters were normal. 2. Right ventricular systolic function is normal. The right ventricular size is normal. Tricuspid regurgitation signal is inadequate for assessing PA pressure. 3. The mitral valve is normal in structure. No evidence of mitral valve regurgitation. No evidence of mitral stenosis. 4. The aortic valve is tricuspid. Aortic valve regurgitation is not visualized. No aortic stenosis is present. 5. The inferior vena cava is normal in size with greater than 50% respiratory variability, suggesting right atrial pressure of 3 mmHg.  Conclusion(s)/Recommendation(s): Normal biventricular function without evidence of hemodynamically significant valvular heart disease.  FINDINGS Left Ventricle: Left ventricular ejection fraction, by estimation, is 60 to 65%. The left ventricle has normal function. The left ventricle has no regional wall motion abnormalities. The left ventricular internal cavity size was normal in size. There is no left ventricular hypertrophy. Left  ventricular diastolic parameters were normal.  Right Ventricle: The right ventricular size is normal. No increase in right ventricular wall thickness. Right ventricular systolic function is normal. Tricuspid regurgitation signal is inadequate for assessing PA pressure.  Left Atrium: Left atrial size was normal in size.  Right Atrium: Right atrial size was normal in size.  Pericardium: There is no evidence of pericardial effusion. Presence of pericardial fat pad.  Mitral Valve: The mitral valve is normal in structure. No evidence of mitral valve regurgitation. No evidence of mitral valve stenosis.  Tricuspid Valve: The tricuspid valve is normal in structure. Tricuspid valve regurgitation is trivial. No evidence of tricuspid stenosis.  Aortic Valve: The aortic valve is tricuspid. Aortic valve regurgitation is not visualized. No aortic stenosis is present.  Pulmonic Valve: The pulmonic valve was normal in structure. Pulmonic valve regurgitation is not visualized. No evidence of pulmonic stenosis.  Aorta: The aortic root is normal in size and structure.  Venous: The inferior vena cava is normal in size with greater than 50% respiratory variability, suggesting right atrial pressure of 3 mmHg.  IAS/Shunts: No atrial level shunt detected by color flow Doppler.   LEFT VENTRICLE PLAX 2D LVIDd:         4.00 cm  Diastology LVIDs:         2.50 cm  LV e' medial:    7.72 cm/s LV PW:         0.80 cm  LV E/e' medial:  11.8 LV IVS:        0.80 cm  LV e' lateral:   10.00 cm/s LVOT diam:     2.30 cm  LV E/e' lateral: 9.1 LV SV:         118 LV SV Index:   56 LVOT Area:     4.15 cm  3D Volume EF: 3D EF:        64 % LV EDV:       133 ml LV ESV:       49 ml LV SV:        85 ml  RIGHT VENTRICLE RV S prime:     14.60 cm/s TAPSE (M-mode):  2.7 cm  LEFT ATRIUM             Index       RIGHT ATRIUM           Index LA diam:        4.20 cm 2.01 cm/m  RA Area:     10.50 cm LA Vol (A2C):   55.0 ml  26.31 ml/m RA Volume:   21.30 ml  10.19 ml/m LA Vol (A4C):   47.8 ml 22.87 ml/m LA Biplane Vol: 52.2 ml 24.97 ml/m AORTIC VALVE LVOT Vmax:   121.00 cm/s LVOT Vmean:  79.300 cm/s LVOT VTI:    0.283 m  AORTA Ao Root diam: 2.90 cm Ao Asc diam:  3.00 cm  MITRAL VALVE MV Area (PHT)  cm         SHUNTS MV Decel Time: 206 msec    Systemic VTI:  0.28 m MV E velocity: 91.40 cm/s  Systemic Diam: 2.30 cm MV A velocity: 81.43 cm/s MV E/A ratio:  1.12  Weston Brass MD Electronically signed by Weston Brass MD Signature Date/Time: 05/27/2020/4:47:20 PM    Final              EKG:  EKG is  ordered today.  The ekg ordered today demonstrates normal sinus rhythm, rate 85 bpm  Recent Labs: 12/07/2022: ALT 24; BUN 13; Creatinine, Ser 0.95; Hemoglobin 13.8; Platelets 281.0; Potassium 4.2; Sodium 135  Recent Lipid Panel    Component Value Date/Time   CHOL 145 10/16/2022 1513   TRIG 133 10/16/2022 1513   HDL 61 10/16/2022 1513   CHOLHDL 2.4 10/16/2022 1513   CHOLHDL 2.5 05/07/2018 1024   LDLCALC 61 10/16/2022 1513   LDLCALC 75 05/07/2018 1024     Home Medications   Current Meds  Medication Sig   albuterol (PROVENTIL) (2.5 MG/3ML) 0.083% nebulizer solution Take 3 mLs (2.5 mg total) by nebulization every 6 (six) hours as needed for wheezing or shortness of breath.   albuterol (VENTOLIN HFA) 108 (90 Base) MCG/ACT inhaler Inhale 2 puffs into the lungs every 6 (six) hours as needed for wheezing or shortness of breath.   aspirin-acetaminophen-caffeine (EXCEDRIN MIGRAINE) 250-250-65 MG tablet Take 2 tablets by mouth every 8 (eight) hours as needed for headache.    Cholecalciferol (VITAMIN D3) 50 MCG (2000 UT) CAPS Take 2,000 Units by mouth.   clobetasol ointment (TEMOVATE) 0.05 % Apply 1 application topically 2 (two) times daily.   dicyclomine (BENTYL) 20 MG tablet Take 1 tablet (20 mg total) by mouth as needed.   estradiol (ESTRACE) 1 MG tablet Take 1 mg by mouth daily.    fluticasone (FLONASE) 50 MCG/ACT nasal spray Place 1 spray into both nostrils daily.   folic acid (FOLVITE) 1 MG tablet Take 2 mg by mouth daily.   ibuprofen (ADVIL,MOTRIN) 200 MG tablet Take 400 mg by mouth every 6 (six) hours as needed for mild pain.    levocetirizine (XYZAL) 5 MG tablet Take 5 mg by mouth every evening.   levothyroxine (SYNTHROID) 125 MCG tablet Take 125 mcg by mouth every other day. Alternate with 137 mcg   levothyroxine (SYNTHROID) 137 MCG tablet Take 137 mcg by mouth every other day. Alternate with 125 mcg   mesalamine (CANASA) 1000 MG suppository Place 1 suppository (1,000 mg total) rectally at bedtime.   methotrexate 50 MG/2ML injection INJECT 0.6ML (15MG ) UNDER THE SKIN ONE TIME WEEKLY. DISCARD VIAL 28 DAYS AFTER FIRST USE.   metoprolol succinate (TOPROL-XL) 50 MG 24  hr tablet Take 25 mg by mouth daily. Take with or immediately following a meal.   montelukast (SINGULAIR) 10 MG tablet Take 10 mg by mouth at bedtime.   nitroGLYCERIN (NITROSTAT) 0.4 MG SL tablet Place 1 tablet (0.4 mg total) under the tongue every 5 (five) minutes as needed for chest pain.   omeprazole (PRILOSEC) 20 MG capsule TAKE 1 CAPSULE EVERY DAY BEFORE BREAKFAST   pantoprazole (PROTONIX) 40 MG tablet Take 1 tablet (40 mg total) by mouth 2 (two) times daily.   rosuvastatin (CRESTOR) 20 MG tablet Take 20 mg by mouth daily.   Tuberculin-Allergy Syringes 27G X 1/2" 1 ML KIT Inject 1 Syringe into the skin once a week. To be used with weekly methotrexate injections   XELJANZ 5 MG TABS Take 1 tablet by mouth twice a day     Review of Systems      All other systems reviewed and are otherwise negative except as noted above.  Physical Exam    VS:  BP 124/86   Pulse 85   Ht 5\' 6"  (1.676 m)   Wt 229 lb 12.8 oz (104.2 kg)   SpO2 98%   BMI 37.09 kg/m  , BMI Body mass index is 37.09 kg/m.  Wt Readings from Last 3 Encounters:  12/13/22 229 lb 12.8 oz (104.2 kg)  12/07/22 223 lb (101.2 kg)  12/05/22  228 lb (103.4 kg)     GEN: Well nourished, well developed, in no acute distress. HEENT: normal. Neck: Supple, no JVD, carotid bruits, or masses. Cardiac: RRR, no murmurs, rubs, or gallops. No clubbing, cyanosis, 1-2+ pitting LE edema.  Radials/PT 2+ and equal bilaterally.  Respiratory:  Respirations regular and unlabored, clear to auscultation bilaterally. GI: Soft, nontender, nondistended. MS: No deformity or atrophy. Skin: Warm and dry, no rash. Neuro:  Strength and sensation are intact. Psych: Normal affect.  Assessment & Plan    Coronary artery disease/LE edema -no chest pains, SOB chronic  -lasix 20mg  added x 3 days then PRN -Continue current medications which include metoprolol succinate 25 mg daily, nitro as needed, Crestor 20 mg daily -Not on ASA due to Excedrin use -update echo  Chronic obstructive pulmonary disease -She follows with Dr. Delton Coombes  Aortic atherosclerosis -Continue Crestor 20 mg daily -LDL 61, at goal  Hypertension -Blood pressure well-controlled today -Continue current medication regimen -Continue to monitor at home -Encourage low-sodium, heart healthy diet  Hyperlipidemia -Continue Crestor 20 mg daily -LDL 61, at goal         Disposition: Follow up 3-4 months with Christell Constant, MD or APP.  Signed, Sharlene Dory, PA-C 12/13/2022, 11:00 AM [facility]

## 2022-12-13 ENCOUNTER — Ambulatory Visit: Payer: Medicare HMO | Attending: Physician Assistant | Admitting: Physician Assistant

## 2022-12-13 ENCOUNTER — Encounter: Payer: Self-pay | Admitting: Physician Assistant

## 2022-12-13 VITALS — BP 124/86 | HR 85 | Ht 66.0 in | Wt 229.8 lb

## 2022-12-13 DIAGNOSIS — Z8279 Family history of other congenital malformations, deformations and chromosomal abnormalities: Secondary | ICD-10-CM | POA: Diagnosis not present

## 2022-12-13 DIAGNOSIS — R0600 Dyspnea, unspecified: Secondary | ICD-10-CM

## 2022-12-13 DIAGNOSIS — E785 Hyperlipidemia, unspecified: Secondary | ICD-10-CM | POA: Diagnosis not present

## 2022-12-13 DIAGNOSIS — I10 Essential (primary) hypertension: Secondary | ICD-10-CM | POA: Diagnosis not present

## 2022-12-13 DIAGNOSIS — I700 Atherosclerosis of aorta: Secondary | ICD-10-CM | POA: Diagnosis not present

## 2022-12-13 DIAGNOSIS — J449 Chronic obstructive pulmonary disease, unspecified: Secondary | ICD-10-CM | POA: Diagnosis not present

## 2022-12-13 DIAGNOSIS — I2510 Atherosclerotic heart disease of native coronary artery without angina pectoris: Secondary | ICD-10-CM | POA: Diagnosis not present

## 2022-12-13 LAB — CLOSTRIDIUM DIFFICILE TOXIN B, QUALITATIVE, REAL-TIME PCR: Toxigenic C. Difficile by PCR: NOT DETECTED

## 2022-12-13 MED ORDER — FUROSEMIDE 20 MG PO TABS: 20.0000 mg | ORAL_TABLET | ORAL | 0 refills | Status: DC | PRN

## 2022-12-13 NOTE — Patient Instructions (Addendum)
Medication Instructions:    FOR THREE DAY ONLY :  TAKE LASIX 20 MG ONCE A DAY THEN RESUME TAKING AS NEEDED FOR LOWER EXTREMITY    *If you need a refill on your cardiac medications before your next appointment, please call your pharmacy*   Lab Work:  NONE ORDERED  TODAY    If you have labs (blood work) drawn today and your tests are completely normal, you will receive your results only by: MyChart Message (if you have MyChart) OR A paper copy in the mail If you have any lab test that is abnormal or we need to change your treatment, we will call you to review the results.   Testing/Procedures: Your physician has requested that you have an echocardiogram. Echocardiography is a painless test that uses sound waves to create images of your heart. It provides your doctor with information about the size and shape of your heart and how well your heart's chambers and valves are working. This procedure takes approximately one hour. There are no restrictions for this procedure. Please do NOT wear cologne, perfume, aftershave, or lotions (deodorant is allowed). Please arrive 15 minutes prior to your appointment time.   Follow-Up: At [facility], you and your health needs are our priority.  As part of our continuing mission to provide you with exceptional heart care, we have created designated Provider Care Teams.  These Care Teams include your primary Cardiologist (physician) and Advanced Practice Providers (APPs -  Physician Assistants and Nurse Practitioners) who all work together to provide you with the care you need, when you need it.  We recommend signing up for the patient portal called "MyChart".  Sign up information is provided on this After Visit Summary.  MyChart is used to connect with patients for Virtual Visits (Telemedicine).  Patients are able to view lab/test results, encounter notes, upcoming appointments, etc.  Non-urgent messages can be sent to your provider as well.   To  learn more about what you can do with MyChart, go to [email or web address].    Your next appointment:   3 -4  month(s)  Provider:   Christell Constant, MD  or Jari Favre, PA-C       Other Instructions

## 2022-12-15 LAB — CALPROTECTIN: Calprotectin: 254 mcg/g — ABNORMAL HIGH

## 2022-12-18 ENCOUNTER — Telehealth: Payer: Self-pay | Admitting: Physician Assistant

## 2022-12-18 ENCOUNTER — Encounter: Payer: Self-pay | Admitting: Physician Assistant

## 2022-12-18 ENCOUNTER — Ambulatory Visit: Payer: Medicare HMO | Attending: Physician Assistant | Admitting: Physician Assistant

## 2022-12-18 VITALS — BP 142/84 | HR 67 | Resp 16 | Ht 66.0 in | Wt 229.8 lb

## 2022-12-18 DIAGNOSIS — Z9889 Other specified postprocedural states: Secondary | ICD-10-CM

## 2022-12-18 DIAGNOSIS — M7661 Achilles tendinitis, right leg: Secondary | ICD-10-CM

## 2022-12-18 DIAGNOSIS — I7300 Raynaud's syndrome without gangrene: Secondary | ICD-10-CM | POA: Diagnosis not present

## 2022-12-18 DIAGNOSIS — L4050 Arthropathic psoriasis, unspecified: Secondary | ICD-10-CM

## 2022-12-18 DIAGNOSIS — Z8719 Personal history of other diseases of the digestive system: Secondary | ICD-10-CM | POA: Diagnosis not present

## 2022-12-18 DIAGNOSIS — Z8679 Personal history of other diseases of the circulatory system: Secondary | ICD-10-CM

## 2022-12-18 DIAGNOSIS — Z79899 Other long term (current) drug therapy: Secondary | ICD-10-CM | POA: Diagnosis not present

## 2022-12-18 DIAGNOSIS — L409 Psoriasis, unspecified: Secondary | ICD-10-CM | POA: Diagnosis not present

## 2022-12-18 DIAGNOSIS — M5136 Other intervertebral disc degeneration, lumbar region: Secondary | ICD-10-CM | POA: Diagnosis not present

## 2022-12-18 DIAGNOSIS — E559 Vitamin D deficiency, unspecified: Secondary | ICD-10-CM

## 2022-12-18 DIAGNOSIS — M170 Bilateral primary osteoarthritis of knee: Secondary | ICD-10-CM

## 2022-12-18 DIAGNOSIS — M7662 Achilles tendinitis, left leg: Secondary | ICD-10-CM

## 2022-12-18 DIAGNOSIS — Z87891 Personal history of nicotine dependence: Secondary | ICD-10-CM

## 2022-12-18 MED ORDER — FUROSEMIDE 20 MG PO TABS: 20.0000 mg | ORAL_TABLET | Freq: Every day | ORAL | 0 refills | Status: AC | PRN

## 2022-12-18 NOTE — Patient Instructions (Signed)
Standing Labs We placed an order today for your standing lab work.   Please have your standing labs drawn at end of August/early September and every 3 months   Please have your labs drawn 2 weeks prior to your appointment so that the provider can discuss your lab results at your appointment, if possible.  Please note that you may see your imaging and lab results in MyChart before we have reviewed them. We will contact you once all results are reviewed. Please allow our office up to 72 hours to thoroughly review all of the results before contacting the office for clarification of your results.  WALK-IN LAB HOURS  Monday through Thursday from 8:00 am -12:30 pm and 1:00 pm-5:00 pm and Friday from 8:00 am-12:00 pm.  Patients with office visits requiring labs will be seen before walk-in labs.  You may encounter longer than normal wait times. Please allow additional time. Wait times may be shorter on  Monday and Thursday afternoons.  We do not book appointments for walk-in labs. We appreciate your patience and understanding with our staff.   Labs are drawn by [facility]. Please bring your co-pay at the time of your lab draw.  You may receive a bill from [facility] for your lab work.  Please note if you are on Hydroxychloroquine and and an order has been placed for a Hydroxychloroquine level,  you will need to have it drawn 4 hours or more after your last dose.  If you wish to have your labs drawn at another location, please call the office 24 hours in advance so we can fax the orders.  The office is located at [address], [place], [place] [address]   If you have any questions regarding directions or hours of operation,  please call [phone].   As a reminder, please drink plenty of water prior to coming for your lab work. Thanks!

## 2022-12-18 NOTE — Telephone Encounter (Signed)
Inbound call from patient , stated that the medication Mesalamine its too expensive for her and she would need a different one .Please advise

## 2022-12-18 NOTE — Telephone Encounter (Signed)
Pt still has not been able to get her canasa supp. Can she please have PA done asap?

## 2022-12-19 ENCOUNTER — Ambulatory Visit (HOSPITAL_BASED_OUTPATIENT_CLINIC_OR_DEPARTMENT_OTHER)
Admission: RE | Admit: 2022-12-19 | Discharge: 2022-12-19 | Disposition: A | Discharge status: Home / Self Care | Payer: Medicare HMO | Source: Ambulatory Visit | Attending: Physician Assistant | Admitting: Physician Assistant

## 2022-12-19 DIAGNOSIS — I700 Atherosclerosis of aorta: Secondary | ICD-10-CM | POA: Diagnosis not present

## 2022-12-19 DIAGNOSIS — R1032 Left lower quadrant pain: Secondary | ICD-10-CM | POA: Insufficient documentation

## 2022-12-19 DIAGNOSIS — K760 Fatty (change of) liver, not elsewhere classified: Secondary | ICD-10-CM | POA: Diagnosis not present

## 2022-12-19 MED ORDER — MESALAMINE 4 G RE ENEM: 4.0000 g | ENEMA | Freq: Every day | RECTAL | 2 refills | Status: DC

## 2022-12-19 MED ORDER — IOHEXOL 300 MG/ML  SOLN
100.0000 mL | Freq: Once | INTRAMUSCULAR | Status: AC | PRN
  Administered 2022-12-19: 85 mL via INTRAVENOUS

## 2022-12-19 NOTE — Telephone Encounter (Signed)
Will send in the enemas, can do prednisone taper after results from CT scan.

## 2022-12-20 ENCOUNTER — Other Ambulatory Visit: Payer: Self-pay

## 2022-12-20 ENCOUNTER — Encounter: Payer: Self-pay | Admitting: Gastroenterology

## 2022-12-20 DIAGNOSIS — K5100 Ulcerative (chronic) pancolitis without complications: Secondary | ICD-10-CM

## 2022-12-21 MED ORDER — PREDNISONE 10 MG PO TABS: ORAL_TABLET | ORAL | 0 refills | Status: AC

## 2022-12-21 NOTE — Telephone Encounter (Signed)
See additional note from patient from 06/06. Nothing further needed

## 2022-12-27 ENCOUNTER — Other Ambulatory Visit: Payer: Self-pay | Admitting: Rheumatology

## 2022-12-27 NOTE — Telephone Encounter (Signed)
Last Fill: 10/19/2022  Labs: 12/07/2022  Next Visit: 03/20/2023  Last Visit: 12/18/2022  DX: Psoriatic arthritis   Current Dose per office note 12/18/2022: methotrexate 0.6 ml sq once weekly   Okay to refill Methotrexate?

## 2022-12-27 NOTE — Telephone Encounter (Signed)
  After discussing with Dr. Lavon Paganini she would like to schedule next available colonoscopy with her in order to evaluate for infection or other complications before increasing Xeljanz or switching to Rinvoq. Can we please set that up at [facility]?

## 2022-12-27 NOTE — Telephone Encounter (Signed)
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  Thanks, Marchelle Folks

## 2022-12-28 ENCOUNTER — Other Ambulatory Visit: Payer: Self-pay

## 2022-12-28 ENCOUNTER — Encounter: Payer: Self-pay | Admitting: Gastroenterology

## 2022-12-28 DIAGNOSIS — K5100 Ulcerative (chronic) pancolitis without complications: Secondary | ICD-10-CM

## 2022-12-28 DIAGNOSIS — E039 Hypothyroidism, unspecified: Secondary | ICD-10-CM | POA: Diagnosis not present

## 2022-12-28 MED ORDER — NA SULFATE-K SULFATE-MG SULF 17.5-3.13-1.6 GM/177ML PO SOLN: ORAL | 0 refills | Status: DC

## 2023-01-02 ENCOUNTER — Ambulatory Visit (HOSPITAL_COMMUNITY): Payer: Medicare HMO

## 2023-01-02 DIAGNOSIS — R300 Dysuria: Secondary | ICD-10-CM | POA: Diagnosis not present

## 2023-01-05 ENCOUNTER — Ambulatory Visit (AMBULATORY_SURGERY_CENTER): Payer: Medicare HMO | Admitting: Gastroenterology

## 2023-01-05 ENCOUNTER — Other Ambulatory Visit (HOSPITAL_COMMUNITY): Payer: Self-pay

## 2023-01-05 ENCOUNTER — Encounter: Payer: Self-pay | Admitting: Gastroenterology

## 2023-01-05 ENCOUNTER — Other Ambulatory Visit: Payer: Self-pay | Admitting: Gastroenterology

## 2023-01-05 VITALS — BP 129/75 | HR 84 | Temp 97.8°F | Resp 17 | Ht 66.0 in | Wt 223.0 lb

## 2023-01-05 DIAGNOSIS — K529 Noninfective gastroenteritis and colitis, unspecified: Secondary | ICD-10-CM

## 2023-01-05 DIAGNOSIS — J45909 Unspecified asthma, uncomplicated: Secondary | ICD-10-CM | POA: Diagnosis not present

## 2023-01-05 DIAGNOSIS — K5150 Left sided colitis without complications: Secondary | ICD-10-CM | POA: Diagnosis not present

## 2023-01-05 DIAGNOSIS — K5100 Ulcerative (chronic) pancolitis without complications: Secondary | ICD-10-CM

## 2023-01-05 DIAGNOSIS — F419 Anxiety disorder, unspecified: Secondary | ICD-10-CM | POA: Diagnosis not present

## 2023-01-05 DIAGNOSIS — J449 Chronic obstructive pulmonary disease, unspecified: Secondary | ICD-10-CM | POA: Diagnosis not present

## 2023-01-05 DIAGNOSIS — E039 Hypothyroidism, unspecified: Secondary | ICD-10-CM | POA: Diagnosis not present

## 2023-01-05 MED ORDER — BUDESONIDE 3 MG PO CPEP
ORAL_CAPSULE | ORAL | 0 refills | Status: AC
Start: 2023-01-05 — End: 2023-02-02

## 2023-01-05 MED ORDER — SODIUM CHLORIDE 0.9 % IV SOLN
500.0000 mL | Freq: Once | INTRAVENOUS | Status: DC
Start: 2023-01-05 — End: 2023-01-05

## 2023-01-05 MED ORDER — MESALAMINE ER 0.375 G PO CP24: 1500.0000 mg | ORAL_CAPSULE | Freq: Every day | ORAL | 1 refills | Status: DC

## 2023-01-05 NOTE — Patient Instructions (Signed)
Resume previous diet and medications. Awaiting pathology results. Repeat Colonoscopy in 3-5 years for surveillance based on pathology. Take Budesonide 9 mg daily x 1 week followed by 6 mg x 1 week and then 3 mg x 2 weeks. Based on [facility] results, will request that insurance approve to increase dose of Xeljanz to 10 mg BID if has significant inflammation. Follow up in the office with APP in 3-4 weeks  YOU HAD AN ENDOSCOPIC PROCEDURE TODAY AT THE [facility]:   Refer to the procedure report that was given to you for any specific questions about what was found during the examination.  If the procedure report does not answer your questions, please call your gastroenterologist to clarify.  If you requested that your care partner not be given the details of your procedure findings, then the procedure report has been included in a sealed envelope for you to review at your convenience later.  YOU SHOULD EXPECT: Some feelings of bloating in the abdomen. Passage of more gas than usual.  Walking can help get rid of the air that was put into your GI tract during the procedure and reduce the bloating. If you had a lower endoscopy (such as a colonoscopy or flexible sigmoidoscopy) you may notice spotting of blood in your stool or on the toilet paper. If you underwent a bowel prep for your procedure, you may not have a normal bowel movement for a few days.  Please Note:  You might notice some irritation and congestion in your nose or some drainage.  This is from the oxygen used during your procedure.  There is no need for concern and it should clear up in a day or so.  SYMPTOMS TO REPORT IMMEDIATELY:  Following lower endoscopy (colonoscopy or flexible sigmoidoscopy):  Excessive amounts of blood in the stool  Significant tenderness or worsening of abdominal pains  Swelling of the abdomen that is new, acute  Fever of 100F or higher  For urgent or emergent issues, a gastroenterologist can be reached at  any hour by calling [phone]. Do not use MyChart messaging for urgent concerns.    DIET:  We do recommend a small meal at first, but then you may proceed to your regular diet.  Drink plenty of fluids but you should avoid alcoholic beverages for 24 hours.  ACTIVITY:  You should plan to take it easy for the rest of today and you should NOT DRIVE or use heavy machinery until tomorrow (because of the sedation medicines used during the test).    FOLLOW UP: Our staff will call the number listed on your records the next business day following your procedure.  We will call around 7:15- 8:00 am to check on you and address any questions or concerns that you may have regarding the information given to you following your procedure. If we do not reach you, we will leave a message.     If any biopsies were taken you will be contacted by phone or by letter within the next 1-3 weeks.  Please call us at [phone] if you have not heard about the biopsies in 3 weeks.    SIGNATURES/CONFIDENTIALITY: You and/or your care partner have signed paperwork which will be entered into your electronic medical record.  These signatures attest to the fact that that the information above on your After Visit Summary has been reviewed and is understood.  Full responsibility of the confidentiality of this discharge information lies with you and/or your care-partner.

## 2023-01-05 NOTE — Progress Notes (Signed)
To pacu, VSS. Report to [email or web address] 

## 2023-01-05 NOTE — Op Note (Addendum)
[facility] Patient Name: [name] Procedure Date: 01/05/2023 8:37 AM MRN: [id number] Endoscopist: Napoleon Form , MD, [id number] Age: 57 Referring MD:  Date of Birth: 07-12-1966 Gender: Female Account #: [id number] Procedure:                Colonoscopy Indications:              High risk colon cancer surveillance: Ulcerative                            pancolitis of 8 (or more) years duration,                            Incidental - Determine extent and severity of                            inflammatory bowel disease Medicines:                Monitored Anesthesia Care Procedure:                Pre-Anesthesia Assessment:                           - Prior to the procedure, a History and Physical                            was performed, and patient medications and                            allergies were reviewed. The patient's tolerance of                            previous anesthesia was also reviewed. The risks                            and benefits of the procedure and the sedation                            options and risks were discussed with the patient.                            All questions were answered, and informed consent                            was obtained. Prior Anticoagulants: The patient has                            taken no anticoagulant or antiplatelet agents. ASA                            Grade Assessment: III - A patient with severe                            systemic disease. After reviewing the risks and  benefits, the patient was deemed in satisfactory                            condition to undergo the procedure.                           After obtaining informed consent, the colonoscope                            was passed under direct vision. Throughout the                            procedure, the patient's blood pressure, pulse, and                            oxygen saturations were monitored  continuously. The                            Olympus PCF-H190DL (#[id number]) Colonoscope was                            introduced through the anus and advanced to the the                            terminal ileum, with identification of the                            appendiceal orifice and IC valve. The colonoscopy                            was performed without difficulty. The patient                            tolerated the procedure well. The quality of the                            bowel preparation was good. The terminal ileum,                            ileocecal valve, appendiceal orifice, and rectum                            were photographed. Scope In: 9:01:44 AM Scope Out: 9:16:36 AM Scope Withdrawal Time: 0 hours 12 minutes 24 seconds  Total Procedure Duration: 0 hours 14 minutes 52 seconds  Findings:                 The perianal and digital rectal examinations were                            normal.                           The terminal ileum appeared normal.  Inflammation was found in a continuous and                            circumferential pattern from the rectum to the                            descending colon. This was graded as Mayo Score 1                            (mild, with erythema, decreased vascular pattern,                            mild friability). Biopsies were taken with a cold                            forceps for histology.                           Scattered small-mouthed diverticula were found in                            the sigmoid colon.                           Non-bleeding internal hemorrhoids were found during                            retroflexion. The hemorrhoids were small. Complications:            No immediate complications. Estimated Blood Loss:     Estimated blood loss was minimal. Impression:               - The examined portion of the ileum was normal.                           - Mild (Mayo Score 1)  left-sided ulcerative                            colitis. Biopsied.                           - Diverticulosis in the sigmoid colon.                           - Non-bleeding internal hemorrhoids. Recommendation:           - Patient has a contact number available for                            emergencies. The signs and symptoms of potential                            delayed complications were discussed with the                            patient. Return to normal activities tomorrow.  Written discharge instructions were provided to the                            patient.                           - Resume previous diet.                           - Continue present medications.                           - Await pathology results.                           - Repeat colonoscopy in 3 - 5 years for                            surveillance based on pathology results.                           - Budesonide 9mg  daily X 1 week followed by 6mg  1                            week and then 3 mg for 2 weeks                           - Start Apriso 4 tablets daily. (She tolerated                            Mesalamine enema with no allergic reaction)                           - Based on path results, will request insurnace                            approval to increase dose of Xeljanz to 10mg  BID if                            has significant inflammation on biopsies and                            continues to have persistent symptoms of UC flare                           - Follow up in office visit with APP in 3-4 weeks Napoleon Form, MD 01/05/2023 9:25:45 AM This report has been signed electronically.

## 2023-01-05 NOTE — Progress Notes (Signed)
Called to room to assist during endoscopic procedure.  Patient ID and intended procedure confirmed with present staff. Received instructions for my participation in the procedure from the performing physician.  

## 2023-01-05 NOTE — Progress Notes (Signed)
[facility] History and Physical   Primary Care Physician:  Lupita Raider, MD   Reason for Procedure:  Ulcerative colitis  Plan:    colonoscopy with possible interventions as needed     HPI: [name] is a very pleasant 57 y.o. female here for surveillance colonoscopy . Determine extent and disease activity with recent exacerbation of UC symptoms   The risks and benefits as well as alternatives of endoscopic procedure(s) have been discussed and reviewed. All questions answered. The patient agrees to proceed.    Past Medical History:  Diagnosis Date   Allergy    Anxiety    Arthritis    Asthma    Back pain    Cataract    both eyes  surgically repaired   COPD (chronic obstructive pulmonary disease) (HCC)    Depression    treated   Dyspnea    Fibrocystic breast disease    GERD (gastroesophageal reflux disease)    History of hiatal hernia    repaired   History of kidney stones    questionable   Hyperlipidemia    Hypertension    pt denies   Hypothyroidism    Irritable bowel syndrome    Joint pain    Osteoarthritis    Ovarian cyst    Palpitations    Pancreatitis    PONV (postoperative nausea and vomiting)    NO TROUBLE WITH CATARACT SURGERY   Psoriasis    Psoriatic arthritis (HCC) 05/12/2016   Poor response to Humira Inadequate response to Enbrel Inadequate response to Simponi   Shortness of breath    Tachycardia    Ulcerative colitis     Past Surgical History:  Procedure Laterality Date   ABDOMINAL HYSTERECTOMY     ANKLE RECONSTRUCTION     left   bladder tuck     CARPAL TUNNEL RELEASE     CATARACT EXTRACTION W/PHACO Right 09/11/2017   Procedure: CATARACT EXTRACTION PHACO AND INTRAOCULAR LENS PLACEMENT (IOC);  Surgeon: Galen Manila, MD;  Location: [facility];  Service: Ophthalmology;  Laterality: Right;  [place] 00:18.5 AP% 6.9 CDE 1.29 Fluid Pack Lot # [id number]   CATARACT EXTRACTION W/PHACO Left 10/02/2017   Procedure: CATARACT  EXTRACTION PHACO AND INTRAOCULAR LENS PLACEMENT (IOC);  Surgeon: Galen Manila, MD;  Location: [facility];  Service: Ophthalmology;  Laterality: Left;  [place] 00:25.9 AP% 6.9 CDE 1.80 Fluid Pack Lot # [id number]   CLOSED REDUCTION NASAL FRACTURE N/A 05/21/2018   Procedure: CLOSED REDUCTION NASAL FRACTURE;  Surgeon: Geanie Logan, MD;  Location: [facility];  Service: ENT;  Laterality: N/A;  Latex sensitivity   COLONOSCOPY     EYE SURGERY     Lasik   FRACTURE SURGERY     KNEE SURGERY     left   LAPAROSCOPIC NISSEN FUNDOPLICATION N/A 01/12/2017   Procedure: LAPAROSCOPIC NISSEN TAKEDOWN AND UPPER ENDOSCOPY;  Surgeon: Luretha Negash, MD;  Location: [facility];  Service: General;  Laterality: N/A;   LEFT HEART CATH AND CORONARY ANGIOGRAPHY N/A 05/10/2020   Procedure: LEFT HEART CATH AND CORONARY ANGIOGRAPHY;  Surgeon: Lyn Records, MD;  Location: [facility];  Service: Cardiovascular;  Laterality: N/A;   NASAL SEPTUM SURGERY     NISSEN FUNDOPLICATION     PARTIAL HYSTERECTOMY     POLYPECTOMY     repair cystocele and rectocele     thyroid ablation     TUBAL LIGATION     UPPER GASTROINTESTINAL ENDOSCOPY     UPPER GI ENDOSCOPY  01/12/2017  Procedure: UPPER GI ENDOSCOPY;  Surgeon: Luretha Gorka, MD;  Location: [facility];  Service: General;;    Prior to Admission medications   Medication Sig Start Date End Date Taking? Authorizing Provider  Cholecalciferol (VITAMIN D3) 50 MCG (2000 UT) CAPS Take 2,000 Units by mouth.   Yes [provider]  estradiol (ESTRACE) 1 MG tablet Take 1 mg by mouth daily.   Yes [provider]  fluticasone (FLONASE) 50 MCG/ACT nasal spray Place 1 spray into both nostrils daily.   Yes [provider]  folic acid (FOLVITE) 1 MG tablet Take 2 mg by mouth daily.   Yes [provider]  levocetirizine (XYZAL) 5 MG tablet Take 5 mg by mouth every evening.   Yes [provider]  levothyroxine (SYNTHROID) 125 MCG tablet  Take 125 mcg by mouth every other day. Alternate with 137 mcg   Yes [provider]  levothyroxine (SYNTHROID) 137 MCG tablet Take 137 mcg by mouth every other day. Alternate with 125 mcg   Yes [provider]  mesalamine (ROWASA) 4 g enema Place 60 mLs (4 g total) rectally at bedtime. Lay on left side, retain as long as able 12/19/22  Yes Doree Albee, PA-C  metoprolol succinate (TOPROL-XL) 50 MG 24 hr tablet Take 25 mg by mouth daily. Take with or immediately following a meal.   Yes [provider]  montelukast (SINGULAIR) 10 MG tablet Take 10 mg by mouth at bedtime.   Yes [provider]  omeprazole (PRILOSEC) 20 MG capsule TAKE 1 CAPSULE EVERY DAY BEFORE BREAKFAST 06/21/22  Yes Ovide Dusek V, MD  rosuvastatin (CRESTOR) 20 MG tablet Take 20 mg by mouth daily.   Yes [provider]  [name] 5 MG TABS Take 1 tablet by mouth twice a day 11/09/22  Yes Gearldine Bienenstock, PA-C  albuterol (PROVENTIL) (2.5 MG/3ML) 0.083% nebulizer solution Take 3 mLs (2.5 mg total) by nebulization every 6 (six) hours as needed for wheezing or shortness of breath. 11/02/22   Leslye Peer, MD  albuterol (VENTOLIN HFA) 108 (90 Base) MCG/ACT inhaler Inhale 2 puffs into the lungs every 6 (six) hours as needed for wheezing or shortness of breath. 11/29/21   Leslye Peer, MD  aspirin-acetaminophen-caffeine (EXCEDRIN MIGRAINE) [phone] MG tablet Take 2 tablets by mouth every 8 (eight) hours as needed for headache.     [provider]  clobetasol ointment (TEMOVATE) 0.05 % Apply 1 application topically 2 (two) times daily. 07/22/21   Gearldine Bienenstock, PA-C  dicyclomine (BENTYL) 20 MG tablet Take 1 tablet (20 mg total) by mouth as needed. 12/07/22   Doree Albee, PA-C  furosemide (LASIX) 20 MG tablet Take 1 tablet (20 mg total) by mouth daily as needed (LOWER LEG EDEMA). 12/18/22   Sharlene Dory, PA-C  ibuprofen (ADVIL,MOTRIN) 200 MG tablet Take 400 mg by mouth every 6  (six) hours as needed for mild pain.     [provider]  methotrexate 50 MG/2ML injection INJECT 0.6ML (15MG ) UNDER THE SKIN ONE TIME WEEKLY. DISCARD VIAL 28 DAYS AFTER FIRST USE. 12/27/22   Pollyann Savoy, MD  nitroGLYCERIN (NITROSTAT) 0.4 MG SL tablet Place 1 tablet (0.4 mg total) under the tongue every 5 (five) minutes as needed for chest pain. 11/18/21   Chandrasekhar, Mahesh A, MD  pantoprazole (PROTONIX) 40 MG tablet Take 1 tablet (40 mg total) by mouth 2 (two) times daily. Patient not taking: Reported on 12/18/2022 11/02/22   Leslye Peer, MD  predniSONE (DELTASONE) 10 MG tablet Take 4 tablets (40 mg total) by mouth daily with breakfast for 7 days, THEN 3 tablets (30 mg total) daily with breakfast for 7 days, THEN 2 tablets (20 mg total) daily with breakfast for 7 days, THEN 1 tablet (10 mg total) daily with breakfast for 7 days, THEN 0.5 tablets (5 mg total) daily with breakfast for 8 days. 12/21/22 01/25/23  Doree Albee, PA-C  Tuberculin-Allergy Syringes 27G X 1/2" 1 ML KIT Inject 1 Syringe into the skin once a week. To be used with weekly methotrexate injections 08/14/16   Pollyann Savoy, MD    Current Outpatient Medications  Medication Sig Dispense Refill   Cholecalciferol (VITAMIN D3) 50 MCG (2000 UT) CAPS Take 2,000 Units by mouth.     estradiol (ESTRACE) 1 MG tablet Take 1 mg by mouth daily.     fluticasone (FLONASE) 50 MCG/ACT nasal spray Place 1 spray into both nostrils daily.     folic acid (FOLVITE) 1 MG tablet Take 2 mg by mouth daily.     levocetirizine (XYZAL) 5 MG tablet Take 5 mg by mouth every evening.     levothyroxine (SYNTHROID) 125 MCG tablet Take 125 mcg by mouth every other day. Alternate with 137 mcg     levothyroxine (SYNTHROID) 137 MCG tablet Take 137 mcg by mouth every other day. Alternate with 125 mcg     mesalamine (ROWASA) 4 g enema Place 60 mLs (4 g total) rectally at bedtime. Lay on left side, retain as long as able 1800 mL 2   metoprolol  succinate (TOPROL-XL) 50 MG 24 hr tablet Take 25 mg by mouth daily. Take with or immediately following a meal.     montelukast (SINGULAIR) 10 MG tablet Take 10 mg by mouth at bedtime.     omeprazole (PRILOSEC) 20 MG capsule TAKE 1 CAPSULE EVERY DAY BEFORE BREAKFAST 90 capsule 3   rosuvastatin (CRESTOR) 20 MG tablet Take 20 mg by mouth daily.     XELJANZ 5 MG TABS Take 1 tablet by mouth twice a day 60 tablet 2   albuterol (PROVENTIL) (2.5 MG/3ML) 0.083% nebulizer solution Take 3 mLs (2.5 mg total) by nebulization every 6 (six) hours as needed for wheezing or shortness of breath. 75 mL 12   albuterol (VENTOLIN HFA) 108 (90 Base) MCG/ACT inhaler Inhale 2 puffs into the lungs every 6 (six) hours as needed for wheezing or shortness of breath. 24 g 3   aspirin-acetaminophen-caffeine (EXCEDRIN MIGRAINE) 250-250-65 MG tablet Take 2 tablets by mouth every 8 (eight) hours as needed for headache.      clobetasol ointment (TEMOVATE) 0.05 % Apply 1 application topically 2 (two) times daily. 60 g 0   dicyclomine (BENTYL) 20 MG tablet Take 1 tablet (20 mg total) by mouth as needed. 90 tablet 1   furosemide (LASIX) 20 MG tablet Take 1 tablet (20 mg total) by mouth daily as needed (LOWER LEG EDEMA). 90 tablet 0   ibuprofen (ADVIL,MOTRIN) 200 MG tablet Take 400 mg by mouth every 6 (six) hours as needed for mild pain.      methotrexate 50 MG/2ML injection INJECT 0.6ML (15MG ) UNDER THE SKIN ONE TIME WEEKLY. DISCARD VIAL 28 DAYS AFTER FIRST USE. 8 mL 0   nitroGLYCERIN (NITROSTAT) 0.4 MG SL tablet Place 1 tablet (0.4 mg total) under the tongue every 5 (five) minutes as needed for chest pain. 25 tablet 3   pantoprazole (PROTONIX) 40 MG tablet Take 1 tablet (40 mg total) by mouth  2 (two) times daily. (Patient not taking: Reported on 12/18/2022) 28 tablet 0   predniSONE (DELTASONE) 10 MG tablet Take 4 tablets (40 mg total) by mouth daily with breakfast for 7 days, THEN 3 tablets (30 mg total) daily with breakfast for 7 days,  THEN 2 tablets (20 mg total) daily with breakfast for 7 days, THEN 1 tablet (10 mg total) daily with breakfast for 7 days, THEN 0.5 tablets (5 mg total) daily with breakfast for 8 days. 74 tablet 0   Tuberculin-Allergy Syringes 27G X 1/2" 1 ML KIT Inject 1 Syringe into the skin once a week. To be used with weekly methotrexate injections 12 each 1   Current Facility-Administered Medications  Medication Dose Route Frequency Provider Last Rate Last Admin   0.9 %  sodium chloride infusion  500 mL Intravenous Once Napoleon Form, MD        Allergies as of 01/05/2023 - Review Complete 01/05/2023  Allergen Reaction Noted   Demerol [meperidine] Hives 10/15/2019   Imuran [azathioprine] Other (See Comments) 08/24/2009   Meperidine hcl Hives 08/24/2009   Remicade [infliximab] Hives and Other (See Comments) 01/26/2011   Sulfasalazine Swelling and Hives 08/04/2011   Avelox [moxifloxacin] Rash 08/24/2009   Doxycycline Rash    Erythromycin Rash 08/24/2009   Latex Rash 08/24/2009    Family History  Problem Relation Age of Onset   Breast cancer Mother 35   Heart disease Mother    Colon polyps Mother    Other Mother        pre-cancerous tumor removed   Colon cancer Mother    Hypertension Mother    Hyperlipidemia Mother    Cancer Mother    Depression Mother    Heart disease Father    Hypertension Father    Hyperlipidemia Father    Stroke Father    Alcohol abuse Father    Neuropathy Father    Crohn's disease Sister    Alcoholism Sister    Breast cancer Paternal Aunt    Breast cancer Maternal Grandmother    Colon polyps Paternal Grandmother    Crohn's disease Daughter    Irritable bowel syndrome Other    Esophageal cancer Neg Hx    Rectal cancer Neg Hx    Stomach cancer Neg Hx    Pancreatic cancer Neg Hx     Social History   Socioeconomic History   Marital status: Married    Spouse name: Not on file   Number of children: Not on file   Years of education: Not on file    Highest education level: Not on file  Occupational History   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, 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    Employer: [employer]   Occupation: disabled  Tobacco Use   Smoking status: Former    Packs/day: 1.00    Years: 37.50    Additional pack years: 0.00    Total pack years: 37.50    Types: Cigarettes    Quit date: 07/27/2009    Years since quitting: 13.4    Passive exposure: Never   Smokeless tobacco: Never  Vaping Use   Vaping Use: Never used  Substance and Sexual Activity   Alcohol use: Yes    Alcohol/week: 0.0 standard drinks of alcohol    Comment:  occasionally (1x/mo)   Drug use: No   Sexual activity: Yes  Other Topics Concern   Not on file  Social History Narrative   Not on file   Social Determinants of Health   Financial Resource  Strain: Not on file  Food Insecurity: Not on file  Transportation Needs: Not on file  Physical Activity: Not on file  Stress: Not on file  Social Connections: Not on file  Intimate Partner Violence: Not on file    Review of Systems:  All other review of systems negative except as mentioned in the HPI.  Physical Exam: Vital signs in last 24 hours: Blood Pressure (Abnormal) 151/95   Pulse 82   Temperature 97.8 F (36.6 C) (Temporal)   Respiration 13   Height 5\' 6"  (1.676 m)   Weight 223 lb (101.2 kg)   Oxygen Saturation 100%   Body Mass Index 35.99 kg/m  General:   Alert, NAD Lungs:  Clear .   Heart:  Regular rate and rhythm Abdomen:  Soft, nontender and nondistended. Neuro/Psych:  Alert and cooperative. Normal mood and affect. A and O x 3  Reviewed labs, radiology imaging, old records and pertinent past GI work up  Patient is appropriate for planned procedure(s) and anesthesia in an ambulatory setting   K. Scherry Ran , MD [phone]

## 2023-01-05 NOTE — Progress Notes (Signed)
VS completed by DT.   I have reviewed the patient's medical history in detail and updated the computerized patient record.  

## 2023-01-09 ENCOUNTER — Telehealth: Payer: Self-pay | Admitting: *Deleted

## 2023-01-09 NOTE — Telephone Encounter (Signed)
  Follow up Call-     01/05/2023    7:52 AM  Call back number  Post procedure Call Back phone  # [phone]  Permission to leave phone message Yes     Patient questions:  Do you have a fever, pain , or abdominal swelling? No. Pain Score  0 *  Have you tolerated food without any problems? Yes.    Have you been able to return to your normal activities? Yes.    Do you have any questions about your discharge instructions: Diet   No. Medications  No. Follow up visit  No.  Do you have questions or concerns about your Care? No.  Actions: * If pain score is 4 or above: No action needed, pain <4.

## 2023-01-11 ENCOUNTER — Ambulatory Visit: Payer: Medicare HMO | Admitting: Nurse Practitioner

## 2023-01-15 ENCOUNTER — Other Ambulatory Visit (HOSPITAL_COMMUNITY): Payer: Self-pay

## 2023-01-15 NOTE — Telephone Encounter (Signed)
PA has been previously denied due to:   Full denial letter has been attached in patients media.

## 2023-01-16 ENCOUNTER — Other Ambulatory Visit: Payer: Self-pay | Admitting: Acute Care

## 2023-01-16 ENCOUNTER — Other Ambulatory Visit: Payer: Self-pay

## 2023-01-16 DIAGNOSIS — Z122 Encounter for screening for malignant neoplasm of respiratory organs: Secondary | ICD-10-CM

## 2023-01-16 DIAGNOSIS — Z87891 Personal history of nicotine dependence: Secondary | ICD-10-CM

## 2023-01-16 NOTE — Telephone Encounter (Signed)
Appeal submitted to [facility] for budesonide.

## 2023-01-17 ENCOUNTER — Other Ambulatory Visit: Payer: Self-pay | Admitting: Physician Assistant

## 2023-01-25 ENCOUNTER — Encounter: Payer: Self-pay | Admitting: Gastroenterology

## 2023-01-25 ENCOUNTER — Ambulatory Visit: Payer: Medicare HMO | Admitting: Gastroenterology

## 2023-02-02 ENCOUNTER — Other Ambulatory Visit: Payer: Self-pay | Admitting: Physician Assistant

## 2023-02-05 NOTE — Telephone Encounter (Signed)
Last Fill: 11/09/2022  Labs: 12/07/2022 Glucose 102  TB Gold: 10/16/2022   Next Visit: 03/20/2023  Last Visit: 12/18/2022  GN:[id number] arthritis   Current Dose per office note 12/18/2022: [name] 5 mg 1 tablet by mouth twice daily   Okay to refill [name]?

## 2023-02-19 ENCOUNTER — Ambulatory Visit (HOSPITAL_COMMUNITY): Payer: Medicare HMO | Attending: Physician Assistant

## 2023-02-19 DIAGNOSIS — R0600 Dyspnea, unspecified: Secondary | ICD-10-CM | POA: Diagnosis not present

## 2023-02-19 LAB — ECHOCARDIOGRAM COMPLETE
Area-P 1/2: 3.98 cm2
S' Lateral: 2.6 cm

## 2023-02-28 ENCOUNTER — Other Ambulatory Visit: Payer: Medicare HMO

## 2023-02-28 ENCOUNTER — Encounter: Payer: Self-pay | Admitting: Gastroenterology

## 2023-02-28 ENCOUNTER — Ambulatory Visit: Payer: Medicare HMO | Admitting: Gastroenterology

## 2023-02-28 VITALS — BP 118/76 | HR 90 | Ht 66.0 in | Wt 225.0 lb

## 2023-02-28 DIAGNOSIS — K5100 Ulcerative (chronic) pancolitis without complications: Secondary | ICD-10-CM

## 2023-02-28 MED ORDER — MESALAMINE ER 0.375 G PO CP24: 1500.0000 mg | ORAL_CAPSULE | Freq: Every day | ORAL | 6 refills | Status: DC

## 2023-02-28 NOTE — Patient Instructions (Signed)
We have sent the following medications to your pharmacy for you to pick up at your convenience: Apriso   .You will need Fecal Calprotectin 2 weeks before your appointment, just come to the lab and get your kit  _______________________________________________________  If your blood pressure at your visit was 140/90 or greater, please contact your primary care physician to follow up on this.  _______________________________________________________  If you are age 6 or older, your body mass index should be between 23-30. Your Body mass index is 36.32 kg/m. If this is out of the aforementioned range listed, please consider follow up with your Primary Care Provider.  If you are age 57 or younger, your body mass index should be between 19-25. Your Body mass index is 36.32 kg/m. If this is out of the aformentioned range listed, please consider follow up with your Primary Care Provider.   ________________________________________________________  The [facility] providers would like to encourage you to use [facility] to communicate with providers for non-urgent requests or questions.  Due to long hold times on the telephone, sending your provider a message by [facility] may be a faster and more efficient way to get a response.  Please allow 48 business hours for a response.  Please remember that this is for non-urgent requests.  _______________________________________________________   Due to recent changes in healthcare laws, you may see the results of your imaging and laboratory studies on MyChart before your provider has had a chance to review them.  We understand that in some cases there may be results that are confusing or concerning to you. Not all laboratory results come back in the same time frame and the provider may be waiting for multiple results in order to interpret others.  Please give [place] 48 hours in order for your provider to thoroughly review all the results before contacting the office for  clarification of your results.    I appreciate the  opportunity to care for you  Thank You   Marsa Aris , MD

## 2023-02-28 NOTE — Progress Notes (Unsigned)
[name]    [id number]    08/01/1965  Primary Care Physician:Shaw, Rockney Ghee, MD  Referring Physician: Lupita Raider, MD [address] [place],  [place] [address]   Chief complaint:  Ulcerative Colitis  HPI:  57 year old very pleasant female with history of chronic ulcerative pancolitis here for follow-up visit after colonoscopy. Colonoscopy January 05, 2023: - The examined portion of the ileum was normal. - Mild ( Mayo Score 1) left- sided ulcerative colitis. Biopsied, showed mild to moderate chronic active colitis. - Diverticulosis in the sigmoid colon. - Non- bleeding internal hemorrhoids.  Overall she is doing better on Apriso 4 capsules daily.  She could not start budesonide, was not covered by insurance, as her symptoms started improving on Apriso and she did not start budesonide Denies any nausea, vomiting, abdominal pain, melena or bright red blood per rectum   She is on methotrexate and Xeljanz for psoriatic arthritis, which were helping with ulcerative colitis" until recent episode of acute flare of colitis.  She is no longer having rectal bleeding, mucus or diarrhea.  On average she is having 2-3 formed bowel movements per day   GI history: Ulcerative colitis diagnosed in 2002, colonoscopy 2011 showed pancolitis.  Colonoscopy December 2014 showed chronic active colitis from 0 to 30 cm and normal transverse and right colon. Colonoscopy Nov 14, 2017 negative for active colitis   Colonoscopy 02/23/20 - The perianal and digital rectal examinations were normal. - A 5 mm polyp [tubular adenoma] was found in the transverse colon. The polyp was sessile. The polyp was removed with a cold snare. Resection and retrieval were complete. - A localized area of mildly nodular mucosa was found in the rectum. Biopsies were taken with a cold forceps for histology. - Non-bleeding internal hemorrhoids were found during retroflexion. The hemorrhoids  were medium-sized. - The exam was otherwise without abnormality.   Chronic GERD status post Nissen fundoplication EGD February 2011 negative for Barrett's esophagus   Outpatient Encounter Medications as of 02/28/2023  Medication Sig   albuterol (PROVENTIL) (2.5 MG/3ML) 0.083% nebulizer solution Take 3 mLs (2.5 mg total) by nebulization every 6 (six) hours as needed for wheezing or shortness of breath.   albuterol (VENTOLIN HFA) 108 (90 Base) MCG/ACT inhaler Inhale 2 puffs into the lungs every 6 (six) hours as needed for wheezing or shortness of breath.   aspirin-acetaminophen-caffeine (EXCEDRIN MIGRAINE) 250-250-65 MG tablet Take 2 tablets by mouth every 8 (eight) hours as needed for headache.    Cholecalciferol (VITAMIN D3) 50 MCG (2000 UT) CAPS Take 2,000 Units by mouth.   clobetasol ointment (TEMOVATE) 0.05 % Apply 1 application topically 2 (two) times daily.   dicyclomine (BENTYL) 20 MG tablet Take 1 tablet (20 mg total) by mouth as needed.   estradiol (ESTRACE) 1 MG tablet Take 1 mg by mouth daily.   fluticasone (FLONASE) 50 MCG/ACT nasal spray Place 1 spray into both nostrils daily.   folic acid (FOLVITE) 1 MG tablet Take 2 mg by mouth daily.   furosemide (LASIX) 20 MG tablet Take 1 tablet (20 mg total) by mouth daily as needed (LOWER LEG EDEMA).   ibuprofen (ADVIL,MOTRIN) 200 MG tablet Take 400 mg by mouth every 6 (six) hours as needed for mild pain.    levocetirizine (XYZAL) 5 MG tablet Take 5 mg by mouth every evening.   levothyroxine (SYNTHROID) 125 MCG tablet Take 125 mcg by mouth every other day. Alternate with 137  mcg   levothyroxine (SYNTHROID) 137 MCG tablet Take 137 mcg by mouth every other day. Alternate with 125 mcg   mesalamine (APRISO) 0.375 g 24 hr capsule Take 4 capsules (1.5 g total) by mouth daily.   mesalamine (ROWASA) 4 g enema Place 60 mLs (4 g total) rectally at bedtime. Lay on left side, retain as long as able   methotrexate 50 MG/2ML injection INJECT 0.6ML  (15MG ) UNDER THE SKIN ONE TIME WEEKLY. DISCARD VIAL 28 DAYS AFTER FIRST USE.   metoprolol succinate (TOPROL-XL) 50 MG 24 hr tablet Take 25 mg by mouth daily. Take with or immediately following a meal.   montelukast (SINGULAIR) 10 MG tablet Take 10 mg by mouth at bedtime.   omeprazole (PRILOSEC) 20 MG capsule TAKE 1 CAPSULE EVERY DAY BEFORE BREAKFAST   pantoprazole (PROTONIX) 40 MG tablet Take 1 tablet (40 mg total) by mouth 2 (two) times daily. (Patient not taking: Reported on 12/18/2022)   rosuvastatin (CRESTOR) 20 MG tablet Take 20 mg by mouth daily.   Tuberculin-Allergy Syringes 27G X 1/2" 1 ML KIT Inject 1 Syringe into the skin once a week. To be used with weekly methotrexate injections   XELJANZ 5 MG TABS Take 1 tablet by mouth twice a day   No facility-administered encounter medications on file as of 02/28/2023.    Allergies as of 02/28/2023 - Review Complete 02/28/2023  Allergen Reaction Noted   Demerol [meperidine] Hives 10/15/2019   Imuran [azathioprine] Other (See Comments) 08/24/2009   Meperidine hcl Hives 08/24/2009   Remicade [infliximab] Hives and Other (See Comments) 01/26/2011   Sulfasalazine Swelling and Hives 08/04/2011   Avelox [moxifloxacin] Rash 08/24/2009   Doxycycline Rash    Erythromycin Rash 08/24/2009   Latex Rash 08/24/2009    Past Medical History:  Diagnosis Date   Allergy    Anxiety    Arthritis    Asthma    Back pain    Cataract    both eyes  surgically repaired   COPD (chronic obstructive pulmonary disease) (HCC)    Depression    treated   Dyspnea    Fibrocystic breast disease    GERD (gastroesophageal reflux disease)    History of hiatal hernia    repaired   History of kidney stones    questionable   Hyperlipidemia    Hypertension    pt denies   Hypothyroidism    Irritable bowel syndrome    Joint pain    Osteoarthritis    Ovarian cyst    Palpitations    Pancreatitis    PONV (postoperative nausea and vomiting)    NO TROUBLE WITH  CATARACT SURGERY   Psoriasis    Psoriatic arthritis (HCC) 05/12/2016   Poor response to Humira Inadequate response to Enbrel Inadequate response to Simponi   Shortness of breath    Tachycardia    Ulcerative colitis     Past Surgical History:  Procedure Laterality Date   ABDOMINAL HYSTERECTOMY     ANKLE RECONSTRUCTION     left   bladder tuck     CARPAL TUNNEL RELEASE     CATARACT EXTRACTION W/PHACO Right 09/11/2017   Procedure: CATARACT EXTRACTION PHACO AND INTRAOCULAR LENS PLACEMENT (IOC);  Surgeon: Galen Manila, MD;  Location: [facility];  Service: Ophthalmology;  Laterality: Right;  [place] 00:18.5 AP% 6.9 CDE 1.29 Fluid Pack Lot # [id number]   CATARACT EXTRACTION W/PHACO Left 10/02/2017   Procedure: CATARACT EXTRACTION PHACO AND INTRAOCULAR LENS PLACEMENT (IOC);  Surgeon: Galen Manila, MD;  Location:  [facility];  Service: Ophthalmology;  Laterality: Left;  [place] 00:25.9 AP% 6.9 CDE 1.80 Fluid Pack Lot # [id number]   CLOSED REDUCTION NASAL FRACTURE N/A 05/21/2018   Procedure: CLOSED REDUCTION NASAL FRACTURE;  Surgeon: Geanie Logan, MD;  Location: [facility];  Service: ENT;  Laterality: N/A;  Latex sensitivity   COLONOSCOPY     EYE SURGERY     Lasik   FRACTURE SURGERY     KNEE SURGERY     left   LAPAROSCOPIC NISSEN FUNDOPLICATION N/A 01/12/2017   Procedure: LAPAROSCOPIC NISSEN TAKEDOWN AND UPPER ENDOSCOPY;  Surgeon: Luretha Lawler, MD;  Location: [facility];  Service: General;  Laterality: N/A;   LEFT HEART CATH AND CORONARY ANGIOGRAPHY N/A 05/10/2020   Procedure: LEFT HEART CATH AND CORONARY ANGIOGRAPHY;  Surgeon: Lyn Records, MD;  Location: [facility];  Service: Cardiovascular;  Laterality: N/A;   NASAL SEPTUM SURGERY     NISSEN FUNDOPLICATION     PARTIAL HYSTERECTOMY     POLYPECTOMY     repair cystocele and rectocele     thyroid ablation     TUBAL LIGATION     UPPER GASTROINTESTINAL ENDOSCOPY     UPPER GI ENDOSCOPY  01/12/2017   Procedure: UPPER GI  ENDOSCOPY;  Surgeon: Luretha Klahr, MD;  Location: [facility];  Service: General;;    Family History  Problem Relation Age of Onset   Breast cancer Mother 39   Heart disease Mother    Colon polyps Mother    Other Mother        pre-cancerous tumor removed   Colon cancer Mother    Hypertension Mother    Hyperlipidemia Mother    Cancer Mother    Depression Mother    Heart disease Father    Hypertension Father    Hyperlipidemia Father    Stroke Father    Alcohol abuse Father    Neuropathy Father    Crohn's disease Sister    Alcoholism Sister    Breast cancer Paternal Aunt    Breast cancer Maternal Grandmother    Colon polyps Paternal Grandmother    Crohn's disease Daughter    Irritable bowel syndrome Other    Esophageal cancer Neg Hx    Rectal cancer Neg Hx    Stomach cancer Neg Hx    Pancreatic cancer Neg Hx     Social History   Socioeconomic History   Marital status: Married    Spouse name: Not on file   Number of children: Not on file   Years of education: Not on file   Highest education level: Not on file  Occupational History   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, 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    Employer: [employer]   Occupation: disabled  Tobacco Use   Smoking status: Former    Current packs/day: 0.00    Average packs/day: 1 pack/day for 37.5 years (37.5 ttl pk-yrs)    Types: Cigarettes    Start date: 01/26/1972    Quit date: 07/27/2009    Years since quitting: 13.6    Passive exposure: Never   Smokeless tobacco: Never  Vaping Use   Vaping status: Never Used  Substance and Sexual Activity   Alcohol use: Yes    Alcohol/week: 0.0 standard drinks of alcohol    Comment:  occasionally (1x/mo)   Drug use: No   Sexual activity: Yes  Other Topics Concern   Not on file  Social History Narrative   Not on file   Social Determinants of Health  Financial Resource Strain: Not on file  Food Insecurity: Not on file  Transportation Needs: Not on file  Physical Activity: Not on  file  Stress: Not on file  Social Connections: Not on file  Intimate Partner Violence: Not on file      Review of systems: All other review of systems negative except as mentioned in the HPI.   Physical Exam: Vitals:   02/28/23 1122  BP: 118/76  Pulse: 90   Body mass index is 36.32 kg/m. Gen:      No acute distress HEENT:  sclera anicteric Abd:      soft, non-tender; no palpable masses, no distension Ext:    No edema Neuro: alert and oriented x 3 Psych: normal mood and affect  Data Reviewed:  Reviewed labs, radiology imaging, old records and pertinent past GI work up   Assessment and Plan/Recommendations:  57 year old female with history of psoriasis, psoriatic arthritis, pancolonic ulcerative colitis on [name] and weekly methotrexate She is doing well overall Will follow-up fecal calprotectin in December to document clinical remission Continue current regimen along with multivitamin, B12 and folic acid She is upto date with IBD health maintenance      Use dicyclomine as needed for IBS symptoms and abdominal cramping   GERD: Continue omeprazole and antireflux measures   Return in 3 to 4 months or sooner if needed  This visit required 40 minutes of patient care (this includes precharting, chart review, review of results, face-to-face time used for counseling as well as treatment plan and follow-up. The patient was provided an opportunity to ask questions and all were answered. The patient agreed with the plan and demonstrated an understanding of the instructions.  Iona Beard , MD    CC: Lupita Raider, MD

## 2023-03-01 ENCOUNTER — Encounter: Payer: Self-pay | Admitting: Gastroenterology

## 2023-03-06 NOTE — Progress Notes (Signed)
Office Visit Note  Patient: [name]             Date of Birth: 09/24/1965           MRN: [id number]             PCP: Lupita Raider, MD Referring: Lupita Raider, MD Visit Date: 03/20/2023 Occupation: @GUAROCC @  Subjective:  Discuss medications   History of Present Illness: [name] is a 57 y.o. female with history of psoriatic arthritis and osteoarthritis.  Patient remains on Xeljanz 5 mg 1 tablet by mouth twice daily, methotrexate 0.6 ml sq once weekly, folic acid 2 mg po daily.  She is tolerating combination therapy without any side effects and has not missed any doses recently.  She denies any recent or recurrent infections.  Patient experiences intermittent stiffness and discomfort in both hands and both ankle joints.  She denies any Achilles tendinitis or plantar fasciitis.  She is not experiencing any joint swelling at this time.  She continues to have chronic pain in the left SI joint.  She continues to have recurrent flares of possible psoriasis on the plantar aspect of both feet.  She has no pustular psoriasis on her palms at this time.  She is no longer following up with dermatology. Patient has remained under the close care of gastroenterology.  She was restarted on mesalamine after undergoing a colonoscopy on 01/05/2023.  She denies any blood in her stool at this time.  Her symptoms have been stable.  She was apprehensive to increase the dose of xeljanz due to possible side effects.  She will be following up with GI in December and will be having a repeat fecal calprotectin test at that time.    Activities of Daily Living:  Patient reports morning stiffness for 30 minutes.   Patient Denies nocturnal pain.  Difficulty dressing/grooming: Denies Difficulty climbing stairs: Reports Difficulty getting out of chair: Denies Difficulty using hands for taps, buttons, cutlery, and/or writing: Reports  Review of Systems  Constitutional:  Positive for fatigue.  HENT:   Negative for mouth sores and mouth dryness.   Eyes:  Negative for dryness.  Respiratory:  Positive for shortness of breath.        With exertion   Cardiovascular:  Negative for chest pain and palpitations.  Gastrointestinal:  Positive for diarrhea. Negative for blood in stool and constipation.  Endocrine: Negative for increased urination.  Genitourinary:  Negative for involuntary urination.  Musculoskeletal:  Positive for joint pain, joint pain, joint swelling, myalgias, morning stiffness and myalgias. Negative for gait problem, muscle weakness and muscle tenderness.  Skin:  Positive for color change and rash. Negative for hair loss and sensitivity to sunlight.  Allergic/Immunologic: Negative for susceptible to infections.  Neurological:  Negative for dizziness and headaches.  Hematological:  Negative for swollen glands.  Psychiatric/Behavioral:  Negative for depressed mood and sleep disturbance. The patient is not nervous/anxious.     PMFS History:  Patient Active Problem List   Diagnosis Date Noted   Upper airway resistance syndrome 11/02/2022   Coronary artery disease involving native coronary artery of native heart without angina pectoris 06/14/2020   Aortic atherosclerosis (HCC) 05/06/2020   Family history of bicuspid aortic valve 05/06/2020   Dyspnea on exertion 03/31/2020   Vitamin D deficiency 12/24/2019   Depression 12/24/2019   History of tobacco use 08/06/2019   Allergic rhinitis 04/19/2018   Obstructive sleep apnea syndrome, mild 04/19/2018   COPD (chronic obstructive pulmonary disease) (HCC)  03/26/2018   Rectal bleeding 01/22/2018   LLQ abdominal pain 01/22/2018   Dysphagia 01/12/2017   Spondylosis of lumbar region without myelopathy or radiculopathy 08/11/2016   Primary osteoarthritis of both knees 08/11/2016   History of hypertension 08/11/2016   History of ulcerative colitis 08/11/2016   History of IBS 08/11/2016   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   High risk  medications (not anticoagulants) long-term use 05/13/2016   Knee pain, bilateral 05/13/2016   Anserine bursitis 05/13/2016   Chondromalacia patellae, left knee 05/13/2016   Chondromalacia patellae, right knee 05/13/2016   Psoriatic arthritis (HCC) 05/12/2016   UNSPECIFIED HYPOTHYROIDISM 08/24/2009   Other hyperlipidemia 08/24/2009   Essential hypertension 08/24/2009   GERD 08/24/2009   Ulcerative colitis (HCC) 08/24/2009   IRRITABLE BOWEL SYNDROME 08/24/2009   OVARIAN CYST 08/24/2009   PSORIASIS 08/24/2009   ARTHRITIS 08/24/2009   PANCREATITIS, HX OF 08/24/2009   FIBROCYSTIC BREAST DISEASE, HX OF 08/24/2009    Past Medical History:  Diagnosis Date   Allergy    Anxiety    Arthritis    Asthma    Back pain    Cataract    both eyes  surgically repaired   COPD (chronic obstructive pulmonary disease) (HCC)    Depression    treated   Dyspnea    Fibrocystic breast disease    GERD (gastroesophageal reflux disease)    History of hiatal hernia    repaired   History of kidney stones    questionable   Hyperlipidemia    Hypertension    pt denies   Hypothyroidism    Irritable bowel syndrome    Joint pain    Osteoarthritis    Ovarian cyst    Palpitations    Pancreatitis    PONV (postoperative nausea and vomiting)    NO TROUBLE WITH CATARACT SURGERY   Psoriasis    Psoriatic arthritis (HCC) 05/12/2016   Poor response to Humira Inadequate response to Enbrel Inadequate response to Simponi   Shortness of breath    Tachycardia    Ulcerative colitis     Family History  Problem Relation Age of Onset   Breast cancer Mother 20   Heart disease Mother    Colon polyps Mother    Other Mother        pre-cancerous tumor removed   Colon cancer Mother    Hypertension Mother    Hyperlipidemia Mother    Cancer Mother    Depression Mother    Heart disease Father    Hypertension Father    Hyperlipidemia Father    Stroke Father    Alcohol abuse Father    Neuropathy Father     Crohn's disease Sister    Alcoholism Sister    Breast cancer Paternal Aunt    Breast cancer Maternal Grandmother    Colon polyps Paternal Grandmother    Crohn's disease Daughter    Irritable bowel syndrome Other    Esophageal cancer Neg Hx    Rectal cancer Neg Hx    Stomach cancer Neg Hx    Pancreatic cancer Neg Hx    Past Surgical History:  Procedure Laterality Date   ABDOMINAL HYSTERECTOMY     ANKLE RECONSTRUCTION     left   bladder tuck     CARPAL TUNNEL RELEASE     CATARACT EXTRACTION W/PHACO Right 09/11/2017   Procedure: CATARACT EXTRACTION PHACO AND INTRAOCULAR LENS PLACEMENT (IOC);  Surgeon: Galen Manila, MD;  Location: [facility];  Service: Ophthalmology;  Laterality: Right;  [place] 00:18.5  AP% 6.9 CDE 1.29 Fluid Pack Lot # [id number]   CATARACT EXTRACTION W/PHACO Left 10/02/2017   Procedure: CATARACT EXTRACTION PHACO AND INTRAOCULAR LENS PLACEMENT (IOC);  Surgeon: Galen Manila, MD;  Location: [facility];  Service: Ophthalmology;  Laterality: Left;  [place] 00:25.9 AP% 6.9 CDE 1.80 Fluid Pack Lot # [id number]   CLOSED REDUCTION NASAL FRACTURE N/A 05/21/2018   Procedure: CLOSED REDUCTION NASAL FRACTURE;  Surgeon: Geanie Logan, MD;  Location: [facility];  Service: ENT;  Laterality: N/A;  Latex sensitivity   COLONOSCOPY     EYE SURGERY     Lasik   FRACTURE SURGERY     KNEE SURGERY     left   LAPAROSCOPIC NISSEN FUNDOPLICATION N/A 01/12/2017   Procedure: LAPAROSCOPIC NISSEN TAKEDOWN AND UPPER ENDOSCOPY;  Surgeon: Luretha Barrie, MD;  Location: [facility];  Service: General;  Laterality: N/A;   LEFT HEART CATH AND CORONARY ANGIOGRAPHY N/A 05/10/2020   Procedure: LEFT HEART CATH AND CORONARY ANGIOGRAPHY;  Surgeon: Lyn Records, MD;  Location: [facility];  Service: Cardiovascular;  Laterality: N/A;   NASAL SEPTUM SURGERY     NISSEN FUNDOPLICATION     PARTIAL HYSTERECTOMY     POLYPECTOMY     repair cystocele and rectocele     thyroid ablation     TUBAL  LIGATION     UPPER GASTROINTESTINAL ENDOSCOPY     UPPER GI ENDOSCOPY  01/12/2017   Procedure: UPPER GI ENDOSCOPY;  Surgeon: Luretha Sawin, MD;  Location: [facility];  Service: General;;   Social History   Social History Narrative   Not on file   Immunization History  Administered Date(s) Administered   Influenza Inj Mdck Quad Pf 05/08/2017   Influenza,inj,Quad PF,6+ Mos 03/08/2018, 03/29/2019, 06/25/2019, 03/31/2020, 04/14/2021   Influenza-Unspecified 03/31/2020   PFIZER Comirnaty(Gray Top)Covid-19 Tri-Sucrose Vaccine 09/20/2020   PFIZER(Purple Top)SARS-COV-2 Vaccination 09/28/2019, 10/20/2019, 03/02/2020   Zoster Recombinant(Shingrix) 09/06/2020, 02/28/2021     Objective: Vital Signs: BP 131/84 (BP Location: Left Arm, Patient Position: Sitting, Cuff Size: Normal)   Pulse 69   Resp 15   Ht 5\' 6"  (1.676 m)   Wt 228 lb 9.6 oz (103.7 kg)   BMI 36.90 kg/m    Physical Exam Vitals and nursing note reviewed.  Constitutional:      Appearance: She is well-developed.  HENT:     Head: Normocephalic and atraumatic.  Eyes:     Conjunctiva/sclera: Conjunctivae normal.  Cardiovascular:     Rate and Rhythm: Normal rate and regular rhythm.     Heart sounds: Normal heart sounds.  Pulmonary:     Effort: Pulmonary effort is normal.     Breath sounds: Normal breath sounds.  Abdominal:     General: Bowel sounds are normal.     Palpations: Abdomen is soft.  Musculoskeletal:     Cervical back: Normal range of motion.  Skin:    General: Skin is warm and dry.     Capillary Refill: Capillary refill takes less than 2 seconds.     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  Psychiatric:        Behavior: Behavior normal.      Musculoskeletal Exam: C-spine, thoracic spine, lumbar spine have good range of motion.  Tenderness over the left SI joint.  Shoulder joints, elbow  joints, wrist joints, MCPs, PIPs, DIPs have good range of motion  with no synovitis.  Complete fist formation bilaterally.  Mild tenderness over both wrist.  PIP and DIP thickening noted.  Hip joints have good range of motion with no groin pain.  Knee joints have good range of motion with no warmth or effusion.  Ankle joints have good range of motion with mild tenderness but no synovitis.  No evidence of Achilles tendinitis or plantar fasciitis.  CDAI Exam: CDAI Score: -- Patient Global: --; Provider Global: -- Swollen: 0 ; Tender: 1  Joint Exam 03/20/2023      Right  Left  Sacroiliac      Tender     Investigation: No additional findings.  Imaging: ECHOCARDIOGRAM COMPLETE  Result Date: 02/19/2023    ECHOCARDIOGRAM REPORT   Patient Name:   GENINE MCILVAIN Date of Exam: 02/19/2023 Medical Rec #:  [id number]       Height:       66.0 in Accession #:    [id number]      Weight:       223.0 lb Date of Birth:  05-10-1966       BSA:          2.094 m Patient Age:    57 years        BP:           124/86 mmHg Patient Gender: F               HR:           75 bpm. Exam Location:  [address] Procedure: 2D Echo, Cardiac Doppler and Color Doppler Indications:    R06.00 Dyspnea  History:        Patient has prior history of Echocardiogram examinations, most                 recent 05/27/2020. COPD, Arrythmias:Tachycardia,                 Signs/Symptoms:Dyspnea and Edema; Risk Factors:Family History of                 Coronary Artery Disease, Hypertension, Dyslipidemia and Former                 Smoker.  Sonographer:    Farrel Conners RDCS Referring Phys: Bernadette Hoit CONTE IMPRESSIONS  1. Left ventricular ejection fraction, by estimation, is 60 to 65%. The left ventricle has normal function. The left ventricle has no regional wall motion abnormalities. Left ventricular diastolic parameters are consistent with Grade I diastolic dysfunction (impaired relaxation).  2. Right ventricular systolic function is normal. The right  ventricular size is normal. There is normal pulmonary artery systolic pressure. The estimated right ventricular systolic pressure is 17.0 mmHg.  3. The mitral valve is grossly normal. Trivial mitral valve regurgitation.  4. The aortic valve is tricuspid. Aortic valve regurgitation is not visualized.  5. The inferior vena cava is normal in size with greater than 50% respiratory variability, suggesting right atrial pressure of 3 mmHg. Comparison(s): Changes from prior study are noted. 05/27/2020: LVEF 60-65%, RA pressure 3 mmHg. FINDINGS  Left Ventricle: Left ventricular ejection fraction, by estimation, is 60 to 65%. The left ventricle has normal function. The left ventricle has no regional wall motion abnormalities. The left ventricular internal cavity size was normal in size. There is  no left ventricular hypertrophy. Left ventricular diastolic parameters are consistent with Grade I diastolic dysfunction (impaired relaxation). Indeterminate filling pressures. Right Ventricle: The right ventricular size is normal. No increase in right ventricular  wall thickness. Right ventricular systolic function is normal. There is normal pulmonary artery systolic pressure. The tricuspid regurgitant velocity is 1.87 m/s, and  with an assumed right atrial pressure of 3 mmHg, the estimated right ventricular systolic pressure is 17.0 mmHg. Left Atrium: Left atrial size was normal in size. Right Atrium: Right atrial size was normal in size. Pericardium: There is no evidence of pericardial effusion. Mitral Valve: The mitral valve is grossly normal. Trivial mitral valve regurgitation. Tricuspid Valve: The tricuspid valve is grossly normal. Tricuspid valve regurgitation is trivial. Aortic Valve: The aortic valve is tricuspid. Aortic valve regurgitation is not visualized. Pulmonic Valve: The pulmonic valve was normal in structure. Pulmonic valve regurgitation is not visualized. Aorta: The aortic root and ascending aorta are structurally  normal, with no evidence of dilitation. Venous: The inferior vena cava is normal in size with greater than 50% respiratory variability, suggesting right atrial pressure of 3 mmHg. IAS/Shunts: No atrial level shunt detected by color flow Doppler.  LEFT VENTRICLE PLAX 2D LVIDd:         4.40 cm   Diastology LVIDs:         2.60 cm   LV e' medial:    6.64 cm/s LV PW:         0.80 cm   LV E/e' medial:  10.8 LV IVS:        0.75 cm   LV e' lateral:   12.10 cm/s LVOT diam:     2.40 cm   LV E/e' lateral: 5.9 LV SV:         98 LV SV Index:   47 LVOT Area:     4.52 cm  RIGHT VENTRICLE RV Basal diam:  3.00 cm RV S prime:     11.30 cm/s TAPSE (M-mode): 2.1 cm RVSP:           17.0 mmHg LEFT ATRIUM             Index        RIGHT ATRIUM           Index LA diam:        3.70 cm 1.77 cm/m   RA Pressure: 3.00 mmHg LA Vol (A2C):   39.6 ml 18.91 ml/m  RA Area:     13.70 cm LA Vol (A4C):   34.4 ml 16.42 ml/m  RA Volume:   36.10 ml  17.24 ml/m LA Biplane Vol: 37.6 ml 17.95 ml/m  AORTIC VALVE LVOT Vmax:   112.00 cm/s LVOT Vmean:  71.050 cm/s LVOT VTI:    0.218 m  AORTA Ao Root diam: 2.80 cm Ao Asc diam:  2.90 cm MITRAL VALVE               TRICUSPID VALVE MV Area (PHT): cm         TR Peak grad:   14.0 mmHg MV Decel Time: 191 msec    TR Vmax:        187.00 cm/s MV E velocity: 71.80 cm/s  Estimated RAP:  3.00 mmHg MV A velocity: 65.80 cm/s  RVSP:           17.0 mmHg MV E/A ratio:  1.09                            SHUNTS  Systemic VTI:  0.22 m                            Systemic Diam: 2.40 cm Zoila Shutter MD Electronically signed by Zoila Shutter MD Signature Date/Time: 02/19/2023/12:23:44 PM    Final     Recent Labs: Lab Results  Component Value Date   WBC 8.5 12/07/2022   HGB 13.8 12/07/2022   PLT 281.0 12/07/2022   NA 135 12/07/2022   K 4.2 12/07/2022   CL 96 12/07/2022   CO2 30 12/07/2022   GLUCOSE 102 (H) 12/07/2022   BUN 13 12/07/2022   CREATININE 0.95 12/07/2022   BILITOT 0.6 12/07/2022    ALKPHOS 47 12/07/2022   AST 30 12/07/2022   ALT 24 12/07/2022   PROT 7.4 12/07/2022   ALBUMIN 4.0 12/07/2022   CALCIUM 9.4 12/07/2022   GFRAA 86 08/10/2020   QFTBGOLDPLUS Negative 10/16/2022    Speciality Comments: Inadequate response to Enbrel, Humira, Simponi,  Remicade-allergic response  Procedures:  No procedures performed Allergies: Demerol [meperidine], Imuran [azathioprine], Meperidine hcl, Remicade [infliximab], Sulfasalazine, Avelox [moxifloxacin], Doxycycline, Erythromycin, and Latex       Assessment / Plan:     Visit Diagnoses: Psoriatic arthritis (HCC) - She has no synovitis or dactylitis on examination today.  No evidence of Achilles tendinitis or plantar fasciitis.  She continues to experience intermittent pain and stiffness in both wrists and both ankle joints but no active inflammation was noted today.  She has chronic pain in the left SI joint which is responsive to prednisone use.  She is currently taking Xeljanz 5 mg 1 tablet twice daily and methotrexate 0.6 mL apiece injections once weekly.  She is tolerating combination therapy without any side effects.  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  She uses clobetasol ointment as needed as well as moisturizing creams.  Referral to dermatology will be placed today.   Patient remained under the care of Dr. Lavon Paganini (GI) for management of ulcerative colitis.  She had an updated colonoscopy on 01/05/2023.  Fecal calprotectin was elevated on 12/08/2022.  There was discussion of increasing the dose of [name] but the patient is apprehensive due to possible adverse effects.  Mesalamine was restarted.  She will be having fecal calprotectin rechecked in December at her scheduled follow-up visit.   She will remain on Xeljanz and methotrexate as prescribed.  She was advised to notify us if she develops signs or symptoms of a flare.  She will  follow-up in the office in 4 months or sooner if needed.  Plan: Lipid panel  Association of heart disease with psoriatic arthritis was discussed. Need to monitor blood pressure, cholesterol, and to exercise 30-60 minutes on daily basis was discussed.   Lipid panel updated today.  Counseled on the increased risk of DVT/PE.  Also reviewed rare adverse effects such as bowel injury and the need to contact us if develops stomach pain during treatment.  Reviewed with patient that there is the possibility of an increased risk of malignancy but it is not well understood if this increased risk is due to the medication or the disease state.  Counseled that [name] should be held for infection and prior to surgery.    Psoriasis: Pustular psoraisis noted on plantar aspect of both feet-unchanged. No gaps in therapy.  She uses topical agents and moisturizers without improvement. Plan to refer the patient  to dermatology.   High risk medication use - Xeljanz 5 mg 1 tablet by mouth twice daily, methotrexate 0.6 ml sq once weekly, folic acid 2 mg po daily.  CBC, BMP, and hepatic function panel updated on 12/07/22. Lab work updated today.  Her next lab work will be due in December and every 3 months.  TB gold negative 10/16/22.  Lipid panel updated today. No recent or recurrent infections.  Discussed the importance of holding xeljanz and methotrexate if she develops signs or symptoms of an infection and to resume once the infection has completley cleared.  - Plan: COMPLETE METABOLIC PANEL WITH GFR, CBC with Differential/Platelet, Lipid panel  Raynaud's phenomenon without gangrene: Not currently symptomatic.  No digital ulcers noted.   S/p bilateral carpal tunnel release - bilateral carpal tunnel release by Dr. Mina Marble in May 2023.  No currently symptomatic.   Primary osteoarthritis of both knees: Good ROM of both knee joints.  No warmth or effusion noted.   Achilles tendonitis, bilateral: Mild tenderness of both  ankles.  No evidence of achilles tendonitis or plantar fasciitis.    DDD (degenerative disc disease), lumbar: No midline spinal tenderness in the lumbar region.  History of ulcerative colitis: Reviewed Dr. Elana Alm office visit note from 02/28/23.   Colonoscopy updated on 01/05/23  Mesalamine was added. Fecal calprotectin elevated-254 on 12/08/22.  Plan to recheck calprotectin in December 2024.   Not currently experiencing symptoms of a flare.   She remains on xeljanz and methotrexate as combination therapy.   There was discussion of increasing the dose of [name] but she is apprehensive due to possible adverse effects including increased risk for MACE.   History of hyperlipidemia - Patient remains on crestor as prescribed. Lipid panel updated today.  Plan: Lipid panel  Other medical conditions are listed as follows:   History of gastroesophageal reflux (GERD)  History of IBS  History of hypertension: BP was 131/84 today in the office.   Vitamin D deficiency  Former smoker   Orders: Orders Placed This Encounter  Procedures   COMPLETE METABOLIC PANEL WITH GFR   CBC with Differential/Platelet   Lipid panel   No orders of the defined types were placed in this encounter.  Follow-Up Instructions: Return in about 4 months (around 07/20/2023) for Psoriatic arthritis.   Gearldine Bienenstock, PA-C  Note - This record has been created using Dragon software.  Chart creation errors have been sought, but may not always  have been located. Such creation errors do not reflect on  the standard of medical care.

## 2023-03-07 ENCOUNTER — Other Ambulatory Visit: Payer: Self-pay | Admitting: Rheumatology

## 2023-03-07 NOTE — Telephone Encounter (Signed)
Last Fill: 12/27/2022  Labs: 12/07/2022 Glucose 102  Next Visit: 03/20/2023  Last Visit: 12/18/2022  DX: Psoriatic arthritis   Current Dose per office note 12/27/2022:  methotrexate 0.6 ml sq once weekly   Okay to refill Methotrexate?

## 2023-03-20 ENCOUNTER — Encounter: Payer: Self-pay | Admitting: Physician Assistant

## 2023-03-20 ENCOUNTER — Ambulatory Visit: Payer: Medicare HMO | Attending: Physician Assistant | Admitting: Physician Assistant

## 2023-03-20 VITALS — BP 131/84 | HR 69 | Resp 15 | Ht 66.0 in | Wt 228.6 lb

## 2023-03-20 DIAGNOSIS — M5136 Other intervertebral disc degeneration, lumbar region: Secondary | ICD-10-CM | POA: Diagnosis not present

## 2023-03-20 DIAGNOSIS — Z79899 Other long term (current) drug therapy: Secondary | ICD-10-CM

## 2023-03-20 DIAGNOSIS — Z9889 Other specified postprocedural states: Secondary | ICD-10-CM | POA: Diagnosis not present

## 2023-03-20 DIAGNOSIS — Z8719 Personal history of other diseases of the digestive system: Secondary | ICD-10-CM | POA: Diagnosis not present

## 2023-03-20 DIAGNOSIS — I7300 Raynaud's syndrome without gangrene: Secondary | ICD-10-CM | POA: Diagnosis not present

## 2023-03-20 DIAGNOSIS — M170 Bilateral primary osteoarthritis of knee: Secondary | ICD-10-CM

## 2023-03-20 DIAGNOSIS — M7662 Achilles tendinitis, left leg: Secondary | ICD-10-CM

## 2023-03-20 DIAGNOSIS — E559 Vitamin D deficiency, unspecified: Secondary | ICD-10-CM

## 2023-03-20 DIAGNOSIS — L409 Psoriasis, unspecified: Secondary | ICD-10-CM | POA: Diagnosis not present

## 2023-03-20 DIAGNOSIS — L4050 Arthropathic psoriasis, unspecified: Secondary | ICD-10-CM

## 2023-03-20 DIAGNOSIS — Z8679 Personal history of other diseases of the circulatory system: Secondary | ICD-10-CM

## 2023-03-20 DIAGNOSIS — Z87891 Personal history of nicotine dependence: Secondary | ICD-10-CM

## 2023-03-20 DIAGNOSIS — M7661 Achilles tendinitis, right leg: Secondary | ICD-10-CM | POA: Diagnosis not present

## 2023-03-20 DIAGNOSIS — Z8639 Personal history of other endocrine, nutritional and metabolic disease: Secondary | ICD-10-CM | POA: Diagnosis not present

## 2023-03-20 NOTE — Addendum Note (Signed)
Addended by: Geroge Baseman C on: 03/20/2023 11:13 AM   Modules accepted: Orders

## 2023-03-21 LAB — COMPLETE METABOLIC PANEL WITH GFR
AG Ratio: 1.3 (calc) (ref 1.0–2.5)
ALT: 18 U/L (ref 6–29)
AST: 22 U/L (ref 10–35)
Albumin: 4.1 g/dL (ref 3.6–5.1)
Alkaline phosphatase (APISO): 45 U/L (ref 37–153)
BUN: 12 mg/dL (ref 7–25)
CO2: 27 mmol/L (ref 20–32)
Calcium: 9.4 mg/dL (ref 8.6–10.4)
Chloride: 102 mmol/L (ref 98–110)
Creat: 0.81 mg/dL (ref 0.50–1.03)
Globulin: 3.2 g/dL (ref 1.9–3.7)
Glucose, Bld: 94 mg/dL (ref 65–99)
Potassium: 5 mmol/L (ref 3.5–5.3)
Sodium: 139 mmol/L (ref 135–146)
Total Bilirubin: 0.7 mg/dL (ref 0.2–1.2)
Total Protein: 7.3 g/dL (ref 6.1–8.1)
eGFR: 85 mL/min/{1.73_m2} (ref >=60)

## 2023-03-21 LAB — CBC WITH DIFFERENTIAL/PLATELET
Absolute Monocytes: 688 {cells}/uL (ref 200–950)
Basophils Absolute: 52 {cells}/uL (ref 0–200)
Basophils Relative: 0.7 %
Eosinophils Absolute: 81 {cells}/uL (ref 15–500)
Eosinophils Relative: 1.1 %
HCT: 40.2 % (ref 35.0–45.0)
Hemoglobin: 13.5 g/dL (ref 11.7–15.5)
Lymphs Abs: 2775 {cells}/uL (ref 850–3900)
MCH: 32 pg (ref 27.0–33.0)
MCHC: 33.6 g/dL (ref 32.0–36.0)
MCV: 95.3 fL (ref 80.0–100.0)
MPV: 10.4 fL (ref 7.5–12.5)
Monocytes Relative: 9.3 %
Neutro Abs: 3804 {cells}/uL (ref 1500–7800)
Neutrophils Relative %: 51.4 %
Platelets: 278 10*3/uL (ref 140–400)
RBC: 4.22 10*6/uL (ref 3.80–5.10)
RDW: 12.1 % (ref 11.0–15.0)
Total Lymphocyte: 37.5 %
WBC: 7.4 10*3/uL (ref 3.8–10.8)

## 2023-03-21 LAB — LIPID PANEL
Cholesterol: 144 mg/dL (ref <200)
HDL: 73 mg/dL (ref >=50)
LDL Cholesterol (Calc): 51 mg/dL
Non-HDL Cholesterol (Calc): 71 mg/dL (ref <130)
Total CHOL/HDL Ratio: 2 (calc) (ref <5.0)
Triglycerides: 121 mg/dL (ref <150)

## 2023-03-21 NOTE — Progress Notes (Signed)
CBC and CMP WNL  Lipid panel WNL

## 2023-03-21 NOTE — Progress Notes (Signed)
Please forward labs to PCP as requested

## 2023-03-23 ENCOUNTER — Ambulatory Visit: Payer: Medicare HMO | Attending: Internal Medicine | Admitting: Internal Medicine

## 2023-03-23 ENCOUNTER — Encounter: Payer: Self-pay | Admitting: Internal Medicine

## 2023-03-23 VITALS — BP 102/60 | HR 78 | Ht 66.0 in | Wt 228.8 lb

## 2023-03-23 DIAGNOSIS — Z8279 Family history of other congenital malformations, deformations and chromosomal abnormalities: Secondary | ICD-10-CM | POA: Diagnosis not present

## 2023-03-23 DIAGNOSIS — I700 Atherosclerosis of aorta: Secondary | ICD-10-CM | POA: Diagnosis not present

## 2023-03-23 DIAGNOSIS — I2510 Atherosclerotic heart disease of native coronary artery without angina pectoris: Secondary | ICD-10-CM | POA: Diagnosis not present

## 2023-03-23 DIAGNOSIS — J449 Chronic obstructive pulmonary disease, unspecified: Secondary | ICD-10-CM | POA: Diagnosis not present

## 2023-03-23 DIAGNOSIS — I10 Essential (primary) hypertension: Secondary | ICD-10-CM | POA: Diagnosis not present

## 2023-03-23 NOTE — Patient Instructions (Signed)
Medication Instructions:  Your physician recommends that you continue on your current medications as directed. Please refer to the Current Medication list given to you today.  *If you need a refill on your cardiac medications before your next appointment, please call your pharmacy*   Lab Work: NONE If you have labs (blood work) drawn today and your tests are completely normal, you will receive your results only by: MyChart Message (if you have MyChart) OR A paper copy in the mail If you have any lab test that is abnormal or we need to change your treatment, we will call you to review the results.   Testing/Procedures: NONE   Follow-Up:As Needed At [facility], you and your health needs are our priority.  As part of our continuing mission to provide you with exceptional heart care, we have created designated Provider Care Teams.  These Care Teams include your primary Cardiologist (physician) and Advanced Practice Providers (APPs -  Physician Assistants and Nurse Practitioners) who all work together to provide you with the care you need, when you need it.   Provider:   Mahesh Chandrasekhar, MD    

## 2023-03-23 NOTE — Progress Notes (Signed)
Cardiology Office Note:    Date:  03/23/2023   ID:  [name], DOB July 02, 1966, MRN [id number]  PCP:  Lupita Raider, MD   CC: CAD f/u  History of Present Illness:    [name] is a 56 y.o. female with a hx of HTN, HLD,former tobacco use- quit in 2011, morbid obesity, non-obstructive coronary artery disease, aortic atherosclerosis, bicuspid aortic valve in the family who presented 05/06/20 with angina pectoris.  2021:  Only non-obstructive CAD found on 05/10/20 LHC.  Echo performed 05/27/20 without evidence of bicuspid valve. 2022: No changes 2023: Doing well, hiking with no issues 2024: Saw Tessa, needed rare lasix.  Had Echo done.  No evidence of RV-PA uncoupling with ratio 1.23 at rest.  Patient notes that she is doing well.   Since last visit notes that she did well with diuretics.  Rarely needs it;  In August she has needed it once.. There are no interval hospital/ED visit.  No chest pain or pressure .  No SOB and no PND/Orthopnea.  No weight gain.  No palpitations or syncope.  Rare leg swelling at the end of the day.   Notes that with any time of strenuous exercise.  Feels that she needs to lose weight. She has seen weight and wellness.  She sees nutrition. She prepares her own food.  AMB BP ~ 130/80   Past Medical History:  Diagnosis Date   Allergy    Anxiety    Arthritis    Asthma    Back pain    Cataract    both eyes  surgically repaired   COPD (chronic obstructive pulmonary disease) (HCC)    Depression    treated   Dyspnea    Fibrocystic breast disease    GERD (gastroesophageal reflux disease)    History of hiatal hernia    repaired   History of kidney stones    questionable   Hyperlipidemia    Hypertension    pt denies   Hypothyroidism    Irritable bowel syndrome    Joint pain    Osteoarthritis    Ovarian cyst    Palpitations    Pancreatitis    PONV (postoperative nausea and vomiting)    NO TROUBLE WITH CATARACT SURGERY   Psoriasis     Psoriatic arthritis (HCC) 05/12/2016   Poor response to Humira Inadequate response to Enbrel Inadequate response to Simponi   Shortness of breath    Tachycardia    Ulcerative colitis     Past Surgical History:  Procedure Laterality Date   ABDOMINAL HYSTERECTOMY     ANKLE RECONSTRUCTION     left   bladder tuck     CARPAL TUNNEL RELEASE     CATARACT EXTRACTION W/PHACO Right 09/11/2017   Procedure: CATARACT EXTRACTION PHACO AND INTRAOCULAR LENS PLACEMENT (IOC);  Surgeon: Galen Manila, MD;  Location: [facility];  Service: Ophthalmology;  Laterality: Right;  [place] 00:18.5 AP% 6.9 CDE 1.29 Fluid Pack Lot # [id number]   CATARACT EXTRACTION W/PHACO Left 10/02/2017   Procedure: CATARACT EXTRACTION PHACO AND INTRAOCULAR LENS PLACEMENT (IOC);  Surgeon: Galen Manila, MD;  Location: [facility];  Service: Ophthalmology;  Laterality: Left;  [place] 00:25.9 AP% 6.9 CDE 1.80 Fluid Pack Lot # [id number]   CLOSED REDUCTION NASAL FRACTURE N/A 05/21/2018   Procedure: CLOSED REDUCTION NASAL FRACTURE;  Surgeon: Geanie Logan, MD;  Location: [facility];  Service: ENT;  Laterality: N/A;  Latex sensitivity   COLONOSCOPY     EYE SURGERY  Lasik   FRACTURE SURGERY     KNEE SURGERY     left   LAPAROSCOPIC NISSEN FUNDOPLICATION N/A 01/12/2017   Procedure: LAPAROSCOPIC NISSEN TAKEDOWN AND UPPER ENDOSCOPY;  Surgeon: Luretha Carranza, MD;  Location: [facility];  Service: General;  Laterality: N/A;   LEFT HEART CATH AND CORONARY ANGIOGRAPHY N/A 05/10/2020   Procedure: LEFT HEART CATH AND CORONARY ANGIOGRAPHY;  Surgeon: Lyn Records, MD;  Location: [facility];  Service: Cardiovascular;  Laterality: N/A;   NASAL SEPTUM SURGERY     NISSEN FUNDOPLICATION     PARTIAL HYSTERECTOMY     POLYPECTOMY     repair cystocele and rectocele     thyroid ablation     TUBAL LIGATION     UPPER GASTROINTESTINAL ENDOSCOPY     UPPER GI ENDOSCOPY  01/12/2017   Procedure: UPPER GI ENDOSCOPY;  Surgeon: Luretha Standing, MD;  Location: [facility];  Service: General;;   Current Medications: Current Meds  Medication Sig   albuterol (PROVENTIL) (2.5 MG/3ML) 0.083% nebulizer solution Take 3 mLs (2.5 mg total) by nebulization every 6 (six) hours as needed for wheezing or shortness of breath.   albuterol (VENTOLIN HFA) 108 (90 Base) MCG/ACT inhaler Inhale 2 puffs into the lungs every 6 (six) hours as needed for wheezing or shortness of breath.   aspirin-acetaminophen-caffeine (EXCEDRIN MIGRAINE) 250-250-65 MG tablet Take 2 tablets by mouth every 8 (eight) hours as needed for headache.    Cholecalciferol (VITAMIN D3) 50 MCG (2000 UT) CAPS Take 2,000 Units by mouth.   clobetasol ointment (TEMOVATE) 0.05 % Apply 1 application topically 2 (two) times daily.   dicyclomine (BENTYL) 20 MG tablet Take 1 tablet (20 mg total) by mouth as needed.   estradiol (ESTRACE) 1 MG tablet Take 1 mg by mouth daily.   fluticasone (FLONASE) 50 MCG/ACT nasal spray Place 1 spray into both nostrils daily.   folic acid (FOLVITE) 1 MG tablet Take 2 mg by mouth daily.   furosemide (LASIX) 20 MG tablet Take 1 tablet (20 mg total) by mouth daily as needed (LOWER LEG EDEMA).   levocetirizine (XYZAL) 5 MG tablet Take 5 mg by mouth every evening.   levothyroxine (SYNTHROID) 125 MCG tablet Take 125 mcg by mouth every other day. Alternate with 137 mcg   levothyroxine (SYNTHROID) 137 MCG tablet Take 137 mcg by mouth every other day. Alternate with 125 mcg   mesalamine (APRISO) 0.375 g 24 hr capsule Take 4 capsules (1.5 g total) by mouth daily.   mesalamine (ROWASA) 4 g enema Place 60 mLs (4 g total) rectally at bedtime. Lay on left side, retain as long as able   methotrexate 50 MG/2ML injection INJECT 0.6ML UNDER THE SKIN ONE TIME WEEKLY. DISCARD VIAL 28 DAYS AFTER FIRST USE   metoprolol succinate (TOPROL-XL) 50 MG 24 hr tablet Take 25 mg by mouth daily. Take with or immediately following a meal.   montelukast (SINGULAIR) 10 MG tablet Take 10 mg by  mouth at bedtime.   omeprazole (PRILOSEC) 20 MG capsule TAKE 1 CAPSULE EVERY DAY BEFORE BREAKFAST   rosuvastatin (CRESTOR) 20 MG tablet Take 20 mg by mouth daily.   Tuberculin-Allergy Syringes 27G X 1/2" 1 ML KIT Inject 1 Syringe into the skin once a week. 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    Employer: [employer]   Occupation: disabled  Tobacco Use   Smoking status: Former    Current packs/day: 0.00    Average packs/day: 1 pack/day for 37.5 years (37.5 ttl pk-yrs)    Types: Cigarettes    Start date: 01/26/1972    Quit date: 07/27/2009    Years since quitting: 13.6    Passive exposure: Never   Smokeless tobacco: Never  Vaping Use   Vaping status: Never Used  Substance and Sexual Activity   Alcohol use: Yes    Alcohol/week: 0.0 standard drinks of alcohol    Comment:  occasionally (1x/mo)   Drug use: No   Sexual activity: Yes  Other Topics Concern   Not on file  Social History Narrative   Not on file   Social Determinants of Health   Financial Resource Strain: Not on file  Food Insecurity: Not on file  Transportation Needs: Not on file  Physical Activity: Not on file  Stress: Not on file  Social Connections: Not on file    Family History: The patient's family history includes Alcohol abuse in her father; Alcoholism in her sister; Breast cancer in her maternal grandmother and paternal aunt; Breast cancer (age of onset: 39) in her mother; Cancer in her mother;  Colon cancer in her mother; Colon polyps in her mother and paternal grandmother; Crohn's disease in her daughter and sister; Depression in her mother; Heart disease in her father and mother; Hyperlipidemia in her father and mother; Hypertension in her father and mother; Irritable bowel syndrome in an other family member; Neuropathy in her father; Other in her mother; Stroke in her father. There is no history of Esophageal cancer, Rectal cancer, Stomach cancer, or Pancreatic cancer.  Family history of early heart attacks (mom in 68s) Family history of mechanical heart valve after bicuspid valve  ROS:   Please see the history of present illness.     EKGs/Labs/Other Studies Reviewed:    The following studies were reviewed today:  Cardiac Studies & Procedures   CARDIAC CATHETERIZATION  CARDIAC CATHETERIZATION 05/10/2020  Narrative  Normal left ventricular function with LVEDP 15%.  Estimated ejection fraction 55%.  Widely patent left main.  10 to 20% ostial LAD.  10 to 20% ostial circumflex.  Dominant normal RCA.  RECOMMENDATIONS:   Continue aggressive risk factor modification.  Exercise program.  If convincing story for angina, consider microvascular dysfunction.  Findings Coronary Findings Diagnostic  Dominance: Right  Left Anterior Descending Ost LAD to Prox LAD lesion is 20% stenosed. Dist LAD lesion is 20% stenosed.  Left Circumflex Ost Cx to Prox Cx lesion is 20% stenosed.  Intervention  No interventions have been documented.     ECHOCARDIOGRAM  ECHOCARDIOGRAM COMPLETE 02/19/2023  Narrative ECHOCARDIOGRAM REPORT    Patient Name:   HENRIE MOORING Date of Exam: 02/19/2023 Medical Rec #:  [id number]       Height:       66.0 in Accession #:    [id number]      Weight:       223.0 lb Date of Birth:  01/31/1966       BSA:  2.094 m Patient Age:    57 years        BP:           124/86 mmHg Patient Gender: F               HR:           75 bpm. Exam  Location:  [address]  Procedure: 2D Echo, Cardiac Doppler and Color Doppler  Indications:    R06.00 Dyspnea  History:        Patient has prior history of Echocardiogram examinations, most recent 05/27/2020. COPD, Arrythmias:Tachycardia, Signs/Symptoms:Dyspnea and Edema; Risk Factors:Family History of Coronary Artery Disease, Hypertension, Dyslipidemia and Former Smoker.  Sonographer:    Farrel Conners RDCS Referring Phys: Bernadette Hoit CONTE  IMPRESSIONS   1. Left ventricular ejection fraction, by estimation, is 60 to 65%. The left ventricle has normal function. The left ventricle has no regional wall motion abnormalities. Left ventricular diastolic parameters are consistent with Grade I diastolic dysfunction (impaired relaxation). 2. Right ventricular systolic function is normal. The right ventricular size is normal. There is normal pulmonary artery systolic pressure. The estimated right ventricular systolic pressure is 17.0 mmHg. 3. The mitral valve is grossly normal. Trivial mitral valve regurgitation. 4. The aortic valve is tricuspid. Aortic valve regurgitation is not visualized. 5. The inferior vena cava is normal in size with greater than 50% respiratory variability, suggesting right atrial pressure of 3 mmHg.  Comparison(s): Changes from prior study are noted. 05/27/2020: LVEF 60-65%, RA pressure 3 mmHg.  FINDINGS Left Ventricle: Left ventricular ejection fraction, by estimation, is 60 to 65%. The left ventricle has normal function. The left ventricle has no regional wall motion abnormalities. The left ventricular internal cavity size was normal in size. There is no left ventricular hypertrophy. Left ventricular diastolic parameters are consistent with Grade I diastolic dysfunction (impaired relaxation). Indeterminate filling pressures.  Right Ventricle: The right ventricular size is normal. No increase in right ventricular wall thickness. Right ventricular systolic function is  normal. There is normal pulmonary artery systolic pressure. The tricuspid regurgitant velocity is 1.87 m/s, and with an assumed right atrial pressure of 3 mmHg, the estimated right ventricular systolic pressure is 17.0 mmHg.  Left Atrium: Left atrial size was normal in size.  Right Atrium: Right atrial size was normal in size.  Pericardium: There is no evidence of pericardial effusion.  Mitral Valve: The mitral valve is grossly normal. Trivial mitral valve regurgitation.  Tricuspid Valve: The tricuspid valve is grossly normal. Tricuspid valve regurgitation is trivial.  Aortic Valve: The aortic valve is tricuspid. Aortic valve regurgitation is not visualized.  Pulmonic Valve: The pulmonic valve was normal in structure. Pulmonic valve regurgitation is not visualized.  Aorta: The aortic root and ascending aorta are structurally normal, with no evidence of dilitation.  Venous: The inferior vena cava is normal in size with greater than 50% respiratory variability, suggesting right atrial pressure of 3 mmHg.  IAS/Shunts: No atrial level shunt detected by color flow Doppler.   LEFT VENTRICLE PLAX 2D LVIDd:         4.40 cm   Diastology LVIDs:         2.60 cm   LV e' medial:    6.64 cm/s LV PW:         0.80 cm   LV E/e' medial:  10.8 LV IVS:        0.75 cm   LV e' lateral:   12.10 cm/s LVOT diam:  2.40 cm   LV E/e' lateral: 5.9 LV SV:         98 LV SV Index:   47 LVOT Area:     4.52 cm   RIGHT VENTRICLE RV Basal diam:  3.00 cm RV S prime:     11.30 cm/s TAPSE (M-mode): 2.1 cm RVSP:           17.0 mmHg  LEFT ATRIUM             Index        RIGHT ATRIUM           Index LA diam:        3.70 cm 1.77 cm/m   RA Pressure: 3.00 mmHg LA Vol (A2C):   39.6 ml 18.91 ml/m  RA Area:     13.70 cm LA Vol (A4C):   34.4 ml 16.42 ml/m  RA Volume:   36.10 ml  17.24 ml/m LA Biplane Vol: 37.6 ml 17.95 ml/m AORTIC VALVE LVOT Vmax:   112.00 cm/s LVOT Vmean:  71.050 cm/s LVOT VTI:    0.218  m  AORTA Ao Root diam: 2.80 cm Ao Asc diam:  2.90 cm  MITRAL VALVE               TRICUSPID VALVE MV Area (PHT): cm         TR Peak grad:   14.0 mmHg MV Decel Time: 191 msec    TR Vmax:        187.00 cm/s MV E velocity: 71.80 cm/s  Estimated RAP:  3.00 mmHg MV A velocity: 65.80 cm/s  RVSP:           17.0 mmHg MV E/A ratio:  1.09 SHUNTS Systemic VTI:  0.22 m Systemic Diam: 2.40 cm  Zoila Shutter MD Electronically signed by Zoila Shutter MD Signature Date/Time: 02/19/2023/12:23:44 PM    Final              Recent Labs: 03/20/2023: ALT 18; BUN 12; Creat 0.81; Hemoglobin 13.5; Platelets 278; Potassium 5.0; Sodium 139  Recent Lipid Panel    Component Value Date/Time   CHOL 144 03/20/2023 0944   CHOL 145 10/16/2022 1513   TRIG 121 03/20/2023 0944   HDL 73 03/20/2023 0944   HDL 61 10/16/2022 1513   CHOLHDL 2.0 03/20/2023 0944   LDLCALC 51 03/20/2023 0944   Physical Exam:    VS:  BP 102/60   Pulse 78   Ht 5\' 6"  (1.676 m)   Wt 228 lb 12.8 oz (103.8 kg)   SpO2 98%   BMI 36.93 kg/m     Wt Readings from Last 3 Encounters:  03/23/23 228 lb 12.8 oz (103.8 kg)  03/20/23 228 lb 9.6 oz (103.7 kg)  02/28/23 225 lb (102.1 kg)    GEN: Obese, well developed in no acute distress HEENT: Normal NECK: No JVD CARDIAC: RRR, no murmurs, rubs, gallops  RESPIRATORY:  Clear to auscultation without rales, wheezing or rhonchi  ABDOMEN: Soft, non-tender, non-distended MUSCULOSKELETAL:  No edema; No deformity  SKIN: Warm and dry NEUROLOGIC:  Alert and oriented x 3 PSYCHIATRIC:  Normal affect   ASSESSMENT:    1. Essential hypertension   2. Coronary artery disease involving native coronary artery of native heart without angina pectoris   3. Aortic atherosclerosis (HCC)   4. Chronic obstructive pulmonary disease, unspecified COPD type (HCC)     PLAN:    DOE (now resolved) with no RV-PA uncoupling at resting and morbid obesity Chronic obstructive  pulmonary disease - sees PCCM -  given Lasix as PRN - Echo showed only mild decrease in medial E prime velocity;  - no history of MEN2a or medullary thyroid cancer - Rare pancreatitis on Imuran (when diagnosed with UC) - BMI is 30+  with weight related comorbidities including but without PAD, Prior MI, or DM - No weight loss  (< 5%) with comprehensive lifestyle interventions that include nutrition changes that caused her husband to lose 60 lbs - would meet criteria for GLP-1 based therapy; unfortunately her insurance would not cover this - we encouraged her to reach out if her A1c elevates as we may be able to start therapy  Hypertension - BP controlled on current regimen, no changes for now - asked her to do AMB BP checks; based on this we may be able to wean off her metoprolol (save for if symptoms of CP)  Coronary artery disease Aortic atherosclerosis HLD - ASA 81 mg PO Daily recommended; not on ASA due to Excedrin use - LDL goal < 70; continue current statin - continue BB  Family history of Bicuspid Aortic Valve - tricuspid aortic valve  She would like to come in only PRN.  We are focusing on secondary prevention - if Dr. Clelia Croft is comfortable with this, we can see her PRN -if PCP would like prevention support, encouraged one year f/u   Medication Adjustments/Labs and Tests Ordered: Current medicines are reviewed at length with the patient today.  Concerns regarding medicines are outlined above.  No orders of the defined types were placed in this encounter.  No orders of the defined types were placed in this encounter.   Patient Instructions  Medication Instructions:  Your physician recommends that you continue on your current medications as directed. Please refer to the Current Medication list given to you today.  *If you need a refill on your cardiac medications before your next appointment, please call your pharmacy*   Lab Work: NONE If you have labs (blood work) drawn today and your tests are  completely normal, you will receive your results only by: MyChart Message (if you have MyChart) OR A paper copy in the mail If you have any lab test that is abnormal or we need to change your treatment, we will call you to review the results.   Testing/Procedures: NONE   Follow-Up:As Needed At [facility], you and your health needs are our priority.  As part of our continuing mission to provide you with exceptional heart care, we have created designated Provider Care Teams.  These Care Teams include your primary Cardiologist (physician) and Advanced Practice Providers (APPs -  Physician Assistants and Nurse Practitioners) who all work together to provide you with the care you need, when you need it.    Provider:   Riley Lam, MD       Signed, Christell Constant, MD  03/23/2023 12:37 PM    [facility]

## 2023-03-30 ENCOUNTER — Ambulatory Visit
Admission: RE | Admit: 2023-03-30 | Discharge: 2023-03-30 | Disposition: A | Discharge status: Home / Self Care | Payer: Medicare HMO | Source: Ambulatory Visit | Attending: Family Medicine | Admitting: Family Medicine

## 2023-03-30 DIAGNOSIS — Z122 Encounter for screening for malignant neoplasm of respiratory organs: Secondary | ICD-10-CM | POA: Diagnosis not present

## 2023-03-30 DIAGNOSIS — Z87891 Personal history of nicotine dependence: Secondary | ICD-10-CM | POA: Diagnosis not present

## 2023-04-04 DIAGNOSIS — Z961 Presence of intraocular lens: Secondary | ICD-10-CM | POA: Diagnosis not present

## 2023-04-05 ENCOUNTER — Ambulatory Visit: Payer: Medicare HMO | Admitting: Internal Medicine

## 2023-04-11 ENCOUNTER — Other Ambulatory Visit: Payer: Self-pay | Admitting: Gastroenterology

## 2023-04-16 ENCOUNTER — Other Ambulatory Visit: Payer: Self-pay | Admitting: Acute Care

## 2023-04-16 DIAGNOSIS — Z122 Encounter for screening for malignant neoplasm of respiratory organs: Secondary | ICD-10-CM

## 2023-04-16 DIAGNOSIS — Z87891 Personal history of nicotine dependence: Secondary | ICD-10-CM

## 2023-04-17 ENCOUNTER — Ambulatory Visit: Payer: Medicare HMO | Admitting: Internal Medicine

## 2023-05-04 ENCOUNTER — Other Ambulatory Visit: Payer: Self-pay | Admitting: Rheumatology

## 2023-05-04 NOTE — Telephone Encounter (Signed)
Last Fill: 02/05/2023  Labs: 03/20/2023 CBC and CMP WNL Lipid panel WNL  TB Gold: 10/16/2022  TB gold negative   Next Visit: 08/08/2023  Last Visit: 03/20/2023  DX: Psoriatic arthritis    Current Dose per office note 03/20/2023: [name] 5 mg 1 tablet by mouth twice daily,   Okay to refill [name]?

## 2023-05-12 ENCOUNTER — Other Ambulatory Visit: Payer: Self-pay | Admitting: Rheumatology

## 2023-05-14 NOTE — Telephone Encounter (Signed)
Last Fill: 03/07/2023  Labs: 03/20/2023 CBC and CMP WNL Lipid panel WNL  Next Visit: 08/08/2023  Last Visit: 03/20/2023  DX: Psoriatic arthritis   Current Dose per office note 03/20/2023: methotrexate 0.6 ml sq once weekly   Okay to refill Methotrexate?

## 2023-05-23 ENCOUNTER — Other Ambulatory Visit: Payer: Self-pay | Admitting: Family Medicine

## 2023-05-23 DIAGNOSIS — E039 Hypothyroidism, unspecified: Secondary | ICD-10-CM | POA: Diagnosis not present

## 2023-05-23 DIAGNOSIS — Z13820 Encounter for screening for osteoporosis: Secondary | ICD-10-CM

## 2023-05-23 DIAGNOSIS — E2839 Other primary ovarian failure: Secondary | ICD-10-CM

## 2023-05-23 DIAGNOSIS — L4050 Arthropathic psoriasis, unspecified: Secondary | ICD-10-CM | POA: Diagnosis not present

## 2023-05-23 DIAGNOSIS — Z0000 Encounter for general adult medical examination without abnormal findings: Secondary | ICD-10-CM | POA: Diagnosis not present

## 2023-05-23 DIAGNOSIS — I700 Atherosclerosis of aorta: Secondary | ICD-10-CM | POA: Diagnosis not present

## 2023-05-23 DIAGNOSIS — R7303 Prediabetes: Secondary | ICD-10-CM | POA: Diagnosis not present

## 2023-05-23 DIAGNOSIS — J449 Chronic obstructive pulmonary disease, unspecified: Secondary | ICD-10-CM | POA: Diagnosis not present

## 2023-05-23 DIAGNOSIS — D84821 Immunodeficiency due to drugs: Secondary | ICD-10-CM | POA: Diagnosis not present

## 2023-05-23 DIAGNOSIS — E7800 Pure hypercholesterolemia, unspecified: Secondary | ICD-10-CM | POA: Diagnosis not present

## 2023-05-23 DIAGNOSIS — I10 Essential (primary) hypertension: Secondary | ICD-10-CM | POA: Diagnosis not present

## 2023-05-23 DIAGNOSIS — F339 Major depressive disorder, recurrent, unspecified: Secondary | ICD-10-CM | POA: Diagnosis not present

## 2023-06-04 ENCOUNTER — Other Ambulatory Visit: Payer: Medicare HMO

## 2023-06-04 DIAGNOSIS — K5100 Ulcerative (chronic) pancolitis without complications: Secondary | ICD-10-CM | POA: Diagnosis not present

## 2023-06-06 LAB — CALPROTECTIN, FECAL: Calprotectin, Fecal: 30 ug/g (ref 0–120)

## 2023-06-27 ENCOUNTER — Encounter: Payer: Self-pay | Admitting: Gastroenterology

## 2023-06-27 ENCOUNTER — Encounter: Payer: Self-pay | Admitting: Emergency Medicine

## 2023-06-27 ENCOUNTER — Ambulatory Visit: Payer: Medicare HMO | Admitting: Gastroenterology

## 2023-06-27 ENCOUNTER — Ambulatory Visit: Payer: Medicare HMO | Admitting: Emergency Medicine

## 2023-06-27 VITALS — BP 110/80 | HR 80 | Ht 66.0 in | Wt 221.2 lb

## 2023-06-27 VITALS — BP 126/84 | HR 97 | Temp 97.9°F | Ht 66.0 in | Wt 221.6 lb

## 2023-06-27 DIAGNOSIS — J309 Allergic rhinitis, unspecified: Secondary | ICD-10-CM

## 2023-06-27 DIAGNOSIS — K5100 Ulcerative (chronic) pancolitis without complications: Secondary | ICD-10-CM

## 2023-06-27 DIAGNOSIS — L4050 Arthropathic psoriasis, unspecified: Secondary | ICD-10-CM

## 2023-06-27 DIAGNOSIS — K5190 Ulcerative colitis, unspecified, without complications: Secondary | ICD-10-CM | POA: Diagnosis not present

## 2023-06-27 DIAGNOSIS — J449 Chronic obstructive pulmonary disease, unspecified: Secondary | ICD-10-CM

## 2023-06-27 DIAGNOSIS — Z87891 Personal history of nicotine dependence: Secondary | ICD-10-CM | POA: Diagnosis not present

## 2023-06-27 DIAGNOSIS — K219 Gastro-esophageal reflux disease without esophagitis: Secondary | ICD-10-CM | POA: Diagnosis not present

## 2023-06-27 DIAGNOSIS — J301 Allergic rhinitis due to pollen: Secondary | ICD-10-CM

## 2023-06-27 DIAGNOSIS — G478 Other sleep disorders: Secondary | ICD-10-CM

## 2023-06-27 NOTE — Progress Notes (Signed)
[name]    [id number]    09/25/1965  Primary Care Physician:Shaw, Rockney Ghee, MD  Referring Physician: Lupita Raider, MD [address] [place],  [place] [address]   Chief complaint: Ulcerative colitis  Discussed the use of AI scribe software for clinical note transcription with the patient, who gave verbal consent to proceed.  History of Present Illness   57 year old very pleasant female with history of chronic ulcerative pancolitis and psoriasis, has been managing her conditions with Apriso, methotrexate, and Xeljanz. However, she reports that she will no longer qualify for the program that provides her [name] next year, and the medication is not included in her [facility] plan. She has previously tried Humira, but it was ineffective for her. She also inquires about the necessity of continuing methotrexate indefinitely.  She has been experiencing no blood in stool, with one to two bowel movements a day, no diarrhea, mucus, or pain, indicating good control of her ulcerative colitis. She has reduced her Apriso intake to two pills a day from four. She also mentions that she has enough [name] to last through February of the upcoming year.  She has previously provided a stool sample, which showed a fecal calprotectin level of 30, indicating no active inflammation. She has not required any new blood work, and her previous labs have been satisfactory. She has been keeping up with her TB tests and hepatitis checks, and she is vaccinated for hepatitis A and B. She has not experienced any flare-ups or issues with her current medication regimen.      GI history: Ulcerative colitis diagnosed in 2002, colonoscopy 2011 showed pancolitis.  Colonoscopy December 2014 showed chronic active colitis from 0 to 30 cm and normal transverse and right colon. Colonoscopy Nov 14, 2017 negative for active colitis   Colonoscopy 02/23/20 - The perianal and digital rectal examinations  were normal. - A 5 mm polyp [tubular adenoma] was found in the transverse colon. The polyp was sessile. The polyp was removed with a cold snare. Resection and retrieval were complete. - A localized area of mildly nodular mucosa was found in the rectum. Biopsies were taken with a cold forceps for histology. - Non-bleeding internal hemorrhoids were found during retroflexion. The hemorrhoids were medium-sized. - The exam was otherwise without abnormality.   Colonoscopy January 05, 2023: - The examined portion of the ileum was normal. - Mild ( Mayo Score 1) left- sided ulcerative colitis. Biopsied, showed mild to moderate chronic active colitis. - Diverticulosis in the sigmoid colon. - Non- bleeding internal hemorrhoids.  Chronic GERD status post Nissen fundoplication EGD February 2011 negative for Barrett's esophagus  Outpatient Encounter Medications as of 06/27/2023  Medication Sig   albuterol (PROVENTIL) (2.5 MG/3ML) 0.083% nebulizer solution Take 3 mLs (2.5 mg total) by nebulization every 6 (six) hours as needed for wheezing or shortness of breath.   albuterol (VENTOLIN HFA) 108 (90 Base) MCG/ACT inhaler Inhale 2 puffs into the lungs every 6 (six) hours as needed for wheezing or shortness of breath.   aspirin-acetaminophen-caffeine (EXCEDRIN MIGRAINE) 250-250-65 MG tablet Take 2 tablets by mouth every 8 (eight) hours as needed for headache.    Cholecalciferol (VITAMIN D3) 50 MCG (2000 UT) CAPS Take 2,000 Units by mouth.   clobetasol ointment (TEMOVATE) 0.05 % Apply 1 application topically 2 (two) times daily.   dicyclomine (BENTYL) 20 MG tablet Take 1 tablet (20 mg total) by mouth as needed.   estradiol (ESTRACE)  1 MG tablet Take 1 mg by mouth daily.   fluticasone (FLONASE) 50 MCG/ACT nasal spray Place 1 spray into both nostrils daily.   folic acid (FOLVITE) 1 MG tablet Take 2 mg by mouth daily.   furosemide (LASIX) 20 MG tablet Take 1 tablet (20 mg total) by mouth daily as needed (LOWER LEG  EDEMA).   levocetirizine (XYZAL) 5 MG tablet Take 5 mg by mouth every evening.   levothyroxine (SYNTHROID) 125 MCG tablet Take 125 mcg by mouth every other day. Alternate with 137 mcg   levothyroxine (SYNTHROID) 137 MCG tablet Take 137 mcg by mouth every other day. Alternate with 125 mcg   mesalamine (APRISO) 0.375 g 24 hr capsule Take 4 capsules (1.5 g total) by mouth daily. (Patient taking differently: Take 750 mg by mouth daily.)   mesalamine (ROWASA) 4 g enema Place 60 mLs (4 g total) rectally at bedtime. Lay on left side, retain as long as able   methotrexate 50 MG/2ML injection INJECT 0.6ML UNDER THE SKIN ONE TIME WEEKLY. DISCARD VIAL 28 DAYS AFTER FIRST USE   metoprolol succinate (TOPROL-XL) 50 MG 24 hr tablet Take 25 mg by mouth daily. Take with or immediately following a meal.   montelukast (SINGULAIR) 10 MG tablet Take 10 mg by mouth at bedtime.   omeprazole (PRILOSEC) 20 MG capsule TAKE 1 CAPSULE EVERY DAY BEFORE BREAKFAST   rosuvastatin (CRESTOR) 20 MG tablet Take 20 mg by mouth daily.   Tuberculin-Allergy Syringes 27G X 1/2" 1 ML KIT Inject 1 Syringe into the skin once a week. To be used with weekly methotrexate injections   WEGOVY 0.25 MG/0.5ML SOAJ Inject 0.25 mg into the skin once a week.   XELJANZ 5 MG TABS Take 1 tablet by mouth twice a day   [DISCONTINUED] pantoprazole (PROTONIX) 40 MG tablet Take 1 tablet (40 mg total) by mouth 2 (two) times daily.   No facility-administered encounter medications on file as of 06/27/2023.    Allergies as of 06/27/2023 - Review Complete 06/27/2023  Allergen Reaction Noted   Demerol [meperidine] Hives 10/15/2019   Imuran [azathioprine] Other (See Comments) 08/24/2009   Meperidine hcl Hives 08/24/2009   Remicade [infliximab] Hives and Other (See Comments) 01/26/2011   Sulfasalazine Swelling and Hives 08/04/2011   Avelox [moxifloxacin] Rash 08/24/2009   Doxycycline Rash    Erythromycin Rash 08/24/2009   Latex Rash 08/24/2009    Past  Medical History:  Diagnosis Date   Allergy    Anxiety    Arthritis    Asthma    Back pain    Cataract    both eyes  surgically repaired   COPD (chronic obstructive pulmonary disease) (HCC)    Depression    treated   Dyspnea    Fibrocystic breast disease    GERD (gastroesophageal reflux disease)    History of hiatal hernia    repaired   History of kidney stones    questionable   Hyperlipidemia    Hypertension    pt denies   Hypothyroidism    Irritable bowel syndrome    Joint pain    Osteoarthritis    Ovarian cyst    Palpitations    Pancreatitis    PONV (postoperative nausea and vomiting)    NO TROUBLE WITH CATARACT SURGERY   Psoriasis    Psoriatic arthritis (HCC) 05/12/2016   Poor response to Humira Inadequate response to Enbrel Inadequate response to Simponi   Shortness of breath    Tachycardia    Ulcerative colitis  Past Surgical History:  Procedure Laterality Date   ABDOMINAL HYSTERECTOMY     ANKLE RECONSTRUCTION     left   bladder tuck     CARPAL TUNNEL RELEASE     CATARACT EXTRACTION W/PHACO Right 09/11/2017   Procedure: CATARACT EXTRACTION PHACO AND INTRAOCULAR LENS PLACEMENT (IOC);  Surgeon: Galen Manila, MD;  Location: [facility];  Service: Ophthalmology;  Laterality: Right;  [place] 00:18.5 AP% 6.9 CDE 1.29 Fluid Pack Lot # [id number]   CATARACT EXTRACTION W/PHACO Left 10/02/2017   Procedure: CATARACT EXTRACTION PHACO AND INTRAOCULAR LENS PLACEMENT (IOC);  Surgeon: Galen Manila, MD;  Location: [facility];  Service: Ophthalmology;  Laterality: Left;  [place] 00:25.9 AP% 6.9 CDE 1.80 Fluid Pack Lot # [id number] H   CLOSED REDUCTION NASAL FRACTURE N/A 05/21/2018   Procedure: CLOSED REDUCTION NASAL FRACTURE;  Surgeon: Geanie Logan, MD;  Location: [facility];  Service: ENT;  Laterality: N/A;  Latex sensitivity   COLONOSCOPY     EYE SURGERY     Lasik   FRACTURE SURGERY     KNEE SURGERY     left   LAPAROSCOPIC NISSEN FUNDOPLICATION N/A 01/12/2017    Procedure: LAPAROSCOPIC NISSEN TAKEDOWN AND UPPER ENDOSCOPY;  Surgeon: Luretha Akkerman, MD;  Location: [facility];  Service: General;  Laterality: N/A;   LEFT HEART CATH AND CORONARY ANGIOGRAPHY N/A 05/10/2020   Procedure: LEFT HEART CATH AND CORONARY ANGIOGRAPHY;  Surgeon: Lyn Records, MD;  Location: [facility];  Service: Cardiovascular;  Laterality: N/A;   NASAL SEPTUM SURGERY     NISSEN FUNDOPLICATION     PARTIAL HYSTERECTOMY     POLYPECTOMY     repair cystocele and rectocele     thyroid ablation     TUBAL LIGATION     UPPER GASTROINTESTINAL ENDOSCOPY     UPPER GI ENDOSCOPY  01/12/2017   Procedure: UPPER GI ENDOSCOPY;  Surgeon: Luretha Bembenek, MD;  Location: [facility];  Service: General;;    Family History  Problem Relation Age of Onset   Breast cancer Mother 45   Heart disease Mother    Colon polyps Mother    Other Mother        pre-cancerous tumor removed   Colon cancer Mother    Hypertension Mother    Hyperlipidemia Mother    Cancer Mother    Depression Mother    Heart disease Father    Hypertension Father    Hyperlipidemia Father    Stroke Father    Alcohol abuse Father    Neuropathy Father    Crohn's disease Sister    Alcoholism Sister    Breast cancer Paternal Aunt    Breast cancer Maternal Grandmother    Colon polyps Paternal Grandmother    Crohn's disease Daughter    Irritable bowel syndrome Other    Esophageal cancer Neg Hx    Rectal cancer Neg Hx    Stomach cancer Neg Hx    Pancreatic cancer Neg Hx     Social History   Socioeconomic History   Marital status: Married    Spouse name: Not on file   Number of children: Not on file   Years of education: Not on file   Highest education level: Not on file  Occupational History   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, 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    Employer: [employer]   Occupation: disabled  Tobacco Use   Smoking status: Former    Current packs/day: 0.00    Average packs/day: 1 pack/day for 37.5 years (37.5 ttl  pk-yrs)    Types: Cigarettes    Start date: 01/26/1972    Quit date: 07/27/2009    Years since quitting: 13.9    Passive exposure: Never   Smokeless tobacco: Never  Vaping Use   Vaping status: Never Used  Substance and Sexual Activity   Alcohol use: Yes    Alcohol/week: 0.0 standard drinks of alcohol    Comment:  occasionally (1x/mo)   Drug use: No   Sexual activity: Yes  Other Topics Concern   Not on file  Social History Narrative   Not on file   Social Determinants of Health   Financial Resource Strain: Not on file  Food Insecurity: Not on file  Transportation Needs: Not on file  Physical Activity: Not on file  Stress: Not on file  Social Connections: Not on file  Intimate Partner Violence: Not on file      Review of systems: All other review of systems negative except as mentioned in the HPI.   Physical Exam: Vitals:   06/27/23 1323  BP: 110/80  Pulse: 80   Body mass index is 35.71 kg/m. Gen:      No acute distress HEENT:  sclera anicteric CV: s1s2 rrr, no murmur Lungs: B/l clear. Abd:      soft, non-tender; no palpable masses, no distension Ext:    No edema Neuro: alert and oriented x 3 Psych: normal mood and affect  Data Reviewed:  Reviewed labs, radiology imaging, old records and pertinent past GI work up     Assessment and Plan 57 year old female with history of psoriasis, psoriatic arthritis, pancolonic ulcerative colitis on [name] and weekly methotrexate She is doing well overall    Ulcerative Colitis and Psoriatic Arthritis Currently managed with [name], methotrexate and Apriso. [name] will be out of formulary next year. No current symptoms of colitis. Fecal calprotectin is 30, indicating no active inflammation. -Plan to switch from [place] to Rinvoq, which is on formulary and effective for both conditions.  She was prescribed [name] and methotrexate by her rheumatologist.will request her rheumatologist to consider this switching  to Rinvoq, start with induction dose of 45mg  for 8 weeks, then maintenance dose of 15 mg daily. -Continue Apriso 2 pills daily. Consider discontinuing Apriso after 3 months on stable Rinvoq dose if no flare-ups occur. She is up-to-date with IBD health maintenance  General Health Maintenance / Followup Plans -Next colonoscopy due in July 2027. -Return for follow-up in March 2025 to assess response to medication changes.       This visit required >30 minutes of patient care (this includes precharting, chart review, review of results, face-to-face time used for counseling as well as treatment plan and follow-up. The patient was provided an opportunity to ask questions and all were answered. The patient agreed with the plan and demonstrated an understanding of the instructions.  Iona Beard , MD    CC: Lupita Raider, MD

## 2023-06-27 NOTE — Assessment & Plan Note (Signed)
Get your repeat lung cancer screening CT chest next September as planned Follow Dr. Delton Coombes in September 2025 after your CT chest so we can review that study.

## 2023-06-27 NOTE — Patient Instructions (Addendum)
VISIT SUMMARY:  [name], during your visit today, we discussed your current management of ulcerative colitis and psoriatic arthritis. You reported good control of your symptoms with no recent flare-ups. We also addressed the upcoming change in your medication plan due to insurance coverage issues.  YOUR PLAN:  -ULCERATIVE COLITIS AND PSORIATIC ARTHRITIS: Ulcerative colitis is a chronic condition causing inflammation in the colon, and psoriatic arthritis is a type of arthritis that affects some people with psoriasis. Since you will no longer have access to [place] next year, we will recommend your Rheumatologist to consider Rinvoq, starting with a 45mg  dose for 2 months, then 15mg  daily. Continue taking Apriso at 2 pills daily, and we may consider stopping it after 3 months on Rinvoq if you remain stable. We will also continue to monitor your TB and Hepatitis status annually.  -GENERAL HEALTH MAINTENANCE: Your recent stool sample showed no active inflammation, and your previous labs have been satisfactory. Your next colonoscopy is due in July 2027. Continue with your routine health checks and vaccinations.  INSTRUCTIONS:  Please return for a follow-up appointment in March 2025 to assess your response to the medication changes.  I appreciate the  opportunity to care for you  Thank You   Marsa Aris , MD

## 2023-06-27 NOTE — Patient Instructions (Signed)
Get your repeat lung cancer screening CT chest next September as planned Continue your Singulair, Xyzal, Flonase nasal spray as you have been taking them Continue your omeprazole as you have been taking Keep your albuterol available to use 2 puffs when needed for shortness of breath, chest tightness, wheezing. We will hold off on starting any scheduled inhaler medication at this time but we could consider going forward depending on how your breathing is doing Flu shot is up-to-date Congratulations on weight loss.  Keep up the good work Follow Dr. Delton Coombes in September 2025 after your CT chest so we can review that study.

## 2023-06-27 NOTE — Assessment & Plan Note (Signed)
Continue your Singulair, Xyzal, Flonase nasal spray as you have been taking them

## 2023-06-27 NOTE — Assessment & Plan Note (Signed)
Continue to treat GERD, rhinitis as aggressively as possible since these contribute to her upper airway irritability

## 2023-06-27 NOTE — Assessment & Plan Note (Signed)
Continue omeprazole 

## 2023-06-27 NOTE — Assessment & Plan Note (Signed)
Keep your albuterol available to use 2 puffs when needed for shortness of breath, chest tightness, wheezing. We will hold off on starting any scheduled inhaler medication at this time but we could consider going forward depending on how your breathing is doing Flu shot is up-to-date

## 2023-06-27 NOTE — Progress Notes (Signed)
   Subjective:    Patient ID: [name], female    DOB: 15-Aug-1965, 57 y.o.   MRN: [id number]  HPI  ROV 06/27/2023 --follow-up visit for history of COPD, chronic cough in the setting of this as well as upper airway resistance syndrome, GERD, rhinitis.  She is not currently on any scheduled BD therapy.  She is treating with omeprazole, Singulair, Xyzal, fluticasone nasal sprays. Rare albuterol use although she does get SOB w hills. She is losing wt, has lost 9 lbs.   Lung cancer screening CT chest was done 03/30/2023 and reviewed by me, shows tiny right middle lobe pulmonary nodule approximately 1 mm without any other suspicious findings.  This is lung RADS 2.  Some cardiac atherosclerosis was noted   Review of Systems  Constitutional:  Negative for fever and unexpected weight change.  HENT:  Positive for postnasal drip. Negative for congestion, dental problem, ear pain, nosebleeds, rhinorrhea, sinus pressure, sneezing and trouble swallowing.   Eyes:  Negative for redness and itching.  Respiratory:  Positive for shortness of breath and wheezing. Negative for cough and chest tightness.   Cardiovascular:  Negative for palpitations and leg swelling.  Gastrointestinal:  Negative for nausea and vomiting.  Genitourinary:  Negative for dysuria.  Musculoskeletal:  Negative for joint swelling.  Skin:  Negative for rash.  Allergic/Immunologic: Negative.  Negative for environmental allergies, food allergies and immunocompromised state.  Neurological:  Negative for headaches.  Hematological:  Does not bruise/bleed easily.  Psychiatric/Behavioral:  Negative for dysphoric mood. The patient is not nervous/anxious.        Objective:   Physical Exam Vitals:   06/27/23 0914  BP: 126/84  Pulse: 97  Temp: 97.9 F (36.6 C)  TempSrc: Oral  SpO2: 97%  Weight: 221 lb 9.6 oz (100.5 kg)  Height: 5\' 6"  (1.676 m)   Gen: Pleasant, overwt woman, in no distress,  normal affect  ENT: No lesions,  mouth  clear,  oropharynx clear, no postnasal drip  Neck: No JVD, no stridor  Lungs: No use of accessory muscles, no wheeze, decreased at bases  Cardiovascular: RRR, heart sounds normal, no murmur or gallops, no peripheral edema  Musculoskeletal: No deformities, no cyanosis or clubbing  Neuro: alert, non focal  Skin: Warm, no lesions or rash      Assessment & Plan:  COPD (chronic obstructive pulmonary disease) (HCC) Keep your albuterol available to use 2 puffs when needed for shortness of breath, chest tightness, wheezing. We will hold off on starting any scheduled inhaler medication at this time but we could consider going forward depending on how your breathing is doing Flu shot is up-to-date  Allergic rhinitis Continue your Singulair, Xyzal, Flonase nasal spray as you have been taking them  Upper airway resistance syndrome Continue to treat GERD, rhinitis as aggressively as possible since these contribute to her upper airway irritability  GERD Continue omeprazole  History of tobacco use Get your repeat lung cancer screening CT chest next September as planned Follow Dr. Delton Coombes in September 2025 after your CT chest so we can review that study.   Levy Pupa, MD, PhD 06/27/2023, 5:09 PM [facility] [phone] or if no answer [phone]

## 2023-07-16 ENCOUNTER — Other Ambulatory Visit: Payer: Self-pay | Admitting: *Deleted

## 2023-07-16 DIAGNOSIS — Z79899 Other long term (current) drug therapy: Secondary | ICD-10-CM | POA: Diagnosis not present

## 2023-07-17 LAB — CMP14+EGFR
ALT: 25 [IU]/L (ref 0–32)
AST: 28 [IU]/L (ref 0–40)
Albumin: 4.1 g/dL (ref 3.8–4.9)
Alkaline Phosphatase: 61 [IU]/L (ref 44–121)
BUN/Creatinine Ratio: 14 (ref 9–23)
BUN: 13 mg/dL (ref 6–24)
Bilirubin Total: 0.5 mg/dL (ref 0.0–1.2)
CO2: 24 mmol/L (ref 20–29)
Calcium: 9.3 mg/dL (ref 8.7–10.2)
Chloride: 103 mmol/L (ref 96–106)
Creatinine, Ser: 0.94 mg/dL (ref 0.57–1.00)
Globulin, Total: 2.7 g/dL (ref 1.5–4.5)
Glucose: 108 mg/dL — ABNORMAL HIGH (ref 70–99)
Potassium: 4.6 mmol/L (ref 3.5–5.2)
Sodium: 142 mmol/L (ref 134–144)
Total Protein: 6.8 g/dL (ref 6.0–8.5)
eGFR: 71 mL/min/{1.73_m2} (ref >59)

## 2023-07-17 LAB — CBC WITH DIFFERENTIAL/PLATELET
Basophils Absolute: 0 10*3/uL (ref 0.0–0.2)
Basos: 0 %
EOS (ABSOLUTE): 0.1 10*3/uL (ref 0.0–0.4)
Eos: 1 %
Hematocrit: 39 % (ref 34.0–46.6)
Hemoglobin: 13.2 g/dL (ref 11.1–15.9)
Immature Grans (Abs): 0 10*3/uL (ref 0.0–0.1)
Immature Granulocytes: 0 %
Lymphocytes Absolute: 3 10*3/uL (ref 0.7–3.1)
Lymphs: 42 %
MCH: 32.5 pg (ref 26.6–33.0)
MCHC: 33.8 g/dL (ref 31.5–35.7)
MCV: 96 fL (ref 79–97)
Monocytes Absolute: 0.6 10*3/uL (ref 0.1–0.9)
Monocytes: 8 %
Neutrophils Absolute: 3.5 10*3/uL (ref 1.4–7.0)
Neutrophils: 49 %
Platelets: 285 10*3/uL (ref 150–450)
RBC: 4.06 x10E6/uL (ref 3.77–5.28)
RDW: 12.4 % (ref 11.7–15.4)
WBC: 7.2 10*3/uL (ref 3.4–10.8)

## 2023-07-19 ENCOUNTER — Other Ambulatory Visit (HOSPITAL_COMMUNITY): Payer: Self-pay

## 2023-07-19 ENCOUNTER — Encounter: Payer: Self-pay | Admitting: Rheumatology

## 2023-07-19 ENCOUNTER — Telehealth: Payer: Self-pay | Admitting: Pharmacist

## 2023-07-19 ENCOUNTER — Encounter: Payer: Self-pay | Admitting: Gastroenterology

## 2023-07-19 ENCOUNTER — Other Ambulatory Visit: Payer: Self-pay

## 2023-07-19 MED ORDER — MESALAMINE ER 0.375 G PO CP24: 750.0000 mg | ORAL_CAPSULE | Freq: Every day | ORAL | 3 refills | Status: DC

## 2023-07-19 NOTE — Telephone Encounter (Signed)
 PA submitted for Xeljanz. Will run test claim if approved. If covered with monthly copay cap through Medicare Rx Payment Plan then she may be able to stay on Xeljanz

## 2023-07-19 NOTE — Telephone Encounter (Signed)
 I called the patient to clarify some details--she has enough xeljanz  to make it through the rest of the month.   She is enrolled in medicare's new program and her max OOP is $2,000--according to the patient she has already been setup to pay in 22-monthly installments. Discussed that if she would like to switch to rinvoq  we can obtain consent at upcoming visit scheduled with Dr. Dolphus on 08/08/23.

## 2023-07-19 NOTE — Telephone Encounter (Signed)
 Patient states she does not qualify for Pf[employer] patient assistance in 2025 for her Xeljanz . Submitted a Prior Authorization request to [employer] for XELJANZ  via CoverMyMeds. Will update once we receive a response.  Key: [id number]  If approved, please run test claim. Ideally we'd be able to keep patient on treatment. She states she's enrolled into Medicare Rx Payment Plan  Sherry Pennant, PharmD, MPH, BCPS, CPP Clinical Pharmacist (Rheumatology and Pulmonology)

## 2023-07-20 NOTE — Telephone Encounter (Addendum)
 Received fax from [facility] DENIAL of Xeljanz  was NOT overturned. Denial remains in place for same reason.  Patient must try and fail, unable to take, or likely to lead to treatment failure with following: Rinvoq , Skyrizi, Tremfya  I will hold off on pursuing next appeal level since patient stated to me this morning that she was having breakthrough flares  Sherry Pennant, PharmD, MPH, BCPS, CPP Clinical Pharmacist (Rheumatology and Pulmonology)

## 2023-07-20 NOTE — Telephone Encounter (Signed)
 Patient called office, patient requested a return call.  Discussed that if she would like to switch to rinvoq we can obtain consent at upcoming visit scheduled with Dr. Corliss Skains on 08/08/23.

## 2023-07-20 NOTE — Telephone Encounter (Signed)
 Patient advised that we are working on Xeljanz  appeal because if medication is clinically effective, we ideally do not want to change medication. She states she doesn't want to stay on Xeljanz  anyways at this point due to breakthrough flares. Appeal has already been submitted so reverse that out but if Xeljanz  is approved, she may be able to stay on treatment.

## 2023-07-20 NOTE — Telephone Encounter (Signed)
 Submitted an URGENT appeal to [employer] for XELJANZ .  Phone: [phone] Fax: [phone]  ATTN: [employer] [address] [address] [place], [place] [address]  Sherry Pennant, PharmD, MPH, BCPS, CPP Clinical Pharmacist (Rheumatology and Pulmonology)

## 2023-07-20 NOTE — Telephone Encounter (Signed)
 Received a fax regarding Prior Authorization from [employer] for XELJANZ . Authorization has been DENIED because preferred drugs you have not tried are: Rinvoq , [employer], Candido.  Can file an appeal to request exception since this is continuaton of treatment and ideally therapy would not be changed if effective  Phone: [phone] Fax [phone] ATTN: [facility] [employer] [address] [address] [place] [address]

## 2023-07-23 NOTE — Telephone Encounter (Signed)
 [name] appeal denied. Will likely have to move forward with Rinvoq

## 2023-07-24 ENCOUNTER — Other Ambulatory Visit: Payer: Self-pay | Admitting: *Deleted

## 2023-07-24 MED ORDER — METHOTREXATE SODIUM CHEMO INJECTION 50 MG/2ML: INTRAMUSCULAR | 0 refills | Status: DC

## 2023-07-24 NOTE — Telephone Encounter (Signed)
 Refill request received via fax from [facility] for Methotrexate    Last Fill: 05/14/2023  Labs: 07/16/2023 CBC and CMP WNL   Next Visit: 08/08/2023  Last Visit: 03/20/2023  DX:  Psoriatic arthritis   Current Dose per office note 03/20/2023: methotrexate  0.6 ml sq once weekly   Okay to refill Methotrexate ?

## 2023-07-25 NOTE — Progress Notes (Signed)
Office Visit Note  Patient: [name]             Date of Birth: [birth year]           MRN: [id number]             PCP: Lupita Raider, MD Referring: Lupita Raider, MD Visit Date: 08/08/2023 Occupation: @GUAROCC @  Subjective:  Medication management  History of Present Illness: [name] is a 58 y.o. female with psoriatic arthritis, psoriasis and ulcerative colitis.  She returns today for the follow-up visit after March 20, 2023.  She has not had a flare of psoriatic arthritis or ulcerative colitis.  She gets occasional rash on her foot.  She states her insurance is declining coverage on [place].  Rinvoq is approved by her insurance.  She states Dr. Lavon Paganini had a detailed discussion with the patient regarding different treatment options and would like to switch her to Rinvoq.  She would like [place] to switch patient to Rinvoq from [place].  She has been taking Xeljanz 5 mg p.o. twice daily along with methotrexate 0.6 mL subcu weekly and folic acid without any interruption.  She denies any side effects of the medications.  She has not had any blood in her stool.  She denies any history of Achilles tendinitis, plantar fasciitis, dactylitis or uveitis.  She denies any increased shortness of breath.  She continues to have mild Raynaud's symptoms.    Activities of Daily Living:  Patient reports morning stiffness for 30 minutes.   Patient Denies nocturnal pain.  Difficulty dressing/grooming: Denies Difficulty climbing stairs: Reports Difficulty getting out of chair: Denies Difficulty using hands for taps, buttons, cutlery, and/or writing: Reports  Review of Systems  Constitutional:  Positive for fatigue.  HENT:  Negative for mouth sores, mouth dryness and nose dryness.   Eyes:  Negative for pain and dryness.  Respiratory:  Positive for shortness of breath. Negative for difficulty breathing.        On exertion   Cardiovascular:  Negative for chest pain and palpitations.   Gastrointestinal:  Positive for constipation and diarrhea. Negative for blood in stool.  Endocrine: Negative for increased urination.  Genitourinary:  Negative for painful urination and involuntary urination.  Musculoskeletal:  Positive for morning stiffness. Negative for joint pain, gait problem, joint pain, joint swelling, myalgias, muscle weakness, muscle tenderness and myalgias.  Skin:  Positive for color change, rash and hair loss. Negative for sensitivity to sunlight.  Allergic/Immunologic: Negative for susceptible to infections.  Neurological:  Negative for dizziness and headaches.  Hematological:  Negative for swollen glands.  Psychiatric/Behavioral:  Negative for depressed mood and sleep disturbance. The patient is not nervous/anxious.     PMFS History:  Patient Active Problem List   Diagnosis Date Noted   Upper airway resistance syndrome 11/02/2022   Coronary artery disease involving native coronary artery of native heart without angina pectoris 06/14/2020   Aortic atherosclerosis (HCC) 05/06/2020   Family history of bicuspid aortic valve 05/06/2020   Vitamin D deficiency 12/24/2019   Depression 12/24/2019   History of tobacco use 08/06/2019   Allergic rhinitis 04/19/2018   Obstructive sleep apnea syndrome, mild 04/19/2018   COPD (chronic obstructive pulmonary disease) (HCC) 03/26/2018   Rectal bleeding 01/22/2018   LLQ abdominal pain 01/22/2018   Dysphagia 01/12/2017   Spondylosis of lumbar region without myelopathy or radiculopathy 08/11/2016   Primary osteoarthritis of both knees 08/11/2016   History of ulcerative colitis 08/11/2016   History of IBS 08/11/2016  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   High risk medications (not anticoagulants) long-term use 05/13/2016   Knee pain, bilateral 05/13/2016   Anserine bursitis 05/13/2016   Chondromalacia patellae, left knee 05/13/2016   Chondromalacia patellae, right knee 05/13/2016   Psoriatic arthritis (HCC) 05/12/2016    UNSPECIFIED HYPOTHYROIDISM 08/24/2009   Essential hypertension 08/24/2009   GERD 08/24/2009   Ulcerative colitis (HCC) 08/24/2009   IRRITABLE BOWEL SYNDROME 08/24/2009   OVARIAN CYST 08/24/2009   PSORIASIS 08/24/2009   ARTHRITIS 08/24/2009   PANCREATITIS, HX OF 08/24/2009   FIBROCYSTIC BREAST DISEASE, HX OF 08/24/2009    Past Medical History:  Diagnosis Date   Allergy    Anxiety    Arthritis    Asthma    Back pain    Cataract    both eyes  surgically repaired   COPD (chronic obstructive pulmonary disease) (HCC)    Depression    treated   Dyspnea    Fibrocystic breast disease    GERD (gastroesophageal reflux disease)    History of hiatal hernia    repaired   History of kidney stones    questionable   Hyperlipidemia    Hypertension    pt denies   Hypothyroidism    Irritable bowel syndrome    Joint pain    Osteoarthritis    Ovarian cyst    Palpitations    Pancreatitis    PONV (postoperative nausea and vomiting)    NO TROUBLE WITH CATARACT SURGERY   Psoriasis    Psoriatic arthritis (HCC) 05/12/2016   Poor response to Humira Inadequate response to Enbrel Inadequate response to Simponi   Shortness of breath    Tachycardia    Ulcerative colitis     Family History  Problem Relation Age of Onset   Breast cancer Mother 16   Heart disease Mother    Colon polyps Mother    Other Mother        pre-cancerous tumor removed   Colon cancer Mother    Hypertension Mother    Hyperlipidemia Mother    Cancer Mother    Depression Mother    Heart disease Father    Hypertension Father    Hyperlipidemia Father    Stroke Father    Alcohol abuse Father    Neuropathy Father    Crohn's disease Sister    Alcoholism Sister    Breast cancer Paternal Aunt    Breast cancer Maternal Grandmother    Colon polyps Paternal Grandmother    Crohn's disease Daughter    Irritable bowel syndrome Other    Esophageal cancer Neg Hx    Rectal cancer Neg Hx    Stomach cancer Neg Hx     Pancreatic cancer Neg Hx    Past Surgical History:  Procedure Laterality Date   ABDOMINAL HYSTERECTOMY     ANKLE RECONSTRUCTION     left   bladder tuck     CARPAL TUNNEL RELEASE     CATARACT EXTRACTION W/PHACO Right 09/11/2017   Procedure: CATARACT EXTRACTION PHACO AND INTRAOCULAR LENS PLACEMENT (IOC);  Surgeon: Galen Manila, MD;  Location: [facility];  Service: Ophthalmology;  Laterality: Right;  [place] 00:18.5 AP% 6.9 CDE 1.29 Fluid Pack Lot # [id number]   CATARACT EXTRACTION W/PHACO Left 10/02/2017   Procedure: CATARACT EXTRACTION PHACO AND INTRAOCULAR LENS PLACEMENT (IOC);  Surgeon: Galen Manila, MD;  Location: [facility];  Service: Ophthalmology;  Laterality: Left;  [place] 00:25.9 AP% 6.9 CDE 1.80 Fluid Pack Lot # [id number]   CLOSED REDUCTION NASAL FRACTURE N/A  05/21/2018   Procedure: CLOSED REDUCTION NASAL FRACTURE;  Surgeon: Geanie Logan, MD;  Location: [facility];  Service: ENT;  Laterality: N/A;  Latex sensitivity   COLONOSCOPY     EYE SURGERY     Lasik   FRACTURE SURGERY     KNEE SURGERY     left   LAPAROSCOPIC NISSEN FUNDOPLICATION N/A 01/12/2017   Procedure: LAPAROSCOPIC NISSEN TAKEDOWN AND UPPER ENDOSCOPY;  Surgeon: Luretha Ferdinand, MD;  Location: [facility];  Service: General;  Laterality: N/A;   LEFT HEART CATH AND CORONARY ANGIOGRAPHY N/A 05/10/2020   Procedure: LEFT HEART CATH AND CORONARY ANGIOGRAPHY;  Surgeon: Lyn Records, MD;  Location: [facility];  Service: Cardiovascular;  Laterality: N/A;   NASAL SEPTUM SURGERY     NISSEN FUNDOPLICATION     PARTIAL HYSTERECTOMY     POLYPECTOMY     repair cystocele and rectocele     thyroid ablation     TUBAL LIGATION     UPPER GASTROINTESTINAL ENDOSCOPY     UPPER GI ENDOSCOPY  01/12/2017   Procedure: UPPER GI ENDOSCOPY;  Surgeon: Luretha Edman, MD;  Location: [facility];  Service: General;;   Social History   Social History Narrative   Not on file   Immunization History  Administered Date(s)  Administered   Influenza Inj Mdck Quad Pf 05/08/2017   Influenza,inj,Quad PF,6+ Mos 03/08/2018, 03/29/2019, 06/25/2019, 03/31/2020, 04/14/2021   Influenza-Unspecified 03/31/2020   PFIZER Comirnaty(Gray Top)Covid-19 Tri-Sucrose Vaccine 09/20/2020   PFIZER(Purple Top)SARS-COV-2 Vaccination 09/28/2019, 10/20/2019, 03/02/2020   Zoster Recombinant(Shingrix) 09/06/2020, 02/28/2021     Objective: Vital Signs: BP 123/82 (BP Location: Left Arm, Patient Position: Sitting, Cuff Size: Normal)   Pulse 76   Resp 16   Ht 5\' 6"  (1.676 m)   Wt 215 lb 6.4 oz (97.7 kg)   BMI 34.77 kg/m    Physical Exam Vitals and nursing note reviewed.  Constitutional:      Appearance: She is well-developed.  HENT:     Head: Normocephalic and atraumatic.  Eyes:     Conjunctiva/sclera: Conjunctivae normal.  Cardiovascular:     Rate and Rhythm: Normal rate and regular rhythm.     Heart sounds: Normal heart sounds.  Pulmonary:     Effort: Pulmonary effort is normal.     Breath sounds: Normal breath sounds.  Abdominal:     General: Bowel sounds are normal.     Palpations: Abdomen is soft.  Musculoskeletal:     Cervical back: Normal range of motion.  Lymphadenopathy:     Cervical: No cervical adenopathy.  Skin:    General: Skin is warm and dry.     Capillary Refill: Capillary refill takes less than 2 seconds.  Neurological:     Mental Status: She is alert and oriented to person, place, and time.  Psychiatric:        Behavior: Behavior normal.      Musculoskeletal Exam: Cervical, thoracic and lumbar spine were in good range of motion.  She had no tenderness on palpation over SI joints.  Shoulders, elbows, wrist joints, MCPs PIPs and DIPs Juengel range of motion with no synovitis.  PIP and DIP thickening was noted with no synovitis.  Hip joints and knee joints in good range of motion without any warmth swelling or effusion.  There was no tenderness over ankles or MTPs.  There was no Achilles tendinitis or  plantar fasciitis.  CDAI Exam: CDAI Score: -- Patient Global: --; Provider Global: -- Swollen: --; Tender: -- Joint  Exam 08/08/2023   No joint exam has been documented for this visit   There is currently no information documented on the homunculus. Go to the Rheumatology activity and complete the homunculus joint exam.  Investigation: No additional findings.  Imaging: No results found.  Recent Labs: Lab Results  Component Value Date   WBC 7.2 07/16/2023   HGB 13.2 07/16/2023   PLT 285 07/16/2023   NA 142 07/16/2023   K 4.6 07/16/2023   CL 103 07/16/2023   CO2 24 07/16/2023   GLUCOSE 108 (H) 07/16/2023   BUN 13 07/16/2023   CREATININE 0.94 07/16/2023   BILITOT 0.5 07/16/2023   ALKPHOS 61 07/16/2023   AST 28 07/16/2023   ALT 25 07/16/2023   PROT 6.8 07/16/2023   ALBUMIN 4.1 07/16/2023   CALCIUM 9.3 07/16/2023   GFRAA 86 08/10/2020   QFTBGOLDPLUS Negative 10/16/2022    Speciality Comments: Inadequate response to Enbrel, Humira, Simponi,  Remicade-allergic response  Procedures:  No procedures performed Allergies: Demerol [meperidine], Imuran [azathioprine], Meperidine hcl, Remicade [infliximab], Sulfasalazine, Avelox [moxifloxacin], Doxycycline, Erythromycin, and Latex   Assessment / Plan:     Visit Diagnoses: Psoriatic arthritis (HCC) - Intermittent joint pain and chronic left SI joint pain.  Patient has done well on Xeljanz 5 mg p.o. twice daily with methotrexate 0.6 mL subcu weekly.  She has not had any flares in a long time.  Patient would like to switch from [place] to Rinvoq as it is not covered by her insurance.  She also spoke with her gastroenterologist who recommended switching to Rinvoq.  Indications side effects contraindications of Rinvoq including the increased risk of Mace events were discussed at length.  Patient wants to proceed with Rinvoq.  She was advised to discontinue Xeljanz once Rinvoq was approved.  Based on her psoriatic arthritis dose we will  start her on Rinvoq 15 mg p.o. daily.  Counseled patient that Rinvoq is a JAK inhibitor indicated for Rheumatoid Arthritis.  Counseled patient on purpose, proper use, and adverse effects of Rinvoq.    Reviewed the most common adverse effects including infection, diarrhea, headaches.  Also reviewed rare adverse effects such as bowel injury and the need to contact us if they develop stomach pain during treatment. Counseled on the increase risk of venous thrombosis. Counseled about FDA black box warning of MACE (major adverse CV events including cardiovascular death, myocardial infarction, and stroke).  Reviewed with patient that there is the possibility of an increased risk of malignancy specifically lung cancer and lymphomas but it is not well understood if this increased risk is due to the medication or the disease state. Instructed patient that medication should be held for infection and prior to surgery.  Advised patient to avoid live vaccines. Recommend annual influenza, PCV 15 or PCV20 or Pneumovax 23, and Shingrix as indicated.    Reviewed importance of routine lab monitoring including lipid panel.  Will recheck lipid panel 3 months after starting and annually thereafter. CBC and CMP will be monitored routinely every 3 months. Standing orders placed. Provided patient with medication education material and answered all questions.  Patient consented to Rinvoq.  Will upload into patient's chart.  Will apply through patient's insurance and update when we receive a response.    Patient dose will be 15 mg daily.  Prescription will be sent to pharmacy pending lab results and insurance approval.   Psoriasis -she gets intermittent flares of pustular psoriasis on the plantar surface of her feet.  High risk medication use -  Xeljanz 5 mg 1 tablet by mouth twice daily, methotrexate 0.6 ml sq once weekly, folic acid 2 mg po daily.  TB Gold - October 16, 2022.  Lipid panel 03/20/23.  Labs from July 16, 2023 CBC and  CMP were normal.  TB Gold was negative on October 16, 2022.  We will recheck labs in a month after starting Rinvoq and then every 3 months.  Information on immunization was placed in the AVS.  She was advised to hold Rinvoq and methotrexate if she develops an infection and resume after the infection resolves.  FDA blackbox warning associated with Rinvoq regarding adverse cardiovascular events, thrombosis, mortality, serious infections and lymphoma were also reviewed.  Increased risk of thrombosis causing DVT, pulmonary embolism and arterial thrombosis were also discussed.  Raynaud's phenomenon without gangrene-she has some mild symptoms during the winter months.  S/p bilateral carpal tunnel release - By Dr. Mina Marble May 2023  Primary osteoarthritis of both knees-she continues to have intermittent discomfort.  No warmth swelling or effusion was noted.  Achilles tendonitis, bilateral-patient denies any recent flares.  Lumbar spondylosis-she has intermittent discomfort.  She had no tenderness on the examination today.  History of ulcerative colitis - Followed by Dr. Lavon Paganini.  Patient had colonoscopy in June 2024 patient states Dr. Lavon Paganini recommended switching to Rinvoq.  The medical problems listed as follows:  History of gastroesophageal reflux (GERD)  History of IBS  History of hypertension-blood pressure was normal today at 123/82.  Elevated hemoglobin A1c  History of hyperlipidemia  Vitamin D deficiency  Numbness in feet  Former smoker  Orders: No orders of the defined types were placed in this encounter.  No orders of the defined types were placed in this encounter.   Follow-Up Instructions: Return in about 5 months (around 01/06/2024) for Psoriatic arthritis.   Pollyann Savoy, MD  Note - This record has been created using Animal nutritionist.  Chart creation errors have been sought, but may not always  have been located. Such creation errors do not reflect on  the  standard of medical care.

## 2023-08-08 ENCOUNTER — Ambulatory Visit: Payer: Medicare HMO | Attending: Rheumatology | Admitting: Rheumatology

## 2023-08-08 ENCOUNTER — Encounter: Payer: Self-pay | Admitting: Rheumatology

## 2023-08-08 VITALS — BP 123/82 | HR 76 | Resp 16 | Ht 66.0 in | Wt 215.4 lb

## 2023-08-08 DIAGNOSIS — I7300 Raynaud's syndrome without gangrene: Secondary | ICD-10-CM | POA: Diagnosis not present

## 2023-08-08 DIAGNOSIS — M47816 Spondylosis without myelopathy or radiculopathy, lumbar region: Secondary | ICD-10-CM

## 2023-08-08 DIAGNOSIS — M7661 Achilles tendinitis, right leg: Secondary | ICD-10-CM | POA: Diagnosis not present

## 2023-08-08 DIAGNOSIS — L409 Psoriasis, unspecified: Secondary | ICD-10-CM | POA: Diagnosis not present

## 2023-08-08 DIAGNOSIS — Z87891 Personal history of nicotine dependence: Secondary | ICD-10-CM

## 2023-08-08 DIAGNOSIS — Z79899 Other long term (current) drug therapy: Secondary | ICD-10-CM

## 2023-08-08 DIAGNOSIS — R200 Anesthesia of skin: Secondary | ICD-10-CM

## 2023-08-08 DIAGNOSIS — Z9889 Other specified postprocedural states: Secondary | ICD-10-CM | POA: Diagnosis not present

## 2023-08-08 DIAGNOSIS — M170 Bilateral primary osteoarthritis of knee: Secondary | ICD-10-CM

## 2023-08-08 DIAGNOSIS — Z8719 Personal history of other diseases of the digestive system: Secondary | ICD-10-CM

## 2023-08-08 DIAGNOSIS — L4050 Arthropathic psoriasis, unspecified: Secondary | ICD-10-CM | POA: Diagnosis not present

## 2023-08-08 DIAGNOSIS — Z8639 Personal history of other endocrine, nutritional and metabolic disease: Secondary | ICD-10-CM

## 2023-08-08 DIAGNOSIS — R7309 Other abnormal glucose: Secondary | ICD-10-CM

## 2023-08-08 DIAGNOSIS — Z8679 Personal history of other diseases of the circulatory system: Secondary | ICD-10-CM

## 2023-08-08 DIAGNOSIS — M7662 Achilles tendinitis, left leg: Secondary | ICD-10-CM

## 2023-08-08 DIAGNOSIS — E559 Vitamin D deficiency, unspecified: Secondary | ICD-10-CM

## 2023-08-08 NOTE — Patient Instructions (Addendum)
Upadacitinib Extended-Release Tablets What is this medication? UPADACITINIB (ue PAD a SYE ti nib) treats autoimmune conditions, such as arthritis, eczema, and ulcerative colitis. It is often used when other medications have not worked well enough or cannot be tolerated. It works by slowing down an overactive immune system. This decreases inflammation. This medicine may be used for other purposes; ask your health care provider or pharmacist if you have questions. COMMON BRAND NAME(S): RINVOQ What should I tell my care team before I take this medication? They need to know if you have any of these conditions: Cancer Current or past tobacco use Diabetes Have had a heart attack or stroke Have had blood clots Have been in close contact with someone who has tuberculosis (TB) Heart disease High blood pressure High cholesterol HIV or AIDS Infection or have had an infection that does not go away, such as tuberculosis (TB), shingles, hepatitis, herpes Kidney disease Live or have traveled to the [place] or the [place] or [place] Liver disease Low blood cell levels (white cells, red cells, and platelets) Lung or breathing disease, such as asthma or COPD Recent or upcoming vaccine Stomach or intestine problems Taking NSAIDs, medications for pain and inflammation, such as ibuprofen or naproxen Taking steroid medications, such as prednisone or cortisone Weakened immune system An unusual or allergic reaction to upadacitinib, other medications, foods, dyes, or preservatives Pregnant or trying to get pregnant Breastfeeding How should I use this medication? Take this medication by mouth with water. Take it as directed on the prescription label at the same time every day. Do not cut, crush, or chew this medication. Swallow the tablets whole. You can take it with or without food. If it upsets your stomach, take it with food. Keep taking it unless your care team tells you to stop. Do  not take this medication with grapefruit juice. A special MedGuide will be given to you by the pharmacist with each prescription and refill. Be sure to read this information carefully each time. Talk to your care team about the use of this medication in children. While it may be prescribed for children as young as 2 years for selected conditions, precautions do apply. Overdosage: If you think you have taken too much of this medicine contact a poison control center or emergency room at once. NOTE: This medicine is only for you. Do not share this medicine with others. What if I miss a dose? If you miss a dose, take it as soon as you can. If it is almost time for your next dose, take only that dose. Do not take double or extra doses. What may interact with this medication? Certain antivirals for HIV or hepatitis Certain medications for fungal infections, such as ketoconazole, itraconazole, posaconazole, voriconazole Certain medications for seizures, such as carbamazepine, phenobarbital, phenytoin Clarithromycin Grapefruit and grapefruit juice Live virus vaccines Medications that lower your chance of fighting infection, such as abatacept, adalimumab, azathioprine, cyclosporine, etanercept, infliximab, rituximab Rifampin Supplements, such as St. John's wort Other medications may affect the way this medication works. Talk with your care team about all of the medications you take. They may suggest changes to your treatment plan to lower the risk of side effects and to make sure your medications work as intended. This list may not describe all possible interactions. Give your health care provider a list of all the medicines, herbs, non-prescription drugs, or dietary supplements you use. Also tell them if you smoke, drink alcohol, or use illegal drugs. Some items  may interact with your medicine. What should I watch for while using this medication? Visit your care team for regular checks on your progress.  Tell your care team if your symptoms do not start to get better or if they get worse. You may need blood work done while you are taking this medication. This medication may increase your risk of getting an infection. Call your care team for advice if you get a fever, chills, sore throat, or other symptoms of a cold or flu. Do not treat yourself. Try to avoid being around people who are sick. Your care team will screen you for tuberculosis (TB) before you start this medication. If they think you are at risk, you may be treated with medication for TB. You should start taking the medication for TB before you start this medication. Make sure to finish the full course of TB medication. Talk to your care team about your vaccination history. To lower your risk of infection, you may need certain vaccines before you start this medication. Talk to your care team about your risk of cancer. You may be more at risk for certain types of cancer if you take this medication. Tobacco use may increase your risk of cancer. Talk to your care team about having your skin checked for cancer while taking this medication. Limit the amount of time you spend in the sun. Wear protective clothing and sunscreen when you are in the sun. Do not use sun lamps, tanning beds, or tanning booths. This medication may increase the risk of blood clots, heart attack, stroke, or death. High blood pressure, high cholesterol, diabetes, increased age, excess weight, and tobacco use increase this risk. Call emergency services right away if you have symptoms of a heart attack or stroke. This medication can increase bad cholesterol and fats (such as LDL, triglycerides) and decrease good cholesterol (HDL) in your blood. You may need blood tests to check your cholesterol. Ask your care team what you can do to lower your risk of high cholesterol while taking this medication. Tell your care team right away if you have any change in your eyesight. Talk to your  care team if you often see part of the tablet in your stool. Talk to your care team if you may be pregnant. Serious birth defects can occur if you take this medication during pregnancy and for 4 weeks after the last dose. You will need a negative pregnancy test before starting this medication. Contraception is recommended while taking this medication and for 4 weeks after the last dose. Your care team can help you find the option that works for you. Do not breastfeed while taking this medication and for 6 days after the last dose. What side effects may I notice from receiving this medication? Side effects that you should report to your care team as soon as possible: Allergic reactions--skin rash, itching, hives, swelling of the face, lips, tongue, or throat Blood clot--pain, swelling, or warmth in the leg, shortness of breath, chest pain Change in vision Heart attack--pain or tightness in the chest, shoulders, arms, or jaw, nausea, shortness of breath, cold or clammy skin, feeling faint or lightheaded Infection--fever, chills, cough, sore throat, wounds that don't heal, pain or trouble when passing urine, general feeling of discomfort or being unwell Liver injury--right upper belly pain, loss of appetite, nausea, light-colored stool, dark yellow or brown urine, yellowing skin or eyes, unusual weakness or fatigue Low red blood cell level--unusual weakness or fatigue, dizziness, headache, trouble breathing  Stomach pain that is severe, does not go away, or gets worse Stroke--sudden numbness or weakness of the face, arm, or leg, trouble speaking, confusion, trouble walking, loss of balance or coordination, dizziness, severe headache, change in vision Side effects that usually do not require medical attention (report these to your care team if they continue or are bothersome): Acne Cough Headache Nausea Runny or stuffy nose This list may not describe all possible side effects. Call your doctor for  medical advice about side effects. You may report side effects to FDA at 1-800-FDA-1088. Where should I keep my medication? Keep out of the reach of children and pets. Store at room temperature between 20 and 25 degrees C (68 and 77 degrees F). Protect from moisture. Keep the container tightly closed. Keep this medication in the original container until you are ready to take it. Get rid of any unused medication after the expiration date. To get rid of medications that are no longer needed or have expired: Take the medication to a medication take-back program. Check with your pharmacy or law enforcement to find a location. If you cannot return the medication, check the label or package insert to see if the medication should be thrown out in the garbage or flushed down the toilet. If you are not sure, ask your care team. If it is safe to put it in the trash, empty the medication out of the container. Mix the medication with cat litter, dirt, coffee grounds, or other unwanted substance. Seal the mixture in a bag or container. Put it in the trash. NOTE: This sheet is a summary. It may not cover all possible information. If you have questions about this medicine, talk to your doctor, pharmacist, or health care provider.  2024 Elsevier/Gold Standard (2023-06-15 00:00:00)                                           Standing Labs We placed an order today for your standing lab work.   Please have your standing labs drawn in 1 month after starting Rinvoq and every 3 months  Please have your labs drawn 2 weeks prior to your appointment so that the provider can discuss your lab results at your appointment, if possible.  Please note that you may see your imaging and lab results in MyChart before we have reviewed them. We will contact you once all results are reviewed. Please allow our office up to 72 hours to thoroughly review all of the results before contacting the office for clarification of your  results.  WALK-IN LAB HOURS  Monday through Thursday from 8:00 am -12:30 pm and 1:00 pm-5:00 pm and Friday from 8:00 am-12:00 pm.  Patients with office visits requiring labs will be seen before walk-in labs.  You may encounter longer than normal wait times. Please allow additional time. Wait times may be shorter on  Monday and Thursday afternoons.  We do not book appointments for walk-in labs. We appreciate your patience and understanding with our staff.   Labs are drawn by [facility]. Please bring your co-pay at the time of your lab draw.  You may receive a bill from [facility] for your lab work.  Please note if you are on Hydroxychloroquine and and an order has been placed for a Hydroxychloroquine level,  you will need to have it drawn 4 hours or more after your last dose.  If you wish to have your labs drawn at another location, please call the office 24 hours in advance so we can fax the orders.  The office is located at [address], [address], [place], [place] [address]   If you have any questions regarding directions or hours of operation,  please call [phone].   As a reminder, please drink plenty of water prior to coming for your lab work. Thanks!   Vaccines You are taking a medication(s) that can suppress your immune system.  The following immunizations are recommended: Flu annually Covid-19 RSV  Td/Tdap (tetanus, diphtheria, pertussis) every 10 years Pneumonia (Prevnar 15 then Pneumovax 23 at least 1 year apart.  Alternatively, can take Prevnar 20 without needing additional dose) Shingrix: 2 doses from 4 weeks to 6 months apart  Please check with your PCP to make sure you are up to date.   If you have signs or symptoms of an infection or start antibiotics: First, call your PCP for workup of your infection. Hold your medication through the infection, until you complete your antibiotics, and until symptoms resolve if you take the following: Injectable medication (Actemra,  Benlysta, Cimzia, Cosentyx, Enbrel, Humira, Kevzara, Orencia, Remicade, Simponi, Stelara, Taltz, Tremfya) Methotrexate Leflunomide (Arava) Mycophenolate (Cellcept) Osborne [place], or Rinvoq   Because you are taking [name], Rinvoq, or Olumiant, it is very important to know that this class of medications has a FDA BLACK BOX WARNING for major adverse cardiovascular events (MACE), thrombosis, mortality (including sudden cardiovascular death), serious infections, and lymphomas. MACE is defined as cardiovascular death, myocardial infarction, and stroke. Thrombosis includes deep venous thrombosis (DVT), pulmonary embolism (PE), and arterial thrombosis. If you are a current or former smoker, you are at higher risk for MACE.

## 2023-08-09 ENCOUNTER — Telehealth: Payer: Self-pay | Admitting: Pharmacist

## 2023-08-09 DIAGNOSIS — Z79899 Other long term (current) drug therapy: Secondary | ICD-10-CM

## 2023-08-09 DIAGNOSIS — L4050 Arthropathic psoriasis, unspecified: Secondary | ICD-10-CM

## 2023-08-09 DIAGNOSIS — L409 Psoriasis, unspecified: Secondary | ICD-10-CM

## 2023-08-09 NOTE — Telephone Encounter (Addendum)
Submitted a Prior Authorization request to [facility] for RINVOQ via CoverMyMeds. Will update once we receive a response.  Key: [id number]   ----- Message from CMA Marissa G sent at 08/08/2023  1:53 PM EST ----- Please apply for Rinvoq, per Dr. Corliss Skains. Consent was obtained and sent to the scan center. Patient states her insurance covers the medication at a Tier 5 with a PA completed and she is already enrolled in the $2,000 Medicare prescription plan. Thanks!

## 2023-08-17 ENCOUNTER — Other Ambulatory Visit (HOSPITAL_COMMUNITY): Payer: Self-pay

## 2023-08-17 ENCOUNTER — Other Ambulatory Visit: Payer: Self-pay | Admitting: Pharmacist

## 2023-08-17 MED ORDER — RINVOQ 15 MG PO TB24: 15.0000 mg | ORAL_TABLET | Freq: Every day | ORAL | 0 refills | Status: DC

## 2023-08-17 MED ORDER — RINVOQ 15 MG PO TB24
15.0000 mg | ORAL_TABLET | Freq: Every day | ORAL | 0 refills | Status: DC
Start: 2023-08-17 — End: 2023-08-23

## 2023-08-17 NOTE — Telephone Encounter (Addendum)
Received notification from [facility] regarding a prior authorization for [facility]. Authorization has been APPROVED from 08/17/2023 to 07/16/24. Approval letter sent to scan center.  Per test claim, copay for 30 days supply is $534.03. Patient is enrolled into Medicare Prescription Payment Plan  Patient can fill through [facility]: [phone] . Patient prefers to fill through [facility]  Called patient to advise. She will f/u with pharmacy  Chesley Mires, PharmD, MPH, BCPS, CPP Clinical Pharmacist (Rheumatology and Pulmonology)

## 2023-08-21 ENCOUNTER — Encounter: Payer: Self-pay | Admitting: Rheumatology

## 2023-08-21 DIAGNOSIS — Z79899 Other long term (current) drug therapy: Secondary | ICD-10-CM

## 2023-08-21 DIAGNOSIS — L4050 Arthropathic psoriasis, unspecified: Secondary | ICD-10-CM

## 2023-08-21 DIAGNOSIS — L409 Psoriasis, unspecified: Secondary | ICD-10-CM

## 2023-08-23 ENCOUNTER — Other Ambulatory Visit: Payer: Self-pay

## 2023-08-23 ENCOUNTER — Other Ambulatory Visit (HOSPITAL_COMMUNITY): Payer: Self-pay

## 2023-08-23 MED ORDER — RINVOQ 15 MG PO TB24
15.0000 mg | ORAL_TABLET | Freq: Every day | ORAL | 0 refills | Status: DC
  Filled 2023-08-29: qty 30, 30d supply, fill #0
  Filled 2023-09-18: qty 30, 30d supply, fill #1
  Filled 2023-10-16 (×2): qty 30, 30d supply, fill #2

## 2023-08-29 ENCOUNTER — Other Ambulatory Visit: Payer: Self-pay

## 2023-08-29 ENCOUNTER — Other Ambulatory Visit (HOSPITAL_COMMUNITY): Payer: Self-pay

## 2023-08-29 NOTE — Progress Notes (Signed)
Specialty Pharmacy Initial Fill Coordination Note  [name] is a 58 y.o. female contacted today regarding initial fill of specialty medication(s) Upadacitinib (Rinvoq)   Patient requested Delivery   Delivery date: 08/31/23   Verified address: [address]   [place] [address]   Medication will be filled on 08/30/23.   Patient is enrolled in MPPP and is aware of $0 copayment.

## 2023-08-30 ENCOUNTER — Other Ambulatory Visit: Payer: Self-pay

## 2023-09-04 ENCOUNTER — Other Ambulatory Visit: Payer: Self-pay | Admitting: Family Medicine

## 2023-09-04 DIAGNOSIS — Z1231 Encounter for screening mammogram for malignant neoplasm of breast: Secondary | ICD-10-CM

## 2023-09-17 ENCOUNTER — Encounter: Payer: Self-pay | Admitting: Rheumatology

## 2023-09-17 ENCOUNTER — Other Ambulatory Visit: Payer: Self-pay | Admitting: *Deleted

## 2023-09-17 DIAGNOSIS — Z79899 Other long term (current) drug therapy: Secondary | ICD-10-CM

## 2023-09-18 ENCOUNTER — Other Ambulatory Visit: Payer: Self-pay

## 2023-09-18 ENCOUNTER — Other Ambulatory Visit (HOSPITAL_COMMUNITY): Payer: Self-pay

## 2023-09-18 LAB — CBC WITH DIFFERENTIAL/PLATELET
Basophils Absolute: 0 10*3/uL (ref 0.0–0.2)
Basos: 0 %
EOS (ABSOLUTE): 0.1 10*3/uL (ref 0.0–0.4)
Eos: 1 %
Hematocrit: 39.8 % (ref 34.0–46.6)
Hemoglobin: 13.3 g/dL (ref 11.1–15.9)
Immature Grans (Abs): 0 10*3/uL (ref 0.0–0.1)
Immature Granulocytes: 0 %
Lymphocytes Absolute: 2 10*3/uL (ref 0.7–3.1)
Lymphs: 30 %
MCH: 32.5 pg (ref 26.6–33.0)
MCHC: 33.4 g/dL (ref 31.5–35.7)
MCV: 97 fL (ref 79–97)
Monocytes Absolute: 0.6 10*3/uL (ref 0.1–0.9)
Monocytes: 10 %
Neutrophils Absolute: 3.9 10*3/uL (ref 1.4–7.0)
Neutrophils: 59 %
Platelets: 293 10*3/uL (ref 150–450)
RBC: 4.09 x10E6/uL (ref 3.77–5.28)
RDW: 12.2 % (ref 11.7–15.4)
WBC: 6.6 10*3/uL (ref 3.4–10.8)

## 2023-09-18 LAB — CMP14+EGFR
ALT: 27 IU/L (ref 0–32)
AST: 29 IU/L (ref 0–40)
Albumin: 4.3 g/dL (ref 3.8–4.9)
Alkaline Phosphatase: 52 IU/L (ref 44–121)
BUN/Creatinine Ratio: 18 (ref 9–23)
BUN: 16 mg/dL (ref 6–24)
Bilirubin Total: 0.7 mg/dL (ref 0.0–1.2)
CO2: 23 mmol/L (ref 20–29)
Calcium: 9.7 mg/dL (ref 8.7–10.2)
Chloride: 101 mmol/L (ref 96–106)
Creatinine, Ser: 0.88 mg/dL (ref 0.57–1.00)
Globulin, Total: 2.9 g/dL (ref 1.5–4.5)
Glucose: 100 mg/dL — ABNORMAL HIGH (ref 70–99)
Potassium: 4.8 mmol/L (ref 3.5–5.2)
Sodium: 138 mmol/L (ref 134–144)
Total Protein: 7.2 g/dL (ref 6.0–8.5)
eGFR: 76 mL/min/{1.73_m2} (ref >59)

## 2023-09-18 NOTE — Progress Notes (Signed)
 Specialty Pharmacy Refill Coordination Note  [name] is a 58 y.o. female contacted today regarding refills of specialty medication(s) Rinvoq.  Patient requested (Patient-Rptd) Delivery   Delivery date: (Patient-Rptd) 09/24/23   Verified address: (Patient-Rptd) [address]   [place]. [place]. [address]   Medication will be filled on 09/21/23.

## 2023-09-18 NOTE — Progress Notes (Signed)
CBC and CMP were normal.

## 2023-09-20 ENCOUNTER — Other Ambulatory Visit: Payer: Self-pay

## 2023-09-20 NOTE — Progress Notes (Signed)
 Refill too soon on insurance, will ship 3/10.  Patient was notified.

## 2023-09-27 ENCOUNTER — Ambulatory Visit
Admission: RE | Admit: 2023-09-27 | Discharge: 2023-09-27 | Disposition: A | Discharge status: Home / Self Care | Payer: Medicare HMO | Source: Ambulatory Visit | Attending: Family Medicine | Admitting: Family Medicine

## 2023-09-27 DIAGNOSIS — Z1231 Encounter for screening mammogram for malignant neoplasm of breast: Secondary | ICD-10-CM

## 2023-10-03 ENCOUNTER — Ambulatory Visit: Payer: Medicare HMO | Admitting: Gastroenterology

## 2023-10-03 ENCOUNTER — Encounter: Payer: Self-pay | Admitting: Gastroenterology

## 2023-10-03 VITALS — BP 110/68 | HR 80 | Ht 65.5 in

## 2023-10-03 DIAGNOSIS — K5100 Ulcerative (chronic) pancolitis without complications: Secondary | ICD-10-CM

## 2023-10-03 DIAGNOSIS — K5190 Ulcerative colitis, unspecified, without complications: Secondary | ICD-10-CM | POA: Diagnosis not present

## 2023-10-03 NOTE — Patient Instructions (Addendum)
 VISIT SUMMARY:  ANNALYCIA DONE, a 58 year old female with psoriatic arthritis and ulcerative colitis, came in for a follow-up visit. Her ulcerative colitis is stable, and her psoriatic arthritis and psoriasis are well-managed with her current medication regimen.  YOUR PLAN:  -ULCERATIVE COLITIS: Ulcerative colitis is a chronic condition that causes inflammation in the colon. Your condition is well-controlled with Rinvoq, and we will discontinue mesalamine to reduce your medication burden. We will monitor your fecal calprotectin levels close to wintertime to ensure there is no inflammation. Please avoid frequent use of Excedrin as it can increase the risk of flares.  -PSORIATIC ARTHRITIS: Psoriatic arthritis is a type of arthritis that affects some people with psoriasis. Your condition is well-controlled with Rinvoq, and you are considering discontinuing methotrexate to reduce your medication burden. Please discuss this with Dr. Corliss Skains.    INSTRUCTIONS:  Please schedule a follow-up appointment in six months. Call in three months to schedule the appointment.  I appreciate the  opportunity to care for you  Thank You   Marsa Aris , MD

## 2023-10-03 NOTE — Progress Notes (Unsigned)
 [name]    [id number]    02-03-1966  Primary Care Physician:Shaw, Rockney Ghee, MD  Referring Physician: Lupita Raider, MD [address] [place],  [place] [address]   Chief complaint: Ulcerative colitis  Discussed the use of AI scribe software for clinical note transcription with the patient, who gave verbal consent to proceed.  History of Present Illness   [name] "[name]" is a 58 year old female with psoriatic arthritis and ulcerative colitis who presents for a follow-up visit.  She has ulcerative colitis, which has been stable on mesalamine. Her last fecal calprotectin test in November was normal, indicating no active inflammation. She recalls a previous severe flare but has been stable since. She rarely uses Excedrin for headaches due to its aspirin content, which can increase the risk of flares.  She has psoriatic arthritis and switched from [place] to Rinvoq, which she takes at a dose of 15 mg. She notes significant improvement in her symptoms with Rinvoq, stating it works better for her psoriasis and is cheaper with fewer side effects, including a lower risk of blood clots. She is currently on methotrexate for psoriatic arthritis but is contemplating discontinuing it due to the effectiveness of Rinvoq. She has not experienced any liver issues while on methotrexate and wants to reduce her medication burden, including discontinuing the enema and mesalamine.      GI history: Ulcerative colitis diagnosed in 2002, colonoscopy 2011 showed pancolitis.  Colonoscopy December 2014 showed chronic active colitis from 0 to 30 cm and normal transverse and right colon. Colonoscopy Nov 14, 2017 negative for active colitis   Colonoscopy 02/23/20 - The perianal and digital rectal examinations were normal. - A 5 mm polyp [tubular adenoma] was found in the transverse colon. The polyp was sessile. The polyp was removed with a cold snare. Resection and retrieval  were complete. - A localized area of mildly nodular mucosa was found in the rectum. Biopsies were taken with a cold forceps for histology. - Non-bleeding internal hemorrhoids were found during retroflexion. The hemorrhoids were medium-sized. - The exam was otherwise without abnormality.   Colonoscopy January 05, 2023: - The examined portion of the ileum was normal. - Mild ( Mayo Score 1) left- sided ulcerative colitis. Biopsied, showed mild to moderate chronic active colitis. - Diverticulosis in the sigmoid colon. - Non- bleeding internal hemorrhoids.   Chronic GERD status post Nissen fundoplication EGD February 2011 negative for Barrett's esophagus   Outpatient Encounter Medications as of 10/03/2023  Medication Sig   albuterol (PROVENTIL) (2.5 MG/3ML) 0.083% nebulizer solution Take 3 mLs (2.5 mg total) by nebulization every 6 (six) hours as needed for wheezing or shortness of breath.   albuterol (VENTOLIN HFA) 108 (90 Base) MCG/ACT inhaler Inhale 2 puffs into the lungs every 6 (six) hours as needed for wheezing or shortness of breath.   aspirin-acetaminophen-caffeine (EXCEDRIN MIGRAINE) 250-250-65 MG tablet Take 2 tablets by mouth every 8 (eight) hours as needed for headache.    Cholecalciferol (VITAMIN D3) 50 MCG (2000 UT) CAPS Take 2,000 Units by mouth.   clobetasol ointment (TEMOVATE) 0.05 % Apply 1 application topically 2 (two) times daily.   dicyclomine (BENTYL) 20 MG tablet Take 1 tablet (20 mg total) by mouth as needed.   estradiol (ESTRACE) 1 MG tablet Take 1 mg by mouth daily.   fluticasone (FLONASE) 50 MCG/ACT nasal spray Place 1 spray into both nostrils daily.   folic acid (FOLVITE)  1 MG tablet Take 2 mg by mouth daily.   furosemide (LASIX) 20 MG tablet Take 1 tablet (20 mg total) by mouth daily as needed (LOWER LEG EDEMA).   levocetirizine (XYZAL) 5 MG tablet Take 5 mg by mouth every evening.   levothyroxine (SYNTHROID) 125 MCG tablet Take 125 mcg by mouth every other day.  Alternate with 137 mcg   levothyroxine (SYNTHROID) 137 MCG tablet Take 137 mcg by mouth every other day. Alternate with 125 mcg   metoprolol succinate (TOPROL-XL) 50 MG 24 hr tablet Take 25 mg by mouth daily. Take with or immediately following a meal.   montelukast (SINGULAIR) 10 MG tablet Take 10 mg by mouth at bedtime.   omeprazole (PRILOSEC) 20 MG capsule TAKE 1 CAPSULE EVERY DAY BEFORE BREAKFAST   rosuvastatin (CRESTOR) 20 MG tablet Take 20 mg by mouth daily.   Tuberculin-Allergy Syringes 27G X 1/2" 1 ML KIT Inject 1 Syringe into the skin once a week. To be used with weekly methotrexate injections   Upadacitinib ER (RINVOQ) 15 MG TB24 Take 1 tablet (15 mg total) by mouth daily.   WEGOVY 1.7 MG/0.75ML SOAJ Inject 1.7 mg into the skin once a week.   [DISCONTINUED] mesalamine (APRISO) 0.375 g 24 hr capsule Take 2 capsules (0.75 g total) by mouth daily.   [DISCONTINUED] mesalamine (ROWASA) 4 g enema Place 60 mLs (4 g total) rectally at bedtime. Lay on left side, retain as long as able   [DISCONTINUED] methotrexate 50 MG/2ML injection INJECT 0.6ML UNDER THE SKIN ONE TIME WEEKLY. DISCARD VIAL 28 DAYS AFTER FIRST USE   [DISCONTINUED] WEGOVY 0.25 MG/0.5ML SOAJ Inject 0.25 mg into the skin once a week. (Patient not taking: Reported on 08/08/2023)   [DISCONTINUED] WEGOVY 1 MG/0.5ML SOAJ    No facility-administered encounter medications on file as of 10/03/2023.    Allergies as of 10/03/2023 - Review Complete 10/03/2023  Allergen Reaction Noted   Demerol [meperidine] Hives 10/15/2019   Imuran [azathioprine] Other (See Comments) 08/24/2009   Meperidine hcl Hives 08/24/2009   Remicade [infliximab] Hives and Other (See Comments) 01/26/2011   Sulfasalazine Swelling and Hives 08/04/2011   Avelox [moxifloxacin] Rash 08/24/2009   Doxycycline Rash    Erythromycin Rash 08/24/2009   Latex Rash 08/24/2009    Past Medical History:  Diagnosis Date   Allergy    Anxiety    Arthritis    Asthma    Back  pain    Cataract    both eyes  surgically repaired   COPD (chronic obstructive pulmonary disease) (HCC)    Depression    treated   Dyspnea    Fibrocystic breast disease    GERD (gastroesophageal reflux disease)    History of hiatal hernia    repaired   History of kidney stones    questionable   Hyperlipidemia    Hypertension    pt denies   Hypothyroidism    Irritable bowel syndrome    Joint pain    Osteoarthritis    Ovarian cyst    Palpitations    Pancreatitis    PONV (postoperative nausea and vomiting)    NO TROUBLE WITH CATARACT SURGERY   Psoriasis    Psoriatic arthritis (HCC) 05/12/2016   Poor response to Humira Inadequate response to Enbrel Inadequate response to Simponi   Shortness of breath    Tachycardia    Ulcerative colitis     Past Surgical History:  Procedure Laterality Date   ABDOMINAL HYSTERECTOMY     ANKLE RECONSTRUCTION  left   bladder tuck     CARPAL TUNNEL RELEASE     CATARACT EXTRACTION W/PHACO Right 09/11/2017   Procedure: CATARACT EXTRACTION PHACO AND INTRAOCULAR LENS PLACEMENT (IOC);  Surgeon: Galen Manila, MD;  Location: [facility];  Service: Ophthalmology;  Laterality: Right;  [place] 00:18.5 AP% 6.9 CDE 1.29 Fluid Pack Lot # [id number]   CATARACT EXTRACTION W/PHACO Left 10/02/2017   Procedure: CATARACT EXTRACTION PHACO AND INTRAOCULAR LENS PLACEMENT (IOC);  Surgeon: Galen Manila, MD;  Location: [facility];  Service: Ophthalmology;  Laterality: Left;  [place] 00:25.9 AP% 6.9 CDE 1.80 Fluid Pack Lot # [id number]   CLOSED REDUCTION NASAL FRACTURE N/A 05/21/2018   Procedure: CLOSED REDUCTION NASAL FRACTURE;  Surgeon: Geanie Logan, MD;  Location: [facility];  Service: ENT;  Laterality: N/A;  Latex sensitivity   COLONOSCOPY     EYE SURGERY     Lasik   FRACTURE SURGERY     KNEE SURGERY     left   LAPAROSCOPIC NISSEN FUNDOPLICATION N/A 01/12/2017   Procedure: LAPAROSCOPIC NISSEN TAKEDOWN AND UPPER ENDOSCOPY;  Surgeon: Luretha Isidore,  MD;  Location: [facility];  Service: General;  Laterality: N/A;   LEFT HEART CATH AND CORONARY ANGIOGRAPHY N/A 05/10/2020   Procedure: LEFT HEART CATH AND CORONARY ANGIOGRAPHY;  Surgeon: Lyn Records, MD;  Location: [facility];  Service: Cardiovascular;  Laterality: N/A;   NASAL SEPTUM SURGERY     NISSEN FUNDOPLICATION     PARTIAL HYSTERECTOMY     POLYPECTOMY     repair cystocele and rectocele     thyroid ablation     TUBAL LIGATION     UPPER GASTROINTESTINAL ENDOSCOPY     UPPER GI ENDOSCOPY  01/12/2017   Procedure: UPPER GI ENDOSCOPY;  Surgeon: Luretha Holcomb, MD;  Location: [facility];  Service: General;;    Family History  Problem Relation Age of Onset   Breast cancer Mother 46   Heart disease Mother    Colon polyps Mother    Other Mother        pre-cancerous tumor removed   Colon cancer Mother    Hypertension Mother    Hyperlipidemia Mother    Cancer Mother    Depression Mother    Heart disease Father    Hypertension Father    Hyperlipidemia Father    Stroke Father    Alcohol abuse Father    Neuropathy Father    Crohn's disease Sister    Alcoholism Sister    Breast cancer Paternal Aunt    Breast cancer Maternal Grandmother    Colon polyps Paternal Grandmother    Crohn's disease Daughter    Irritable bowel syndrome Other    Esophageal cancer Neg Hx    Rectal cancer Neg Hx    Stomach cancer Neg Hx    Pancreatic cancer Neg Hx     Social History   Socioeconomic History   Marital status: Married    Spouse name: Not on file   Number of children: Not on file   Years of education: Not on file   Highest education level: Not on file  Occupational History   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, 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    Employer: [employer]   Occupation: disabled  Tobacco Use   Smoking status: Former    Current packs/day: 0.00    Average packs/day: 1 pack/day for 37.5 years (37.5 ttl pk-yrs)    Types: Cigarettes    Start date: 01/26/1972    Quit date: 07/27/2009  Years  since quitting: 14.1    Passive exposure: Never   Smokeless tobacco: Never  Vaping Use   Vaping status: Never Used  Substance and Sexual Activity   Alcohol use: Yes    Alcohol/week: 0.0 standard drinks of alcohol    Comment:  occasionally (1x/mo)   Drug use: No   Sexual activity: Yes  Other Topics Concern   Not on file  Social History Narrative   Not on file   Social Drivers of Health   Financial Resource Strain: Not on file  Food Insecurity: Not on file  Transportation Needs: Not on file  Physical Activity: Not on file  Stress: Not on file  Social Connections: Not on file  Intimate Partner Violence: Not on file      Review of systems: All other review of systems negative except as mentioned in the HPI.   Physical Exam: Vitals:   10/03/23 1454  BP: 110/68  Pulse: 80   Body mass index is 35.3 kg/m. Gen:      No acute distress HEENT:  sclera anicteric Neuro: alert and oriented x 3 Psych: normal mood and affect  Data Reviewed:  Reviewed labs, radiology imaging, old records and pertinent past GI work up     Assessment and Plan    Ulcerative Colitis Ulcerative colitis is well-controlled with Rinvoq. Previous flare symptoms have significantly improved. Rinvoq is preferred for its efficacy, cost-effectiveness, and lower thrombotic risk compared to [place]. Discontinuation of mesalamine is considered as inflammation is well-managed with Rinvoq, reducing medication burden and potential side effects. Monitoring fecal calprotectin levels will assess inflammation status. - Discontinue mesalamine - Monitor fecal calprotectin levels once a year, as long as symptoms are stable - Advise against frequent use of Excedrin due to risk of flares from high-dose aspirin  Psoriatic Arthritis Psoriatic arthritis is well-controlled with Rinvoq, which has shown better efficacy and fewer side effects compared to previous treatments. She is considering discontinuing methotrexate to  reduce medication burden and potential hepatotoxicity, pending discussion with rheumatologist Dr. Corliss Skains. Rinvoq's effectiveness for psoriatic arthritis supports this consideration. - Discuss discontinuation of methotrexate with Dr. Corliss Skains  Psoriasis Psoriasis is well-managed with Rinvoq, showing better results compared to previous treatments. She reports satisfaction with the current regimen, noting fewer side effects and improved symptom control.  Follow-up She is doing well on the current treatment regimen. Plans to follow up in six months to reassess her condition and medication regimen. Scheduling challenges were discussed, and a plan to call in three months to schedule the appointment was advised. - Schedule follow-up appointment in six months - Advise her to call in three months to schedule the appointment     The patient was provided an opportunity to ask questions and all were answered. The patient agreed with the plan and demonstrated an understanding of the instructions.  Iona Beard , MD    CC: Lupita Raider, MD

## 2023-10-04 ENCOUNTER — Encounter: Payer: Self-pay | Admitting: Gastroenterology

## 2023-10-04 ENCOUNTER — Other Ambulatory Visit: Payer: Self-pay | Admitting: Physician Assistant

## 2023-10-04 NOTE — Telephone Encounter (Signed)
 Last Fill: 07/24/2023  Labs: 09/17/2023 CBC and CMP were normal.   Next Visit: 11/06/2023  Last Visit: 08/08/2023  DX: Psoriatic arthritis (HCC)   Current Dose per office note 08/08/2023: methotrexate 0.6 ml sq once weekly   Okay to refill Methotrexate?

## 2023-10-16 ENCOUNTER — Other Ambulatory Visit: Payer: Self-pay

## 2023-10-16 ENCOUNTER — Other Ambulatory Visit (HOSPITAL_COMMUNITY): Payer: Self-pay

## 2023-10-16 NOTE — Progress Notes (Signed)
 Specialty Pharmacy Refill Coordination Note  [name] is a 58 y.o. female contacted today regarding refills of specialty medication(s) No data recorded  Patient requested (Patient-Rptd) Delivery   Delivery date: (Patient-Rptd) 10/25/23   Verified address: (Patient-Rptd) [address]  [place]   Medication will be filled on 10/24/23.

## 2023-10-23 NOTE — Progress Notes (Signed)
 Office Visit Note  Patient: [name]             Date of Birth: 1966/02/08           MRN: [id number]             PCP: Glena Landau, MD Referring: Glena Landau, MD Visit Date: 11/06/2023 Occupation: @GUAROCC @  Subjective:  Medication monitoring  History of Present Illness: [name] is a 58 y.o. female with history of psoriatic arthritis and osteoarthritis.  Patient remains on Rinvoq  15 mg by mouth daily, methotrexate  0.6 ml sq once weekly, folic acid  2 mg po daily.  She is tolerating Rinvoq  without any side effects and has not had any recent gaps in therapy.  She has noticed about a 50% improvement in her psoriasis since switching from Xeljanz  to Rinvoq .  She has also had less joint stiffness and arthralgias.  She has intermittent discomfort in the left SI joint as well as ongoing pain in the left [facility] joint and left first PIP joint but denies any joint swelling.  She denies any recent UC flares.  She denies any blood in the stool.  She has not had any recent or recurrent infections. Overall her symptoms have been significantly better controlled while taking Rinvoq .  She would like to consider reducing the dose of methotrexate .    Activities of Daily Living:  Patient reports morning stiffness for 20-30 minutes.   Patient Denies nocturnal pain.  Difficulty dressing/grooming: Reports Difficulty climbing stairs: Reports Difficulty getting out of chair: Denies Difficulty using hands for taps, buttons, cutlery, and/or writing: Denies  Review of Systems  Constitutional:  Positive for fatigue.  HENT:  Negative for mouth sores and mouth dryness.   Eyes:  Negative for dryness.  Respiratory:  Negative for shortness of breath.   Cardiovascular:  Negative for chest pain and palpitations.  Gastrointestinal:  Positive for constipation and diarrhea. Negative for blood in stool.  Endocrine: Negative for increased urination.  Genitourinary:  Negative for involuntary urination.   Musculoskeletal:  Positive for joint pain, joint pain, morning stiffness and muscle tenderness. Negative for gait problem, joint swelling, myalgias, muscle weakness and myalgias.  Skin:  Positive for hair loss. Negative for color change, rash and sensitivity to sunlight.  Allergic/Immunologic: Negative for susceptible to infections.  Neurological:  Negative for dizziness and headaches.  Hematological:  Negative for swollen glands.  Psychiatric/Behavioral:  Negative for depressed mood and sleep disturbance. The patient is not nervous/anxious.     PMFS History:  Patient Active Problem List   Diagnosis Date Noted   Upper airway resistance syndrome 11/02/2022   Coronary artery disease involving native coronary artery of native heart without angina pectoris 06/14/2020   Aortic atherosclerosis (HCC) 05/06/2020   Family history of bicuspid aortic valve 05/06/2020   Vitamin D  deficiency 12/24/2019   Depression 12/24/2019   History of tobacco use 08/06/2019   Allergic rhinitis 04/19/2018   Obstructive sleep apnea syndrome, mild 04/19/2018   COPD (chronic obstructive pulmonary disease) (HCC) 03/26/2018   Rectal bleeding 01/22/2018   LLQ abdominal pain 01/22/2018   Dysphagia 01/12/2017   Spondylosis of lumbar region without myelopathy or radiculopathy 08/11/2016   Primary osteoarthritis of both knees 08/11/2016   History of ulcerative colitis 08/11/2016   History of IBS 08/11/2016   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   High risk medications (not anticoagulants) long-term use 05/13/2016   Knee pain, bilateral 05/13/2016   Anserine bursitis 05/13/2016   Chondromalacia patellae, left knee 05/13/2016  Chondromalacia patellae, right knee 05/13/2016   Psoriatic arthritis (HCC) 05/12/2016   UNSPECIFIED HYPOTHYROIDISM 08/24/2009   Essential hypertension 08/24/2009   GERD 08/24/2009   Ulcerative colitis (HCC) 08/24/2009   IRRITABLE BOWEL SYNDROME 08/24/2009   OVARIAN CYST 08/24/2009   PSORIASIS  08/24/2009   ARTHRITIS 08/24/2009   PANCREATITIS, HX OF 08/24/2009   FIBROCYSTIC BREAST DISEASE, HX OF 08/24/2009    Past Medical History:  Diagnosis Date   Allergy     Anxiety    Arthritis    Asthma    Back pain    Cataract    both eyes  surgically repaired   COPD (chronic obstructive pulmonary disease) (HCC)    Depression    treated   Dyspnea    Fibrocystic breast disease    GERD (gastroesophageal reflux disease)    History of hiatal hernia    repaired   History of kidney stones    questionable   Hyperlipidemia    Hypertension    pt denies   Hypothyroidism    Irritable bowel syndrome    Joint pain    Osteoarthritis    Ovarian cyst    Palpitations    Pancreatitis    PONV (postoperative nausea and vomiting)    NO TROUBLE WITH CATARACT SURGERY   Psoriasis    Psoriatic arthritis (HCC) 05/12/2016   Poor response to Humira Inadequate response to Enbrel Inadequate response to Simponi   Shortness of breath    Tachycardia    Ulcerative colitis     Family History  Problem Relation Age of Onset   Breast cancer Mother 30   Heart disease Mother    Colon polyps Mother    Other Mother        pre-cancerous tumor removed   Colon cancer Mother    Hypertension Mother    Hyperlipidemia Mother    Cancer Mother    Depression Mother    Heart disease Father    Hypertension Father    Hyperlipidemia Father    Stroke Father    Alcohol abuse Father    Neuropathy Father    Crohn's disease Sister    Alcoholism Sister    Breast cancer Paternal Aunt    Breast cancer Maternal Grandmother    Colon polyps Paternal Grandmother    Crohn's disease Daughter    Irritable bowel syndrome Other    Esophageal cancer Neg Hx    Rectal cancer Neg Hx    Stomach cancer Neg Hx    Pancreatic cancer Neg Hx    Past Surgical History:  Procedure Laterality Date   ABDOMINAL HYSTERECTOMY     ANKLE RECONSTRUCTION     left   bladder tuck     CARPAL TUNNEL RELEASE     CATARACT EXTRACTION W/PHACO  Right 09/11/2017   Procedure: CATARACT EXTRACTION PHACO AND INTRAOCULAR LENS PLACEMENT (IOC);  Surgeon: Clair Crews, MD;  Location: [facility];  Service: Ophthalmology;  Laterality: Right;  US  00:18.5 AP% 6.9 CDE 1.29 Fluid Pack Lot # [id number]   CATARACT EXTRACTION W/PHACO Left 10/02/2017   Procedure: CATARACT EXTRACTION PHACO AND INTRAOCULAR LENS PLACEMENT (IOC);  Surgeon: Clair Crews, MD;  Location: [facility];  Service: Ophthalmology;  Laterality: Left;  US  00:25.9 AP% 6.9 CDE 1.80 Fluid Pack Lot # [id number] H   CLOSED REDUCTION NASAL FRACTURE N/A 05/21/2018   Procedure: CLOSED REDUCTION NASAL FRACTURE;  Surgeon: Von Grumbling, MD;  Location: [facility];  Service: ENT;  Laterality: N/A;  Latex sensitivity   COLONOSCOPY  EYE SURGERY     Lasik   FRACTURE SURGERY     KNEE SURGERY     left   LAPAROSCOPIC NISSEN FUNDOPLICATION N/A 01/12/2017   Procedure: LAPAROSCOPIC NISSEN TAKEDOWN AND UPPER ENDOSCOPY;  Surgeon: Jacolyn Matar, MD;  Location: [facility];  Service: General;  Laterality: N/A;   LEFT HEART CATH AND CORONARY ANGIOGRAPHY N/A 05/10/2020   Procedure: LEFT HEART CATH AND CORONARY ANGIOGRAPHY;  Surgeon: Arty Binning, MD;  Location: [facility];  Service: Cardiovascular;  Laterality: N/A;   NASAL SEPTUM SURGERY     NISSEN FUNDOPLICATION     PARTIAL HYSTERECTOMY     POLYPECTOMY     repair cystocele and rectocele     thyroid  ablation     TUBAL LIGATION     UPPER GASTROINTESTINAL ENDOSCOPY     UPPER GI ENDOSCOPY  01/12/2017   Procedure: UPPER GI ENDOSCOPY;  Surgeon: Jacolyn Matar, MD;  Location: [facility];  Service: General;;   Social History   Social History Narrative   Not on file   Immunization History  Administered Date(s) Administered   Influenza Inj Mdck Quad Pf 05/08/2017   Influenza,inj,Quad PF,6+ Mos 03/08/2018, 03/29/2019, 06/25/2019, 03/31/2020, 04/14/2021   Influenza-Unspecified 03/31/2020   PFIZER Comirnaty(Gray Top)Covid-19 Tri-Sucrose  Vaccine 09/20/2020   PFIZER(Purple Top)SARS-COV-2 Vaccination 09/28/2019, 10/20/2019, 03/02/2020   Zoster Recombinant(Shingrix) 09/06/2020, 02/28/2021     Objective: Vital Signs: BP 118/81 (BP Location: Left Arm, Patient Position: Sitting, Cuff Size: Large)   Pulse 75   Ht 5\' 6"  (1.676 m)   Wt 208 lb (94.3 kg)   BMI 33.57 kg/m    Physical Exam Vitals and nursing note reviewed.  Constitutional:      Appearance: She is well-developed.  HENT:     Head: Normocephalic and atraumatic.  Eyes:     Conjunctiva/sclera: Conjunctivae normal.  Cardiovascular:     Rate and Rhythm: Normal rate and regular rhythm.     Heart sounds: Normal heart sounds.  Pulmonary:     Effort: Pulmonary effort is normal.     Breath sounds: Normal breath sounds.  Abdominal:     General: Bowel sounds are normal.     Palpations: Abdomen is soft.  Musculoskeletal:     Cervical back: Normal range of motion.  Lymphadenopathy:     Cervical: No cervical adenopathy.  Skin:    General: Skin is warm and dry.     Capillary Refill: Capillary refill takes less than 2 seconds.  Neurological:     Mental Status: She is alert and oriented to person, place, and time.  Psychiatric:        Behavior: Behavior normal.      Musculoskeletal Exam: C-spine, thoracic spine, lumbar spine and good range of motion.  No midline spinal tenderness.  Mild tenderness of the left SI joint.  Shoulder joints, elbow joints, wrist joints, MCPs, PIPs, DIPs have good range of motion with no synovitis.  Complete fist formation bilaterally.  Tenderness at the left [facility] and left first PIP joint.  Complete fist formation noted.  Hip joints have good range of motion with no groin pain.  Knee joints have good range of motion with no warmth or effusion.  Ankle joints have good range of motion with no tenderness or joint swelling.  No evidence of Achilles tendinitis.  CDAI Exam: CDAI Score: -- Patient Global: --; Provider Global: -- Swollen: --;  Tender: -- Joint Exam 11/06/2023   No joint exam has been documented for this visit  There is currently no information documented on the homunculus. Go to the Rheumatology activity and complete the homunculus joint exam.  Investigation: No additional findings.  Imaging: No results found.   Recent Labs: Lab Results  Component Value Date   WBC 6.6 09/17/2023   HGB 13.3 09/17/2023   PLT 293 09/17/2023   NA 138 09/17/2023   K 4.8 09/17/2023   CL 101 09/17/2023   CO2 23 09/17/2023   GLUCOSE 100 (H) 09/17/2023   BUN 16 09/17/2023   CREATININE 0.88 09/17/2023   BILITOT 0.7 09/17/2023   ALKPHOS 52 09/17/2023   AST 29 09/17/2023   ALT 27 09/17/2023   PROT 7.2 09/17/2023   ALBUMIN 4.3 09/17/2023   CALCIUM 9.7 09/17/2023   GFRAA 86 08/10/2020   QFTBGOLDPLUS Negative 10/16/2022    Speciality Comments: Inadequate response to Enbrel, Humira, Simponi,  Remicade-allergic response  Procedures:  No procedures performed Allergies: Demerol [meperidine], Imuran [azathioprine], Meperidine hcl, Remicade [infliximab], Sulfasalazine, Avelox [moxifloxacin], Doxycycline, Erythromycin, and Latex   Assessment / Plan:     Visit Diagnoses: Psoriatic arthritis (HCC) - Intermittent joint pain and chronic left SI joint pain: She has no synovitis or dactylitis on examination today.  No evidence of Achilles tendinitis or plantar fasciitis.  The possible psoriasis on the soles of her feet has almost completely cleared.  She has noticed at least a 50% improvement in her symptoms since switching from Xeljanz  to Rinvoq .  She is currently taking Rinvoq  15 mg 1 tablet by mouth daily along with methotrexate  0.6 mg sq injections once weekly.  She has been tolerating combination therapy without any side effects and has not had any recent gaps in therapy.  No recent or recurrent infections.  Patient would like to try reducing the dose of methotrexate  gradually since she has had so much clinical benefit since  switching to Rinvoq .  Patient plans on trying methotrexate  0.5 mL sq injections once weekly for the next 2 months and if her symptoms remain stable she can try reducing to 0.4 mL sq injections once weekly.  She will notify us  if she has a recurrence of symptoms.  She will follow-up in the office in 5 months or sooner if needed.  Psoriasis: She has noticed at least a 50% improvement in skin clearance since switching from Xeljanz  to Rinvoq .  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  She will remain on Rinvoq  15 mg 1 tablet by mouth daily.  She will notify us  if she develops any new patches or signs of a flare.  High risk medication use - Rinvoq  15 mg 1 tablet by mouth once daily, methotrexate  0.5 ml sq once weekly, folic acid  2 mg po daily.  CBC and CMP were drawn on 09/17/2023.  Her next lab work will be due in June and every 3 months to monitor for drug toxicity.   TB gold negative on 10/16/22.  Order for TB gold released today. No recent or recurrent infections.  Discussed the importance of holding methotrexate  and rinvoq  if she develops signs or symptoms of an infection and to resume once the infection has completely cleared. - Plan: QuantiFERON-TB Gold Plus  Screening for tuberculosis -Order for TB gold released today.  Plan: QuantiFERON-TB Gold Plus  Raynaud's phenomenon without gangrene: Not currently symptomatic.  No digital ulcerations or signs of gangrene.   S/p bilateral carpal tunnel release - By Dr. Donzella Galley May 2023. Not currently symptomatic.   Primary osteoarthritis of both knees:  She has good range of motion of both knee joints on examination today.  No warmth or effusion noted.  Achilles tendonitis, bilateral: Not currently symptomatic.  Lumbar spondylosis: Intermittent discomfort in her lower back.  No midline spinal tenderness.  No symptoms of radiculopathy.  She has mild tenderness over the left SI joint today.  History of ulcerative colitis  - Followed by Dr. Leonia Raman.  Patient had colonoscopy in June 2024.  She has not had any signs or symptoms of an ulcerative colitis flare since switching from Xeljanz  to Rinvoq .  She is not currently experiencing any blood in the stool.  Patient will remain on Rinvoq  15 mg 1 tablet by mouth daily.  Other medical conditions are listed as follows:   History of gastroesophageal reflux (GERD)  History of IBS  History of hypertension: Blood pressure was 118/81 today in the office.  Elevated hemoglobin A1c  History of hyperlipidemia: Lipid panel within normal limits on 03/20/2023.  Vitamin D  deficiency   Orders: Orders Placed This Encounter  Procedures   QuantiFERON-TB Gold Plus   No orders of the defined types were placed in this encounter.    Follow-Up Instructions: Return in about 5 months (around 04/07/2024) for Psoriatic arthritis, Osteoarthritis.   Romayne Clubs, PA-C  Note - This record has been created using Dragon software.  Chart creation errors have been sought, but may not always  have been located. Such creation errors do not reflect on  the standard of medical care.

## 2023-11-06 ENCOUNTER — Encounter: Payer: Self-pay | Admitting: Physician Assistant

## 2023-11-06 ENCOUNTER — Ambulatory Visit: Payer: Medicare HMO | Attending: Physician Assistant | Admitting: Physician Assistant

## 2023-11-06 VITALS — BP 118/81 | HR 75 | Ht 66.0 in | Wt 208.0 lb

## 2023-11-06 DIAGNOSIS — I7300 Raynaud's syndrome without gangrene: Secondary | ICD-10-CM

## 2023-11-06 DIAGNOSIS — Z111 Encounter for screening for respiratory tuberculosis: Secondary | ICD-10-CM | POA: Diagnosis not present

## 2023-11-06 DIAGNOSIS — R7309 Other abnormal glucose: Secondary | ICD-10-CM

## 2023-11-06 DIAGNOSIS — M7661 Achilles tendinitis, right leg: Secondary | ICD-10-CM | POA: Diagnosis not present

## 2023-11-06 DIAGNOSIS — L409 Psoriasis, unspecified: Secondary | ICD-10-CM | POA: Diagnosis not present

## 2023-11-06 DIAGNOSIS — L4050 Arthropathic psoriasis, unspecified: Secondary | ICD-10-CM

## 2023-11-06 DIAGNOSIS — Z9889 Other specified postprocedural states: Secondary | ICD-10-CM | POA: Diagnosis not present

## 2023-11-06 DIAGNOSIS — Z8639 Personal history of other endocrine, nutritional and metabolic disease: Secondary | ICD-10-CM

## 2023-11-06 DIAGNOSIS — M170 Bilateral primary osteoarthritis of knee: Secondary | ICD-10-CM | POA: Diagnosis not present

## 2023-11-06 DIAGNOSIS — Z8679 Personal history of other diseases of the circulatory system: Secondary | ICD-10-CM

## 2023-11-06 DIAGNOSIS — E559 Vitamin D deficiency, unspecified: Secondary | ICD-10-CM

## 2023-11-06 DIAGNOSIS — Z79899 Other long term (current) drug therapy: Secondary | ICD-10-CM | POA: Diagnosis not present

## 2023-11-06 DIAGNOSIS — M7662 Achilles tendinitis, left leg: Secondary | ICD-10-CM

## 2023-11-06 DIAGNOSIS — M47816 Spondylosis without myelopathy or radiculopathy, lumbar region: Secondary | ICD-10-CM | POA: Diagnosis not present

## 2023-11-06 DIAGNOSIS — Z8719 Personal history of other diseases of the digestive system: Secondary | ICD-10-CM | POA: Diagnosis not present

## 2023-11-06 NOTE — Patient Instructions (Signed)

## 2023-11-08 LAB — QUANTIFERON-TB GOLD PLUS
Mitogen-NIL: 7.8 [IU]/mL
NIL: 0.02 [IU]/mL
QuantiFERON-TB Gold Plus: NEGATIVE
TB1-NIL: 0.01 [IU]/mL
TB2-NIL: 0 [IU]/mL

## 2023-11-08 NOTE — Progress Notes (Signed)
 TB gold negative

## 2023-11-14 ENCOUNTER — Other Ambulatory Visit (HOSPITAL_COMMUNITY): Payer: Self-pay

## 2023-11-16 ENCOUNTER — Other Ambulatory Visit: Payer: Self-pay | Admitting: Rheumatology

## 2023-11-16 ENCOUNTER — Other Ambulatory Visit: Payer: Self-pay

## 2023-11-16 DIAGNOSIS — Z79899 Other long term (current) drug therapy: Secondary | ICD-10-CM

## 2023-11-16 DIAGNOSIS — L4050 Arthropathic psoriasis, unspecified: Secondary | ICD-10-CM

## 2023-11-16 DIAGNOSIS — L409 Psoriasis, unspecified: Secondary | ICD-10-CM

## 2023-11-16 MED ORDER — RINVOQ 15 MG PO TB24
15.0000 mg | ORAL_TABLET | Freq: Every day | ORAL | 0 refills | Status: DC
  Filled 2023-11-16: qty 30, 30d supply, fill #0
  Filled 2023-12-21: qty 30, 30d supply, fill #1

## 2023-11-16 NOTE — Telephone Encounter (Signed)
 Last Fill: 08/23/2023  Labs: 09/17/2023 CBC and CMP were normal.   TB Gold: 11/06/2023   Next Visit: 04/17/2024  Last Visit: 11/06/2023  DX: Psoriatic arthritis    Current Dose per office note 11/06/2023: Rinvoq  15 mg 1 tablet by mouth once daily,   Okay to refill Rinvoq ?

## 2023-11-16 NOTE — Progress Notes (Signed)
 Specialty Pharmacy Refill Coordination Note  [name] is a 58 y.o. female contacted today regarding refills of specialty medication(s) Upadacitinib  (Rinvoq )   Patient requested (Patient-Rptd) Delivery   Delivery date: (Patient-Rptd) 11/26/23   Verified address: (Patient-Rptd) [address]   Medication will be filled on 11/23/23, pending refill approval.

## 2023-11-23 ENCOUNTER — Other Ambulatory Visit: Payer: Self-pay

## 2023-12-16 ENCOUNTER — Other Ambulatory Visit: Payer: Self-pay | Admitting: Rheumatology

## 2023-12-16 DIAGNOSIS — Z79899 Other long term (current) drug therapy: Secondary | ICD-10-CM

## 2023-12-17 NOTE — Telephone Encounter (Signed)
 Last Fill: 10/04/2023  Labs: 09/17/2023 CBC and CMP were normal.   Next Visit: 04/17/2024  Last Visit: 11/06/2023  DX: Psoriatic arthritis (HCC)   Current Dose per office note 11/06/2023: Patient plans on trying methotrexate  0.5 mL sq injections once weekly for the next 2 months and if her symptoms remain stable she can try reducing to 0.4 mL sq injections once weekly.   Contacted the patient and the patient states she is on 0.4 mL a week now. Patient states to go ahead and put future orders in for [facility] and she will call us  to release them when she can go. Advised the patient to call us  at least 24 hours before she would like to go.   Prescription changed to reflect 0.4 mL a week. Please change if needed.   Okay to refill Methotrexate ?

## 2023-12-21 ENCOUNTER — Other Ambulatory Visit: Payer: Self-pay

## 2023-12-21 NOTE — Progress Notes (Signed)
 Medications transferred out, patient is moving, disenrolling.

## 2023-12-31 DIAGNOSIS — H02846 Edema of left eye, unspecified eyelid: Secondary | ICD-10-CM | POA: Diagnosis not present

## 2024-01-08 ENCOUNTER — Other Ambulatory Visit: Payer: Medicare HMO

## 2024-01-15 ENCOUNTER — Other Ambulatory Visit: Payer: Self-pay | Admitting: *Deleted

## 2024-01-15 ENCOUNTER — Encounter: Payer: Self-pay | Admitting: Rheumatology

## 2024-01-15 DIAGNOSIS — Z79899 Other long term (current) drug therapy: Secondary | ICD-10-CM

## 2024-01-16 DIAGNOSIS — Z79899 Other long term (current) drug therapy: Secondary | ICD-10-CM | POA: Diagnosis not present

## 2024-01-17 ENCOUNTER — Ambulatory Visit: Payer: Self-pay | Admitting: Rheumatology

## 2024-01-17 LAB — CMP14+EGFR
ALT: 25 IU/L (ref 0–32)
AST: 28 IU/L (ref 0–40)
Albumin: 4 g/dL (ref 3.8–4.9)
Alkaline Phosphatase: 48 IU/L (ref 44–121)
BUN/Creatinine Ratio: 15 (ref 9–23)
BUN: 14 mg/dL (ref 6–24)
Bilirubin Total: 0.9 mg/dL (ref 0.0–1.2)
CO2: 22 mmol/L (ref 20–29)
Calcium: 9.6 mg/dL (ref 8.7–10.2)
Chloride: 101 mmol/L (ref 96–106)
Creatinine, Ser: 0.95 mg/dL (ref 0.57–1.00)
Globulin, Total: 2.8 g/dL (ref 1.5–4.5)
Glucose: 98 mg/dL (ref 70–99)
Potassium: 5.2 mmol/L (ref 3.5–5.2)
Sodium: 139 mmol/L (ref 134–144)
Total Protein: 6.8 g/dL (ref 6.0–8.5)
eGFR: 69 mL/min/{1.73_m2} (ref >59)

## 2024-01-17 LAB — CBC WITH DIFFERENTIAL/PLATELET
Basophils Absolute: 0 10*3/uL (ref 0.0–0.2)
Basos: 1 %
EOS (ABSOLUTE): 0 10*3/uL (ref 0.0–0.4)
Eos: 1 %
Hematocrit: 36.5 % (ref 34.0–46.6)
Hemoglobin: 11.9 g/dL (ref 11.1–15.9)
Immature Grans (Abs): 0 10*3/uL (ref 0.0–0.1)
Immature Granulocytes: 0 %
Lymphocytes Absolute: 1.5 10*3/uL (ref 0.7–3.1)
Lymphs: 42 %
MCH: 32.4 pg (ref 26.6–33.0)
MCHC: 32.6 g/dL (ref 31.5–35.7)
MCV: 100 fL — ABNORMAL HIGH (ref 79–97)
Monocytes Absolute: 0.4 10*3/uL (ref 0.1–0.9)
Monocytes: 10 %
Neutrophils Absolute: 1.7 10*3/uL (ref 1.4–7.0)
Neutrophils: 46 %
Platelets: 292 10*3/uL (ref 150–450)
RBC: 3.67 x10E6/uL — ABNORMAL LOW (ref 3.77–5.28)
RDW: 12.8 % (ref 11.7–15.4)
WBC: 3.7 10*3/uL (ref 3.4–10.8)

## 2024-01-17 NOTE — Progress Notes (Signed)
 CBC and CMP are normal.

## 2024-02-16 ENCOUNTER — Other Ambulatory Visit: Payer: Self-pay | Admitting: Rheumatology

## 2024-02-16 DIAGNOSIS — L409 Psoriasis, unspecified: Secondary | ICD-10-CM

## 2024-02-16 DIAGNOSIS — Z79899 Other long term (current) drug therapy: Secondary | ICD-10-CM

## 2024-02-16 DIAGNOSIS — L4050 Arthropathic psoriasis, unspecified: Secondary | ICD-10-CM

## 2024-02-18 ENCOUNTER — Other Ambulatory Visit: Payer: Medicare HMO

## 2024-02-18 ENCOUNTER — Ambulatory Visit (HOSPITAL_BASED_OUTPATIENT_CLINIC_OR_DEPARTMENT_OTHER)
Admission: RE | Admit: 2024-02-18 | Discharge: 2024-02-18 | Disposition: A | Discharge status: Home / Self Care | Source: Ambulatory Visit | Attending: Family Medicine | Admitting: Family Medicine

## 2024-02-18 DIAGNOSIS — E2839 Other primary ovarian failure: Secondary | ICD-10-CM | POA: Insufficient documentation

## 2024-02-18 DIAGNOSIS — Z780 Asymptomatic menopausal state: Secondary | ICD-10-CM | POA: Diagnosis not present

## 2024-02-18 DIAGNOSIS — Z13820 Encounter for screening for osteoporosis: Secondary | ICD-10-CM | POA: Insufficient documentation

## 2024-02-18 NOTE — Telephone Encounter (Signed)
 Last Fill: 11/16/2023  Labs: 01/16/2024 CBC and CMP are normal.   TB Gold: 11/06/2023 Neg    Next Visit: 04/17/2024  Last Visit: 11/06/2023  IK:Ednmpjupr arthritis   Current Dose per office note 11/06/2023:  Rinvoq  15 mg 1 tablet by mouth once daily  Okay to refill Rinvoq ?

## 2024-02-25 ENCOUNTER — Other Ambulatory Visit: Payer: Self-pay | Admitting: Gastroenterology

## 2024-02-25 ENCOUNTER — Other Ambulatory Visit: Payer: Self-pay | Admitting: Rheumatology

## 2024-02-25 NOTE — Telephone Encounter (Signed)
 Last Fill: 12/17/2023  Labs: 01/16/2024  CBC and CMP are normal.   Next Visit: 04/17/2024  Last Visit: 11/06/2023  DX: Psoriatic arthritis   Current Dose per office note on 11/06/2023: Patient plans on trying methotrexate  0.5 mL sq injections once weekly for the next 2 months and if her symptoms remain stable she can try reducing to 0.4 mL sq injections once weekly.   Patient was called on 12/17/2023 and stated she is on methotrexate  0.84mL once weekly, according to note by Addie.   Okay to refill Methotrexate ?

## 2024-03-26 ENCOUNTER — Encounter: Payer: Self-pay | Admitting: Dermatology

## 2024-03-26 ENCOUNTER — Ambulatory Visit: Payer: Medicare HMO | Admitting: Dermatology

## 2024-03-26 DIAGNOSIS — L821 Other seborrheic keratosis: Secondary | ICD-10-CM | POA: Diagnosis not present

## 2024-03-26 DIAGNOSIS — Z1283 Encounter for screening for malignant neoplasm of skin: Secondary | ICD-10-CM

## 2024-03-26 DIAGNOSIS — D1801 Hemangioma of skin and subcutaneous tissue: Secondary | ICD-10-CM

## 2024-03-26 DIAGNOSIS — L4050 Arthropathic psoriasis, unspecified: Secondary | ICD-10-CM | POA: Diagnosis not present

## 2024-03-26 DIAGNOSIS — L409 Psoriasis, unspecified: Secondary | ICD-10-CM

## 2024-03-26 DIAGNOSIS — L820 Inflamed seborrheic keratosis: Secondary | ICD-10-CM

## 2024-03-26 DIAGNOSIS — L578 Other skin changes due to chronic exposure to nonionizing radiation: Secondary | ICD-10-CM

## 2024-03-26 DIAGNOSIS — Z79899 Other long term (current) drug therapy: Secondary | ICD-10-CM

## 2024-03-26 DIAGNOSIS — L304 Erythema intertrigo: Secondary | ICD-10-CM | POA: Diagnosis not present

## 2024-03-26 DIAGNOSIS — L814 Other melanin hyperpigmentation: Secondary | ICD-10-CM | POA: Diagnosis not present

## 2024-03-26 DIAGNOSIS — W908XXA Exposure to other nonionizing radiation, initial encounter: Secondary | ICD-10-CM

## 2024-03-26 DIAGNOSIS — Z7189 Other specified counseling: Secondary | ICD-10-CM

## 2024-03-26 DIAGNOSIS — D229 Melanocytic nevi, unspecified: Secondary | ICD-10-CM

## 2024-03-26 MED ORDER — KETOCONAZOLE 2 % EX CREA: TOPICAL_CREAM | CUTANEOUS | 3 refills | Status: AC

## 2024-03-26 NOTE — Patient Instructions (Addendum)
 Continue Methotrexate  0.6 ml, Folic acid  2 mg and Rinvoq  as directed by rheumatology.    Start Zoryve every night at bedtime to feet. Samples given. If helps will send prescription.  If Zoryve does not help will try Vtama.   Use Clobetasol  ointment for worse flares only.   Topical steroids (such as triamcinolone , fluocinolone, fluocinonide, mometasone, clobetasol , halobetasol, betamethasone, hydrocortisone ) can cause thinning and lightening of the skin if they are used for too long in the same area. Your physician has selected the right strength medicine for your problem and area affected on the body. Please use your medication only as directed by your physician to prevent side effects.    Cryotherapy Aftercare  Wash gently with soap and water everyday.   Apply Vaseline and Band-Aid daily until healed.    Apply ketoconazole  cream once to twice a day as needed to rash under breasts. Use of an absorbant powder such as Zeasorb AF powder or other OTC antifungal powder to the area daily can prevent rash recurrence.   Recommend daily broad spectrum sunscreen SPF 30+ to sun-exposed areas, reapply every 2 hours as needed. Call for new or changing lesions.  Staying in the shade or wearing long sleeves, sun glasses (UVA+UVB protection) and wide brim hats (4-inch brim around the entire circumference of the hat) are also recommended for sun protection.      Melanoma ABCDEs  Melanoma is the most dangerous type of skin cancer, and is the leading cause of death from skin disease.  You are more likely to develop melanoma if you: Have light-colored skin, light-colored eyes, or red or blond hair Spend a lot of time in the sun Tan regularly, either outdoors or in a tanning bed Have had blistering sunburns, especially during childhood Have a close family member who has had a melanoma Have atypical moles or large birthmarks  Early detection of melanoma is key since treatment is typically  straightforward and cure rates are extremely high if we catch it early.   The first sign of melanoma is often a change in a mole or a new dark spot.  The ABCDE system is a way of remembering the signs of melanoma.  A for asymmetry:  The two halves do not match. B for border:  The edges of the growth are irregular. C for color:  A mixture of colors are present instead of an even brown color. D for diameter:  Melanomas are usually (but not always) greater than 6mm - the size of a pencil eraser. E for evolution:  The spot keeps changing in size, shape, and color.  Please check your skin once per month between visits. You can use a small mirror in front and a large mirror behind you to keep an eye on the back side or your body.   If you see any new or changing lesions before your next follow-up, please call to schedule a visit.  Please continue daily skin protection including broad spectrum sunscreen SPF 30+ to sun-exposed areas, reapplying every 2 hours as needed when you're outdoors.   Staying in the shade or wearing long sleeves, sun glasses (UVA+UVB protection) and wide brim hats (4-inch brim around the entire circumference of the hat) are also recommended for sun protection.      Due to recent changes in healthcare laws, you may see results of your pathology and/or laboratory studies on MyChart before the doctors have had a chance to review them. We understand that in some cases there may  be results that are confusing or concerning to you. Please understand that not all results are received at the same time and often the doctors may need to interpret multiple results in order to provide you with the best plan of care or course of treatment. Therefore, we ask that you please give us  2 business days to thoroughly review all your results before contacting the office for clarification. Should we see a critical lab result, you will be contacted sooner.   If You Need Anything After Your Visit  If  you have any questions or concerns for your doctor, please call our main line at [phone] and press option 4 to reach your doctor's medical assistant. If no one answers, please leave a voicemail as directed and we will return your call as soon as possible. Messages left after 4 pm will be answered the following business day.   You may also send us  a message via MyChart. We typically respond to MyChart messages within 1-2 business days.  For prescription refills, please ask your pharmacy to contact our office. Our fax number is [phone].  If you have an urgent issue when the clinic is closed that cannot wait until the next business day, you can page your doctor at the number below.    Please note that while we do our best to be available for urgent issues outside of office hours, we are not available 24/7.   If you have an urgent issue and are unable to reach us , you may choose to seek medical care at your doctor's office, retail clinic, urgent care center, or emergency room.  If you have a medical emergency, please immediately call 911 or go to the emergency department.  Pager Numbers  - Dr. Hester: [phone]  - Dr. Jackquline: [phone]  - Dr. Claudene: [phone]   - Dr. Raymund: [phone]  In the event of inclement weather, please call our main line at [phone] for an update on the status of any delays or closures.  Dermatology Medication Tips: Please keep the boxes that topical medications come in in order to help keep track of the instructions about where and how to use these. Pharmacies typically print the medication instructions only on the boxes and not directly on the medication tubes.   If your medication is too expensive, please contact our office at [phone] option 4 or send us  a message through MyChart.   We are unable to tell what your co-pay for medications will be in advance as this is different depending on your insurance coverage. However, we may  be able to find a substitute medication at lower cost or fill out paperwork to get insurance to cover a needed medication.   If a prior authorization is required to get your medication covered by your insurance company, please allow us  1-2 business days to complete this process.  Drug prices often vary depending on where the prescription is filled and some pharmacies may offer cheaper prices.  The website www.goodrx.com contains coupons for medications through different pharmacies. The prices here do not account for what the cost may be with help from insurance (it may be cheaper with your insurance), but the website can give you the price if you did not use any insurance.  - You can print the associated coupon and take it with your prescription to the pharmacy.  - You may also stop by our office during regular business hours and pick up a GoodRx coupon card.  - If you need  your prescription sent electronically to a different pharmacy, notify our office through [facility] or by phone at [phone] option 4.     Si Usted Necesita Algo Despus de Su Visita  Tambin puede enviarnos un mensaje a travs de Clinical cytogeneticist. Por lo general respondemos a los mensajes de MyChart en el transcurso de 1 a 2 das hbiles.  Para renovar recetas, por favor pida a su farmacia que se ponga en contacto con nuestra oficina. Randi lakes de fax es [place] [phone].  Si tiene un asunto urgente cuando la clnica est cerrada y que no puede esperar hasta el siguiente da hbil, puede llamar/localizar a su doctor(a) al nmero que aparece a continuacin.   Por favor, tenga en cuenta que aunque hacemos todo lo posible para estar disponibles para asuntos urgentes fuera del horario de [place], no estamos disponibles las 24 horas del da, los 7 [address]  [address] de la [place].   Si tiene un problema urgente y no puede comunicarse con nosotros, puede optar por buscar atencin mdica  en el consultorio de su doctor(a), en una  clnica privada, en un centro de atencin urgente o en una sala de emergencias.  Si tiene Engineer, drilling, por favor llame inmediatamente al 911 o vaya a la sala de emergencias.  Nmeros de bper  - Dr. Hester: [phone]  - Dra. Jackquline: [phone]  - Dr. Claudene: [phone]  - Dra. Kitts: [phone]  En caso de inclemencias del [place], por favor llame a nuestra lnea principal al [phone] para una actualizacin sobre el estado de cualquier retraso o cierre.  Consejos para la medicacin en dermatologa: Por favor, guarde las cajas en las que vienen los medicamentos de uso tpico para ayudarle a seguir las instrucciones sobre dnde y cmo usarlos. Las farmacias generalmente imprimen las instrucciones del medicamento slo en las cajas y no directamente en los tubos del [place].   Si su medicamento es muy caro, por favor, pngase en contacto con landry rieger llamando al [phone] y presione la opcin 4 o envenos un mensaje a travs de Clinical cytogeneticist.   No podemos decirle cul ser su copago por los medicamentos por adelantado ya que esto es diferente dependiendo de la cobertura de su seguro. Sin embargo, es posible que podamos encontrar un medicamento sustituto a Audiological scientist un formulario para que el seguro cubra el medicamento que se considera necesario.   Si se requiere una autorizacin previa para que su compaa de seguros malta su medicamento, por favor permtanos de 1 a 2 das hbiles para completar este proceso.  Los precios de los medicamentos varan con frecuencia dependiendo del Environmental consultant de dnde se surte la receta y alguna farmacias pueden ofrecer precios ms baratos.  El sitio web www.goodrx.com tiene cupones para medicamentos de Health and safety inspector. Los precios aqu no tienen en cuenta lo que podra costar con la ayuda del seguro (puede ser ms barato con su seguro), pero el sitio web puede darle el precio si no utiliz Tourist information centre manager.  - Puede  imprimir el cupn correspondiente y llevarlo con su receta a la farmacia.  - Tambin puede pasar por nuestra oficina durante el horario de atencin regular y Education officer, museum una tarjeta de cupones de GoodRx.  - Si necesita que su receta se enve electrnicamente a una farmacia diferente, informe a nuestra oficina a travs de MyChart de [place] o por telfono llamando al [phone] y presione la opcin 4.

## 2024-03-26 NOTE — Progress Notes (Signed)
 New Patient Visit   Subjective  [name] is a 58 y.o. female who presents for the following: Skin Cancer Screening and Full Body Skin Exam. No personal Hx of skin cancer. Is on Methotrexate  and was on Xeljanz  previously. Rheumatologist recommended FBSE due to several years of immunosuppressive therapy.  Palmar/plantar pustular psoriasis. Switched to Rinvoq  in January and this has improved. Uses Clobetasol  ointment for worse/stubborn areas. Has failed Enbrel, Humira, Simponi, and failed Remicade-allergic response. On Methotrexate  0.6 ml once a week. Taking Folic acid  2 mg daily.   The patient presents for Total-Body Skin Exam (TBSE) for skin cancer screening and mole check. The patient has spots, moles and lesions to be evaluated, some may be new or changing and the patient may have concern these could be cancer.  The following portions of the chart were reviewed this encounter and updated as appropriate: medications, allergies, medical history  Review of Systems:  No other skin or systemic complaints except as noted in HPI or Assessment and Plan.  Objective  Well appearing patient in no apparent distress; mood and affect are within normal limits.  A full examination was performed including scalp, head, eyes, ears, nose, lips, neck, chest, axillae, abdomen, back, buttocks, bilateral upper extremities, bilateral lower extremities, hands, feet, fingers, toes, fingernails, and toenails. All findings within normal limits unless otherwise noted below.   Relevant physical exam findings are noted in the Assessment and Plan.  B/L thighs x16 (16) Erythematous keratotic or waxy stuck-on papule or plaque.         Assessment & Plan   SKIN CANCER SCREENING PERFORMED TODAY.  ACTINIC DAMAGE - Chronic condition, secondary to cumulative UV/sun exposure - diffuse scaly erythematous macules with underlying dyspigmentation - Recommend daily broad spectrum sunscreen SPF 30+ to sun-exposed  areas, reapply every 2 hours as needed.  - Staying in the shade or wearing long sleeves, sun glasses (UVA+UVB protection) and wide brim hats (4-inch brim around the entire circumference of the hat) are also recommended for sun protection.  - Call for new or changing lesions.  LENTIGINES, SEBORRHEIC KERATOSES, HEMANGIOMAS - Benign normal skin lesions - Benign-appearing - Call for any changes  MELANOCYTIC NEVI - Tan-brown and/or pink-flesh-colored symmetric macules and papules - Benign appearing on exam today - Observation - Call clinic for new or changing moles - Recommend daily use of broad spectrum spf 30+ sunscreen to sun-exposed areas.    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 Moderate scale at left plantar foot. 4% BSA. Chronic and persistent condition with duration or expected duration over one year. Condition is improving with treatment but not currently at goal.  Patient c/o joint pain. Followed by rheumatology.   Labs from 11/06/2023 and 01/16/2024 reviewed. TB performed 11/06/2023; negative.   Psoriasis - severe on systemic treatment.  Psoriasis is a chronic non-curable, but treatable genetic/hereditary disease that may have other systemic features affecting other organ systems such as joints (Psoriatic Arthritis).  It is linked with heart disease, inflammatory bowel disease, non-alcoholic fatty liver disease, and depression. Significant skin psoriasis and/or psoriatic arthritis may have significant symptoms and affects activities of daily activity and often benefits from systemic treatments.  These systemic treatments have some potential side effects including immunosuppression and require pre-treatment laboratory screening and periodic laboratory monitoring and periodic in person evaluation and monitoring by the attending dermatologist physician (long term medication management).   Treatment  Plan: Continue Methotrexate  0.6  ml, Folic acid  2 mg and Rinvoq  as directed by rheumatology.  Start Zoryve cream every night at bedtime to feet. Samples given. If helps will send prescription. Patient will call. Exp: 03/2026 Lot: HAYDEE  If Zoryve does not help will try Vtama.   Use Clobetasol  ointment for worse flares only.   Topical steroids (such as triamcinolone , fluocinolone, fluocinonide, mometasone, clobetasol , halobetasol, betamethasone, hydrocortisone ) can cause thinning and lightening of the skin if they are used for too long in the same area. Your physician has selected the right strength medicine for your problem and area affected on the body. Please use your medication only as directed by your physician to prevent side effects.  Long term medication management.  Patient is using long term (months to years) prescription medication  to control their dermatologic condition.  These medications require periodic monitoring to evaluate for efficacy and side effects and may require periodic laboratory monitoring.    INTERTRIGO Exam: Erythematous macerated patches in body folds Chronic and persistent condition with duration or expected duration over one year. Condition is symptomatic/ bothersome to patient. Not currently at goal. Intertrigo is a chronic recurrent rash that occurs in skin fold areas that may be associated with friction; heat; moisture; yeast; fungus; and bacteria.  It is exacerbated by increased movement / activity; sweating; and higher atmospheric temperature.  Use of an absorbant powder such as Zeasorb AF powder or other OTC antifungal powder to the area daily can prevent rash recurrence. Other options to help keep the area dry include blow drying the area after bathing or using antiperspirant products such as Duradry sweat minimizing gel. Treatment Plan: Apply ketoconazole  cream once to twice a day as needed to rash under breasts.  INFLAMED SEBORRHEIC KERATOSIS (16) B/L  thighs x16 (16) Symptomatic, irritating, patient would like treated. Destruction of lesion - B/L thighs x16 (16) Complexity: simple   Destruction method: cryotherapy   Informed consent: discussed and consent obtained   Timeout:  patient name, date of birth, surgical site, and procedure verified Lesion destroyed using liquid nitrogen: Yes   Region frozen until ice ball extended beyond lesion: Yes   Outcome: patient tolerated procedure well with no complications   Post-procedure details: wound care instructions given   Additional details:  Prior to procedure, discussed risks of blister formation, small wound, skin dyspigmentation, or rare scar following cryotherapy. Recommend Vaseline ointment to treated areas while healing.    Return in about 1 year (around 03/26/2025) for TBSE, immunosuppressive therapy, psoriasis follow up.  I, Jill Parcell, CMA, am acting as scribe for Alm Rhyme, MD.   Documentation: I have reviewed the above documentation for accuracy and completeness, and I agree with the above.  Alm Rhyme, MD

## 2024-03-31 ENCOUNTER — Ambulatory Visit (HOSPITAL_BASED_OUTPATIENT_CLINIC_OR_DEPARTMENT_OTHER)
Admission: RE | Admit: 2024-03-31 | Discharge: 2024-03-31 | Disposition: A | Discharge status: Home / Self Care | Source: Ambulatory Visit | Attending: Family Medicine | Admitting: Family Medicine

## 2024-03-31 DIAGNOSIS — Z87891 Personal history of nicotine dependence: Secondary | ICD-10-CM | POA: Diagnosis not present

## 2024-03-31 DIAGNOSIS — Z122 Encounter for screening for malignant neoplasm of respiratory organs: Secondary | ICD-10-CM | POA: Insufficient documentation

## 2024-04-03 NOTE — Progress Notes (Signed)
 Office Visit Note  Patient: [name]             Date of Birth: Mar 29, 1966           MRN: [id number]             PCP: Loreli Kins, MD Referring: Loreli Kins, MD Visit Date: 04/17/2024 Occupation: Data Unavailable  Subjective:  Medication management  History of Present Illness: [name] is a 58 y.o. female with psoriatic arthritis, psoriasis and osteoarthritis.  She returns today after her last visit in April 2025.  She states she has reduced the dose of methotrexate  to 0.2 mL subcu  weekly along with folic acid .  She continues to take Rinvoq  15 mg daily.  She reports some morning stiffness and some discomfort in her shoulders and her the right Achilles tendon.  She had no recent flares of Achilles tendinitis or plantar fasciitis.  She denies any inflammation.  She has not noticed any increased  joint swelling.  She has not noticed a flare of psoriasis.  No flares of ulcerative colitis.  Patient states she was evaluated by the dermatologist.  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    Activities of Daily Living:  Patient reports morning stiffness for 15-30 minutes.   Patient Denies nocturnal pain.  Difficulty dressing/grooming: Reports Difficulty climbing stairs: Reports Difficulty getting out of chair: Denies Difficulty using hands for taps, buttons, cutlery, and/or writing: Reports  Review of Systems  Constitutional:  Negative for fatigue.  HENT:  Negative for mouth sores and mouth dryness.   Eyes:  Negative for dryness.  Respiratory:  Negative for shortness of breath.   Cardiovascular:  Negative for chest pain and palpitations.  Gastrointestinal:  Negative for blood in stool, constipation and diarrhea.  Endocrine: Negative for increased urination.  Genitourinary:  Negative for involuntary urination.  Musculoskeletal:  Positive for joint pain, joint pain and morning stiffness. Negative for gait  problem, joint swelling, myalgias, muscle weakness, muscle tenderness and myalgias.  Skin:  Positive for hair loss. Negative for color change, rash and sensitivity to sunlight.  Allergic/Immunologic: Negative for susceptible to infections.  Neurological:  Negative for dizziness and headaches.  Hematological:  Negative for swollen glands.  Psychiatric/Behavioral:  Positive for depressed mood. Negative for sleep disturbance. The patient is not nervous/anxious.     PMFS History:  Patient Active Problem List   Diagnosis Date Noted   Upper airway resistance syndrome 11/02/2022   Coronary artery disease involving native coronary artery of native heart without angina pectoris 06/14/2020   Aortic atherosclerosis 05/06/2020   Family history of bicuspid aortic valve 05/06/2020   Vitamin D  deficiency 12/24/2019   Depression 12/24/2019   History of tobacco use 08/06/2019   Allergic rhinitis 04/19/2018   Obstructive sleep apnea syndrome, mild 04/19/2018   COPD (chronic obstructive pulmonary disease) (HCC) 03/26/2018   Rectal bleeding 01/22/2018   LLQ abdominal pain 01/22/2018   Dysphagia 01/12/2017   Spondylosis of lumbar region without myelopathy or radiculopathy 08/11/2016   Primary osteoarthritis of both knees 08/11/2016   History of ulcerative colitis 08/11/2016   History of IBS 08/11/2016   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   High risk medications (not anticoagulants) long-term use 05/13/2016   Knee pain, bilateral 05/13/2016   Anserine bursitis 05/13/2016   Chondromalacia patellae, left knee 05/13/2016   Chondromalacia patellae, right knee 05/13/2016   Psoriatic arthritis (HCC) 05/12/2016   UNSPECIFIED HYPOTHYROIDISM 08/24/2009  Essential hypertension 08/24/2009   GERD 08/24/2009   Ulcerative colitis (HCC) 08/24/2009   IRRITABLE BOWEL SYNDROME 08/24/2009   OVARIAN CYST 08/24/2009   PSORIASIS 08/24/2009   ARTHRITIS 08/24/2009   PANCREATITIS, HX OF 08/24/2009   FIBROCYSTIC BREAST  DISEASE, HX OF 08/24/2009    Past Medical History:  Diagnosis Date   Allergy     Anxiety    Arthritis    Asthma    Back pain    Cataract    both eyes  surgically repaired   COPD (chronic obstructive pulmonary disease) (HCC)    Depression    treated   Dyspnea    Fibrocystic breast disease    GERD (gastroesophageal reflux disease)    History of hiatal hernia    repaired   History of kidney stones    questionable   Hyperlipidemia    Hypertension    pt denies   Hypothyroidism    Irritable bowel syndrome    Joint pain    Osteoarthritis    Ovarian cyst    Palpitations    Pancreatitis    PONV (postoperative nausea and vomiting)    NO TROUBLE WITH CATARACT SURGERY   Psoriasis    Psoriatic arthritis (HCC) 05/12/2016   Poor response to Humira Inadequate response to Enbrel Inadequate response to Simponi   Shortness of breath    Tachycardia    Ulcerative colitis     Family History  Problem Relation Age of Onset   Breast cancer Mother 18   Heart disease Mother    Colon polyps Mother    Other Mother        pre-cancerous tumor removed   Colon cancer Mother    Hypertension Mother    Hyperlipidemia Mother    Cancer Mother    Depression Mother    Heart disease Father    Hypertension Father    Hyperlipidemia Father    Stroke Father    Alcohol abuse Father    Neuropathy Father    Crohn's disease Sister    Alcoholism Sister    Breast cancer Paternal Aunt    Breast cancer Maternal Grandmother    Colon polyps Paternal Grandmother    Crohn's disease Daughter    Irritable bowel syndrome Other    Esophageal cancer Neg Hx    Rectal cancer Neg Hx    Stomach cancer Neg Hx    Pancreatic cancer Neg Hx    Past Surgical History:  Procedure Laterality Date   ABDOMINAL HYSTERECTOMY     ANKLE RECONSTRUCTION     left   bladder tuck     CARPAL TUNNEL RELEASE     CATARACT EXTRACTION W/PHACO Right 09/11/2017   Procedure: CATARACT EXTRACTION PHACO AND INTRAOCULAR LENS PLACEMENT  (IOC);  Surgeon: Jaye Fallow, MD;  Location: [facility];  Service: Ophthalmology;  Laterality: Right;  US  00:18.5 AP% 6.9 CDE 1.29 Fluid Pack Lot # [id number]   CATARACT EXTRACTION W/PHACO Left 10/02/2017   Procedure: CATARACT EXTRACTION PHACO AND INTRAOCULAR LENS PLACEMENT (IOC);  Surgeon: Jaye Fallow, MD;  Location: [facility];  Service: Ophthalmology;  Laterality: Left;  US  00:25.9 AP% 6.9 CDE 1.80 Fluid Pack Lot # [id number]   CLOSED REDUCTION NASAL FRACTURE N/A 05/21/2018   Procedure: CLOSED REDUCTION NASAL FRACTURE;  Surgeon: Blair Mt, MD;  Location: [facility];  Service: ENT;  Laterality: N/A;  Latex sensitivity   COLONOSCOPY     EYE SURGERY     Lasik   FRACTURE SURGERY     KNEE SURGERY  left   LAPAROSCOPIC NISSEN FUNDOPLICATION N/A 01/12/2017   Procedure: LAPAROSCOPIC NISSEN TAKEDOWN AND UPPER ENDOSCOPY;  Surgeon: Gladis Cough, MD;  Location: [facility];  Service: General;  Laterality: N/A;   LEFT HEART CATH AND CORONARY ANGIOGRAPHY N/A 05/10/2020   Procedure: LEFT HEART CATH AND CORONARY ANGIOGRAPHY;  Surgeon: Claudene Victory ORN, MD;  Location: [facility];  Service: Cardiovascular;  Laterality: N/A;   NASAL SEPTUM SURGERY     NISSEN FUNDOPLICATION     PARTIAL HYSTERECTOMY     POLYPECTOMY     repair cystocele and rectocele     thyroid  ablation     TUBAL LIGATION     UPPER GASTROINTESTINAL ENDOSCOPY     UPPER GI ENDOSCOPY  01/12/2017   Procedure: UPPER GI ENDOSCOPY;  Surgeon: Gladis Cough, MD;  Location: [facility];  Service: General;;   Social History   Tobacco Use   Smoking status: Former    Current packs/day: 0.00    Average packs/day: 1 pack/day for 37.5 years (37.5 ttl pk-yrs)    Types: Cigarettes    Start date: 01/26/1972    Quit date: 07/27/2009    Years since quitting: 14.7    Passive exposure: Never   Smokeless tobacco: Never  Vaping Use   Vaping status: Never Used  Substance Use Topics   Alcohol use: Yes    Alcohol/week: 0.0  standard drinks of alcohol    Comment:  occasionally (1x/mo)   Drug use: No   Social History   Social History Narrative   Not on file     Immunization History  Administered Date(s) Administered   Influenza Inj Mdck Quad Pf 05/08/2017   Influenza,inj,Quad PF,6+ Mos 03/08/2018, 03/29/2019, 06/25/2019, 03/31/2020, 04/14/2021   Influenza-Unspecified 03/31/2020   PFIZER Comirnaty(Gray Top)Covid-19 Tri-Sucrose Vaccine 09/20/2020   PFIZER(Purple Top)SARS-COV-2 Vaccination 09/28/2019, 10/20/2019, 03/02/2020   Zoster Recombinant(Shingrix) 09/06/2020, 02/28/2021     Objective: Vital Signs: BP 105/69 (BP Location: Left Arm, Patient Position: Sitting, Cuff Size: Large)   Pulse 71   Temp 97.7 F (36.5 C)   Resp 12   Ht 5' 6 (1.676 m)   Wt 206 lb 3.2 oz (93.5 kg)   BMI 33.28 kg/m    Physical Exam Vitals and nursing note reviewed.  Constitutional:      Appearance: She is well-developed.  HENT:     Head: Normocephalic and atraumatic.  Eyes:     Conjunctiva/sclera: Conjunctivae normal.  Cardiovascular:     Rate and Rhythm: Normal rate and regular rhythm.     Heart sounds: Normal heart sounds.  Pulmonary:     Effort: Pulmonary effort is normal.     Breath sounds: Normal breath sounds.  Abdominal:     General: Bowel sounds are normal.     Palpations: Abdomen is soft.  Musculoskeletal:     Cervical back: Normal range of motion.  Lymphadenopathy:     Cervical: No cervical adenopathy.  Skin:    General: Skin is warm and dry.     Capillary Refill: Capillary refill takes less than 2 seconds.  Neurological:     Mental Status: She is alert and oriented to person, place, and time.  Psychiatric:        Behavior: Behavior normal.      Musculoskeletal Exam: Cervical, thoracic and lumbar spine were in good range of motion.  There was no SI joint tenderness.  Shoulder joints, elbow joints, wrist joints, MCPs, PIPs and DIPs were in good range of motion with no synovitis.  Hip  joints  and knee joints were in good range of motion without any warmth swelling or effusion.  There was no tenderness over ankles or MTPs.   CDAI Exam: CDAI Score: -- Patient Global: --; Provider Global: -- Swollen: --; Tender: -- Joint Exam 04/17/2024   No joint exam has been documented for this visit   There is currently no information documented on the homunculus. Go to the Rheumatology activity and complete the homunculus joint exam.  Investigation: No additional findings.  Imaging: CT CHEST LUNG CA SCREEN LOW DOSE W/O CM Result Date: 04/07/2024 CLINICAL DATA:  58 year old female former smoker with 37 pack-year smoking history. Lung cancer screening. EXAM: CT CHEST WITHOUT CONTRAST LOW-DOSE FOR LUNG CANCER SCREENING TECHNIQUE: Multidetector CT imaging of the chest was performed following the standard protocol without IV contrast. RADIATION DOSE REDUCTION: This exam was performed according to the departmental dose-optimization program which includes automated exposure control, adjustment of the mA and/or kV according to patient size and/or use of iterative reconstruction technique. COMPARISON:  CT chest dated 03/30/2023 FINDINGS: Cardiovascular: Normal heart size. No significant pericardial fluid/thickening. Great vessels are normal in course and caliber. Coronary artery calcifications and aortic atherosclerosis. Mediastinum/Nodes: Imaged thyroid  gland without nodules meeting criteria for imaging follow-up by size. Normal esophagus. Unchanged surgical clips near the gastroesophageal junction. No pathologically enlarged axillary, supraclavicular, mediastinal, or hilar lymph nodes. Lungs/Pleura: The central airways are patent. Mild centrilobular emphysema. No focal consolidation. Scattered pulmonary nodules measuring up to 3.4 mm in the right lower lobe (4:161). No pneumothorax. No pleural effusion. Upper abdomen: Normal. Musculoskeletal: No acute or abnormal lytic or blastic osseous lesions. IMPRESSION:  1. Lung-RADS 2, benign appearance or behavior. Continue annual screening with low-dose chest CT without contrast in 12 months. 2. Aortic Atherosclerosis (ICD10-I70.0) and Emphysema (ICD10-J43.9). Coronary artery calcifications. Assessment for potential risk factor modification, dietary therapy or pharmacologic therapy may be warranted, if clinically indicated. Electronically Signed   By: Limin  Xu M.D.   On: 04/07/2024 15:15    Recent Labs: Lab Results  Component Value Date   WBC 3.7 01/16/2024   HGB 11.9 01/16/2024   PLT 292 01/16/2024   NA 139 01/16/2024   K 5.2 01/16/2024   CL 101 01/16/2024   CO2 22 01/16/2024   GLUCOSE 98 01/16/2024   BUN 14 01/16/2024   CREATININE 0.95 01/16/2024   BILITOT 0.9 01/16/2024   ALKPHOS 48 01/16/2024   AST 28 01/16/2024   ALT 25 01/16/2024   PROT 6.8 01/16/2024   ALBUMIN 4.0 01/16/2024   CALCIUM 9.6 01/16/2024   GFRAA 86 08/10/2020   QFTBGOLDPLUS NEGATIVE 11/06/2023    Speciality Comments: Inadequate response to Enbrel, Humira, Simponi,  Remicade-allergic response  Procedures:  No procedures performed Allergies: Demerol [meperidine], Imuran [azathioprine], Meperidine hcl, Remicade [infliximab], Sulfasalazine, Avelox [moxifloxacin], Doxycycline, Erythromycin, and Latex   Assessment / Plan:     Visit Diagnoses: Psoriatic arthritis (HCC) - Intermittent joint pain and chronic left SI joint pain: Patient states that she has been doing well on Rinvoq  15 mg p.o. daily.  She decreased the dose of methotrexate  to 0.2 mL weekly after the last visit but has not noticed any difference in her symptoms.  I advised her to discontinue methotrexate .  She has occasional discomfort in her shoulders and her right Achilles tendon region.  She has not had any episodes of Achilles tendinitis or plantar fasciitis.  No synovitis was noted on the examination today.  No dactylitis was noted.  There was no tenderness over plantar fascia  or Achilles  tendon.  Psoriasis-patient states that she was evaluated by dermatologist for plantar pustular psoriasis.  She was given a prescription for Roflumilast 0.15% cream  High risk medication use - Rinvoq  15 mg 1 tablet by mouth once daily, methotrexate  0.2 ml sq once weekly, folic acid  2 mg po daily. -Advised patient to discontinue methotrexate  and folic acid .  January 16, 2024 CBC and CMP were normal.  November 06, 2023 TB Gold was negative.  Will check labs today and every 3 months to monitor for drug toxicity.  Plan: Comprehensive metabolic panel with GFR, CBC with Differential/Platelet.  Information reimmunization was placed in the AVS.  She was advised to hold Rinvoq  if she develops an infection resume after the infection resolves.  FDA blackbox warning regarding Debby was also discussed and information was placed in the AVS.  Annual skin examination to screen for skin cancer was advised.  Patient states she had recent evaluation by the dermatologist.  Use of sunscreen and sun protection was discussed.  Raynaud's phenomenon without gangrene-currently not symptomatic.  S/p bilateral carpal tunnel release - By Dr. Sissy May 2023.  Primary osteoarthritis of both knees-she denies any discomfort in the knee joints.  Achilles tendonitis, bilateral-not symptomatic.  Lumbar spondylosis-she reports intermittent discomfort in her lower back and left SI joint.  History of gastroesophageal reflux (GERD)  Other ulcerative colitis with other complication (HCC) - Followed by Dr. Shila.  Patient had colonoscopy in June 2024.  Asymptomatic.  History of IBS-asymptomatic.  Primary hypertension-blood pressure was normal today at 105/69.  Mixed hyperlipidemia -she is on Crestor 20 mg p.o. daily.  Plan: Lipid panel.  Increased risk of heart disease with psoriatic arthritis was discussed.  Weight loss diet and exercise was discussed.  Elevated hemoglobin A1c-she is on Wegovy.  Patient states she has not had much  weight loss recently.  BMI 32.28  Vitamin D  deficiency  Osteoporosis screening - February 18, 2024 DEXA scan normal  Orders: Orders Placed This Encounter  Procedures   Comprehensive metabolic panel with GFR   CBC with Differential/Platelet   Lipid panel   No orders of the defined types were placed in this encounter.    Follow-Up Instructions: Return in about 5 months (around 09/15/2024) for Psoriatic arthritis.   Maya Nash, MD  Note - This record has been created using Animal nutritionist.  Chart creation errors have been sought, but may not always  have been located. Such creation errors do not reflect on  the standard of medical care.

## 2024-04-08 ENCOUNTER — Other Ambulatory Visit: Payer: Self-pay

## 2024-04-08 DIAGNOSIS — Z122 Encounter for screening for malignant neoplasm of respiratory organs: Secondary | ICD-10-CM

## 2024-04-08 DIAGNOSIS — Z87891 Personal history of nicotine dependence: Secondary | ICD-10-CM

## 2024-04-17 ENCOUNTER — Encounter: Payer: Self-pay | Admitting: Rheumatology

## 2024-04-17 ENCOUNTER — Ambulatory Visit: Attending: Rheumatology | Admitting: Rheumatology

## 2024-04-17 VITALS — BP 105/69 | HR 71 | Temp 97.7°F | Resp 12 | Ht 66.0 in | Wt 206.2 lb

## 2024-04-17 DIAGNOSIS — Z8719 Personal history of other diseases of the digestive system: Secondary | ICD-10-CM

## 2024-04-17 DIAGNOSIS — M47816 Spondylosis without myelopathy or radiculopathy, lumbar region: Secondary | ICD-10-CM | POA: Diagnosis not present

## 2024-04-17 DIAGNOSIS — L4050 Arthropathic psoriasis, unspecified: Secondary | ICD-10-CM

## 2024-04-17 DIAGNOSIS — Z79899 Other long term (current) drug therapy: Secondary | ICD-10-CM | POA: Diagnosis not present

## 2024-04-17 DIAGNOSIS — Z8639 Personal history of other endocrine, nutritional and metabolic disease: Secondary | ICD-10-CM

## 2024-04-17 DIAGNOSIS — L409 Psoriasis, unspecified: Secondary | ICD-10-CM

## 2024-04-17 DIAGNOSIS — M7661 Achilles tendinitis, right leg: Secondary | ICD-10-CM | POA: Diagnosis not present

## 2024-04-17 DIAGNOSIS — Z6832 Body mass index (BMI) 32.0-32.9, adult: Secondary | ICD-10-CM

## 2024-04-17 DIAGNOSIS — M170 Bilateral primary osteoarthritis of knee: Secondary | ICD-10-CM | POA: Diagnosis not present

## 2024-04-17 DIAGNOSIS — Z9889 Other specified postprocedural states: Secondary | ICD-10-CM

## 2024-04-17 DIAGNOSIS — E782 Mixed hyperlipidemia: Secondary | ICD-10-CM

## 2024-04-17 DIAGNOSIS — I10 Essential (primary) hypertension: Secondary | ICD-10-CM

## 2024-04-17 DIAGNOSIS — M7662 Achilles tendinitis, left leg: Secondary | ICD-10-CM

## 2024-04-17 DIAGNOSIS — I7300 Raynaud's syndrome without gangrene: Secondary | ICD-10-CM | POA: Diagnosis not present

## 2024-04-17 DIAGNOSIS — K51818 Other ulcerative colitis with other complication: Secondary | ICD-10-CM

## 2024-04-17 DIAGNOSIS — Z8679 Personal history of other diseases of the circulatory system: Secondary | ICD-10-CM

## 2024-04-17 DIAGNOSIS — Z13820 Encounter for screening for osteoporosis: Secondary | ICD-10-CM

## 2024-04-17 DIAGNOSIS — R7309 Other abnormal glucose: Secondary | ICD-10-CM

## 2024-04-17 DIAGNOSIS — E559 Vitamin D deficiency, unspecified: Secondary | ICD-10-CM

## 2024-04-17 LAB — CBC WITH DIFFERENTIAL/PLATELET
Absolute Lymphocytes: 1329 {cells}/uL (ref 850–3900)
Absolute Monocytes: 630 {cells}/uL (ref 200–950)
Basophils Absolute: 32 {cells}/uL (ref 0–200)
Basophils Relative: 0.5 %
Eosinophils Absolute: 19 {cells}/uL (ref 15–500)
Eosinophils Relative: 0.3 %
HCT: 36.6 % (ref 35.0–45.0)
Hemoglobin: 12.5 g/dL (ref 11.7–15.5)
MCH: 34 pg — ABNORMAL HIGH (ref 27.0–33.0)
MCHC: 34.2 g/dL (ref 32.0–36.0)
MCV: 99.5 fL (ref 80.0–100.0)
MPV: 10.1 fL (ref 7.5–12.5)
Monocytes Relative: 10 %
Neutro Abs: 4290 {cells}/uL (ref 1500–7800)
Neutrophils Relative %: 68.1 %
Platelets: 284 Thousand/uL (ref 140–400)
RBC: 3.68 Million/uL — ABNORMAL LOW (ref 3.80–5.10)
RDW: 12.4 % (ref 11.0–15.0)
Total Lymphocyte: 21.1 %
WBC: 6.3 Thousand/uL (ref 3.8–10.8)

## 2024-04-17 LAB — COMPREHENSIVE METABOLIC PANEL WITH GFR
AG Ratio: 1.6 (calc) (ref 1.0–2.5)
ALT: 29 U/L (ref 6–29)
AST: 33 U/L (ref 10–35)
Albumin: 4.2 g/dL (ref 3.6–5.1)
Alkaline phosphatase (APISO): 39 U/L (ref 37–153)
BUN: 11 mg/dL (ref 7–25)
CO2: 30 mmol/L (ref 20–32)
Calcium: 9.3 mg/dL (ref 8.6–10.4)
Chloride: 103 mmol/L (ref 98–110)
Creat: 0.85 mg/dL (ref 0.50–1.03)
Globulin: 2.6 g/dL (ref 1.9–3.7)
Glucose, Bld: 80 mg/dL (ref 65–139)
Potassium: 4.4 mmol/L (ref 3.5–5.3)
Sodium: 140 mmol/L (ref 135–146)
Total Bilirubin: 0.9 mg/dL (ref 0.2–1.2)
Total Protein: 6.8 g/dL (ref 6.1–8.1)
eGFR: 79 mL/min/1.73m2 (ref >=60)

## 2024-04-17 NOTE — Patient Instructions (Signed)
 Standing Labs We placed an order today for your standing lab work.   Please have your standing labs drawn in January and every 3 months  Please have your labs drawn 2 weeks prior to your appointment so that the provider can discuss your lab results at your appointment, if possible.  Please note that you may see your imaging and lab results in MyChart before we have reviewed them. We will contact you once all results are reviewed. Please allow our office up to 72 hours to thoroughly review all of the results before contacting the office for clarification of your results.  WALK-IN LAB HOURS  Monday through Thursday from 8:00 am -12:30 pm and 1:00 pm-4:30 pm and Friday from 8:00 am-12:00 pm.  Patients with office visits requiring labs will be seen before walk-in labs.  You may encounter longer than normal wait times. Please allow additional time. Wait times may be shorter on  Monday and Thursday afternoons.  We do not book appointments for walk-in labs. We appreciate your patience and understanding with our staff.   Labs are drawn by [facility]. Please bring your co-pay at the time of your lab draw.  You may receive a bill from [facility] for your lab work.  Please note if you are on Hydroxychloroquine and and an order has been placed for a Hydroxychloroquine level,  you will need to have it drawn 4 hours or more after your last dose.  If you wish to have your labs drawn at another location, please call the office 24 hours in advance so we can fax the orders.  The office is located at [address], [place], [place] [address]   If you have any questions regarding directions or hours of operation,  please call [phone].   As a reminder, please drink plenty of water prior to coming for your lab work. Thanks!   Vaccines You are taking a medication(s) that can suppress your immune system.  The following immunizations are recommended: Flu annually Covid-19  RSV Td/Tdap (tetanus,  diphtheria, pertussis) every 10 years Pneumonia (Prevnar 15 then Pneumovax 23 at least 1 year apart.  Alternatively, can take Prevnar 20 without needing additional dose) Shingrix: 2 doses from 4 weeks to 6 months apart  Please check with your PCP to make sure you are up to date.   If you have signs or symptoms of an infection or start antibiotics: First, call your PCP for workup of your infection. Hold your medication through the infection, until you complete your antibiotics, and until symptoms resolve if you take the following: Injectable medication (Actemra, Benlysta, Cimzia, Cosentyx, Enbrel, Humira, Kevzara, Orencia, Remicade, Simponi, Stelara, Taltz, Tremfya) Methotrexate  Leflunomide (Arava) Mycophenolate (Cellcept) Xeljanz , Olumiant, or Rinvoq    Because you are taking Xeljanz , Rinvoq , or Olumiant, it is very important to know that this class of medications has a FDA BOXED WARNING for major adverse cardiovascular events (MACE), thrombosis, mortality (including sudden cardiovascular death), serious infections, and lymphomas. MACE is defined as cardiovascular death, myocardial infarction, and stroke. Thrombosis includes deep venous thrombosis (DVT), pulmonary embolism (PE), and arterial thrombosis. If you are a current or former smoker, you are at higher risk for MACE.    Please get an annual skin examination to screen for skin cancer while you are on Rinvoq .  Please use sunscreen and sun protection.

## 2024-04-18 ENCOUNTER — Ambulatory Visit: Payer: Self-pay | Admitting: Rheumatology

## 2024-04-18 ENCOUNTER — Other Ambulatory Visit: Payer: Self-pay | Admitting: Emergency Medicine

## 2024-04-18 MED ORDER — ALBUTEROL SULFATE HFA 108 (90 BASE) MCG/ACT IN AERS: 2.0000 | INHALATION_SPRAY | Freq: Four times a day (QID) | RESPIRATORY_TRACT | 3 refills | Status: AC | PRN

## 2024-04-18 NOTE — Progress Notes (Signed)
 CBC and CMP are stable.

## 2024-04-18 NOTE — Telephone Encounter (Signed)
 Copied from CRM #[id number]. Topic: Clinical - Medication Refill >> Apr 18, 2024 10:08 AM Joesph PARAS wrote: Medication:  albuterol  (VENTOLIN  HFA) 108 (90 Base) MCG/ACT inhaler   Has the patient contacted their pharmacy? Yes - Pharmacy contacting us   This is the patient's preferred pharmacy:  [facility] - [place], [place] - [address] [place] [address] Phone: [phone] Fax: [phone]  Is this the correct pharmacy for this prescription? Yes If no, delete pharmacy and type the correct one.   Has the prescription been filled recently? No  Is the patient out of the medication? No  Has the patient been seen for an appointment in the last year OR does the patient have an upcoming appointment? Yes  Can we respond through MyChart? Yes  Agent: Please be advised that Rx refills may take up to 3 business days. We ask that you follow-up with your pharmacy.

## 2024-04-21 ENCOUNTER — Encounter: Payer: Self-pay | Admitting: Gastroenterology

## 2024-05-03 ENCOUNTER — Other Ambulatory Visit: Payer: Self-pay | Admitting: Physician Assistant

## 2024-05-03 DIAGNOSIS — Z79899 Other long term (current) drug therapy: Secondary | ICD-10-CM

## 2024-05-03 DIAGNOSIS — L4050 Arthropathic psoriasis, unspecified: Secondary | ICD-10-CM

## 2024-05-03 DIAGNOSIS — L409 Psoriasis, unspecified: Secondary | ICD-10-CM

## 2024-05-05 NOTE — Telephone Encounter (Signed)
 Last Fill: 02/18/2024  Labs: 04/17/2024 CBC and CMP are stable.   TB Gold: 11/06/2023 negative    Next Visit: 09/18/2024  Last Visit: 04/17/2024  DX: Psoriatic arthritis   Current Dose per office note on 04/17/2024: Rinvoq  15 mg 1 tablet by mouth once daily   Okay to refill Rinvoq ?

## 2024-05-23 DIAGNOSIS — K5190 Ulcerative colitis, unspecified, without complications: Secondary | ICD-10-CM | POA: Diagnosis not present

## 2024-05-23 DIAGNOSIS — L4050 Arthropathic psoriasis, unspecified: Secondary | ICD-10-CM | POA: Diagnosis not present

## 2024-05-23 DIAGNOSIS — J449 Chronic obstructive pulmonary disease, unspecified: Secondary | ICD-10-CM | POA: Diagnosis not present

## 2024-05-23 DIAGNOSIS — Z0000 Encounter for general adult medical examination without abnormal findings: Secondary | ICD-10-CM | POA: Diagnosis not present

## 2024-05-23 DIAGNOSIS — E7800 Pure hypercholesterolemia, unspecified: Secondary | ICD-10-CM | POA: Diagnosis not present

## 2024-05-23 DIAGNOSIS — I2510 Atherosclerotic heart disease of native coronary artery without angina pectoris: Secondary | ICD-10-CM | POA: Diagnosis not present

## 2024-05-23 DIAGNOSIS — E039 Hypothyroidism, unspecified: Secondary | ICD-10-CM | POA: Diagnosis not present

## 2024-05-23 DIAGNOSIS — F322 Major depressive disorder, single episode, severe without psychotic features: Secondary | ICD-10-CM | POA: Diagnosis not present

## 2024-05-23 DIAGNOSIS — R7303 Prediabetes: Secondary | ICD-10-CM | POA: Diagnosis not present

## 2024-05-23 DIAGNOSIS — I10 Essential (primary) hypertension: Secondary | ICD-10-CM | POA: Diagnosis not present

## 2024-05-23 LAB — LAB REPORT - SCANNED
A1c: 5.9
Creatinine, POC: 146 mg/dL
Microalb Creat Ratio: 4.8
Microalbumin, Urine: 0.7
TSH: 0.09 — AB (ref 0.41–5.90)

## 2024-06-04 ENCOUNTER — Ambulatory Visit: Admitting: Emergency Medicine

## 2024-06-04 ENCOUNTER — Encounter: Payer: Self-pay | Admitting: Emergency Medicine

## 2024-06-04 VITALS — BP 122/84 | HR 73 | Ht 66.0 in | Wt 201.6 lb

## 2024-06-04 DIAGNOSIS — G4733 Obstructive sleep apnea (adult) (pediatric): Secondary | ICD-10-CM | POA: Diagnosis not present

## 2024-06-04 DIAGNOSIS — Z87891 Personal history of nicotine dependence: Secondary | ICD-10-CM | POA: Diagnosis not present

## 2024-06-04 DIAGNOSIS — J449 Chronic obstructive pulmonary disease, unspecified: Secondary | ICD-10-CM

## 2024-06-04 DIAGNOSIS — K219 Gastro-esophageal reflux disease without esophagitis: Secondary | ICD-10-CM

## 2024-06-04 DIAGNOSIS — G478 Other sleep disorders: Secondary | ICD-10-CM

## 2024-06-04 DIAGNOSIS — J301 Allergic rhinitis due to pollen: Secondary | ICD-10-CM | POA: Diagnosis not present

## 2024-06-04 NOTE — Assessment & Plan Note (Signed)
 Continue PPI.

## 2024-06-04 NOTE — Progress Notes (Signed)
 Subjective:    Patient ID: [name], female    DOB: July 13, 1966, 58 y.o.   MRN: [id number]  COPD She complains of shortness of breath and wheezing. There is no cough. Associated symptoms include postnasal drip. Pertinent negatives include no ear pain, fever, headaches, rhinorrhea, sneezing or trouble swallowing. Her past medical history is significant for COPD.    ROV 06/04/2024 -58 year old woman with history of COPD and chronic cough due to this as well as upper airway irritation syndrome, GERD, rhinitis.  We have been treating her with PPI, Singulair, Xyzal, fluticasone  nasal spray.  She is not on any scheduled BD therapy, has albuterol  which she uses prn. She has mild OSA, didn't tolerate CPAP at that time.  Today she reports that she has been doing fairly well - she does get some SOB w hills, stairs. She will use albuterol  occasionally - about 2-3x a week, does help her some. She has lost about 28 lbs over the last year. Minimal cough or wheeze, no changes in her voice quality.   Lung cancer screening CT scan of the chest done 03/31/2024 reviewed by me shows some mild centrilobular emphysematous change, scattered small pulmonary nodules up to 3.4 mm in the right lower lobe that are unchanged.  This was a RADS 2 study with plan for a 12-month follow-up.   Review of Systems  Constitutional:  Negative for fever and unexpected weight change.  HENT:  Positive for postnasal drip. Negative for congestion, dental problem, ear pain, nosebleeds, rhinorrhea, sinus pressure, sneezing and trouble swallowing.   Eyes:  Negative for redness and itching.  Respiratory:  Positive for shortness of breath and wheezing. Negative for cough and chest tightness.   Cardiovascular:  Negative for palpitations and leg swelling.  Gastrointestinal:  Negative for nausea and vomiting.  Genitourinary:  Negative for dysuria.  Musculoskeletal:  Negative for joint swelling.  Skin:  Negative for rash.   Allergic/Immunologic: Negative.  Negative for environmental allergies, food allergies and immunocompromised state.  Neurological:  Negative for headaches.  Hematological:  Does not bruise/bleed easily.  Psychiatric/Behavioral:  Negative for dysphoric mood. The patient is not nervous/anxious.        Objective:   Physical Exam Vitals:   06/04/24 1114  BP: 122/84  Pulse: 73  SpO2: 99%  Weight: 201 lb 9.6 oz (91.4 kg)  Height: 5' 6 (1.676 m)   Gen: Pleasant, overwt woman, in no distress,  normal affect  ENT: No lesions,  mouth clear,  oropharynx clear, no postnasal drip  Neck: No JVD, no stridor  Lungs: No use of accessory muscles, no wheeze.  She does have an end expiratory squeak on the left on a forced expiration.  Cardiovascular: RRR, heart sounds normal, no murmur or gallops, no peripheral edema  Musculoskeletal: No deformities, no cyanosis or clubbing  Neuro: alert, non focal  Skin: Warm, no lesions or rash      Assessment & Plan:  COPD (chronic obstructive pulmonary disease) (HCC) Overall doing quite well.  No flares.  Occasional albuterol  use depending on level of exertion.  She does get benefit.  We can consider starting scheduled BD therapy going forward depending on how symptoms evolve.  Flu shot is up-to-date.  Allergic rhinitis Continue her current regimen of Xyzal, Singulair, fluticasone  nasal spray.  She has had good control, less cough  GERD Continue PPI  Upper airway resistance syndrome Continue to aggressively manage her GERD and chronic rhinitis.  She has benefited from this and her upper  airway irritation has improved  Obstructive sleep apnea syndrome, mild She tried CPAP based on this diagnosis but was unable to tolerate, did not see much benefit so is no longer using.  History of tobacco use She quit smoking in 2011 so should qualify for lung cancer screening CT chest for at least 1 more year.  We will plan to get this in September  2026.    Lamar Chris, MD, PhD 06/04/2024, 11:38 AM [facility] [phone] or if no answer [phone]

## 2024-06-04 NOTE — Patient Instructions (Addendum)
 I am glad that you have been doing well. Please keep your albuterol  available to use 2 puffs if needed for shortness of breath, chest tightness, wheezing. We can hold off on starting any maintenance inhaler medication at this time Flu shot is up-to-date Continue to work on your exercise and conditioning.  Congratulations on your weight loss. Continue your omeprazole  as you have been taking Continue your Singulair, Xyzal and fluticasone  nasal spray as you have been using them. You should get your repeat lung cancer screening CT scan of the chest in September 2026 as planned. Follow with Dr. Shelah in 1 year, sooner if you have any problems.

## 2024-06-04 NOTE — Assessment & Plan Note (Signed)
 She tried CPAP based on this diagnosis but was unable to tolerate, did not see much benefit so is no longer using.

## 2024-06-04 NOTE — Assessment & Plan Note (Signed)
 Overall doing quite well.  No flares.  Occasional albuterol  use depending on level of exertion.  She does get benefit.  We can consider starting scheduled BD therapy going forward depending on how symptoms evolve.  Flu shot is up-to-date.

## 2024-06-04 NOTE — Assessment & Plan Note (Signed)
 Continue to aggressively manage her GERD and chronic rhinitis.  She has benefited from this and her upper airway irritation has improved

## 2024-06-04 NOTE — Assessment & Plan Note (Signed)
 Continue her current regimen of Xyzal, Singulair, fluticasone  nasal spray.  She has had good control, less cough

## 2024-06-04 NOTE — Assessment & Plan Note (Signed)
 She quit smoking in 2011 so should qualify for lung cancer screening CT chest for at least 1 more year.  We will plan to get this in September 2026.

## 2024-07-15 ENCOUNTER — Other Ambulatory Visit: Payer: Self-pay | Admitting: Physician Assistant

## 2024-07-15 DIAGNOSIS — L409 Psoriasis, unspecified: Secondary | ICD-10-CM

## 2024-07-15 DIAGNOSIS — L4050 Arthropathic psoriasis, unspecified: Secondary | ICD-10-CM

## 2024-07-15 DIAGNOSIS — Z79899 Other long term (current) drug therapy: Secondary | ICD-10-CM

## 2024-07-15 NOTE — Telephone Encounter (Signed)
 Last Fill: 05/05/2024   Labs: 04/17/2024 CBC and CMP are stable.    TB Gold: 11/06/2023 negative     Next Visit: 09/18/2024   Last Visit: 04/17/2024   DX: Psoriatic arthritis    Current Dose per office note on 04/17/2024: Rinvoq  15 mg 1 tablet by mouth once daily    Okay to refill Rinvoq ?

## 2024-07-24 ENCOUNTER — Encounter: Payer: Self-pay | Admitting: Rheumatology

## 2024-07-24 DIAGNOSIS — Z79899 Other long term (current) drug therapy: Secondary | ICD-10-CM

## 2024-07-24 DIAGNOSIS — E782 Mixed hyperlipidemia: Secondary | ICD-10-CM

## 2024-07-24 NOTE — Addendum Note (Signed)
 Addended by: CENA ALFONSO CROME on: 07/24/2024 12:45 PM   Modules accepted: Orders

## 2024-07-26 LAB — LIPID PANEL
Chol/HDL Ratio: 2.3 ratio (ref 0.0–4.4)
Cholesterol, Total: 155 mg/dL (ref 100–199)
HDL: 67 mg/dL
LDL Chol Calc (NIH): 73 mg/dL (ref 0–99)
Triglycerides: 78 mg/dL (ref 0–149)
VLDL Cholesterol Cal: 15 mg/dL (ref 5–40)

## 2024-07-26 LAB — CBC WITH DIFFERENTIAL/PLATELET
Basophils Absolute: 0 x10E3/uL (ref 0.0–0.2)
Basos: 0 %
EOS (ABSOLUTE): 0 x10E3/uL (ref 0.0–0.4)
Eos: 1 %
Hematocrit: 38.5 % (ref 34.0–46.6)
Hemoglobin: 12.7 g/dL (ref 11.1–15.9)
Immature Grans (Abs): 0 x10E3/uL (ref 0.0–0.1)
Immature Granulocytes: 0 %
Lymphocytes Absolute: 1.4 x10E3/uL (ref 0.7–3.1)
Lymphs: 22 %
MCH: 33.8 pg — ABNORMAL HIGH (ref 26.6–33.0)
MCHC: 33 g/dL (ref 31.5–35.7)
MCV: 102 fL — ABNORMAL HIGH (ref 79–97)
Monocytes Absolute: 0.5 x10E3/uL (ref 0.1–0.9)
Monocytes: 8 %
Neutrophils Absolute: 4.4 x10E3/uL (ref 1.4–7.0)
Neutrophils: 69 %
Platelets: 295 x10E3/uL (ref 150–450)
RBC: 3.76 x10E6/uL — ABNORMAL LOW (ref 3.77–5.28)
RDW: 12.7 % (ref 11.7–15.4)
WBC: 6.4 x10E3/uL (ref 3.4–10.8)

## 2024-07-26 LAB — CMP14+EGFR
ALT: 30 IU/L (ref 0–32)
AST: 31 IU/L (ref 0–40)
Albumin: 4.5 g/dL (ref 3.8–4.9)
Alkaline Phosphatase: 43 IU/L — ABNORMAL LOW (ref 49–135)
BUN/Creatinine Ratio: 21 (ref 9–23)
BUN: 18 mg/dL (ref 6–24)
Bilirubin Total: 0.7 mg/dL (ref 0.0–1.2)
CO2: 23 mmol/L (ref 20–29)
Calcium: 9.6 mg/dL (ref 8.7–10.2)
Chloride: 100 mmol/L (ref 96–106)
Creatinine, Ser: 0.87 mg/dL (ref 0.57–1.00)
Globulin, Total: 3 g/dL (ref 1.5–4.5)
Glucose: 92 mg/dL (ref 70–99)
Potassium: 4.9 mmol/L (ref 3.5–5.2)
Sodium: 140 mmol/L (ref 134–144)
Total Protein: 7.5 g/dL (ref 6.0–8.5)
eGFR: 77 mL/min/1.73

## 2024-07-27 NOTE — Progress Notes (Signed)
 CBC and CMP are stable.  MCV is mildly elevated.  Lipid panel is normal.  No change in treatment advised.

## 2024-09-18 ENCOUNTER — Ambulatory Visit: Admitting: Physician Assistant

## 2024-09-30 ENCOUNTER — Ambulatory Visit: Admitting: Gastroenterology

## 2025-03-26 ENCOUNTER — Ambulatory Visit: Admitting: Dermatology
